# Patient Record
Sex: Female | Born: 1945 | Hispanic: No | Marital: Married | Smoking: Never smoker
Health system: Southern US, Community
[De-identification: ages and names within clinical notes are randomized; demographics above are authoritative.]

## PROBLEM LIST (undated history)

## (undated) DIAGNOSIS — E559 Vitamin D deficiency, unspecified: Secondary | ICD-10-CM

## (undated) DIAGNOSIS — Z8719 Personal history of other diseases of the digestive system: Secondary | ICD-10-CM

## (undated) DIAGNOSIS — N189 Chronic kidney disease, unspecified: Secondary | ICD-10-CM

## (undated) DIAGNOSIS — J309 Allergic rhinitis, unspecified: Secondary | ICD-10-CM

## (undated) DIAGNOSIS — G459 Transient cerebral ischemic attack, unspecified: Secondary | ICD-10-CM

## (undated) DIAGNOSIS — I639 Cerebral infarction, unspecified: Secondary | ICD-10-CM

## (undated) DIAGNOSIS — G1221 Amyotrophic lateral sclerosis: Secondary | ICD-10-CM

## (undated) DIAGNOSIS — K219 Gastro-esophageal reflux disease without esophagitis: Secondary | ICD-10-CM

## (undated) DIAGNOSIS — K635 Polyp of colon: Secondary | ICD-10-CM

## (undated) DIAGNOSIS — E785 Hyperlipidemia, unspecified: Secondary | ICD-10-CM

## (undated) DIAGNOSIS — Z923 Personal history of irradiation: Secondary | ICD-10-CM

## (undated) DIAGNOSIS — M1990 Unspecified osteoarthritis, unspecified site: Secondary | ICD-10-CM

## (undated) DIAGNOSIS — C801 Malignant (primary) neoplasm, unspecified: Secondary | ICD-10-CM

## (undated) DIAGNOSIS — R0600 Dyspnea, unspecified: Secondary | ICD-10-CM

## (undated) HISTORY — DX: Hyperlipidemia, unspecified: E78.5

## (undated) HISTORY — PX: BREAST SURGERY: SHX581

## (undated) HISTORY — DX: Vitamin D deficiency, unspecified: E55.9

## (undated) HISTORY — DX: Chronic kidney disease, unspecified: N18.9

## (undated) HISTORY — DX: Polyp of colon: K63.5

## (undated) HISTORY — PX: COLONOSCOPY: SHX5424

## (undated) HISTORY — DX: Allergic rhinitis, unspecified: J30.9

## (undated) HISTORY — PX: CHOLECYSTECTOMY: SHX55

## (undated) HISTORY — PX: APPENDECTOMY: SHX54

## (undated) HISTORY — DX: Malignant (primary) neoplasm, unspecified: C80.1

## (undated) HISTORY — DX: Transient cerebral ischemic attack, unspecified: G45.9

---

## 2004-12-24 HISTORY — PX: BREAST LUMPECTOMY: SHX2

## 2012-03-17 DIAGNOSIS — Z8669 Personal history of other diseases of the nervous system and sense organs: Secondary | ICD-10-CM | POA: Diagnosis not present

## 2012-03-17 DIAGNOSIS — Z0000 Encounter for general adult medical examination without abnormal findings: Secondary | ICD-10-CM | POA: Diagnosis not present

## 2012-03-17 DIAGNOSIS — E559 Vitamin D deficiency, unspecified: Secondary | ICD-10-CM | POA: Diagnosis not present

## 2012-03-17 DIAGNOSIS — Z136 Encounter for screening for cardiovascular disorders: Secondary | ICD-10-CM | POA: Diagnosis not present

## 2012-03-17 DIAGNOSIS — R209 Unspecified disturbances of skin sensation: Secondary | ICD-10-CM | POA: Diagnosis not present

## 2012-03-17 DIAGNOSIS — Z1211 Encounter for screening for malignant neoplasm of colon: Secondary | ICD-10-CM | POA: Diagnosis not present

## 2012-03-17 DIAGNOSIS — K219 Gastro-esophageal reflux disease without esophagitis: Secondary | ICD-10-CM | POA: Diagnosis not present

## 2012-03-17 DIAGNOSIS — M542 Cervicalgia: Secondary | ICD-10-CM | POA: Diagnosis not present

## 2012-03-17 DIAGNOSIS — J309 Allergic rhinitis, unspecified: Secondary | ICD-10-CM | POA: Diagnosis not present

## 2012-03-17 DIAGNOSIS — G459 Transient cerebral ischemic attack, unspecified: Secondary | ICD-10-CM | POA: Diagnosis not present

## 2012-04-18 DIAGNOSIS — M542 Cervicalgia: Secondary | ICD-10-CM | POA: Diagnosis not present

## 2012-04-18 DIAGNOSIS — M545 Low back pain: Secondary | ICD-10-CM | POA: Diagnosis not present

## 2012-04-25 DIAGNOSIS — M542 Cervicalgia: Secondary | ICD-10-CM | POA: Diagnosis not present

## 2012-04-29 DIAGNOSIS — M545 Low back pain: Secondary | ICD-10-CM | POA: Diagnosis not present

## 2012-05-01 DIAGNOSIS — M545 Low back pain: Secondary | ICD-10-CM | POA: Diagnosis not present

## 2012-05-02 DIAGNOSIS — M545 Low back pain: Secondary | ICD-10-CM | POA: Diagnosis not present

## 2012-05-05 DIAGNOSIS — M545 Low back pain: Secondary | ICD-10-CM | POA: Diagnosis not present

## 2012-05-07 DIAGNOSIS — M545 Low back pain: Secondary | ICD-10-CM | POA: Diagnosis not present

## 2012-05-12 DIAGNOSIS — M545 Low back pain: Secondary | ICD-10-CM | POA: Diagnosis not present

## 2012-05-16 DIAGNOSIS — M545 Low back pain: Secondary | ICD-10-CM | POA: Diagnosis not present

## 2012-05-16 DIAGNOSIS — M542 Cervicalgia: Secondary | ICD-10-CM | POA: Diagnosis not present

## 2012-05-20 DIAGNOSIS — M545 Low back pain: Secondary | ICD-10-CM | POA: Diagnosis not present

## 2012-05-23 DIAGNOSIS — M545 Low back pain: Secondary | ICD-10-CM | POA: Diagnosis not present

## 2012-05-27 DIAGNOSIS — M545 Low back pain: Secondary | ICD-10-CM | POA: Diagnosis not present

## 2012-05-30 DIAGNOSIS — M545 Low back pain: Secondary | ICD-10-CM | POA: Diagnosis not present

## 2012-05-30 DIAGNOSIS — M542 Cervicalgia: Secondary | ICD-10-CM | POA: Diagnosis not present

## 2012-07-29 DIAGNOSIS — M545 Low back pain: Secondary | ICD-10-CM | POA: Diagnosis not present

## 2012-11-06 DIAGNOSIS — Z23 Encounter for immunization: Secondary | ICD-10-CM | POA: Diagnosis not present

## 2012-12-26 DIAGNOSIS — G459 Transient cerebral ischemic attack, unspecified: Secondary | ICD-10-CM | POA: Diagnosis not present

## 2012-12-26 DIAGNOSIS — Z1331 Encounter for screening for depression: Secondary | ICD-10-CM | POA: Diagnosis not present

## 2012-12-26 DIAGNOSIS — R944 Abnormal results of kidney function studies: Secondary | ICD-10-CM | POA: Diagnosis not present

## 2012-12-26 DIAGNOSIS — J309 Allergic rhinitis, unspecified: Secondary | ICD-10-CM | POA: Diagnosis not present

## 2012-12-26 DIAGNOSIS — K219 Gastro-esophageal reflux disease without esophagitis: Secondary | ICD-10-CM | POA: Diagnosis not present

## 2012-12-26 DIAGNOSIS — E559 Vitamin D deficiency, unspecified: Secondary | ICD-10-CM | POA: Diagnosis not present

## 2012-12-26 DIAGNOSIS — M542 Cervicalgia: Secondary | ICD-10-CM | POA: Diagnosis not present

## 2012-12-26 DIAGNOSIS — D126 Benign neoplasm of colon, unspecified: Secondary | ICD-10-CM | POA: Diagnosis not present

## 2013-01-06 DIAGNOSIS — Z86010 Personal history of colonic polyps: Secondary | ICD-10-CM | POA: Diagnosis not present

## 2013-01-06 DIAGNOSIS — K7689 Other specified diseases of liver: Secondary | ICD-10-CM | POA: Diagnosis not present

## 2013-02-12 DIAGNOSIS — D126 Benign neoplasm of colon, unspecified: Secondary | ICD-10-CM | POA: Diagnosis not present

## 2013-02-12 DIAGNOSIS — K5730 Diverticulosis of large intestine without perforation or abscess without bleeding: Secondary | ICD-10-CM | POA: Diagnosis not present

## 2013-02-12 DIAGNOSIS — Z86010 Personal history of colonic polyps: Secondary | ICD-10-CM | POA: Diagnosis not present

## 2013-02-12 DIAGNOSIS — K5520 Angiodysplasia of colon without hemorrhage: Secondary | ICD-10-CM | POA: Diagnosis not present

## 2013-02-12 DIAGNOSIS — Z09 Encounter for follow-up examination after completed treatment for conditions other than malignant neoplasm: Secondary | ICD-10-CM | POA: Diagnosis not present

## 2013-04-01 ENCOUNTER — Other Ambulatory Visit: Payer: Self-pay | Admitting: Gastroenterology

## 2013-04-02 DIAGNOSIS — M722 Plantar fascial fibromatosis: Secondary | ICD-10-CM | POA: Diagnosis not present

## 2013-04-22 ENCOUNTER — Other Ambulatory Visit: Payer: Self-pay | Admitting: Gastroenterology

## 2013-04-22 DIAGNOSIS — L814 Other melanin hyperpigmentation: Secondary | ICD-10-CM

## 2013-04-22 DIAGNOSIS — K7689 Other specified diseases of liver: Secondary | ICD-10-CM

## 2013-04-27 ENCOUNTER — Other Ambulatory Visit: Payer: Self-pay

## 2013-04-30 ENCOUNTER — Ambulatory Visit
Admission: RE | Admit: 2013-04-30 | Discharge: 2013-04-30 | Disposition: A | Discharge status: Home / Self Care | Payer: Medicare Other | Source: Ambulatory Visit | Attending: Gastroenterology | Admitting: Gastroenterology

## 2013-04-30 DIAGNOSIS — K7689 Other specified diseases of liver: Secondary | ICD-10-CM

## 2013-04-30 DIAGNOSIS — L814 Other melanin hyperpigmentation: Secondary | ICD-10-CM

## 2013-04-30 IMAGING — US US ABDOMEN LIMITED
1 series · 14 of 25 positions shown · non-contrast
Comparison: None

CLINICAL DATA: Evaluate liver cysts.  History of cholecystectomy.

GALLBLADDER ULTRASOUND

[Series 1: us abdomen limited · 0.28mm/px · 14 of 49 slices shown]
[im 1/49]
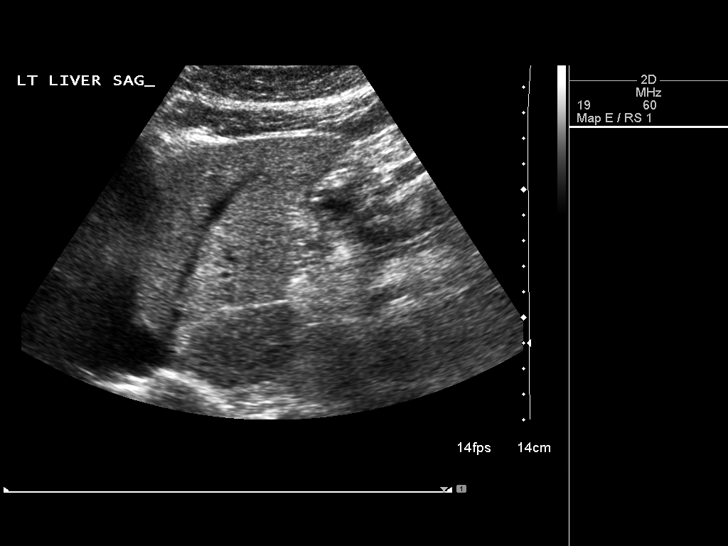
[im 5/49]
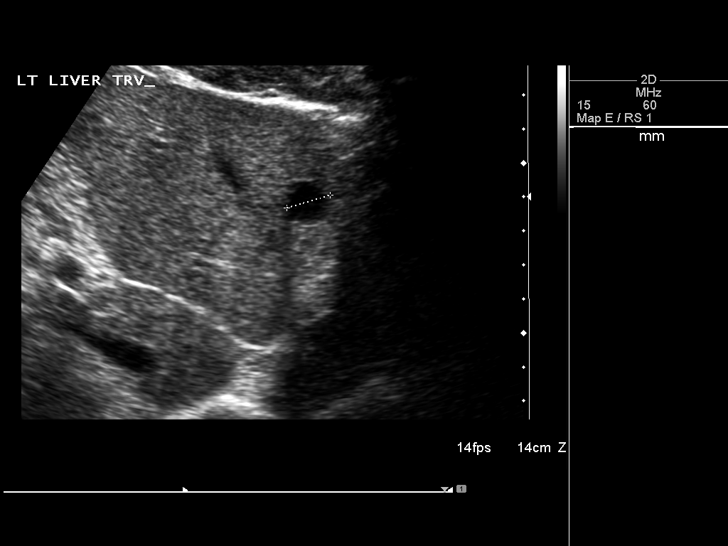
[im 9/49]
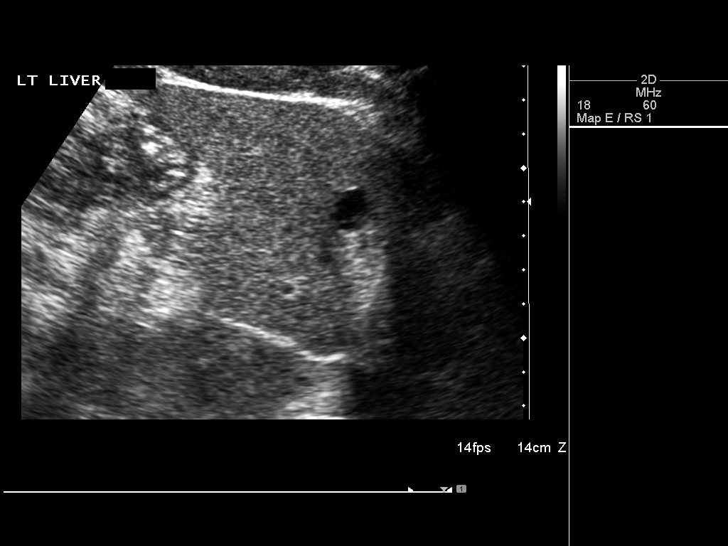
[im 13/49]
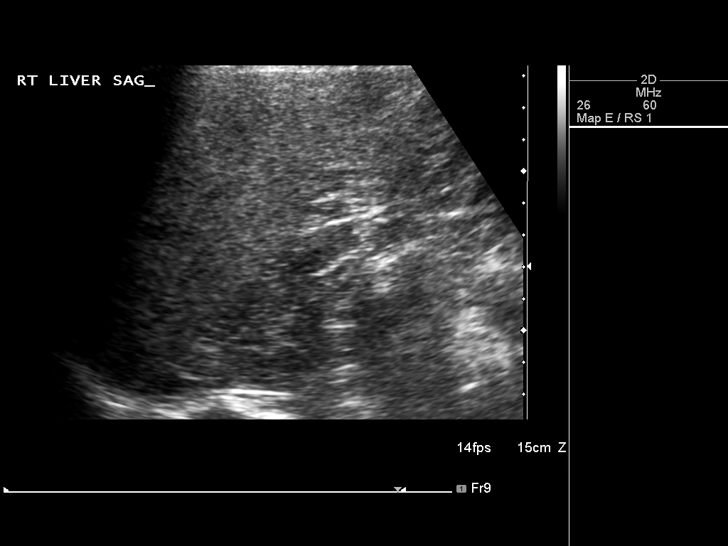
[im 17/49]
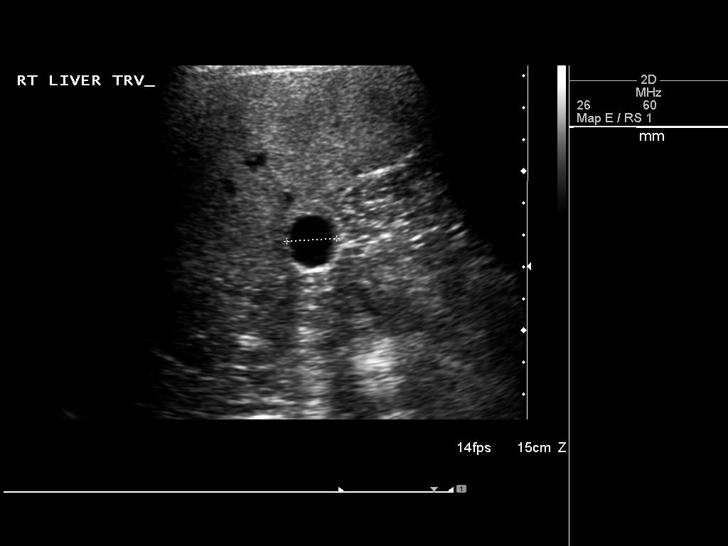
[im 19/49]
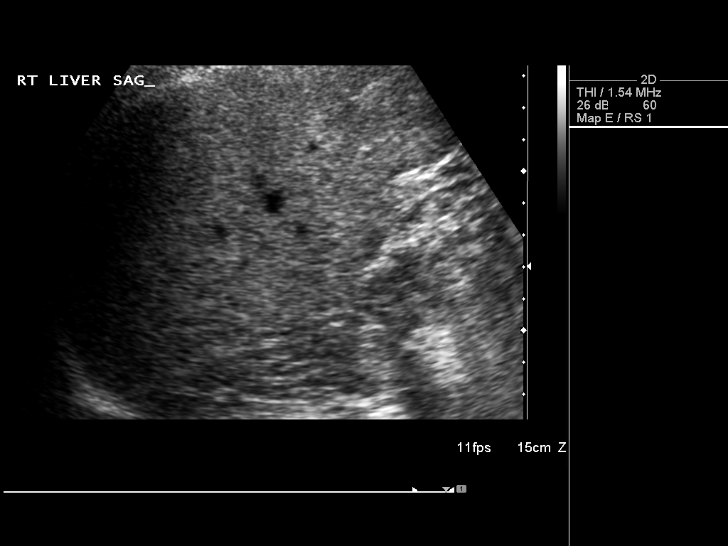
[im 23/49]
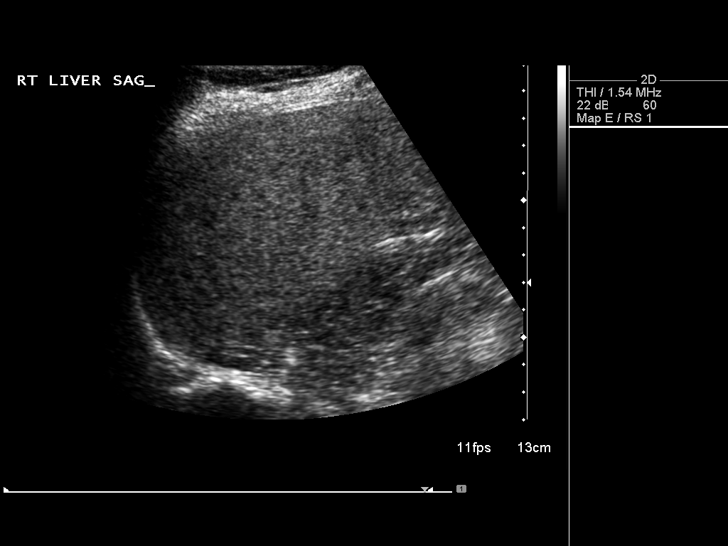
[im 27/49]
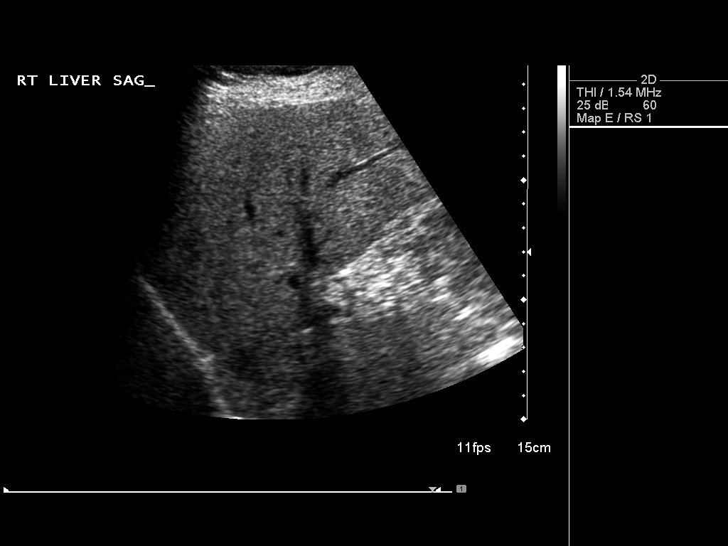
[im 31/49]
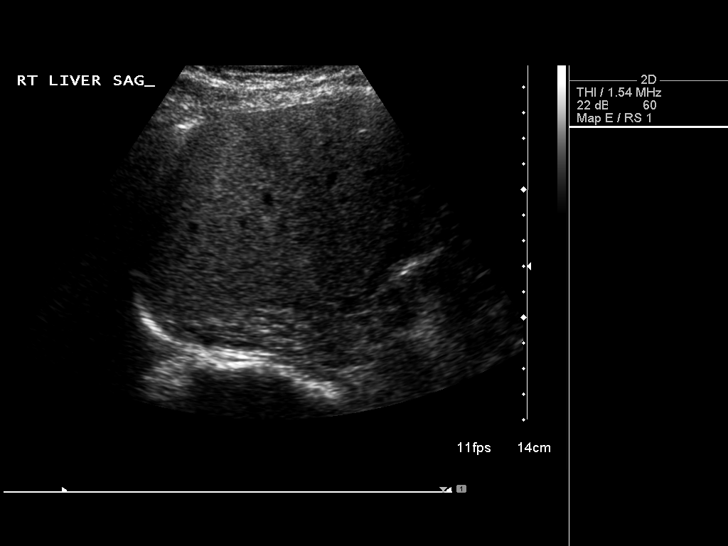
[im 33/49]
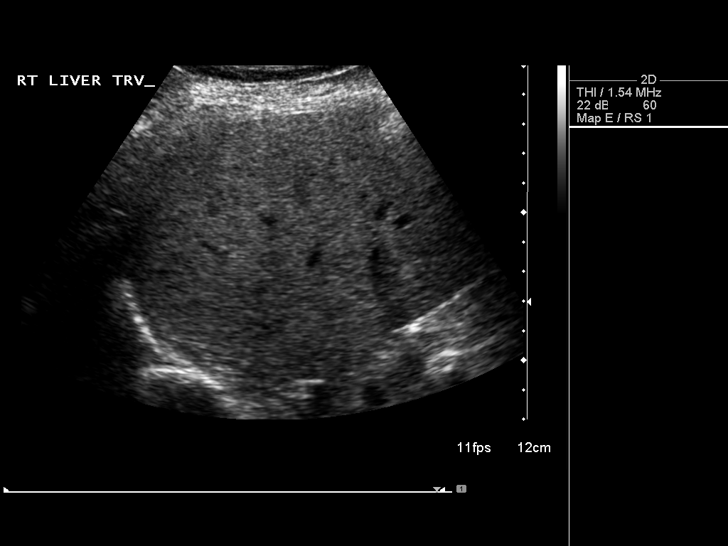
[im 37/49]
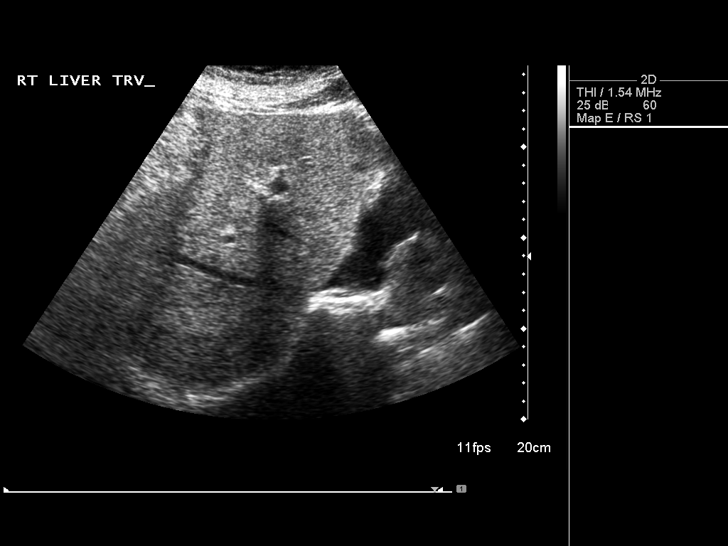
[im 41/49]
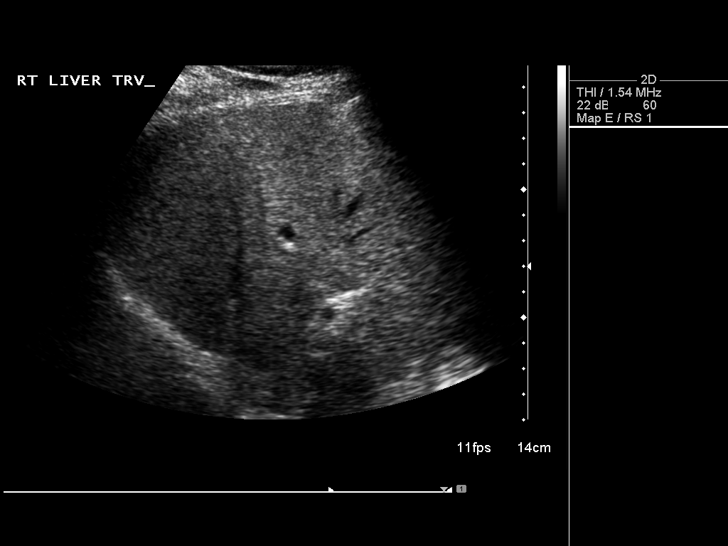
[im 45/49]
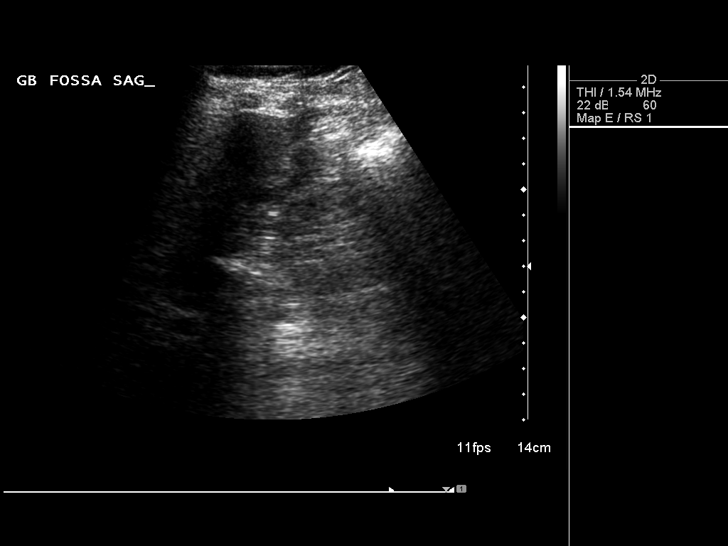
[im 49/49]
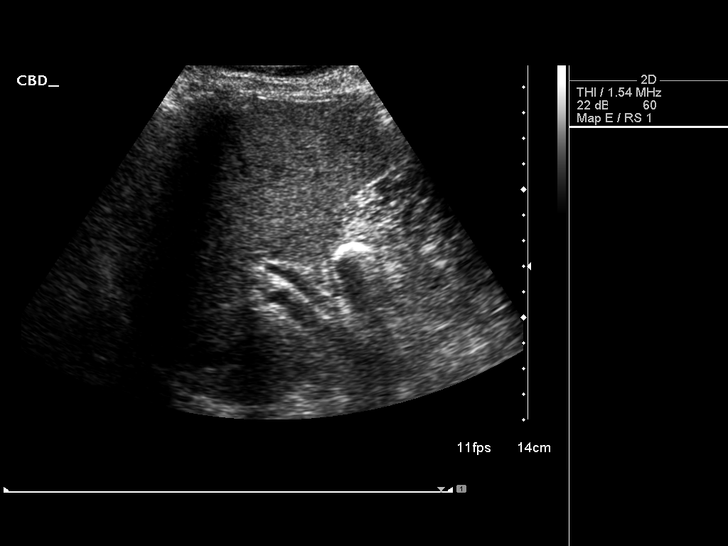

[14 of 25 positions shown; findings below may reference images not displayed]

FINDINGS: Gallbladder:  Prior cholecystectomy.

Common Bile Duct:  Within normal limits measuring 6.8 mm.

Liver:  Left hepatic lobe cyst measures 1.38 x 1.5 x 1.3 cm.
Within the right hepatic lobe there is a cyst measuring 1.8 x 1.9 x
1.6 cm.  There are no complicating features associated with these
cysts. Within normal limits in parenchymal echogenicity.
IMPRESSION: 1.  Liver cyst.
2.  Prior cholecystectomy.

## 2013-05-14 DIAGNOSIS — M722 Plantar fascial fibromatosis: Secondary | ICD-10-CM | POA: Diagnosis not present

## 2013-07-01 DIAGNOSIS — Z13820 Encounter for screening for osteoporosis: Secondary | ICD-10-CM | POA: Diagnosis not present

## 2013-07-01 DIAGNOSIS — Z8673 Personal history of transient ischemic attack (TIA), and cerebral infarction without residual deficits: Secondary | ICD-10-CM | POA: Diagnosis not present

## 2013-07-01 DIAGNOSIS — J309 Allergic rhinitis, unspecified: Secondary | ICD-10-CM | POA: Diagnosis not present

## 2013-07-01 DIAGNOSIS — E785 Hyperlipidemia, unspecified: Secondary | ICD-10-CM | POA: Diagnosis not present

## 2013-07-01 DIAGNOSIS — Z853 Personal history of malignant neoplasm of breast: Secondary | ICD-10-CM | POA: Diagnosis not present

## 2013-07-01 DIAGNOSIS — K219 Gastro-esophageal reflux disease without esophagitis: Secondary | ICD-10-CM | POA: Diagnosis not present

## 2013-07-01 DIAGNOSIS — E559 Vitamin D deficiency, unspecified: Secondary | ICD-10-CM | POA: Diagnosis not present

## 2013-08-04 DIAGNOSIS — M779 Enthesopathy, unspecified: Secondary | ICD-10-CM | POA: Diagnosis not present

## 2013-08-18 ENCOUNTER — Other Ambulatory Visit: Payer: Self-pay | Admitting: Nurse Practitioner

## 2013-08-18 ENCOUNTER — Other Ambulatory Visit (HOSPITAL_COMMUNITY)
Admission: RE | Admit: 2013-08-18 | Discharge: 2013-08-18 | Disposition: A | Discharge status: Home / Self Care | Payer: Medicare Other | Source: Ambulatory Visit | Attending: Obstetrics and Gynecology | Admitting: Obstetrics and Gynecology

## 2013-08-18 ENCOUNTER — Other Ambulatory Visit: Payer: Self-pay | Admitting: Obstetrics and Gynecology

## 2013-08-18 DIAGNOSIS — Z124 Encounter for screening for malignant neoplasm of cervix: Secondary | ICD-10-CM | POA: Diagnosis not present

## 2013-08-18 DIAGNOSIS — Z01419 Encounter for gynecological examination (general) (routine) without abnormal findings: Secondary | ICD-10-CM | POA: Diagnosis not present

## 2013-08-18 DIAGNOSIS — Z853 Personal history of malignant neoplasm of breast: Secondary | ICD-10-CM

## 2013-08-18 DIAGNOSIS — N644 Mastodynia: Secondary | ICD-10-CM

## 2013-08-18 DIAGNOSIS — Z9889 Other specified postprocedural states: Secondary | ICD-10-CM

## 2013-09-09 ENCOUNTER — Ambulatory Visit
Admission: RE | Admit: 2013-09-09 | Discharge: 2013-09-09 | Disposition: A | Discharge status: Home / Self Care | Payer: Medicare Other | Source: Ambulatory Visit | Attending: Obstetrics and Gynecology | Admitting: Obstetrics and Gynecology

## 2013-09-09 DIAGNOSIS — Z9889 Other specified postprocedural states: Secondary | ICD-10-CM

## 2013-09-09 DIAGNOSIS — Z853 Personal history of malignant neoplasm of breast: Secondary | ICD-10-CM | POA: Diagnosis not present

## 2013-09-09 DIAGNOSIS — N644 Mastodynia: Secondary | ICD-10-CM

## 2013-09-09 IMAGING — MG MM DIGITAL DIAGNOSTIC BILAT
5 series · 5 of 5 positions shown · non-contrast
Comparison: No comparison exams available.

***ADDENDUM*** CREATED: [DATE] [DATE]

The patient's prior films from [DATE] and [DATE] have
become available for comparison. There has been no change since the
prior exam. There is no mammographic evidence of malignancy in
either breast.
***END ADDENDUM*** SIGNED BY: NASARIO, M.D.
CLINICAL DATA: 66-year-old female with history of left breast
cancer post lumpectomy and radiation in [FJ].  The patient reports
nonfocal tenderness throughout the right breast.
DIGITAL DIAGNOSTIC BILATERAL MAMMOGRAM

[R CC (1 of 2)]
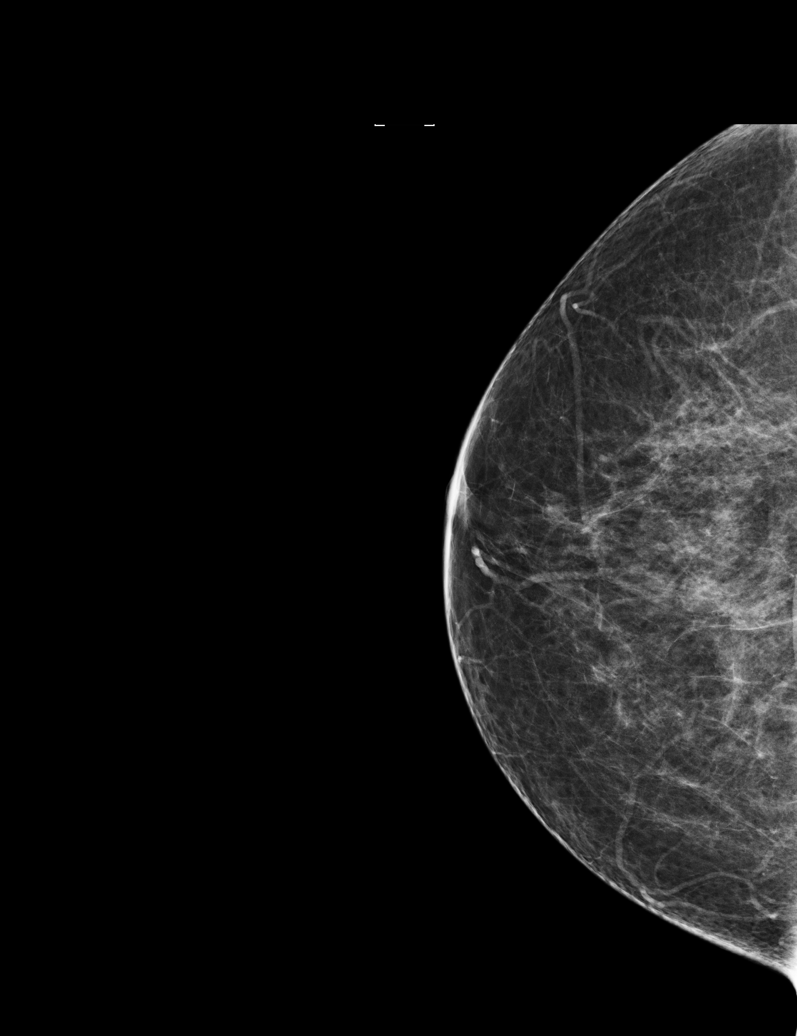

[R CC (2 of 2)]
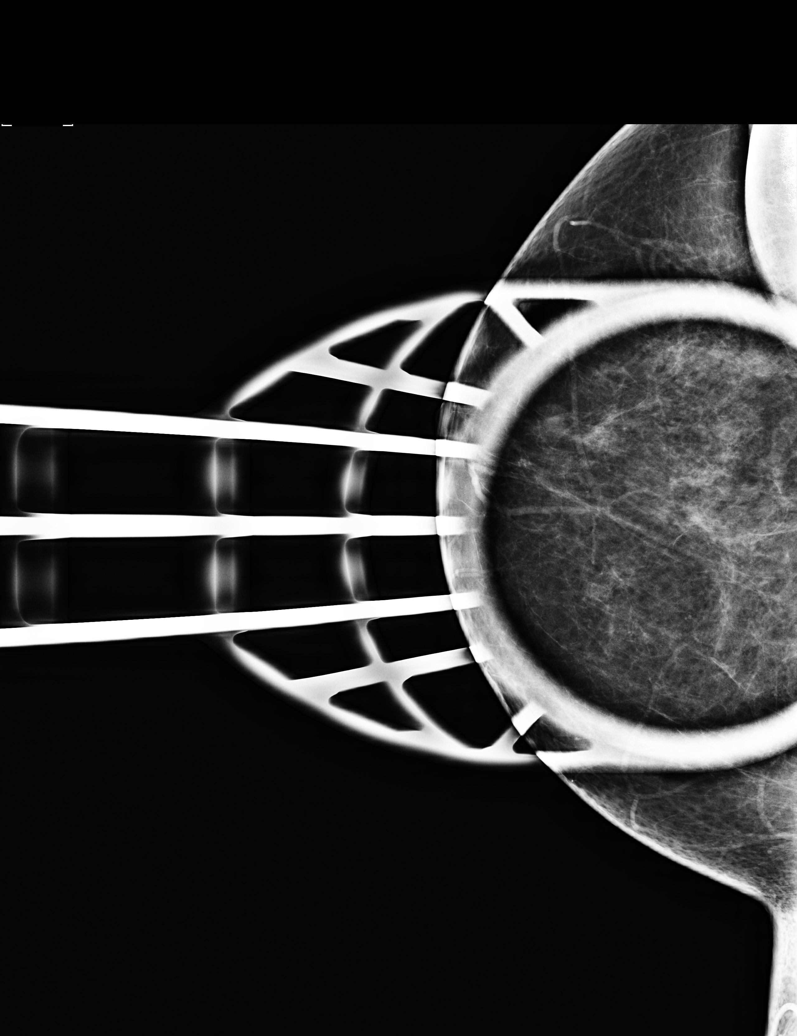

[L MLO]
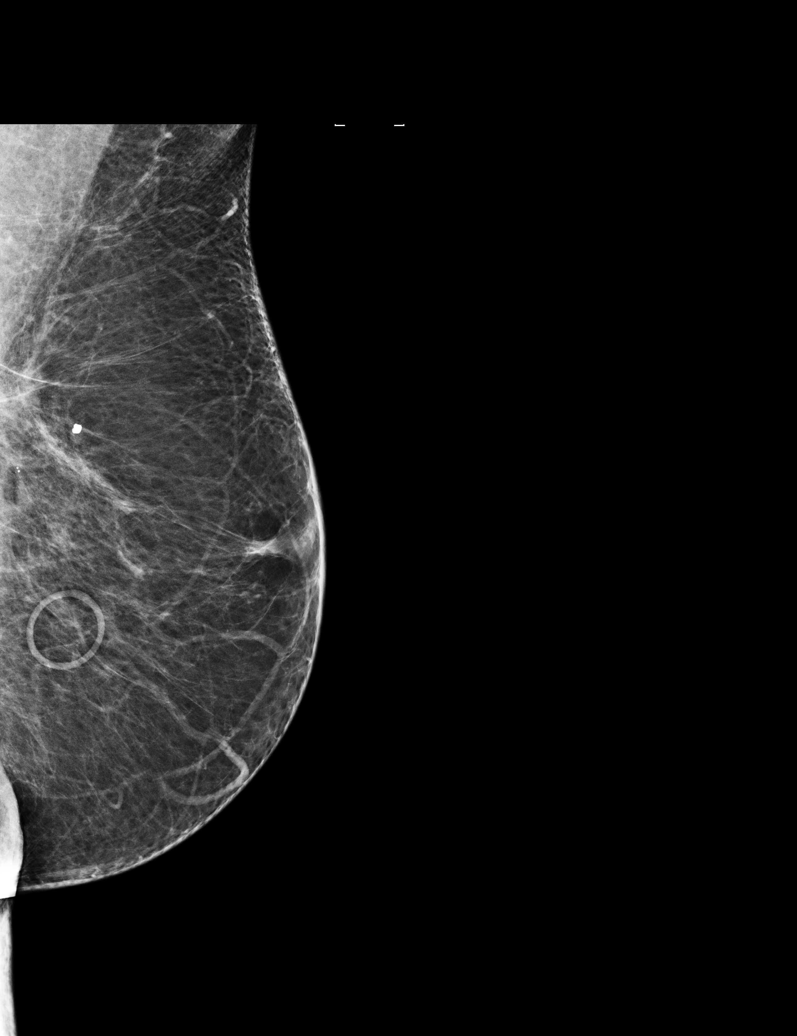

[R MLO]
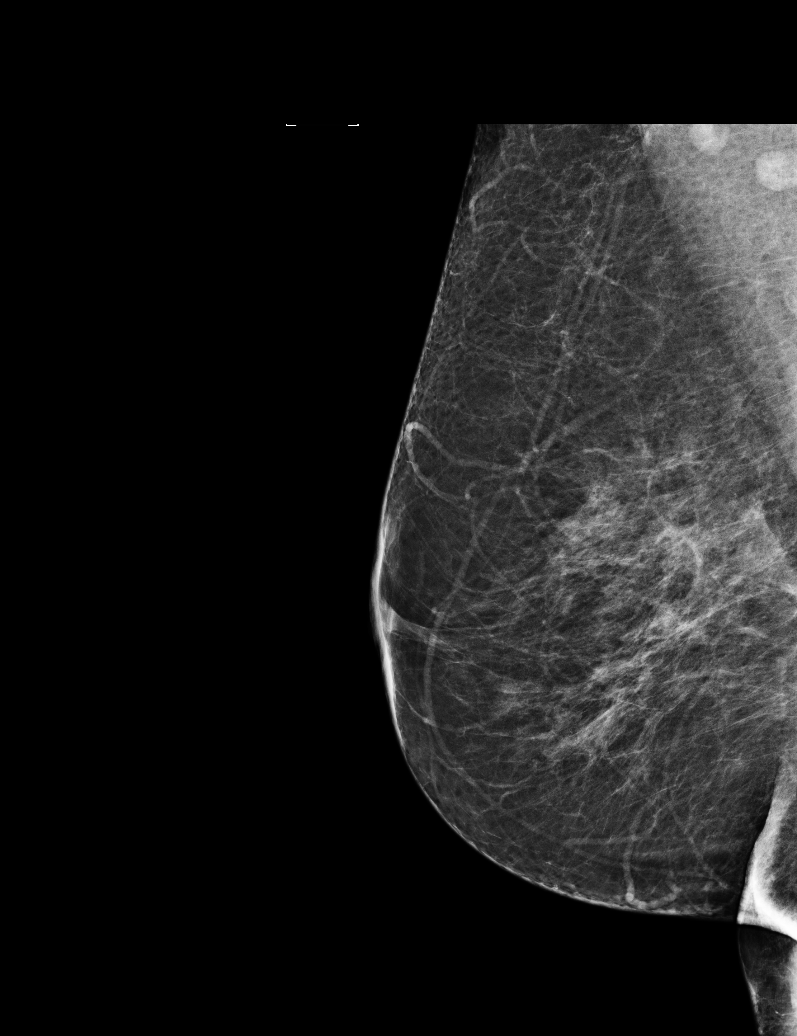

[L CC]
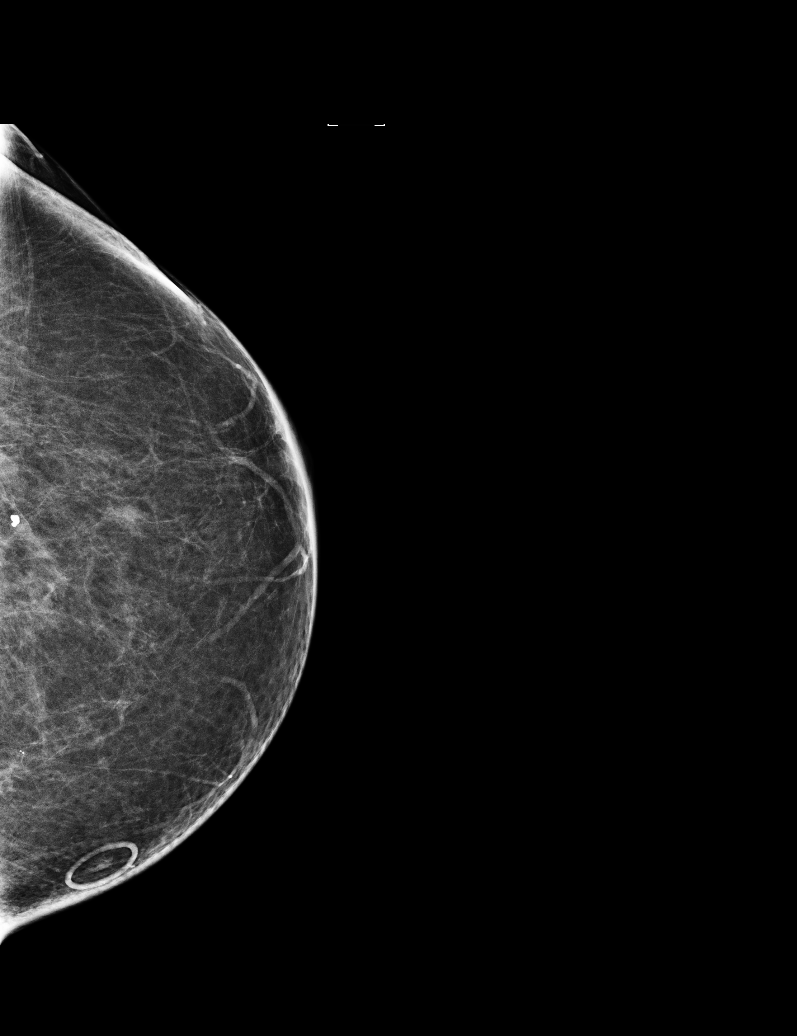

[5 of 5 positions shown; findings below may reference images not displayed]

FINDINGS: ACR Breast Density Category b:  There are scattered areas of
fibroglandular density.

There is density and architectural distortion in the superior left
breast consistent with a post lumpectomy scar.  An initially
questioned asymmetry in the right breast resolves on the additional
imaging compatible with superimposed breast tissue. There are no
suspicious masses or calcifications seen in either breast.

CAD/BLANK
IMPRESSION: Post lumpectomy changes in the left breast.  There is no
mammographic evidence of malignancy in either breast.

RECOMMENDATION:
1. Screening mammogram in one year. (Code:[FJ])
2. The patient's prior films have been requested for comparison and
if they become available an addendum will be made.

I have discussed the findings and recommendations with the patient.
Results were also provided in writing at the conclusion of the
visit.  If applicable, a reminder letter will be sent to the
patient regarding her next appointment.

BI-RADS CATEGORY 2:  Benign finding(s).

## 2013-09-17 DIAGNOSIS — Z780 Asymptomatic menopausal state: Secondary | ICD-10-CM | POA: Diagnosis not present

## 2013-10-09 DIAGNOSIS — Z23 Encounter for immunization: Secondary | ICD-10-CM | POA: Diagnosis not present

## 2013-10-26 DIAGNOSIS — E559 Vitamin D deficiency, unspecified: Secondary | ICD-10-CM | POA: Diagnosis not present

## 2013-10-26 DIAGNOSIS — Z1331 Encounter for screening for depression: Secondary | ICD-10-CM | POA: Diagnosis not present

## 2013-10-26 DIAGNOSIS — Z8673 Personal history of transient ischemic attack (TIA), and cerebral infarction without residual deficits: Secondary | ICD-10-CM | POA: Diagnosis not present

## 2013-10-26 DIAGNOSIS — K219 Gastro-esophageal reflux disease without esophagitis: Secondary | ICD-10-CM | POA: Diagnosis not present

## 2013-10-26 DIAGNOSIS — J309 Allergic rhinitis, unspecified: Secondary | ICD-10-CM | POA: Diagnosis not present

## 2013-10-26 DIAGNOSIS — Z853 Personal history of malignant neoplasm of breast: Secondary | ICD-10-CM | POA: Diagnosis not present

## 2013-10-26 DIAGNOSIS — E785 Hyperlipidemia, unspecified: Secondary | ICD-10-CM | POA: Diagnosis not present

## 2013-10-26 DIAGNOSIS — R0602 Shortness of breath: Secondary | ICD-10-CM | POA: Diagnosis not present

## 2013-11-08 ENCOUNTER — Encounter: Payer: Self-pay | Admitting: *Deleted

## 2013-11-08 ENCOUNTER — Encounter: Payer: Self-pay | Admitting: Interventional Cardiology

## 2013-11-08 DIAGNOSIS — G459 Transient cerebral ischemic attack, unspecified: Secondary | ICD-10-CM | POA: Insufficient documentation

## 2013-11-08 DIAGNOSIS — E785 Hyperlipidemia, unspecified: Secondary | ICD-10-CM | POA: Insufficient documentation

## 2013-11-08 DIAGNOSIS — J309 Allergic rhinitis, unspecified: Secondary | ICD-10-CM | POA: Insufficient documentation

## 2013-11-08 DIAGNOSIS — N189 Chronic kidney disease, unspecified: Secondary | ICD-10-CM | POA: Insufficient documentation

## 2013-11-08 DIAGNOSIS — K635 Polyp of colon: Secondary | ICD-10-CM | POA: Insufficient documentation

## 2013-11-08 DIAGNOSIS — E559 Vitamin D deficiency, unspecified: Secondary | ICD-10-CM | POA: Insufficient documentation

## 2013-11-09 ENCOUNTER — Encounter: Payer: Self-pay | Admitting: Interventional Cardiology

## 2013-11-09 ENCOUNTER — Ambulatory Visit (INDEPENDENT_AMBULATORY_CARE_PROVIDER_SITE_OTHER): Payer: Medicare Other | Admitting: Interventional Cardiology

## 2013-11-09 VITALS — BP 122/72 | HR 90 | Ht 65.0 in | Wt 177.0 lb

## 2013-11-09 DIAGNOSIS — R079 Chest pain, unspecified: Secondary | ICD-10-CM

## 2013-11-09 DIAGNOSIS — I6350 Cerebral infarction due to unspecified occlusion or stenosis of unspecified cerebral artery: Secondary | ICD-10-CM

## 2013-11-09 DIAGNOSIS — I639 Cerebral infarction, unspecified: Secondary | ICD-10-CM

## 2013-11-09 DIAGNOSIS — E785 Hyperlipidemia, unspecified: Secondary | ICD-10-CM

## 2013-11-09 NOTE — Patient Instructions (Signed)
Your physician has requested that you have en exercise stress myoview. For further information please visit [email or web address]. Please follow instruction sheet, as given.   

## 2013-11-09 NOTE — Progress Notes (Signed)
Patient ID: Yvonne Jordan, female   DOB: [birth year], 67 y.o.   MRN: [id number]     Patient ID: Yvonne Jordan MRN: [id number] DOB/AGE: June 21, 1946 67 y.o.   Referring Physician Dr. Azucena Cecil   Reason for Consultation Chest pain  HPI: 67 y/o who has a family h/o CAD.  Her sister just had a fatal MI.  The patient has had neck pain radiating to the right arm, about once a month.  It has lasted hours.She went to sleep with it and it was gone when she woke.  She has DOE with walking up one flight of stairs.  She has trouble carrying groceries into the house, due to [facility]  Not related to any pain in the chest or neck.  She had a stress test a few years ago and it was borderline.  SHOB lasts until she rests.     Current Outpatient Prescriptions  Medication Sig Dispense Refill  . Ascorbic Acid (VITAMIN C) 1000 MG tablet Take 1,000 mg by mouth daily.      . cetirizine (ZYRTEC) 10 MG tablet Take 10 mg by mouth daily.      . Cholecalciferol (VITAMIN D3) 2000 UNITS TABS Take by mouth.      . clopidogrel (PLAVIX) 75 MG tablet Take 75 mg by mouth daily with breakfast.      . Cyanocobalamin (VITAMIN B12 PO) Take 5,000 mcg by mouth.      . Multiple Vitamin (MULTIVITAMIN) capsule Take 1 capsule by mouth daily.      . pantoprazole (PROTONIX) 40 MG tablet Take 40 mg by mouth daily. For 30 days       No current facility-administered medications for this visit.   Past Medical History  Diagnosis Date  . TIA (transient ischemic attack)   . Vitamin D deficiency   . Allergic rhinitis   . CKD (chronic kidney disease)   . Colon polyp   . Hyperlipidemia     Family History  Problem Relation Age of Onset  . Cancer Sister   . Heart attack Sister   . Heart disease Father   . Stroke Father   . Heart disease Maternal Uncle   . Heart disease Maternal Uncle   . Heart disease Maternal Uncle   . Stroke Mother     History   Social History  . Marital Status: Married    Spouse Name: N/A    Number of Children:  N/A  . Years of Education: N/A   Occupational History  . Not on file.   Social History Main Topics  . Smoking status: Never Smoker   . Smokeless tobacco: Not on file  . Alcohol Use: Not on file  . Drug Use: Not on file  . Sexual Activity: Not on file   Other Topics Concern  . Not on file   Social History Narrative  . No narrative on file    Past Surgical History  Procedure Laterality Date  . Breast surgery      2006...treated with Tamoxifen for 5 years  . Cholecystectomy        (Not in a hospital admission)  Review of systems complete and found to be negative unless listed above .  No nausea, vomiting.  No fever chills, No focal weakness,  No palpitations.  Physical Exam: Filed Vitals:   11/09/13 1451  BP: 122/72  Pulse: 90    Weight: 177 lb (80.287 kg)  Physical exam:  [place]/AT EOMI No JVD, No carotid bruit RRR S1S2  No  wheezing Soft. NT, nondistended No edema. No focal motor or sensory deficits Normal affect  Labs:   No results found for this basename: WBC, HGB, HCT, MCV, PLT   No results found for this basename: NA, K, CL, CO2, BUN, CREATININE, CALCIUM, LABALBU, PROT, BILITOT, ALKPHOS, ALT, AST, GLUCOSE,  in the last 168 hours No results found for this basename: CKTOTAL, CKMB, CKMBINDEX, TROPONINI    No results found for this basename: CHOL   No results found for this basename: HDL   No results found for this basename: LDLCALC   No results found for this basename: TRIG   No results found for this basename: CHOLHDL   No results found for this basename: LDLDIRECT       EKG: NSR, NSST changes inferiorly  ASSESSMENT AND PLAN: Chest pain, TIA, high choleterol Chest pain: Several atypical features. She does have risk factors for atherosclerosis. Her ECG would not be amenable to exercise treadmill testing due to baseline changes in the inferior leads with ST depressions downsloping. We'll plan for nuclear stress test.  TIA: She is taking clopidogrel  because of this prior event. Given this, her LDL target should be less than 100.  Hyperlipidemia: LDL 141. I would strongly consider lipid-lowering therapy for this patient. Atorvastatin 10 mg daily may be sufficient to get her LDL below 100.  Signed:   Fredric Mare, MD, [facility] 11/09/2013, 3:24 PM

## 2013-12-10 ENCOUNTER — Encounter: Payer: Self-pay | Admitting: Cardiology

## 2013-12-10 ENCOUNTER — Ambulatory Visit (HOSPITAL_COMMUNITY): Payer: Medicare Other | Attending: Interventional Cardiology | Admitting: Radiology

## 2013-12-10 VITALS — BP 133/69 | HR 80 | Ht 65.0 in | Wt 175.0 lb

## 2013-12-10 DIAGNOSIS — Z8673 Personal history of transient ischemic attack (TIA), and cerebral infarction without residual deficits: Secondary | ICD-10-CM | POA: Diagnosis not present

## 2013-12-10 DIAGNOSIS — I639 Cerebral infarction, unspecified: Secondary | ICD-10-CM

## 2013-12-10 DIAGNOSIS — R079 Chest pain, unspecified: Secondary | ICD-10-CM | POA: Diagnosis not present

## 2013-12-10 DIAGNOSIS — R0602 Shortness of breath: Secondary | ICD-10-CM | POA: Diagnosis not present

## 2013-12-10 MED ORDER — TECHNETIUM TC 99M SESTAMIBI GENERIC - CARDIOLITE
33.0000 | Freq: Once | INTRAVENOUS | Status: AC | PRN
  Administered 2013-12-10: 33 via INTRAVENOUS

## 2013-12-10 MED ORDER — TECHNETIUM TC 99M SESTAMIBI GENERIC - CARDIOLITE
11.0000 | Freq: Once | INTRAVENOUS | Status: AC | PRN
  Administered 2013-12-10: 11 via INTRAVENOUS

## 2013-12-10 NOTE — Progress Notes (Signed)
[facility] SITE 3 NUCLEAR MED [address] [place], [place] [address] [phone]    Cardiology Nuclear Med Study  Yvonne Jordan is a 67 y.o. female     MRN : [id number]     DOB: 1946/09/24  Procedure Date: 12/10/2013  Nuclear Med Background Indication for Stress Test:  Evaluation for Ischemia History:  No known CAD, GXT several ago (normal) Cardiac Risk Factors: Family History - CAD, Lipids and TIA  Symptoms:  DOE   Nuclear Pre-Procedure Caffeine/Decaff Intake:  None NPO After: 7:00pm   Lungs:  clear O2 Sat: 100% on room air. IV 0.9% NS with Angio Cath:  22g  IV Site: R Hand  IV Started by:  Bonnita Levan, RN  Chest Size (in):  34 Cup Size: C  Height: 5\' 5"  (1.651 m)  Weight:  175 lb (79.379 kg)  BMI:  Body mass index is 29.12 kg/(m^2). Tech Comments:  N/A    Nuclear Med Study 1 or 2 day study: 1 day  Stress Test Type:  Stress  Reading MD: Lance Muss, MD  Order Authorizing Provider:  Lance Muss, MD  Resting Radionuclide: Technetium 37m Sestamibi  Resting Radionuclide Dose: 11.0 mCi   Stress Radionuclide:  Technetium 106m Sestamibi  Stress Radionuclide Dose: 33.0 mCi           Stress Protocol Rest HR: 80 Stress HR: 148  Rest BP: 133/69 Stress BP: 183/81  Exercise Time (min): n/a METS: n/a           Dose of Adenosine (mg):  n/a Dose of Lexiscan: n/a mg  Dose of Atropine (mg): n/a Dose of Dobutamine: n/a mcg/kg/min (at max HR)  Stress Test Technologist: Nelson Chimes, BS-ES  Nuclear Technologist:  Domenic Polite, CNMT     Rest Procedure:  Myocardial perfusion imaging was performed at rest 45 minutes following the intravenous administration of Technetium 41m Sestamibi. Rest ECG: NSR - Normal EKG  Stress Procedure:  The patient exercised on the treadmill utilizing the Bruce Protocol for 5:00 minutes. The patient stopped due to SOB and denied any chest pain.  Technetium 53m Sestamibi was injected at peak exercise and myocardial perfusion  imaging was performed after a brief delay.  Held stage I due to patient being very SOB.  Stress ECG: No significant change from baseline ECG  QPS Raw Data Images:  Normal; no motion artifact; normal heart/lung ratio. Stress Images:  Normal homogeneous uptake in all areas of the myocardium. Rest Images:  Normal homogeneous uptake in all areas of the myocardium. Subtraction (SDS):  No evidence of ischemia. Transient Ischemic Dilatation (Normal <1.22):  1.02 Lung/Heart Ratio (Normal <0.45):  0.25  Quantitative Gated Spect Images QGS EDV:  58 ml QGS ESV:  20 ml  Impression Exercise Capacity:  Fair exercise capacity. BP Response:  Hypertensive blood pressure response. Clinical Symptoms:  There is dyspnea. ECG Impression:  No significant ST segment change suggestive of ischemia. Comparison with Prior Nuclear Study: No images to compare  Overall Impression:  Low risk stress nuclear study . No clear evidence of ischemia..  LV Ejection Fraction: 65%.  LV Wall Motion:  NL LV Function; NL Wall Motion  Corky Crafts., MD, [facility]

## 2014-02-23 DIAGNOSIS — Z853 Personal history of malignant neoplasm of breast: Secondary | ICD-10-CM | POA: Diagnosis not present

## 2014-02-23 DIAGNOSIS — Z8673 Personal history of transient ischemic attack (TIA), and cerebral infarction without residual deficits: Secondary | ICD-10-CM | POA: Diagnosis not present

## 2014-02-23 DIAGNOSIS — K219 Gastro-esophageal reflux disease without esophagitis: Secondary | ICD-10-CM | POA: Diagnosis not present

## 2014-02-23 DIAGNOSIS — E559 Vitamin D deficiency, unspecified: Secondary | ICD-10-CM | POA: Diagnosis not present

## 2014-02-23 DIAGNOSIS — N183 Chronic kidney disease, stage 3 unspecified: Secondary | ICD-10-CM | POA: Diagnosis not present

## 2014-02-23 DIAGNOSIS — J309 Allergic rhinitis, unspecified: Secondary | ICD-10-CM | POA: Diagnosis not present

## 2014-02-23 DIAGNOSIS — Z23 Encounter for immunization: Secondary | ICD-10-CM | POA: Diagnosis not present

## 2014-02-23 DIAGNOSIS — E785 Hyperlipidemia, unspecified: Secondary | ICD-10-CM | POA: Diagnosis not present

## 2014-02-23 DIAGNOSIS — Z0000 Encounter for general adult medical examination without abnormal findings: Secondary | ICD-10-CM | POA: Diagnosis not present

## 2014-03-03 ENCOUNTER — Telehealth: Payer: Self-pay | Admitting: Interventional Cardiology

## 2014-03-03 DIAGNOSIS — R609 Edema, unspecified: Secondary | ICD-10-CM

## 2014-03-03 NOTE — Telephone Encounter (Signed)
Went over results with Yvonne Jordan again and faxed results to Dr. [name]. Yvonne Jordan has swelling in her right leg and ankle. Yvonne Jordan cant even wear knee highs on her right leg because it hurts. She has pain in this leg as well. Yvonne Jordan said that Dr. Irish Lack said the circulation in her right leg was not good. Her left leg is fine.

## 2014-03-03 NOTE — Telephone Encounter (Signed)
New message          Pt would like stress test results from December 2014 and also requests that a copy be sent to her primary care office.

## 2014-03-03 NOTE — Telephone Encounter (Signed)
Ok to check LE venous Doppler on swollen leg.,

## 2014-03-04 NOTE — Telephone Encounter (Signed)
Doppler scheduled for 3:30pm and pt notified if swelling gets worse she should go to ED. She states she should be fine because this has been going on for over a month now. FYI to Dr. Irish Lack.

## 2014-03-04 NOTE — Telephone Encounter (Signed)
LE Venous ordered, trying to work pt in today for study.

## 2014-03-04 NOTE — Telephone Encounter (Signed)
Spoke with pt and she is out of town until Saturday. She states swelling is better since elevating leg last night. Pt will come for doppler on Monday.

## 2014-03-08 ENCOUNTER — Encounter (HOSPITAL_COMMUNITY): Payer: Medicare Other

## 2014-03-15 ENCOUNTER — Ambulatory Visit (HOSPITAL_COMMUNITY): Payer: Medicare Other | Attending: Cardiology | Admitting: Cardiology

## 2014-03-15 DIAGNOSIS — R609 Edema, unspecified: Secondary | ICD-10-CM | POA: Diagnosis not present

## 2014-03-15 DIAGNOSIS — M79609 Pain in unspecified limb: Secondary | ICD-10-CM | POA: Diagnosis not present

## 2014-03-15 DIAGNOSIS — R0602 Shortness of breath: Secondary | ICD-10-CM | POA: Insufficient documentation

## 2014-03-15 DIAGNOSIS — M7989 Other specified soft tissue disorders: Secondary | ICD-10-CM

## 2014-03-15 NOTE — Progress Notes (Signed)
Lower venous duplex bilateral complete 

## 2014-06-01 ENCOUNTER — Encounter: Payer: Self-pay | Admitting: Podiatry

## 2014-06-01 ENCOUNTER — Ambulatory Visit (INDEPENDENT_AMBULATORY_CARE_PROVIDER_SITE_OTHER): Payer: Medicare Other | Admitting: Podiatry

## 2014-06-01 VITALS — BP 109/68 | HR 100 | Resp 16

## 2014-06-01 DIAGNOSIS — M2040 Other hammer toe(s) (acquired), unspecified foot: Secondary | ICD-10-CM

## 2014-06-01 DIAGNOSIS — Q828 Other specified congenital malformations of skin: Secondary | ICD-10-CM

## 2014-06-01 DIAGNOSIS — M898X9 Other specified disorders of bone, unspecified site: Secondary | ICD-10-CM

## 2014-06-01 NOTE — Progress Notes (Signed)
She presents today for followup of her fourth toe of her left foot. She states it hurts nothing to ready for surgical fix.  Objective: Pulses are palpable. No change in her previous physical exam. Adductovarus rotated fourth toe resulting in medial reactive hyperkeratosis overlying an exostosis to the medial base of the distal phalanx of this toe.  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  Plan: We discussed the etiology pathology conservative versus surgical therapies at this point she would like to have this surgically corrected. We went over consent form today which discusses thoroughly the procedures performed which is going to be a arthroplasty at the DIPJ fourth digit left foot with an exostectomy medial aspect of that toe. She understands this and is amenable to it. We discussed possible postop complications which may include but are not limited to possible postop pain bleeding swelling infection and need for further surgery she understands that is amenable to it will followup with [place] in the near future for surgery.

## 2014-06-01 NOTE — Patient Instructions (Signed)
Pre-Operative Instructions  Congratulations, you have decided to take an important step to improving your quality of life.  You can be assured that the doctors of [facility] will be with you every step of the way.  1. Plan to be at the surgery center/hospital at least 1 (one) hour prior to your scheduled time unless otherwise directed by the surgical center/hospital staff.  You must have a responsible adult accompany you, remain during the surgery and drive you home.  Make sure you have directions to the surgical center/hospital and know how to get there on time. 2. For hospital based surgery you will need to obtain a history and physical form from your family physician within 1 month prior to the date of surgery- we will give you a form for you primary physician.  3. We make every effort to accommodate the date you request for surgery.  There are however, times where surgery dates or times have to be moved.  We will contact you as soon as possible if a change in schedule is required.   4. No Aspirin/Ibuprofen for one week before surgery.  If you are on aspirin, any non-steroidal anti-inflammatory medications (Mobic, Aleve, Ibuprofen) you should stop taking it 7 days prior to your surgery.  You make take Tylenol  For pain prior to surgery.  5. Medications- If you are taking daily heart and blood pressure medications, seizure, reflux, allergy, asthma, anxiety, pain or diabetes medications, make sure the surgery center/hospital is aware before the day of surgery so they may notify you which medications to take or avoid the day of surgery. 6. No food or drink after midnight the night before surgery unless directed otherwise by surgical center/hospital staff. 7. No alcoholic beverages 24 hours prior to surgery.  No smoking 24 hours prior to or 24 hours after surgery. 8. Wear loose pants or shorts- loose enough to fit over bandages, boots, and casts. 9. No slip on shoes, sneakers are best. 10. Bring  your boot with you to the surgery center/hospital.  Also bring crutches or a walker if your physician has prescribed it for you.  If you do not have this equipment, it will be provided for you after surgery. 11. If you have not been contracted by the surgery center/hospital by the day before your surgery, call to confirm the date and time of your surgery. 12. Leave-time from work may vary depending on the type of surgery you have.  Appropriate arrangements should be made prior to surgery with your employer. 13. Prescriptions will be provided immediately following surgery by your doctor.  Have these filled as soon as possible after surgery and take the medication as directed. 14. Remove nail polish on the operative foot. 15. Wash the night before surgery.  The night before surgery wash the foot and leg well with the antibacterial soap provided and water paying special attention to beneath the toenails and in between the toes.  Rinse thoroughly with water and dry well with a towel.  Perform this wash unless told not to do so by your physician.  Enclosed: 1 Ice pack (please put in freezer the night before surgery)   1 Hibiclens skin cleaner   Pre-op Instructions  If you have any questions regarding the instructions, do not hesitate to call our office.  [place]: [address] [place], [place] [address] [phone]  [place]: [address] [place], [place] [address] [phone]  [place]: [address]  [place],  [address] [phone]  Dr. Richard   Tuchman DPM, Dr. Norman Regal DPM Dr. Richard Sikora DPM, Dr. M. Todd Hyatt DPM, Dr. Kathryn Egerton DPM 

## 2014-06-03 ENCOUNTER — Ambulatory Visit: Payer: Medicare Other | Admitting: Podiatry

## 2014-06-04 ENCOUNTER — Encounter: Payer: Self-pay | Admitting: *Deleted

## 2014-06-22 ENCOUNTER — Telehealth: Payer: Self-pay | Admitting: Interventional Cardiology

## 2014-06-22 NOTE — Telephone Encounter (Signed)
Spoke with pt and she will be having minor surgery on one of her toes. She will put under general anesthesia.

## 2014-06-22 NOTE — Telephone Encounter (Signed)
Follow up:        Pt needs surgical clearce faxed to  Dr [name]  [phone]  procedure date 7/31 please.

## 2014-06-23 NOTE — Telephone Encounter (Signed)
OK to hold Plavix 5 days.

## 2014-06-23 NOTE — Telephone Encounter (Signed)
Pt notified. Faxed to Dr. [name]. She also states that Dr. [name] told her to stop plavix 5 days prior. FYI.

## 2014-06-23 NOTE — Telephone Encounter (Signed)
No further cardiac evaluation needed before surgery.

## 2014-06-23 NOTE — Telephone Encounter (Signed)
Noted. Pt already aware. Faxed to Dr. [name].

## 2014-07-22 ENCOUNTER — Other Ambulatory Visit: Payer: Self-pay | Admitting: Podiatry

## 2014-07-22 ENCOUNTER — Telehealth: Payer: Self-pay | Admitting: *Deleted

## 2014-07-22 MED ORDER — OXYCODONE-ACETAMINOPHEN 10-325 MG PO TABS: 1.0000 | ORAL_TABLET | Freq: Four times a day (QID) | ORAL | Status: DC | PRN

## 2014-07-22 MED ORDER — CEPHALEXIN 500 MG PO CAPS: 500.0000 mg | ORAL_CAPSULE | Freq: Three times a day (TID) | ORAL | Status: DC

## 2014-07-22 MED ORDER — PROMETHAZINE HCL 25 MG PO TABS: 25.0000 mg | ORAL_TABLET | Freq: Three times a day (TID) | ORAL | Status: DC | PRN

## 2014-07-22 NOTE — Telephone Encounter (Signed)
Calling about procedure having tomorrow.  One of my papers said to bring things you gave me plus the thing for the foot.  I don't know what they're talking about.

## 2014-07-22 NOTE — Telephone Encounter (Signed)
I returned her call and asked how I could help her.  She stated her husband had called found out the answer.  They said I would receive the boot at the surgical center.  I told her she will probably be put into a surgical shoe instead of a boot.  She stated that's even better.

## 2014-07-23 DIAGNOSIS — M898X9 Other specified disorders of bone, unspecified site: Secondary | ICD-10-CM | POA: Diagnosis not present

## 2014-07-23 DIAGNOSIS — K219 Gastro-esophageal reflux disease without esophagitis: Secondary | ICD-10-CM | POA: Diagnosis not present

## 2014-07-23 DIAGNOSIS — M2040 Other hammer toe(s) (acquired), unspecified foot: Secondary | ICD-10-CM | POA: Diagnosis not present

## 2014-07-27 ENCOUNTER — Telehealth: Payer: Self-pay

## 2014-07-27 NOTE — Telephone Encounter (Signed)
No answer, left message

## 2014-07-29 ENCOUNTER — Encounter: Payer: Self-pay | Admitting: Podiatry

## 2014-07-29 ENCOUNTER — Ambulatory Visit (INDEPENDENT_AMBULATORY_CARE_PROVIDER_SITE_OTHER): Payer: Medicare Other | Admitting: Podiatry

## 2014-07-29 ENCOUNTER — Ambulatory Visit (INDEPENDENT_AMBULATORY_CARE_PROVIDER_SITE_OTHER): Payer: Medicare Other

## 2014-07-29 VITALS — BP 123/74 | HR 84 | Temp 97.7°F | Resp 16

## 2014-07-29 DIAGNOSIS — M2042 Other hammer toe(s) (acquired), left foot: Secondary | ICD-10-CM

## 2014-07-29 DIAGNOSIS — M2040 Other hammer toe(s) (acquired), unspecified foot: Secondary | ICD-10-CM | POA: Diagnosis not present

## 2014-07-29 DIAGNOSIS — Z9889 Other specified postprocedural states: Secondary | ICD-10-CM

## 2014-07-29 NOTE — Progress Notes (Signed)
She presents today for followup of a hammertoe repair fourth digit left foot x1 week date of surgery was July 31 thousand and 15. She denies fever chills nausea vomiting muscle aches and pains.  Objective: Vital signs are stable she is alert and oriented x3. Pulses are palpable left lower extremity after dry sterile dressing was removed. This demonstrates no erythema edema cellulitis drainage or odor. Radiographic evaluation demonstrates complete exostectomy arthroplasty fourth digit left foot.  Assessment: Well-healing surgical toe fourth left.  Plan: Redressed today dressed a compressive dressing continue the use of the Darco shoe continue to keep it dry. Followup with me in one week for suture removal.

## 2014-07-30 ENCOUNTER — Encounter: Payer: Self-pay | Admitting: Podiatry

## 2014-07-30 NOTE — Progress Notes (Signed)
Dr Milinda Pointer performed a left 4th hammertoe repair and exostectomy on 07/23/14

## 2014-08-05 ENCOUNTER — Encounter: Payer: Self-pay | Admitting: Podiatry

## 2014-08-05 ENCOUNTER — Ambulatory Visit (INDEPENDENT_AMBULATORY_CARE_PROVIDER_SITE_OTHER): Payer: Medicare Other | Admitting: Podiatry

## 2014-08-05 VITALS — BP 129/81 | HR 88 | Resp 15 | Ht 65.0 in | Wt 170.0 lb

## 2014-08-05 DIAGNOSIS — Z9889 Other specified postprocedural states: Secondary | ICD-10-CM

## 2014-08-06 NOTE — Progress Notes (Signed)
She presents today 2 weeks status post hammertoe repair matrixectomy fourth digit of her left foot. She states she's doing quite well with minimal tenderness.  Objective: Vital signs are stable she is alert and oriented x3 dry sterile dressing intact once removed reveals sutures are intact margins are well coapted minimal edema. Sutures were removed today margins remain well coapted.  Assessment: Well-healing surgical toe fourth left.  Plan: Remove sutures today demonstrated exactly hallux like her to dress the toe daily with compression dressing she understands this and is amenable to it. I will her to start ambulating in a regular shoe if comfortable I will followup with her in 2-4 weeks.

## 2014-08-19 ENCOUNTER — Encounter: Payer: Medicare Other | Admitting: Podiatry

## 2014-08-24 ENCOUNTER — Encounter: Payer: Medicare Other | Admitting: Podiatry

## 2014-08-26 ENCOUNTER — Ambulatory Visit (INDEPENDENT_AMBULATORY_CARE_PROVIDER_SITE_OTHER): Payer: Medicare Other | Admitting: Podiatry

## 2014-08-26 ENCOUNTER — Encounter: Payer: Self-pay | Admitting: Podiatry

## 2014-08-26 ENCOUNTER — Ambulatory Visit (INDEPENDENT_AMBULATORY_CARE_PROVIDER_SITE_OTHER): Payer: Medicare Other

## 2014-08-26 VITALS — BP 130/79 | HR 94 | Resp 16

## 2014-08-26 DIAGNOSIS — Z9889 Other specified postprocedural states: Secondary | ICD-10-CM

## 2014-08-26 DIAGNOSIS — M2040 Other hammer toe(s) (acquired), unspecified foot: Secondary | ICD-10-CM

## 2014-08-26 DIAGNOSIS — M2042 Other hammer toe(s) (acquired), left foot: Secondary | ICD-10-CM

## 2014-08-26 NOTE — Progress Notes (Signed)
She presents today approximately 4 weeks past her surgical date consisting of a digital exostectomy and arthroplasty fourth digit left foot. She denies fever chills nausea vomiting muscle aches and pains and states that the toe feels good.  Objective: Vital signs are stable she is alert and oriented x3. Mild edema but no erythema cellulitis drainage or odor to the fourth digit left foot. Radiographic evaluation demonstrates a complete arthroplasty in good incision.  Assessment: Well-healing surgical toe fourth left.  Plan: Continue to dress the toe with [place] for the next month and followup with me as needed.

## 2014-09-04 IMAGING — CR DG CHEST 2V
2 series · 2 of 2 positions shown · non-contrast
Comparison: None.

CLINICAL DATA: Shortness of breath x1 year

EXAM:
CHEST  2 VIEW

[view not recorded (1 of 2)]
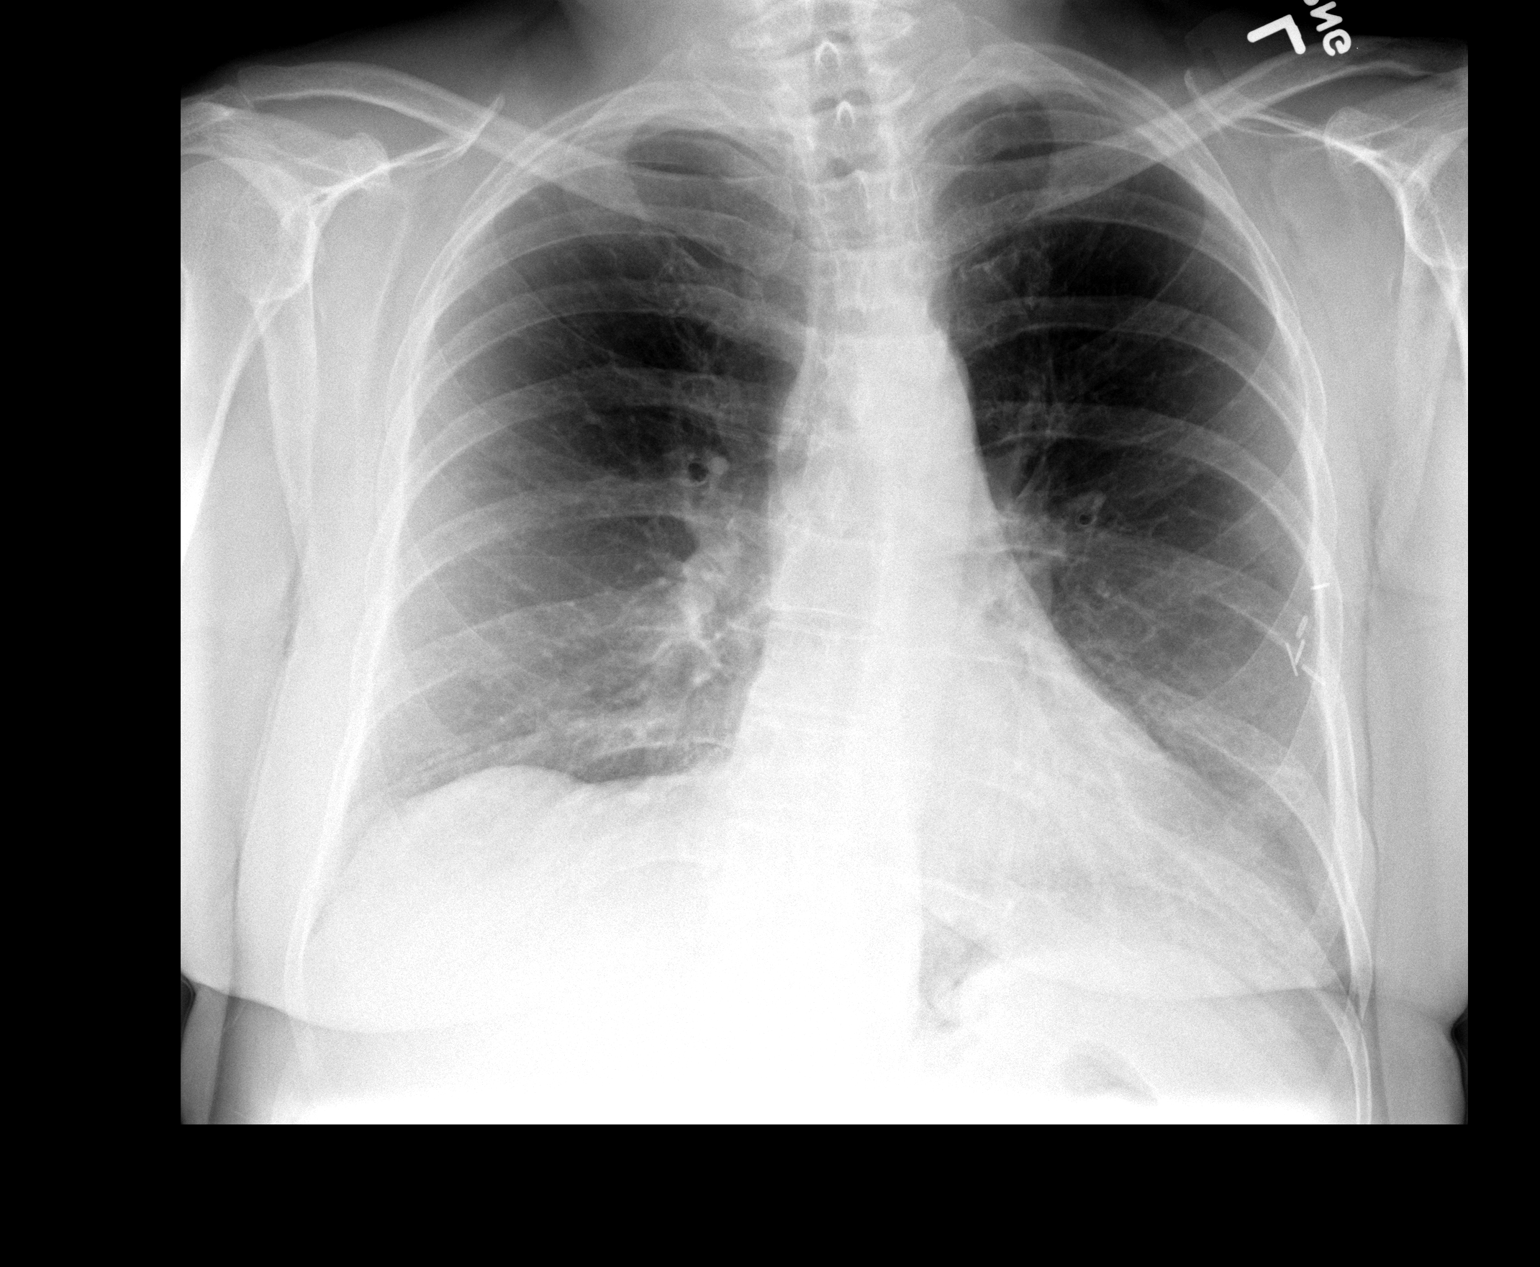

[view not recorded (2 of 2)]
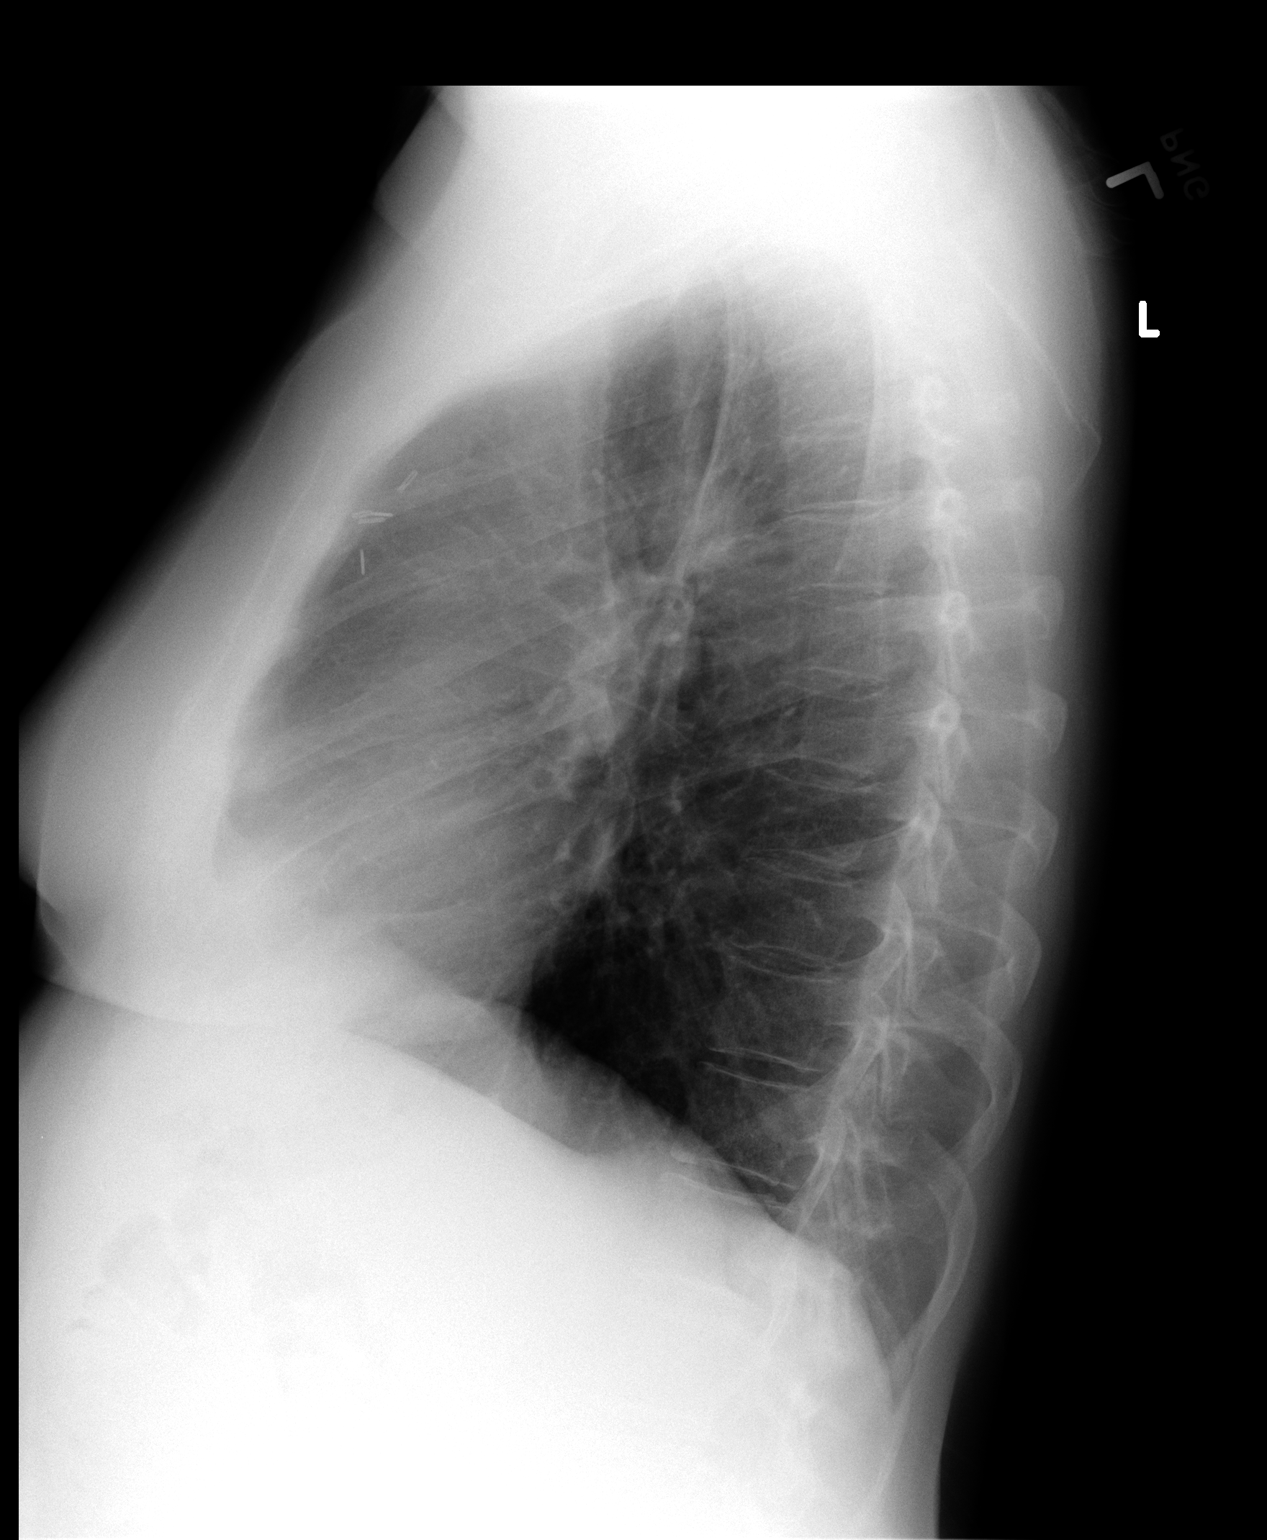

[2 of 2 positions shown; findings below may reference images not displayed]

FINDINGS: Lungs are clear.  No pleural effusion or pneumothorax.

The heart is normal in size.

Visualized osseous structures are within normal limits.

Surgical clips in the left chest wall/breast.
IMPRESSION: No evidence of acute cardiopulmonary disease.

## 2014-10-14 ENCOUNTER — Ambulatory Visit: Payer: Medicare Other | Admitting: Podiatry

## 2014-10-21 DIAGNOSIS — R0600 Dyspnea, unspecified: Secondary | ICD-10-CM | POA: Diagnosis not present

## 2014-10-21 DIAGNOSIS — E785 Hyperlipidemia, unspecified: Secondary | ICD-10-CM | POA: Diagnosis not present

## 2014-10-21 DIAGNOSIS — N183 Chronic kidney disease, stage 3 (moderate): Secondary | ICD-10-CM | POA: Diagnosis not present

## 2014-10-21 DIAGNOSIS — Z23 Encounter for immunization: Secondary | ICD-10-CM | POA: Diagnosis not present

## 2014-10-21 DIAGNOSIS — E559 Vitamin D deficiency, unspecified: Secondary | ICD-10-CM | POA: Diagnosis not present

## 2014-10-21 DIAGNOSIS — K219 Gastro-esophageal reflux disease without esophagitis: Secondary | ICD-10-CM | POA: Diagnosis not present

## 2014-10-21 DIAGNOSIS — Z8673 Personal history of transient ischemic attack (TIA), and cerebral infarction without residual deficits: Secondary | ICD-10-CM | POA: Diagnosis not present

## 2014-10-21 DIAGNOSIS — J309 Allergic rhinitis, unspecified: Secondary | ICD-10-CM | POA: Diagnosis not present

## 2014-11-08 ENCOUNTER — Institutional Professional Consult (permissible substitution): Payer: Medicare Other | Admitting: Internal Medicine

## 2014-11-10 ENCOUNTER — Ambulatory Visit (INDEPENDENT_AMBULATORY_CARE_PROVIDER_SITE_OTHER): Payer: Medicare Other | Admitting: Internal Medicine

## 2014-11-10 ENCOUNTER — Encounter: Payer: Self-pay | Admitting: Internal Medicine

## 2014-11-10 ENCOUNTER — Ambulatory Visit (INDEPENDENT_AMBULATORY_CARE_PROVIDER_SITE_OTHER)
Admission: RE | Admit: 2014-11-10 | Discharge: 2014-11-10 | Disposition: A | Discharge status: Home / Self Care | Payer: Medicare Other | Source: Ambulatory Visit | Attending: Internal Medicine | Admitting: Internal Medicine

## 2014-11-10 ENCOUNTER — Encounter (INDEPENDENT_AMBULATORY_CARE_PROVIDER_SITE_OTHER): Payer: Self-pay

## 2014-11-10 VITALS — BP 126/82 | HR 87 | Ht 65.0 in | Wt 179.0 lb

## 2014-11-10 DIAGNOSIS — K219 Gastro-esophageal reflux disease without esophagitis: Secondary | ICD-10-CM

## 2014-11-10 DIAGNOSIS — R0602 Shortness of breath: Secondary | ICD-10-CM | POA: Diagnosis not present

## 2014-11-10 DIAGNOSIS — R0600 Dyspnea, unspecified: Secondary | ICD-10-CM | POA: Diagnosis not present

## 2014-11-10 MED ORDER — PANTOPRAZOLE SODIUM 40 MG PO TBEC: 40.0000 mg | DELAYED_RELEASE_TABLET | Freq: Two times a day (BID) | ORAL | Status: DC

## 2014-11-10 NOTE — Progress Notes (Signed)
Subjective:    Patient ID: Yvonne Jordan, female    DOB: 03/20/1946   MRN: [id number]  HPI  30 yowm never smoker with some sinus problems starting in her 97's then much worse dripping nose x 32012 then new sob at rest x 2013 referred by Dr [name] to pulmonary clinic 11/10/2014    11/10/2014 1st [facility] office visit/ Arsalan Brisbin   Chief Complaint  Patient presents with  . Pulmonary Consult    Referred by Dr. Antony Contras. Pt c/o SOB for the past year- occurs when eating and talking.   onset was indolent and  Overall worse x 2 y (until started ppi) with increase freq to point where maybe every other day  lasts just a few breaths with sensation of choking never beyond 10 breaths s rx   Much worse before starting pantoprazole, sometimes notes she has to take a second ppi in evening otherwise can get choked at supper   Not reproducible with exertion : Walks at [employer] and mall across parking lot / ok doing housework no yard work s sob   No obvious other patterns in day to day or daytime variabilty or assoc chronic cough or cp or chest tightness, subjective wheeze overt sinus or hb symptoms. No unusual exp hx or h/o childhood pna/ asthma or knowledge of premature birth.  Sleeping ok without nocturnal  or early am exacerbation  of respiratory  c/o's or need for noct saba. Also denies any obvious fluctuation of symptoms with weather or environmental changes or other aggravating or alleviating factors except as outlined above   Current Medications, Allergies, Complete Past Medical History, Past Surgical History, Family History, and Social History were reviewed in [employer] record.              Review of Systems  Constitutional: Negative for fever, chills and unexpected weight change.  HENT: Negative for congestion, dental problem, ear pain, nosebleeds, postnasal drip, rhinorrhea, sinus pressure, sneezing, sore throat, trouble swallowing and voice change.   Eyes:  Negative for visual disturbance.  Respiratory: Positive for shortness of breath. Negative for cough and choking.   Cardiovascular: Negative for chest pain and leg swelling.  Gastrointestinal: Negative for vomiting, abdominal pain and diarrhea.  Genitourinary: Negative for difficulty urinating.       Acid heartburn Indigestion  Musculoskeletal: Negative for arthralgias.  Skin: Negative for rash.  Neurological: Negative for tremors, syncope and headaches.  Hematological: Does not bruise/bleed easily.       Objective:   Physical Exam   amb wf nad   Wt Readings from Last 3 Encounters:  11/10/14 179 lb (81.194 kg)  08/05/14 170 lb (77.111 kg)  12/10/13 175 lb (79.379 kg)    Vital signs reviewed   HEENT: nl dentition, turbinates, and orophanx. Nl external ear canals without cough reflex   NECK :  without JVD/Nodes/TM/ nl carotid upstrokes bilaterally   LUNGS: no acc muscle use, clear to A and P bilaterally without cough on insp or exp maneuvers   CV:  RRR  no s3 or murmur or increase in P2, no edema   ABD:  soft and nontender with nl excursion in the supine position. No bruits or organomegaly, bowel sounds nl  MS:  warm without deformities, calf tenderness, cyanosis or clubbing  SKIN: warm and dry without lesions    NEURO:  alert, approp, no deficits    CXR  11/10/2014 : No evidence of acute cardiopulmonary disease.  Assessment & Plan:

## 2014-11-10 NOTE — Patient Instructions (Signed)
Protonix 40 mg Take 30- 60 min before your first and last meals of the day   GERD (REFLUX)  is an extremely common cause of respiratory symptoms just like yours , many times with no obvious heartburn at all.    It can be treated with medication, but also with lifestyle changes including avoidance of late meals, excessive alcohol, smoking cessation, and avoid fatty foods, chocolate, peppermint, colas, red wine, and acidic juices such as orange juice.  NO MINT OR MENTHOL PRODUCTS SO NO COUGH DROPS  USE SUGARLESS CANDY INSTEAD (Jolley ranchers or Stover's or Life Savers) or even ice chips will also do - the key is to swallow to prevent all throat clearing. NO OIL BASED VITAMINS - use powdered substitutes.    Please see patient coordinator before you leave today  to schedule esophageal evaluation and I will call you with results  Please remember to go to the  x-ray department downstairs for your tests - we will call you with the results when they are available.

## 2014-11-10 NOTE — Assessment & Plan Note (Signed)
Esophogram ordered/ diet reviewed

## 2014-11-10 NOTE — Assessment & Plan Note (Signed)
Symptoms are markedly disproportionate to objective findings and not clear this is a lung problem but pt does appear to have difficult airway management issues. DDX of  difficult airways management all start with A and  include Adherence, Ace Inhibitors, Acid Reflux, Active Sinus Disease, Alpha 1 Antitripsin deficiency, Anxiety masquerading as Airways dz,  ABPA,  allergy(esp in young), Aspiration (esp in elderly), Adverse effects of DPI,  Active smokers, plus two Bs  = Bronchiectasis and Beta blocker use..and one C= CHF   ? Acid (or non-acid) GERD > always difficult to exclude as up to 75% of pts in some series report no assoc GI/ Heartburn symptoms and she clearly got better p starting ppi qam > rec max (24h)  acid suppression and diet restrictions/ reviewed and instructions given in writing and proceed to Esophagram next.  ? Asp > Esophagram   ? Allergy/ asthma > very unlikely as improves within 10 breaths s rx and not reproducible with ex so unlike chf either.   See instructions for specific recommendations which were reviewed directly with the patient who was given a copy with highlighter outlining the key components.

## 2014-11-11 NOTE — Progress Notes (Signed)
Quick Note:  Spoke with pt and notified of results per Dr. Wert. Pt verbalized understanding and denied any questions.  ______ 

## 2014-12-06 ENCOUNTER — Ambulatory Visit (HOSPITAL_COMMUNITY)
Admission: RE | Admit: 2014-12-06 | Discharge: 2014-12-06 | Disposition: A | Discharge status: Home / Self Care | Payer: Medicare Other | Source: Ambulatory Visit | Attending: Internal Medicine | Admitting: Internal Medicine

## 2014-12-06 DIAGNOSIS — R1310 Dysphagia, unspecified: Secondary | ICD-10-CM | POA: Insufficient documentation

## 2014-12-06 DIAGNOSIS — K449 Diaphragmatic hernia without obstruction or gangrene: Secondary | ICD-10-CM | POA: Insufficient documentation

## 2014-12-06 DIAGNOSIS — R0600 Dyspnea, unspecified: Secondary | ICD-10-CM | POA: Insufficient documentation

## 2014-12-06 IMAGING — RF DG ESOPHAGUS
9 of 12 series · 14 of 24 positions shown · non-contrast
Comparison: None.

CLINICAL DATA: Dyspnea.  Dysphagia.

EXAM:
ESOPHOGRAM / BARIUM SWALLOW / BARIUM TABLET STUDY
TECHNIQUE: Combined double contrast and single contrast examination performed
using effervescent crystals, thick barium liquid, and thin barium
liquid. The patient was observed with fluoroscopy swallowing a 13mm
barium sulphate tablet.
FLUOROSCOPY TIME:  1 min 18 seconds

[Series 1: fluoro_barium 2fps_bw · 0.21mm/px · 2 of 6 frames shown (1 of 9)]
[frame 1/6]
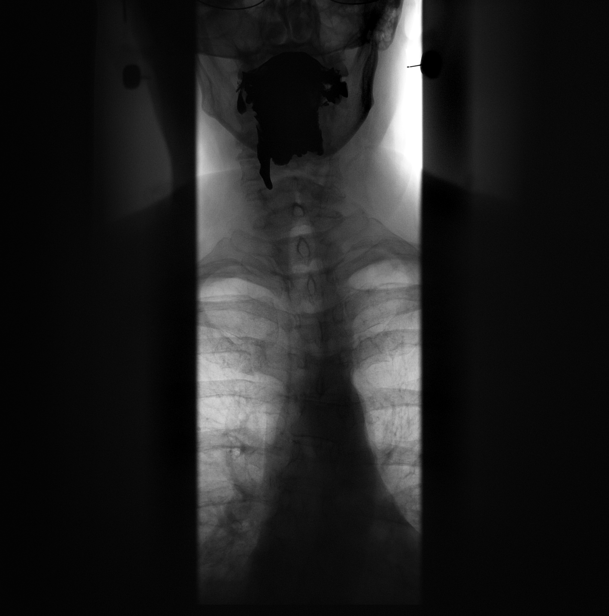
[frame 6/6]
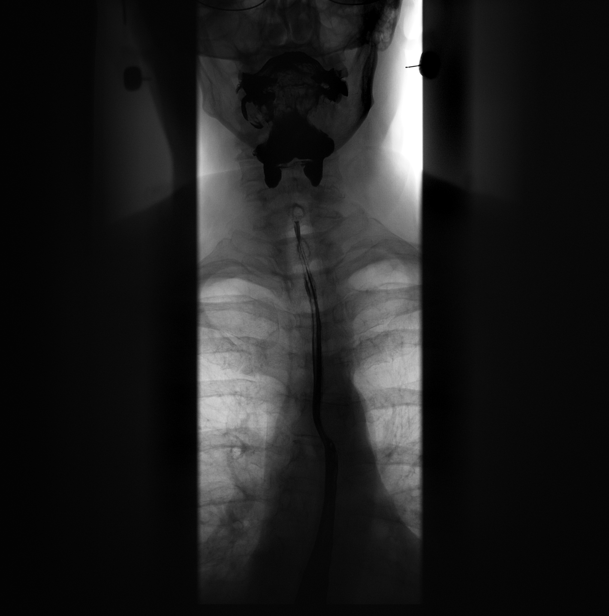

[Series 2: fluoro_barium 2fps_bw · 0.21mm/px · 1 of 2 frames shown (2 of 9)]
[frame 2/2]
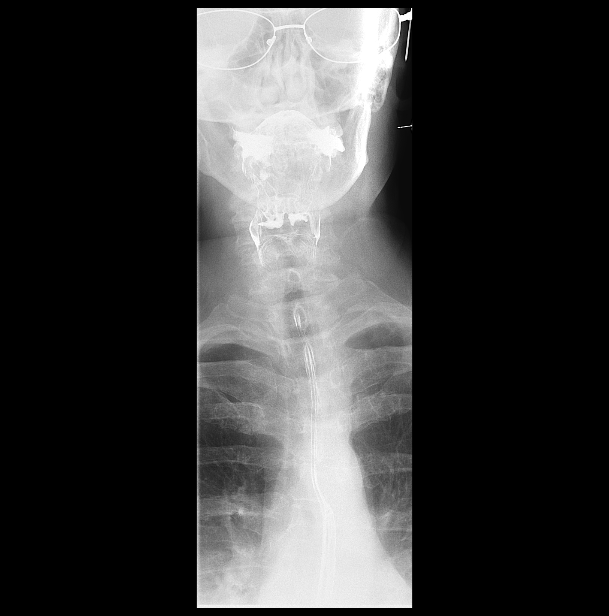

[Series 4: fluoro_barium 2fps_bw · 0.19mm/px · 2 of 3 frames shown (3 of 9)]
[frame 1/3]
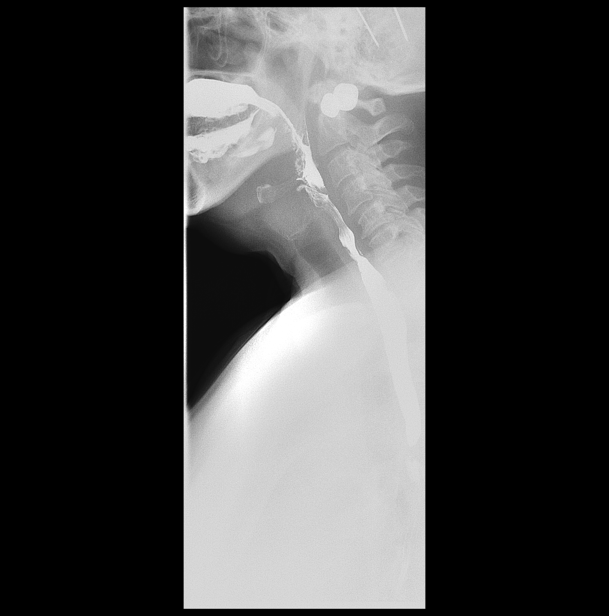
[frame 2/3]
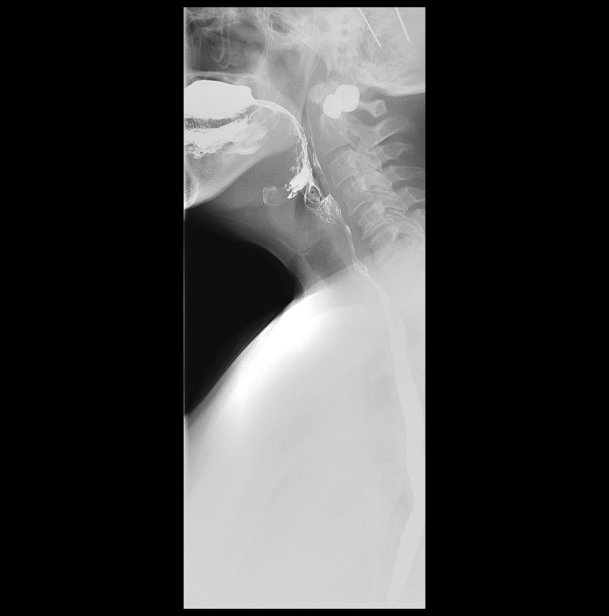

[Series 5: fluoro_barium 2fps_bw · 0.20mm/px · 2 of 4 frames shown (4 of 9)]
[frame 1/4]
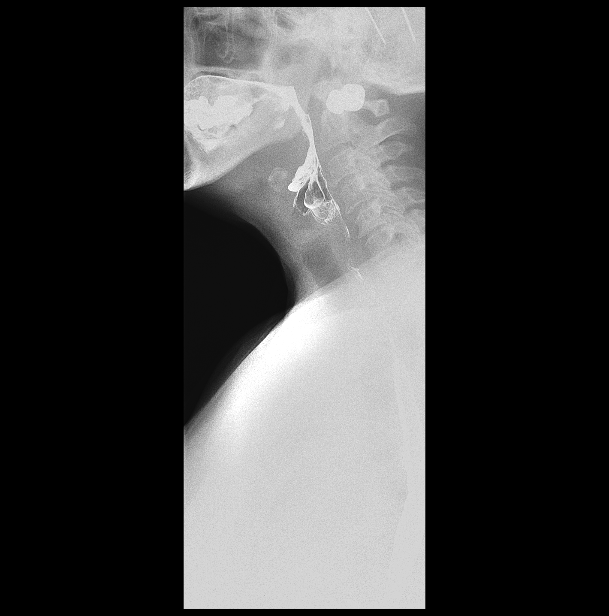
[frame 4/4]
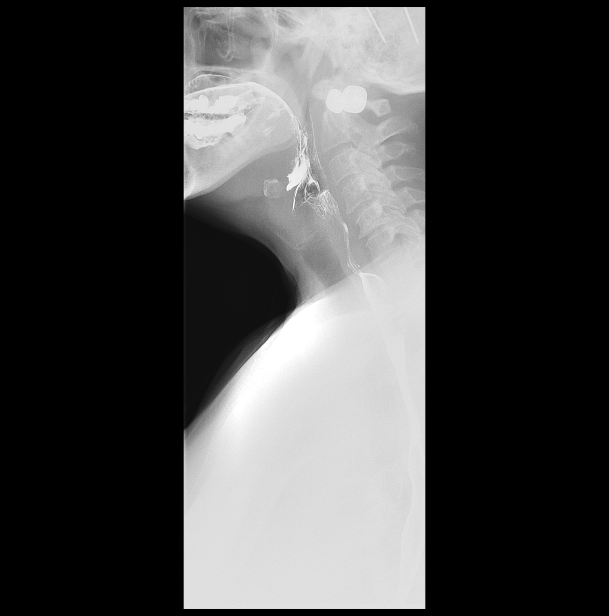

[Series 6: fluoro_barium 2fps_bw · 0.20mm/px · 2 of 7 frames shown (5 of 9)]
[frame 2/7]
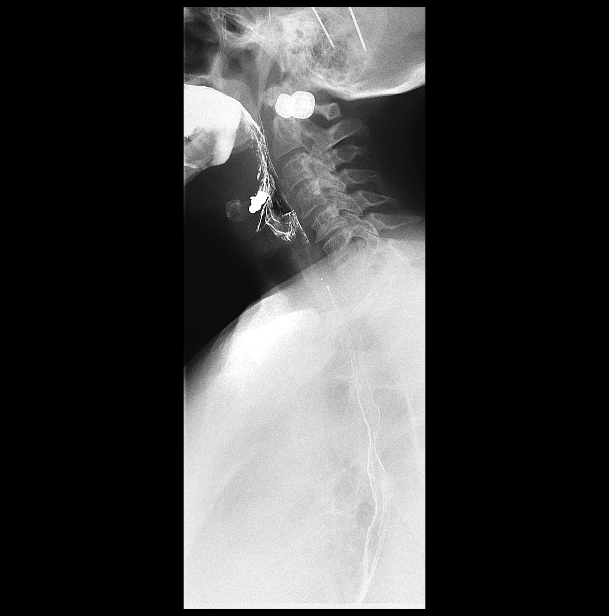
[frame 5/7]
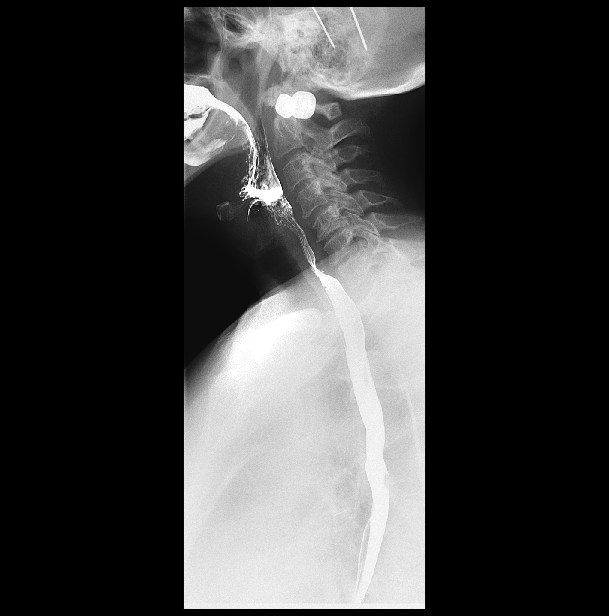

[Series 7: fluoro_barium 2fps_bw · 0.19mm/px · 1 of 2 frames shown (6 of 9)]
[frame 1/2]
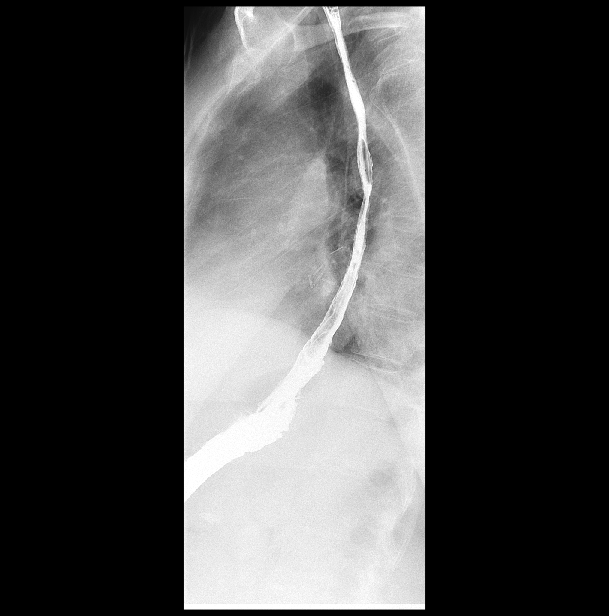

[Series 8: fluoro_barium 2fps_bw · 0.20mm/px · 2 of 21 frames shown (7 of 9)]
[frame 11/21]
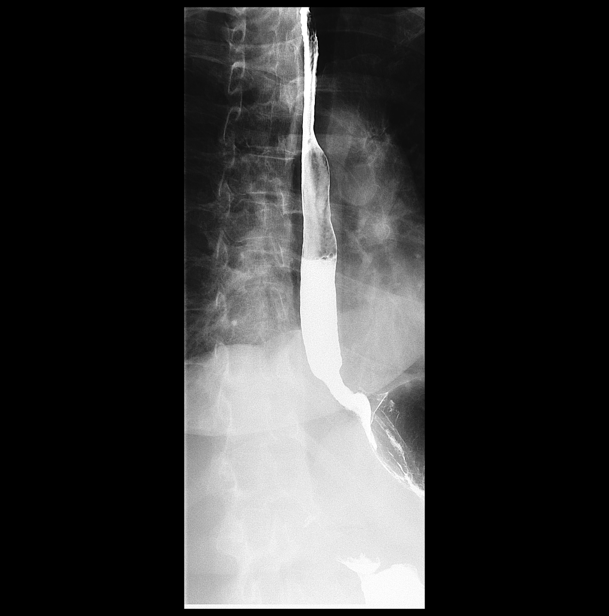
[frame 18/21]
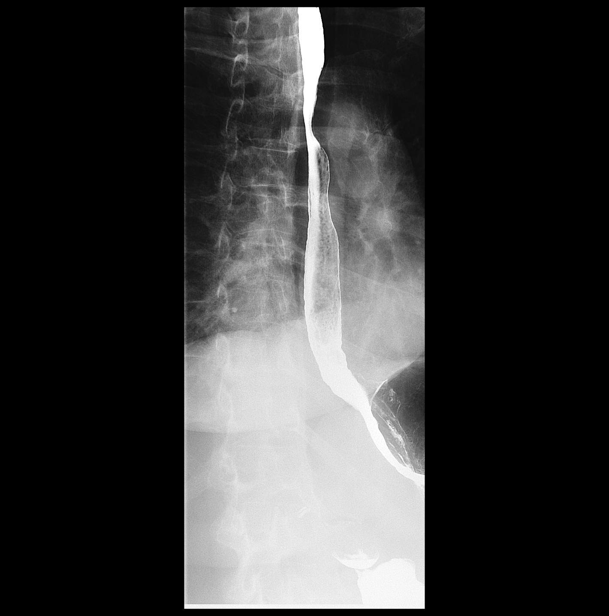

[Series 10: fluoro_barium 2fps_bw · 0.19mm/px · 1 of 1 slices shown (8 of 9)]
[im 1/1]
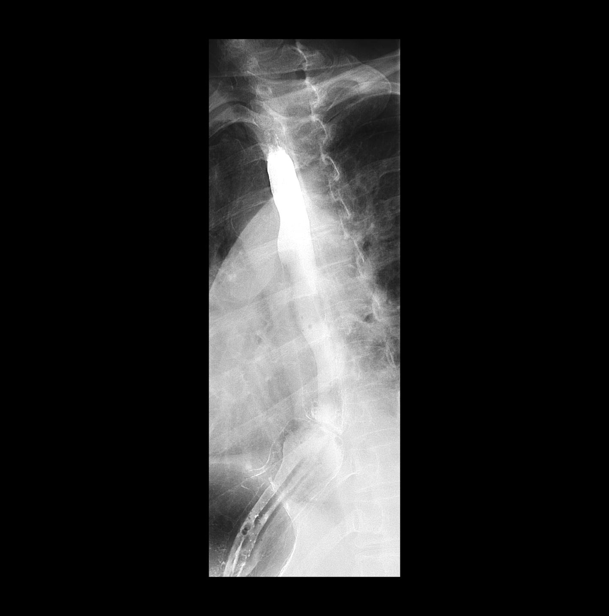

[Series 12: fluoro_barium 2fps_bw · 0.20mm/px · 1 of 1 slices shown (9 of 9)]
[im 1/1]
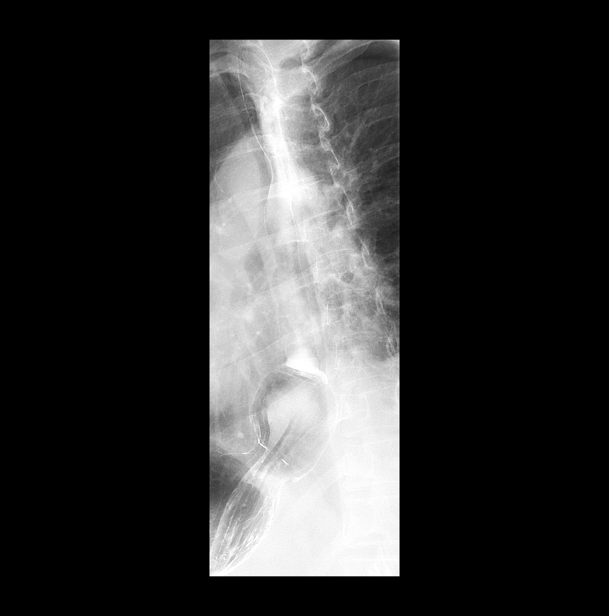

[14 of 24 positions shown; findings below may reference images not displayed]

FINDINGS: The patient had laryngeal penetration with thick barium. There was
some retention valleculae. No aspiration. The patient remained
asymptomatic.

The mucosa and motility of the esophagus are normal. There is a 5 cm
sliding hiatal hernia. No esophagitis or stricture.

A 13 mm barium tablet passed immediately from the mouth to the
stomach with no delay.
IMPRESSION: 1. Asymptomatic laryngeal penetration with thick barium.
2. 5 cm sliding hiatal hernia.

## 2014-12-07 NOTE — Progress Notes (Signed)
Quick Note:  LMTCB ______ 

## 2014-12-10 ENCOUNTER — Telehealth: Payer: Self-pay | Admitting: Internal Medicine

## 2014-12-10 DIAGNOSIS — K219 Gastro-esophageal reflux disease without esophagitis: Secondary | ICD-10-CM

## 2014-12-10 NOTE — Progress Notes (Signed)
Quick Note:  LMTCB on home and cell ______ 

## 2014-12-10 NOTE — Progress Notes (Signed)
Quick Note:  Spoke with pt and notified of results per Dr. Wert. Pt verbalized understanding and denied any questions.  ______ 

## 2014-12-10 NOTE — Telephone Encounter (Signed)
Call patient : Study is c/w large HH so needs to stay on acid suppression and we will need to send her to ST to eval for swallowing as the upper portion of her study was abnormal and will need further eval and they may be able to help with techniques to prevent aspiration in future: ST consult, swallow eval with whatever mode they chose:  Spoke with pt and notified of results per Dr. Melvyn Novas. Pt verbalized understanding and denied any questions. Order sent to [facility] for ST appt

## 2014-12-21 ENCOUNTER — Other Ambulatory Visit: Payer: Self-pay | Admitting: Internal Medicine

## 2014-12-21 ENCOUNTER — Telehealth: Payer: Self-pay | Admitting: Internal Medicine

## 2014-12-21 DIAGNOSIS — K219 Gastro-esophageal reflux disease without esophagitis: Secondary | ICD-10-CM

## 2014-12-21 NOTE — Telephone Encounter (Signed)
Called and spoke with [facility] @ ST in ref to this not being sched since 12/10/14, per Marzetta Board, Speech Therapist recommends a MBS is needed 1st in addition to the esophogram that was done to further evaluate prior to scheduling a speech eval. Spoke with pt to inform she prefers to have done @ [facility] and will schedule in am. .Verdie Mosher

## 2014-12-21 NOTE — Telephone Encounter (Signed)
Called and spoke to pt. Pt stated she has not heard from anyone to make appointment with ST. Referral to speech pathology was placed on 12/10/14.   Alida please advise.

## 2014-12-22 ENCOUNTER — Other Ambulatory Visit (HOSPITAL_COMMUNITY): Payer: Self-pay | Admitting: Internal Medicine

## 2014-12-22 DIAGNOSIS — K2100 Gastro-esophageal reflux disease with esophagitis, without bleeding: Secondary | ICD-10-CM

## 2014-12-28 ENCOUNTER — Other Ambulatory Visit (HOSPITAL_COMMUNITY): Payer: Medicare Other

## 2014-12-28 ENCOUNTER — Ambulatory Visit (HOSPITAL_COMMUNITY): Payer: Medicare Other

## 2014-12-30 ENCOUNTER — Ambulatory Visit (HOSPITAL_COMMUNITY)
Admission: RE | Admit: 2014-12-30 | Discharge: 2014-12-30 | Disposition: A | Discharge status: Home / Self Care | Payer: Medicare Other | Source: Ambulatory Visit | Attending: Internal Medicine | Admitting: Internal Medicine

## 2014-12-30 DIAGNOSIS — N189 Chronic kidney disease, unspecified: Secondary | ICD-10-CM | POA: Insufficient documentation

## 2014-12-30 DIAGNOSIS — Z8673 Personal history of transient ischemic attack (TIA), and cerebral infarction without residual deficits: Secondary | ICD-10-CM | POA: Diagnosis not present

## 2014-12-30 DIAGNOSIS — Z9049 Acquired absence of other specified parts of digestive tract: Secondary | ICD-10-CM | POA: Insufficient documentation

## 2014-12-30 DIAGNOSIS — R1312 Dysphagia, oropharyngeal phase: Secondary | ICD-10-CM | POA: Diagnosis not present

## 2014-12-30 DIAGNOSIS — T17300A Unspecified foreign body in larynx causing asphyxiation, initial encounter: Secondary | ICD-10-CM | POA: Diagnosis not present

## 2014-12-30 DIAGNOSIS — E785 Hyperlipidemia, unspecified: Secondary | ICD-10-CM | POA: Insufficient documentation

## 2014-12-30 DIAGNOSIS — K219 Gastro-esophageal reflux disease without esophagitis: Secondary | ICD-10-CM

## 2014-12-30 DIAGNOSIS — K2100 Gastro-esophageal reflux disease with esophagitis, without bleeding: Secondary | ICD-10-CM

## 2014-12-30 DIAGNOSIS — R1310 Dysphagia, unspecified: Secondary | ICD-10-CM | POA: Diagnosis present

## 2014-12-30 IMAGING — RF DG SWALLOWING FUNCTION
1 series · 17 of 24 positions shown · non-contrast
Comparison: [DATE].

CLINICAL DATA: Aspiration. Solid aspiration. Laryngeal penetration
visible on prior barium swallow.

EXAM:
MODIFIED BARIUM SWALLOW
TECHNIQUE: Different consistencies of barium were administered orally to the
patient by the Speech Pathologist. Imaging of the pharynx was
performed in the lateral projection.
FLUOROSCOPY TIME:  1 min.

[Series 1: run · 17 acquisitions, 17 frames shown]
[im 1/17]
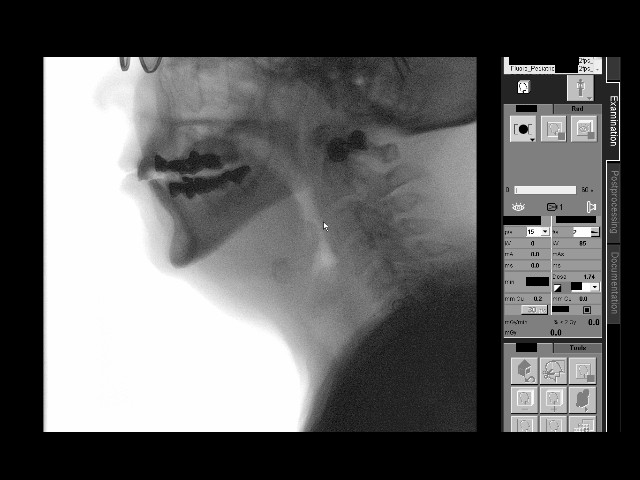
[im 2/17]
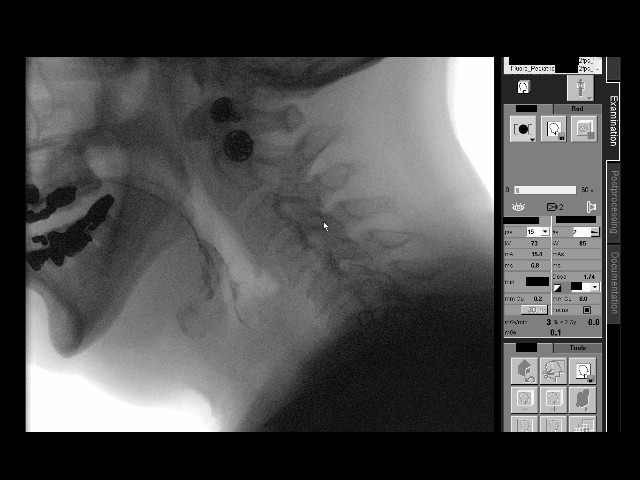
[im 3/17]
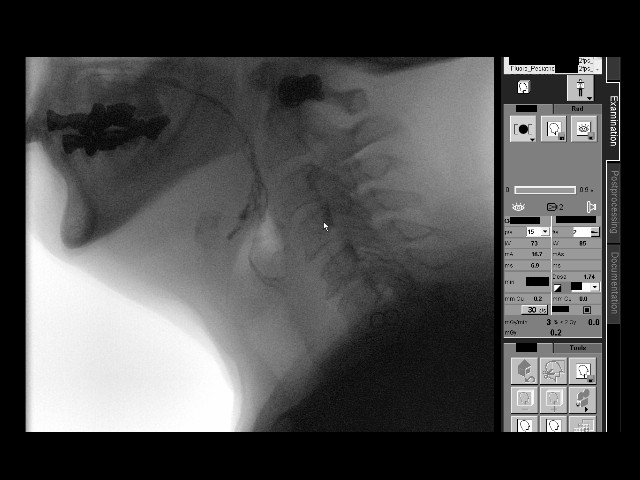
[im 4/17]
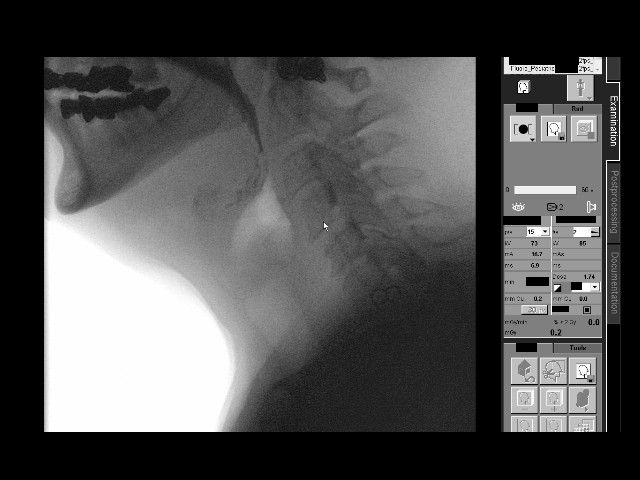
[im 5/17]
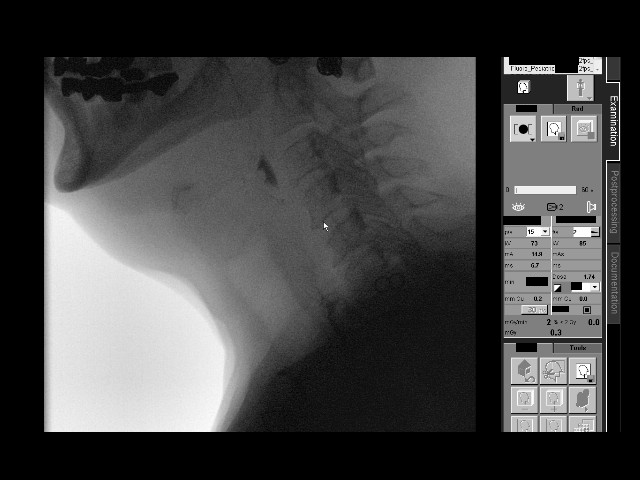
[im 6/17]
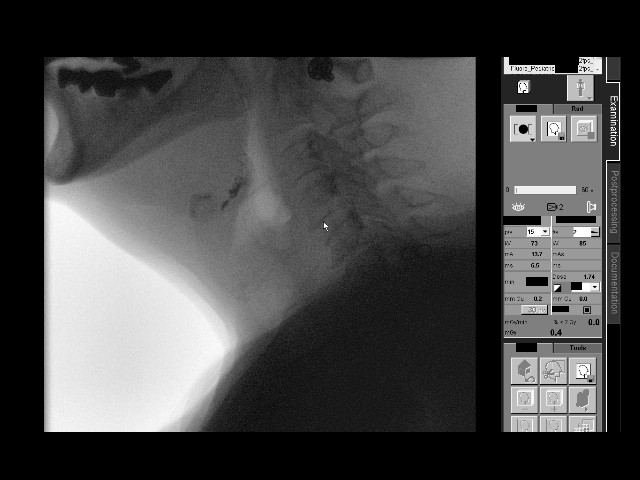
[im 7/17]
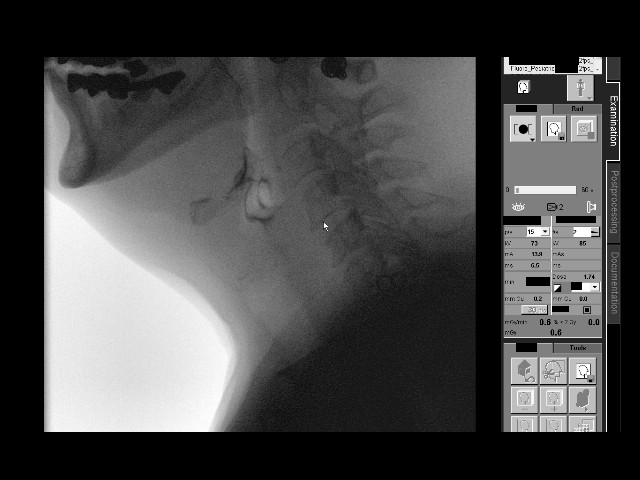
[im 8/17]
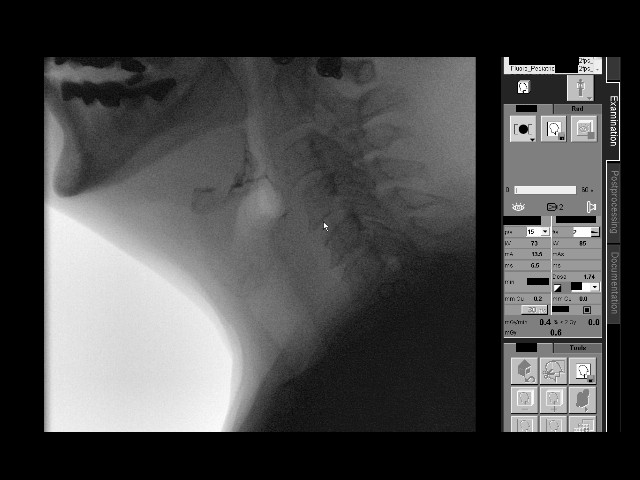
[im 9/17]
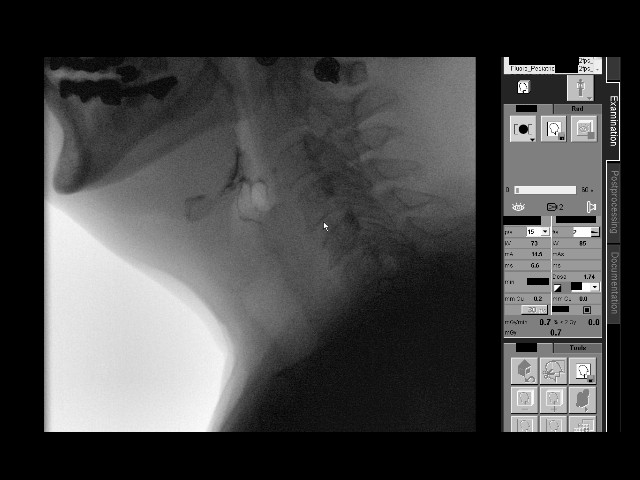
[im 10/17]
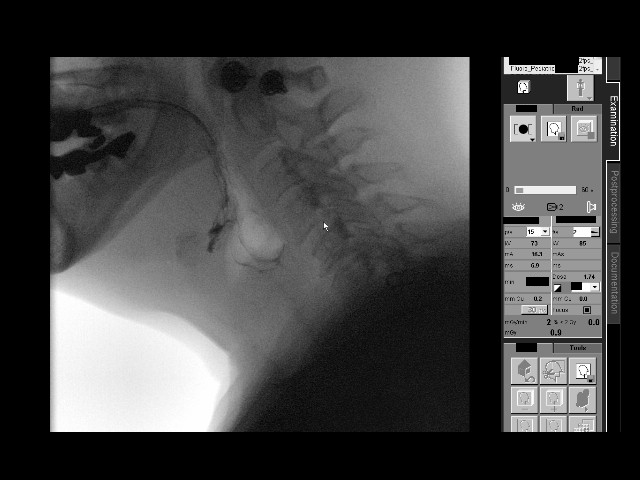
[im 11/17]
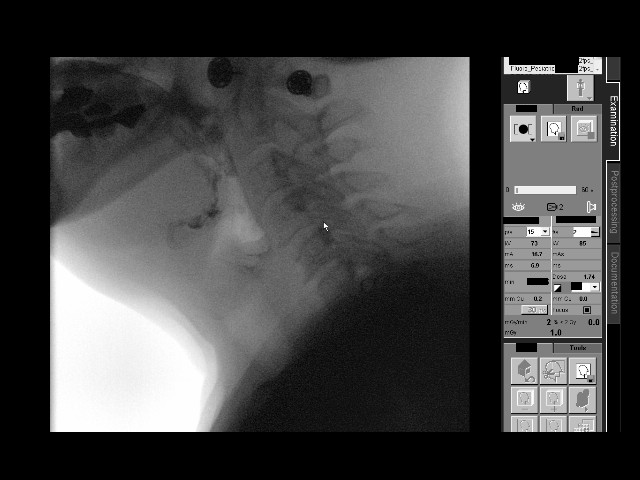
[im 12/17]
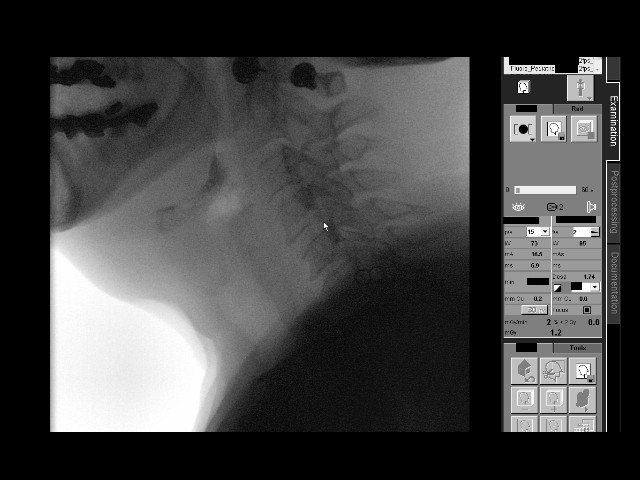
[im 13/17]
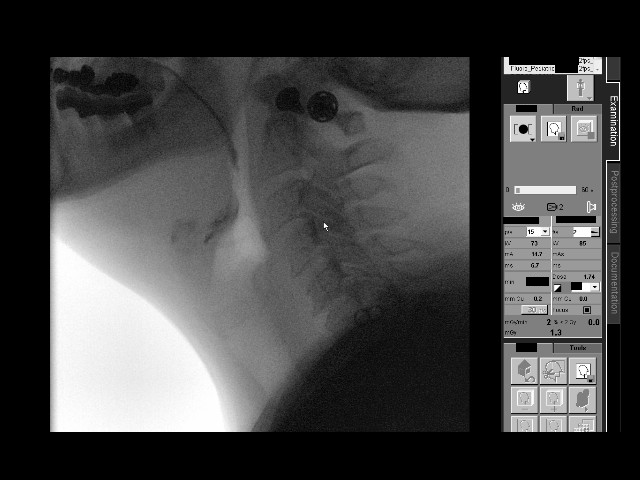
[im 14/17]
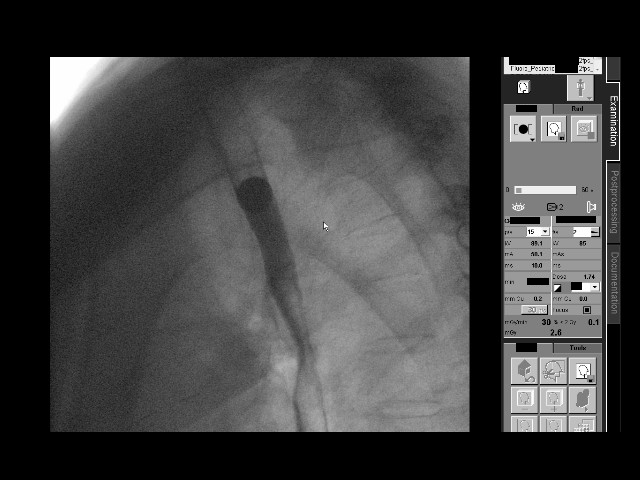
[im 15/17]
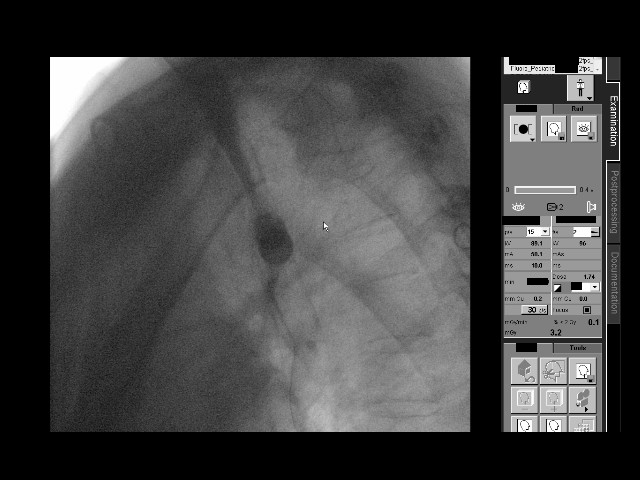
[im 16/17]
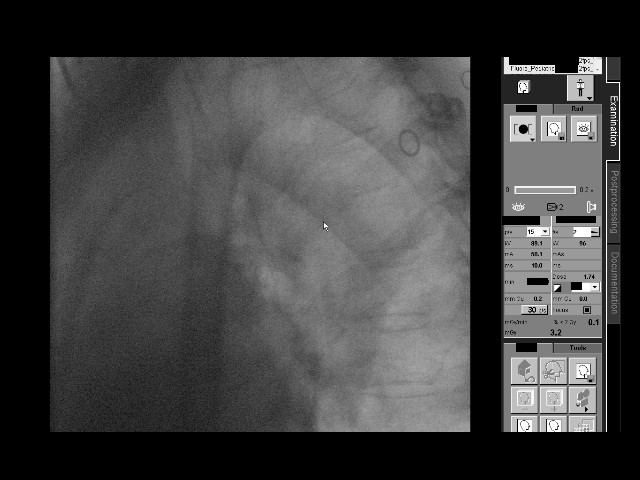
[im 17/17]
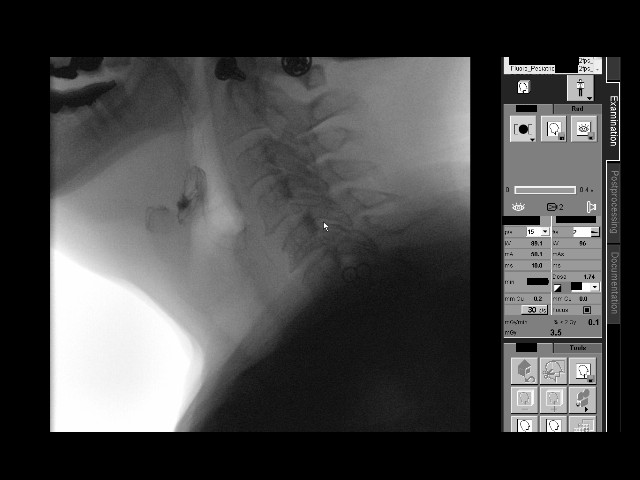

[17 of 24 positions shown; findings below may reference images not displayed]

FINDINGS: Thin liquid- multiple episodes of flash penetration were present. On
the initial swallow, there was aspiration. The patient spontaneously
cough and clear the aspiration.

Nectar thick liquid- within normal limits

Honey- within normal limits

AYMS?AYMS within normal limits

Cracker-within normal limits

AYMS?AYMS with cracker- within normal limits

Barium tablet -  within normal limits
IMPRESSION: Initial swallow with thin barium demonstrated aspiration which
cleared with cough.

Please refer to the Speech Pathologists report for complete details
and recommendations.

## 2014-12-30 NOTE — Procedures (Signed)
Objective Swallowing Evaluation: Modified Barium Swallowing Study  Patient Details  Name: Yvonne Jordan MRN: [id number] Date of Birth: 31-May-1946  Today's Date: 12/30/2014 Time: 1150-1213 SLP Time Calculation (min) (ACUTE ONLY): 23 min  Past Medical History:  Past Medical History  Diagnosis Date  . TIA (transient ischemic attack)   . Vitamin D deficiency   . Allergic rhinitis   . CKD (chronic kidney disease)   . Colon polyp   . Hyperlipidemia    Past Surgical History:  Past Surgical History  Procedure Laterality Date  . Breast surgery      2006...treated with Tamoxifen for 5 years  . Cholecystectomy     HPI:  69 yowm never smoker with some sinus problems starting in her 50's then much worse dripping nose x 32012 then new sob at rest x 2013. Dr Melvyn Novas referred pt for esophagram. Esopahgeal function WNl, but pt observed to penetrate thick barium. Pt endorses vocal changes, vomiting at night, complaint of indigestion.      Assessment / Plan / Recommendation Clinical Impression  Dysphagia Diagnosis: Mild pharyngeal phase dysphagia Clinical impression: Pt demonstrates a mild oropharyngeal dysphagia with sensory deficits leading to a mild delay in swallow. Pt did have one instance of trace sensed aspiration on initial sip. Otherwise only high flash penetration occurred. Given that pt expels aspirate and deficits are minimal would not recommend any modification in behavior or diet. Would question why an otherwise healthy 69 yr old has sensory deficits and delayed swallow. Given vocal changes and reports of reflux would consider mucosal changes due to possible episodes of laryngopharyngeal reflux as a cause. Discussed basic esophageal precautions and suggested further f/u with MD for medical management. No SLP f/u needed at this time.      Treatment Recommendation  No treatment recommended at this time    Diet Recommendation Regular;Thin liquid   Liquid Administration via:  Cup;Straw Medication Administration: Whole meds with liquid Supervision: Patient able to self feed Postural Changes and/or Swallow Maneuvers: Seated upright 90 degrees;Upright 30-60 min after meal    Other  Recommendations Recommended Consults: Consider GI evaluation Oral Care Recommendations: Oral care BID   Follow Up Recommendations       Frequency and Duration        Pertinent Vitals/Pain NA    SLP Swallow Goals     General HPI: 69 yowm never smoker with some sinus problems starting in her 50's then much worse dripping nose x 32012 then new sob at rest x 2013. Dr Melvyn Novas referred pt for esophagram. Esopahgeal function WNl, but pt observed to penetrate thick barium. Pt endorses vocal changes, vomiting at night, complaint of indigestion.  Type of Study: Modified Barium Swallowing Study Reason for Referral: Objectively evaluate swallowing function Previous Swallow Assessment: Esophagram 12/15 Diet Prior to this Study: Regular;Thin liquids Temperature Spikes Noted: N/A Respiratory Status: Room air History of Recent Intubation: No Behavior/Cognition: Alert;Cooperative;Pleasant mood Oral Cavity - Dentition: Adequate natural dentition Oral Motor / Sensory Function: Within functional limits Self-Feeding Abilities: Able to feed self Patient Positioning: Upright in chair Baseline Vocal Quality: Hoarse Volitional Cough: Strong Volitional Swallow: Able to elicit Anatomy: Within functional limits Pharyngeal Secretions: Not observed secondary MBS    Reason for Referral Objectively evaluate swallowing function   Oral Phase Oral Preparation/Oral Phase Oral Phase: WFL   Pharyngeal Phase Pharyngeal Phase Pharyngeal Phase: Impaired Pharyngeal - Thin Pharyngeal - Thin Cup: Delayed swallow initiation;Penetration/Aspiration before swallow;Trace aspiration Penetration/Aspiration details (thin cup): Material enters airway, passes BELOW cords then ejected  out;Material does not enter  airway Pharyngeal - Thin Straw: Delayed swallow initiation;Penetration/Aspiration before swallow Penetration/Aspiration details (thin straw): Material enters airway, remains ABOVE vocal cords then ejected out;Material does not enter airway Pharyngeal - Solids Pharyngeal - Puree: Within functional limits Pharyngeal - Regular: Within functional limits Pharyngeal - Pill: Within functional limits  Cervical Esophageal Phase    GO             Yvonne Baltimore, MA CCC-SLP [phone]  Yvonne Jordan 12/30/2014, 3:26 PM

## 2014-12-30 NOTE — Progress Notes (Signed)
   12/30/14 1400  SLP G-Codes **NOT FOR INPATIENT CLASS**  Functional Assessment Tool Used (clinical judgment)  Functional Limitations Swallowing  Swallow Current Status (P7948) CJ  Swallow Goal Status (A1655) CJ  Swallow Discharge Status (V7482) CJ  SLP Evaluations  $ SLP Speech Visit 1 Procedure  SLP Evaluations  $MBS Swallow 1 Procedure  $Swallowing Treatment 1 Procedure

## 2015-01-03 NOTE — Progress Notes (Signed)
Quick Note:  LMTCB ______ 

## 2015-01-12 ENCOUNTER — Telehealth: Payer: Self-pay | Admitting: Internal Medicine

## 2015-01-12 NOTE — Telephone Encounter (Signed)
lmomtcb x1 

## 2015-01-13 NOTE — Telephone Encounter (Signed)
LMTC x 1  

## 2015-01-19 NOTE — Telephone Encounter (Addendum)
LMTCB x 3 Message to be closed per triage protocol.

## 2015-02-10 ENCOUNTER — Ambulatory Visit (INDEPENDENT_AMBULATORY_CARE_PROVIDER_SITE_OTHER): Payer: Medicare Other | Admitting: Internal Medicine

## 2015-02-10 ENCOUNTER — Encounter: Payer: Self-pay | Admitting: Internal Medicine

## 2015-02-10 ENCOUNTER — Encounter (INDEPENDENT_AMBULATORY_CARE_PROVIDER_SITE_OTHER): Payer: Self-pay

## 2015-02-10 VITALS — BP 112/74 | HR 84 | Ht 65.0 in | Wt 178.8 lb

## 2015-02-10 DIAGNOSIS — K219 Gastro-esophageal reflux disease without esophagitis: Secondary | ICD-10-CM | POA: Diagnosis not present

## 2015-02-10 NOTE — Progress Notes (Signed)
Subjective:    Patient ID: Yvonne Jordan, female    DOB: 12-18-1946   MRN: [id number]    Brief patient profile:  77 yowm never smoker with some sinus problems starting in her 65's then much worse dripping nose x 02/2011 then new sob at rest x 2013 referred by Dr [name] to pulmonary clinic 11/10/2014     History of Present Illness  11/10/2014 1st [facility] office visit/ Yvonne Jordan   Chief Complaint  Patient presents with  . Pulmonary Consult    Referred by Dr. Antony Contras. Pt c/o SOB for the past year- occurs when eating and talking.   onset was indolent and  Overall worse x 2 y (until started ppi) with increase freq to point where maybe every other day  lasts just a few breaths with sensation of choking never beyond 10 breaths s rx  Much worse before starting pantoprazole, sometimes notes she has to take a second ppi in evening otherwise can get choked at supper  Not reproducible with exertion : Walks at [employer] and mall across parking lot / ok doing housework no yard work s sob  rec Protonix 40 mg Take 30- 60 min before your first and last meals of the day  GERD diet   Please see patient coordinator before you leave today  to schedule esophageal evaluation> 01/02/15 Initial swallow with thin barium demonstrated aspiration which cleared with cough.     02/10/2015 f/u ov/Yvonne Jordan re: sob/ cough / better p speech therapy eval Chief Complaint  Patient presents with  . Follow-up    Pt states that her breathing has improved slightly since her last visit. No new co's today.      Not on any pulmonary meds/ 02   No obvious day to day or daytime variabilty or assoc  cp or chest tightness, subjective wheeze overt sinus or hb symptoms. No unusual exp hx or h/o childhood pna/ asthma or knowledge of premature birth.  Sleeping ok without nocturnal  or early am exacerbation  of respiratory  c/o's or need for noct saba. Also denies any obvious fluctuation of symptoms with weather or environmental  changes or other aggravating or alleviating factors except as outlined above   Current Medications, Allergies, Complete Past Medical History, Past Surgical History, Family History, and Social History were reviewed in [employer] record.  ROS  The following are not active complaints unless bolded sore throat, dysphagia, dental problems, itching, sneezing,  nasal congestion or excess/ purulent secretions, ear ache,   fever, chills, sweats, unintended wt loss, pleuritic or exertional cp, hemoptysis,  orthopnea pnd or leg swelling, presyncope, palpitations, heartburn, abdominal pain, anorexia, nausea, vomiting, diarrhea  or change in bowel or urinary habits, change in stools or urine, dysuria,hematuria,  rash, arthralgias, visual complaints, headache, numbness weakness or ataxia or problems with walking or coordination,  change in mood/affect or memory.                                Objective:   Physical Exam   amb wf nad   02/10/2015         179  Wt Readings from Last 3 Encounters:  11/10/14 179 lb (81.194 kg)  08/05/14 170 lb (77.111 kg)  12/10/13 175 lb (79.379 kg)    Vital signs reviewed   HEENT: nl dentition, turbinates, and orophanx. Nl external ear canals without cough reflex   NECK :  without  JVD/Nodes/TM/ nl carotid upstrokes bilaterally   LUNGS: no acc muscle use, clear to A and P bilaterally without cough on insp or exp maneuvers   CV:  RRR  no s3 or murmur or increase in P2, no edema   ABD:  soft and nontender with nl excursion in the supine position. No bruits or organomegaly, bowel sounds nl  MS:  warm without deformities, calf tenderness, cyanosis or clubbing  SKIN: warm and dry without lesions    NEURO:  alert, approp, no deficits    CXR  11/10/2014 : No evidence of acute cardiopulmonary disease.       Assessment & Plan:

## 2015-02-10 NOTE — Patient Instructions (Signed)
Continue the acid suppression and diet as before   If symptoms worsen the next step is a GI evaluation, call for referral     If you are satisfied with your treatment plan,  let your doctor know and he/she can either refill your medications or you can return here when your prescription runs out.     If in any way you are not 100% satisfied,  please tell us.  If 100% better, tell your friends!  Pulmonary follow up is as needed

## 2015-02-13 ENCOUNTER — Encounter: Payer: Self-pay | Admitting: Internal Medicine

## 2015-02-13 NOTE — Assessment & Plan Note (Addendum)
Dg Es 12/06/14 The patient had laryngeal penetration with thick barium. There was some retention valleculae. No aspiration. The patient remained asymptomatic. The mucosa and motility of the esophagus are normal. There is a 5 cm HH - Speech therapy eval 12/30/13 > diet rec:   Regular;Thin liquid  Liquid Administration via: Cup;Straw Medication Administration: Whole meds with liquid Supervision: Patient able to self feed Postural Changes and/or Swallow Maneuvers: Seated upright 90 degrees;Upright 30-60 min after meal   I had an extended summary discussion with the patient and her caretaker  reviewing all relevant studies completed to date and  lasting 15 to 20 minutes of a 25 minute visit on the following ongoing concerns:   Clearly her problem is her swallowing though for now has improved, so rec continue max gerd rx/ diet/ f/u GI prn hopefully can avoid peg for a long time  Pulmonary meds not needed, pulmonary f/u can be prn

## 2015-04-22 DIAGNOSIS — N183 Chronic kidney disease, stage 3 (moderate): Secondary | ICD-10-CM | POA: Diagnosis not present

## 2015-04-22 DIAGNOSIS — J309 Allergic rhinitis, unspecified: Secondary | ICD-10-CM | POA: Diagnosis not present

## 2015-04-22 DIAGNOSIS — K219 Gastro-esophageal reflux disease without esophagitis: Secondary | ICD-10-CM | POA: Diagnosis not present

## 2015-04-22 DIAGNOSIS — R8290 Unspecified abnormal findings in urine: Secondary | ICD-10-CM | POA: Diagnosis not present

## 2015-04-22 DIAGNOSIS — E559 Vitamin D deficiency, unspecified: Secondary | ICD-10-CM | POA: Diagnosis not present

## 2015-04-22 DIAGNOSIS — E785 Hyperlipidemia, unspecified: Secondary | ICD-10-CM | POA: Diagnosis not present

## 2015-04-22 DIAGNOSIS — A084 Viral intestinal infection, unspecified: Secondary | ICD-10-CM | POA: Diagnosis not present

## 2015-04-22 DIAGNOSIS — M545 Low back pain: Secondary | ICD-10-CM | POA: Diagnosis not present

## 2015-04-22 DIAGNOSIS — R0600 Dyspnea, unspecified: Secondary | ICD-10-CM | POA: Diagnosis not present

## 2015-04-22 DIAGNOSIS — Z853 Personal history of malignant neoplasm of breast: Secondary | ICD-10-CM | POA: Diagnosis not present

## 2015-04-22 DIAGNOSIS — Z8673 Personal history of transient ischemic attack (TIA), and cerebral infarction without residual deficits: Secondary | ICD-10-CM | POA: Diagnosis not present

## 2015-05-11 DIAGNOSIS — Z01419 Encounter for gynecological examination (general) (routine) without abnormal findings: Secondary | ICD-10-CM | POA: Diagnosis not present

## 2015-05-11 DIAGNOSIS — Z853 Personal history of malignant neoplasm of breast: Secondary | ICD-10-CM | POA: Diagnosis not present

## 2015-06-01 ENCOUNTER — Other Ambulatory Visit: Payer: Self-pay

## 2015-06-01 DIAGNOSIS — Z853 Personal history of malignant neoplasm of breast: Secondary | ICD-10-CM

## 2015-06-01 DIAGNOSIS — N644 Mastodynia: Secondary | ICD-10-CM

## 2015-06-01 DIAGNOSIS — Z1231 Encounter for screening mammogram for malignant neoplasm of breast: Secondary | ICD-10-CM

## 2015-07-18 DIAGNOSIS — H0289 Other specified disorders of eyelid: Secondary | ICD-10-CM | POA: Diagnosis not present

## 2015-07-18 DIAGNOSIS — H01003 Unspecified blepharitis right eye, unspecified eyelid: Secondary | ICD-10-CM | POA: Diagnosis not present

## 2015-07-18 DIAGNOSIS — H18413 Arcus senilis, bilateral: Secondary | ICD-10-CM | POA: Diagnosis not present

## 2015-07-29 ENCOUNTER — Ambulatory Visit
Admission: RE | Admit: 2015-07-29 | Discharge: 2015-07-29 | Disposition: A | Discharge status: Home / Self Care | Payer: Medicare Other | Source: Ambulatory Visit | Attending: Family Medicine | Admitting: Family Medicine

## 2015-07-29 ENCOUNTER — Other Ambulatory Visit: Payer: Self-pay | Admitting: Family Medicine

## 2015-07-29 DIAGNOSIS — M79642 Pain in left hand: Secondary | ICD-10-CM

## 2015-07-29 IMAGING — CR DG HAND COMPLETE 3+V*L*
3 series · 3 of 3 positions shown · non-contrast
Comparison: None.

CLINICAL DATA: Left hand pain.

EXAM:
LEFT HAND - COMPLETE 3+ VIEW

[view not recorded (1 of 3)]
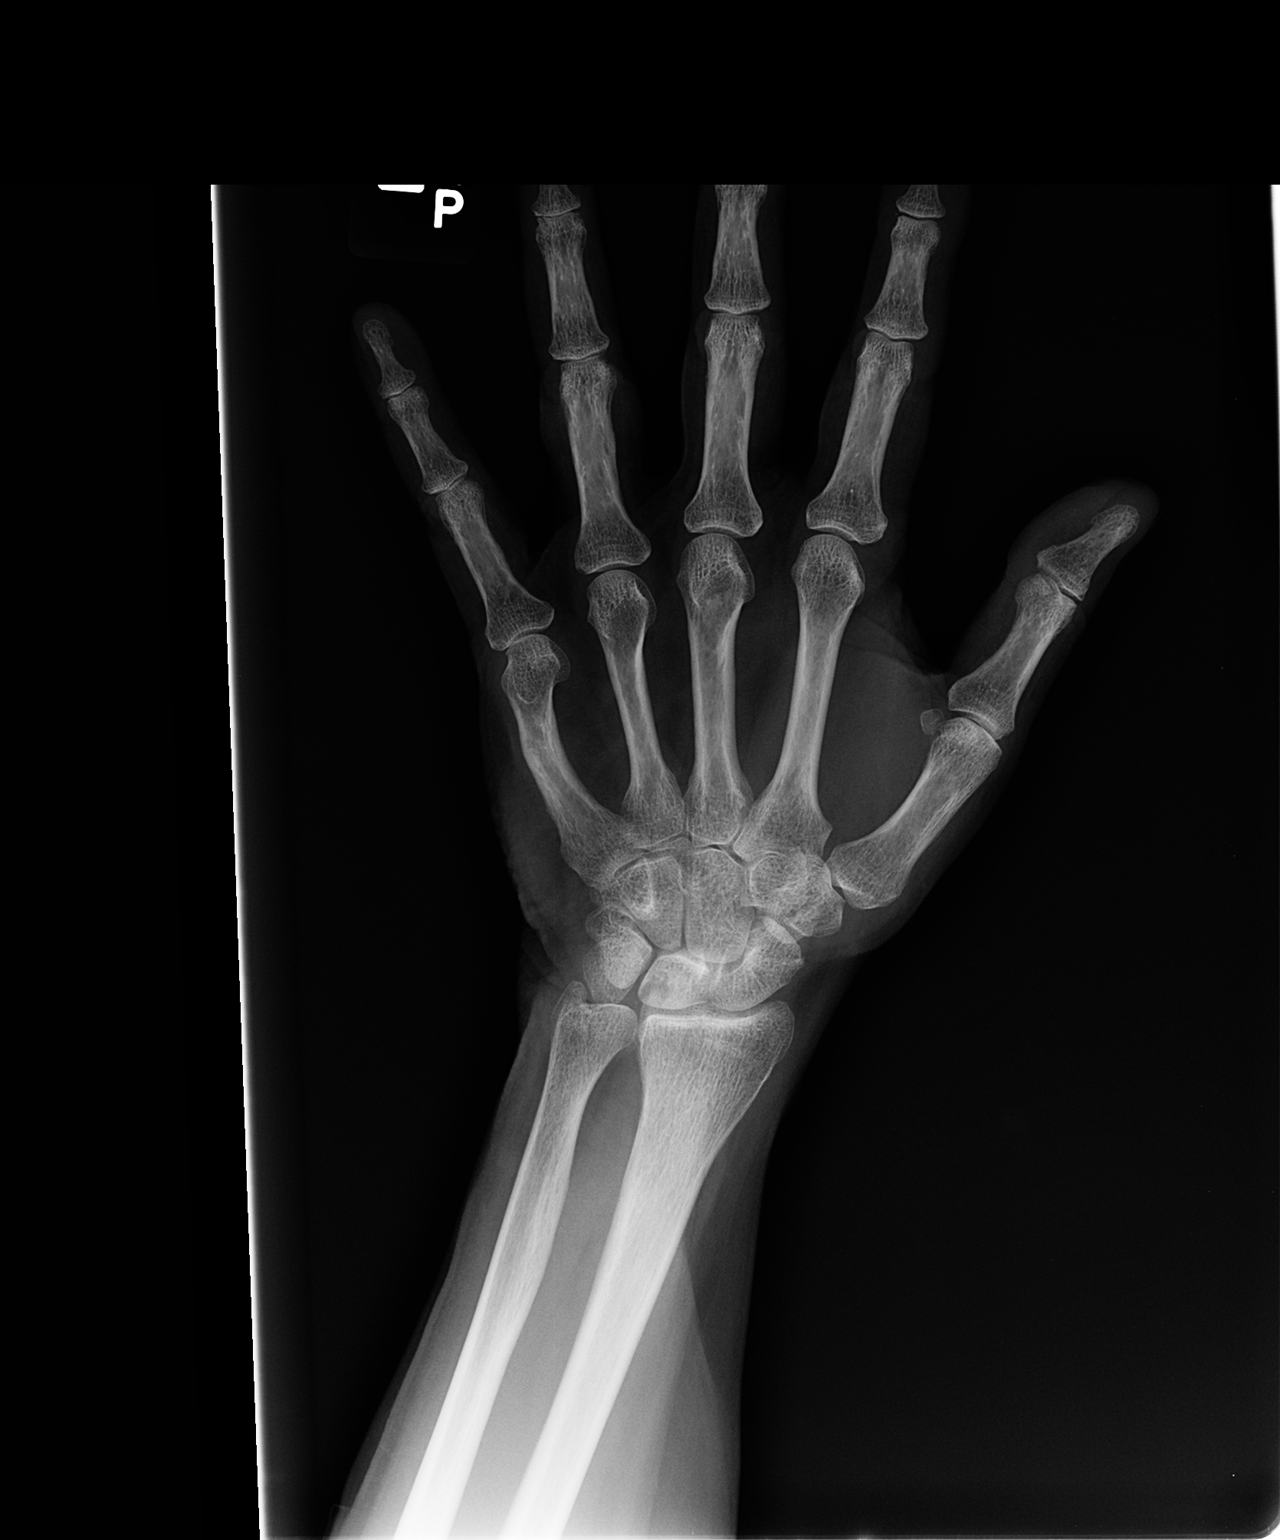

[view not recorded (2 of 3)]
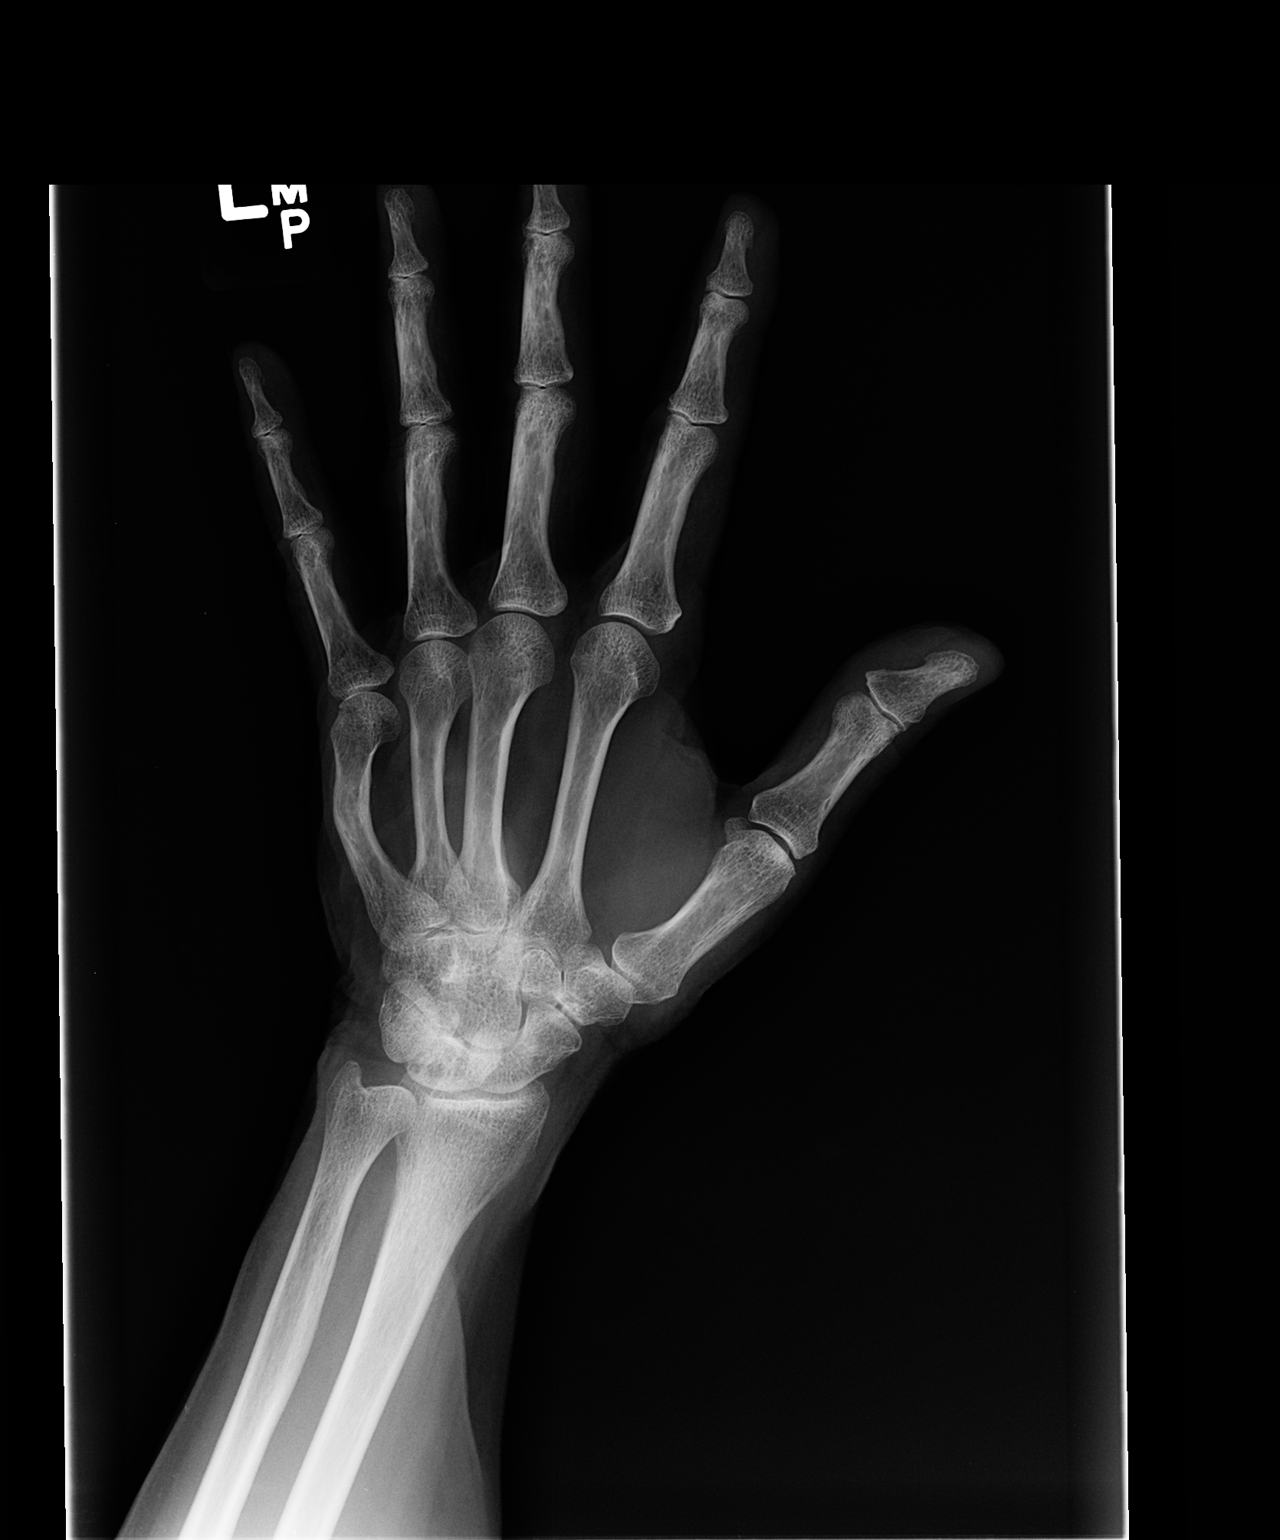

[view not recorded (3 of 3)]
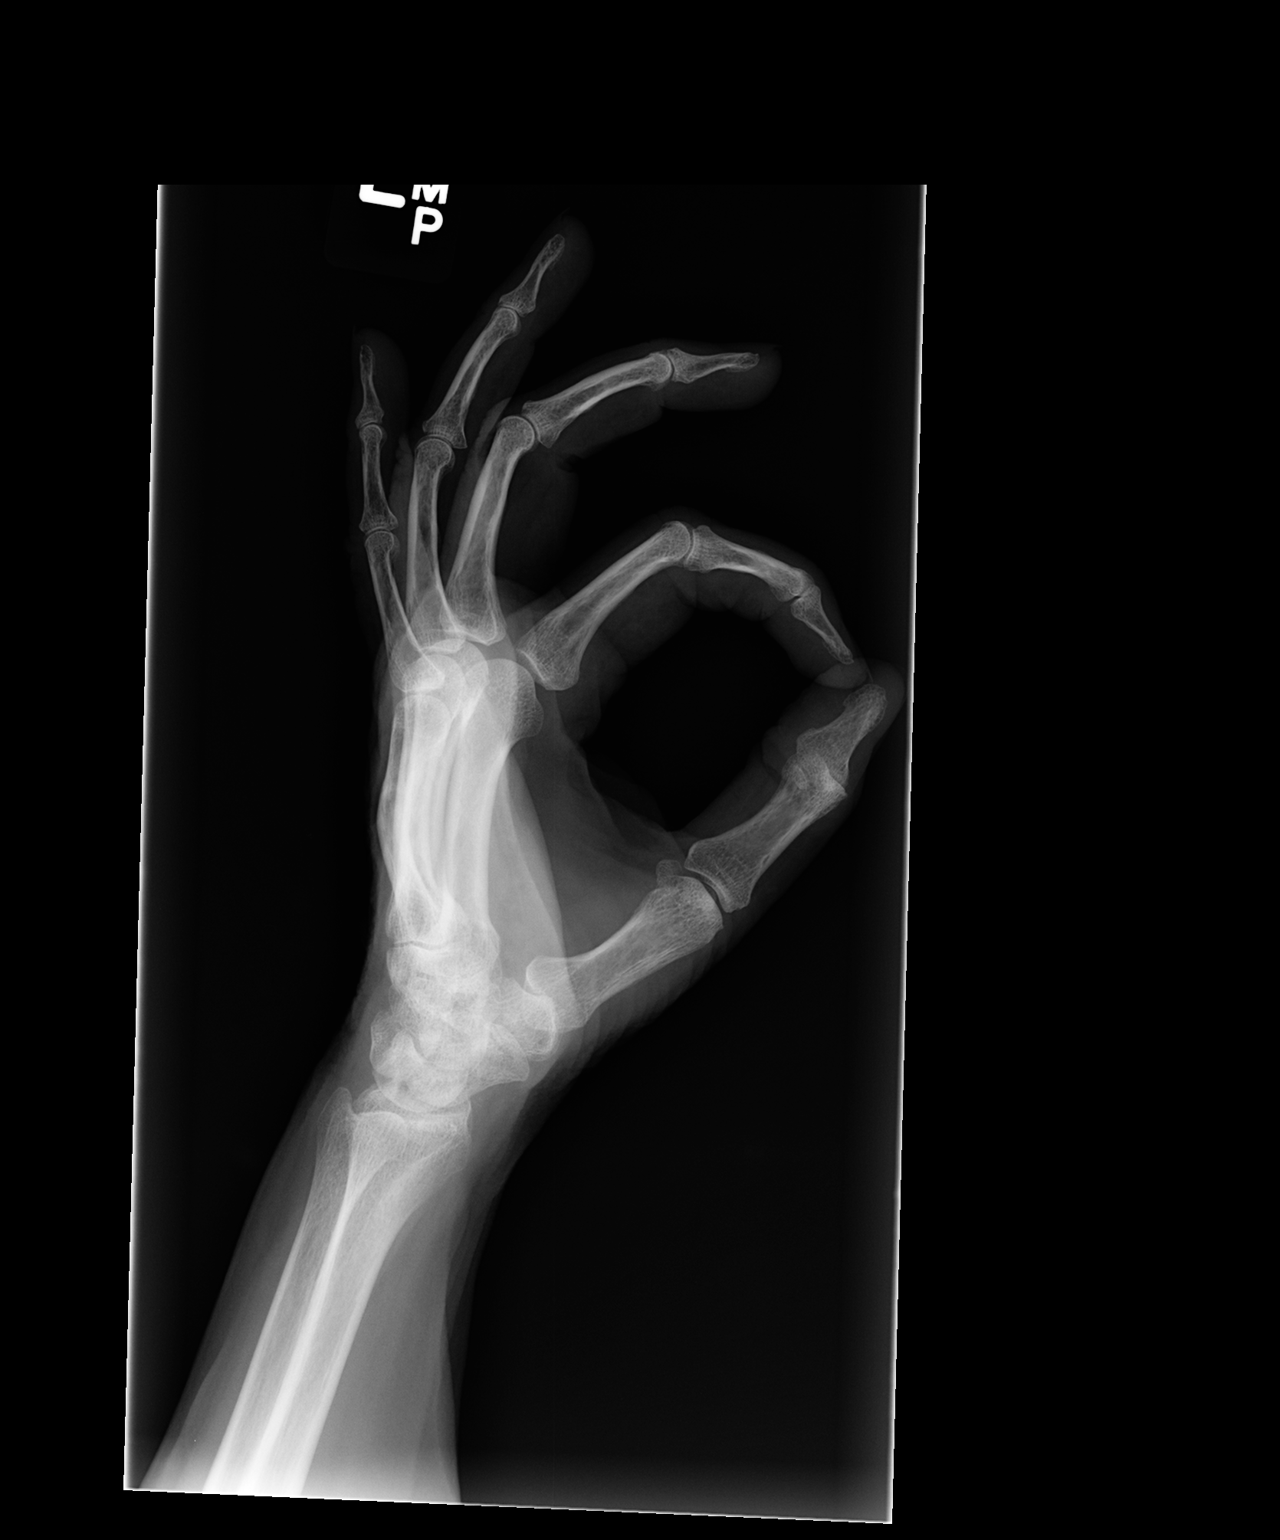

[3 of 3 positions shown; findings below may reference images not displayed]

FINDINGS: A fracture of the left fifth metacarpal is noted. Fracture appears
to be old. No evidence of acute displaced fracture.
IMPRESSION: Old angulated fracture of the left fifth metacarpal.

## 2015-08-04 DIAGNOSIS — M25642 Stiffness of left hand, not elsewhere classified: Secondary | ICD-10-CM | POA: Diagnosis not present

## 2015-08-04 DIAGNOSIS — M79642 Pain in left hand: Secondary | ICD-10-CM | POA: Diagnosis not present

## 2015-08-04 DIAGNOSIS — M25442 Effusion, left hand: Secondary | ICD-10-CM | POA: Diagnosis not present

## 2015-08-04 DIAGNOSIS — M6281 Muscle weakness (generalized): Secondary | ICD-10-CM | POA: Diagnosis not present

## 2015-08-11 DIAGNOSIS — M6281 Muscle weakness (generalized): Secondary | ICD-10-CM | POA: Diagnosis not present

## 2015-08-11 DIAGNOSIS — M25642 Stiffness of left hand, not elsewhere classified: Secondary | ICD-10-CM | POA: Diagnosis not present

## 2015-08-11 DIAGNOSIS — M25442 Effusion, left hand: Secondary | ICD-10-CM | POA: Diagnosis not present

## 2015-08-11 DIAGNOSIS — M79642 Pain in left hand: Secondary | ICD-10-CM | POA: Diagnosis not present

## 2015-08-18 DIAGNOSIS — M79642 Pain in left hand: Secondary | ICD-10-CM | POA: Diagnosis not present

## 2015-08-18 DIAGNOSIS — M25642 Stiffness of left hand, not elsewhere classified: Secondary | ICD-10-CM | POA: Diagnosis not present

## 2015-08-18 DIAGNOSIS — M25442 Effusion, left hand: Secondary | ICD-10-CM | POA: Diagnosis not present

## 2015-08-18 DIAGNOSIS — M6281 Muscle weakness (generalized): Secondary | ICD-10-CM | POA: Diagnosis not present

## 2015-08-22 DIAGNOSIS — H524 Presbyopia: Secondary | ICD-10-CM | POA: Diagnosis not present

## 2015-08-22 DIAGNOSIS — H25013 Cortical age-related cataract, bilateral: Secondary | ICD-10-CM | POA: Diagnosis not present

## 2015-08-22 DIAGNOSIS — H2513 Age-related nuclear cataract, bilateral: Secondary | ICD-10-CM | POA: Diagnosis not present

## 2015-08-22 DIAGNOSIS — H18413 Arcus senilis, bilateral: Secondary | ICD-10-CM | POA: Diagnosis not present

## 2015-09-12 DIAGNOSIS — M6281 Muscle weakness (generalized): Secondary | ICD-10-CM | POA: Diagnosis not present

## 2015-09-12 DIAGNOSIS — M79642 Pain in left hand: Secondary | ICD-10-CM | POA: Diagnosis not present

## 2015-09-12 DIAGNOSIS — M25642 Stiffness of left hand, not elsewhere classified: Secondary | ICD-10-CM | POA: Diagnosis not present

## 2015-09-12 DIAGNOSIS — M25442 Effusion, left hand: Secondary | ICD-10-CM | POA: Diagnosis not present

## 2015-09-16 DIAGNOSIS — M25642 Stiffness of left hand, not elsewhere classified: Secondary | ICD-10-CM | POA: Diagnosis not present

## 2015-09-16 DIAGNOSIS — M6281 Muscle weakness (generalized): Secondary | ICD-10-CM | POA: Diagnosis not present

## 2015-09-16 DIAGNOSIS — M25442 Effusion, left hand: Secondary | ICD-10-CM | POA: Diagnosis not present

## 2015-09-16 DIAGNOSIS — M79642 Pain in left hand: Secondary | ICD-10-CM | POA: Diagnosis not present

## 2015-09-23 DIAGNOSIS — E559 Vitamin D deficiency, unspecified: Secondary | ICD-10-CM | POA: Diagnosis not present

## 2015-09-23 DIAGNOSIS — K219 Gastro-esophageal reflux disease without esophagitis: Secondary | ICD-10-CM | POA: Diagnosis not present

## 2015-09-23 DIAGNOSIS — Z23 Encounter for immunization: Secondary | ICD-10-CM | POA: Diagnosis not present

## 2015-09-23 DIAGNOSIS — J069 Acute upper respiratory infection, unspecified: Secondary | ICD-10-CM | POA: Diagnosis not present

## 2015-09-23 DIAGNOSIS — J309 Allergic rhinitis, unspecified: Secondary | ICD-10-CM | POA: Diagnosis not present

## 2015-09-23 DIAGNOSIS — E785 Hyperlipidemia, unspecified: Secondary | ICD-10-CM | POA: Diagnosis not present

## 2015-09-23 DIAGNOSIS — Z1389 Encounter for screening for other disorder: Secondary | ICD-10-CM | POA: Diagnosis not present

## 2015-09-23 DIAGNOSIS — Z853 Personal history of malignant neoplasm of breast: Secondary | ICD-10-CM | POA: Diagnosis not present

## 2015-09-23 DIAGNOSIS — Z8673 Personal history of transient ischemic attack (TIA), and cerebral infarction without residual deficits: Secondary | ICD-10-CM | POA: Diagnosis not present

## 2015-09-23 DIAGNOSIS — N183 Chronic kidney disease, stage 3 (moderate): Secondary | ICD-10-CM | POA: Diagnosis not present

## 2015-10-10 ENCOUNTER — Other Ambulatory Visit: Payer: Self-pay

## 2015-10-11 ENCOUNTER — Other Ambulatory Visit: Payer: Self-pay | Admitting: Family Medicine

## 2015-10-11 DIAGNOSIS — Z853 Personal history of malignant neoplasm of breast: Secondary | ICD-10-CM

## 2015-10-11 DIAGNOSIS — N644 Mastodynia: Secondary | ICD-10-CM

## 2015-10-13 ENCOUNTER — Other Ambulatory Visit: Payer: Self-pay | Admitting: Family Medicine

## 2015-10-13 ENCOUNTER — Other Ambulatory Visit: Payer: Self-pay

## 2015-10-13 DIAGNOSIS — Z853 Personal history of malignant neoplasm of breast: Secondary | ICD-10-CM

## 2015-10-13 DIAGNOSIS — N644 Mastodynia: Secondary | ICD-10-CM

## 2015-10-17 ENCOUNTER — Ambulatory Visit
Admission: RE | Admit: 2015-10-17 | Discharge: 2015-10-17 | Disposition: A | Discharge status: Home / Self Care | Payer: Medicare Other | Source: Ambulatory Visit | Attending: Family Medicine | Admitting: Family Medicine

## 2015-10-17 DIAGNOSIS — R928 Other abnormal and inconclusive findings on diagnostic imaging of breast: Secondary | ICD-10-CM | POA: Diagnosis not present

## 2015-10-17 DIAGNOSIS — N644 Mastodynia: Secondary | ICD-10-CM

## 2015-10-17 DIAGNOSIS — Z853 Personal history of malignant neoplasm of breast: Secondary | ICD-10-CM

## 2016-02-23 ENCOUNTER — Other Ambulatory Visit: Payer: Self-pay | Admitting: Family Medicine

## 2016-02-23 ENCOUNTER — Ambulatory Visit
Admission: RE | Admit: 2016-02-23 | Discharge: 2016-02-23 | Disposition: A | Discharge status: Home / Self Care | Payer: Medicare Other | Source: Ambulatory Visit | Attending: Family Medicine | Admitting: Family Medicine

## 2016-02-23 DIAGNOSIS — H43392 Other vitreous opacities, left eye: Secondary | ICD-10-CM | POA: Diagnosis not present

## 2016-02-23 DIAGNOSIS — S0181XA Laceration without foreign body of other part of head, initial encounter: Secondary | ICD-10-CM | POA: Diagnosis not present

## 2016-02-23 DIAGNOSIS — S0512XA Contusion of eyeball and orbital tissues, left eye, initial encounter: Secondary | ICD-10-CM

## 2016-02-23 DIAGNOSIS — M3501 Sicca syndrome with keratoconjunctivitis: Secondary | ICD-10-CM | POA: Diagnosis not present

## 2016-02-23 DIAGNOSIS — W19XXXA Unspecified fall, initial encounter: Secondary | ICD-10-CM | POA: Diagnosis not present

## 2016-02-23 DIAGNOSIS — H43812 Vitreous degeneration, left eye: Secondary | ICD-10-CM | POA: Diagnosis not present

## 2016-02-23 DIAGNOSIS — H04123 Dry eye syndrome of bilateral lacrimal glands: Secondary | ICD-10-CM | POA: Diagnosis not present

## 2016-02-23 IMAGING — CT CT ORBITS W/O CM
1 series · 15 of 24 positions shown, 19 images · non-contrast
Comparison: None.

CLINICAL DATA: Fall this a.m., left orbit contusion, possible
fracture, hit left side face

EXAM:
CT ORBITS WITHOUT CONTRAST
TECHNIQUE: Multidetector CT imaging of the orbits was performed following the
standard protocol without intravenous contrast.

[Series 3: orbits-soft · axial · 0.31mm/px · z∈[-125,-62]mm · 15 of 24 slices shown, 19 images]
[im 2/24  brain]
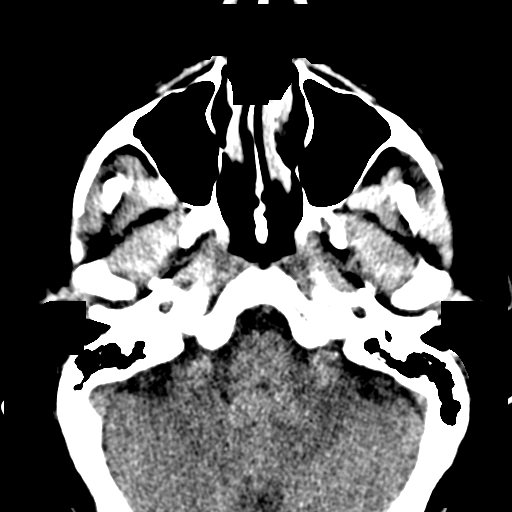
[im 2/24  bone]
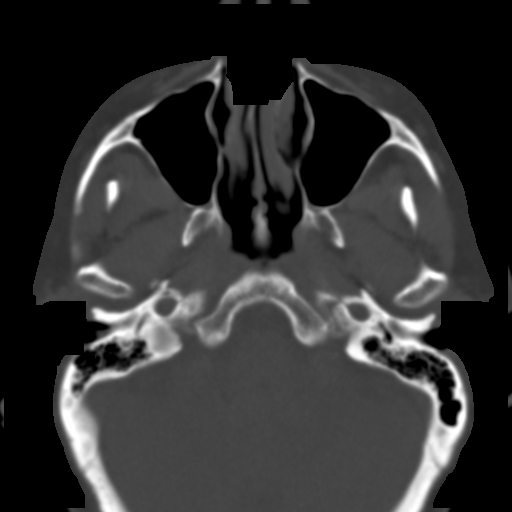
[im 4/24  bone]
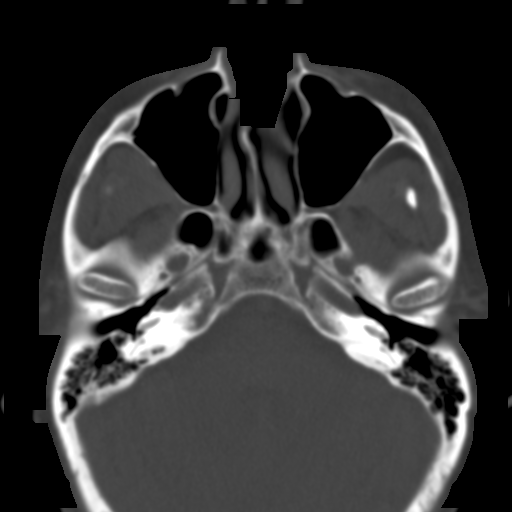
[im 5/24  bone]
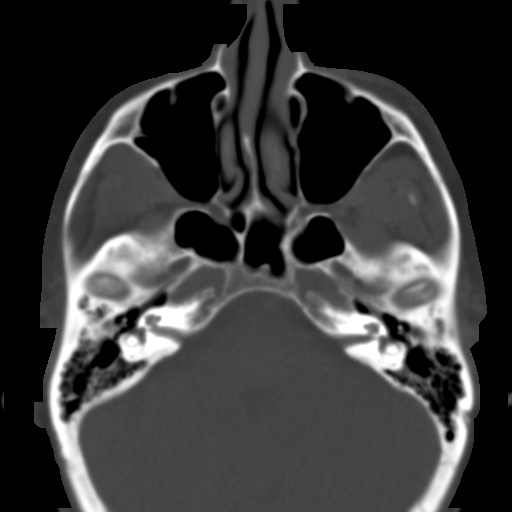
[im 7/24  bone]
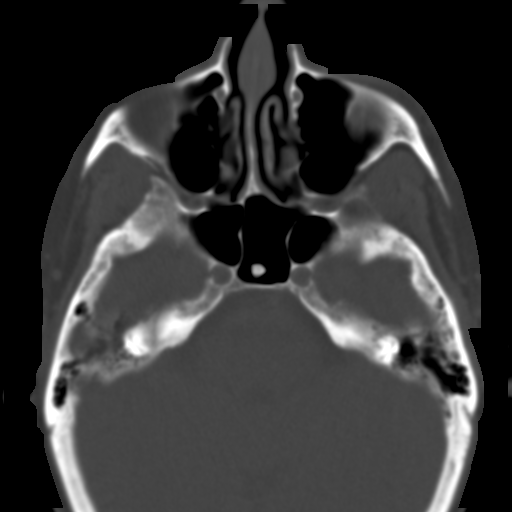
[im 8/24  brain]
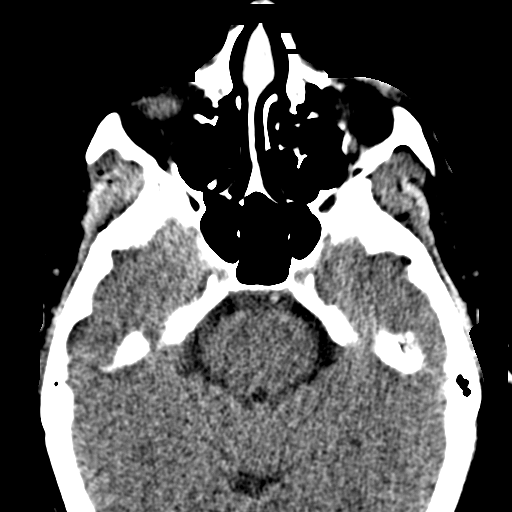
[im 8/24  bone]
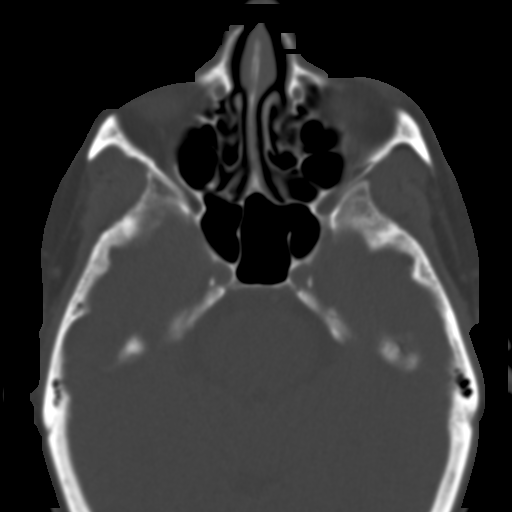
[im 10/24  bone]
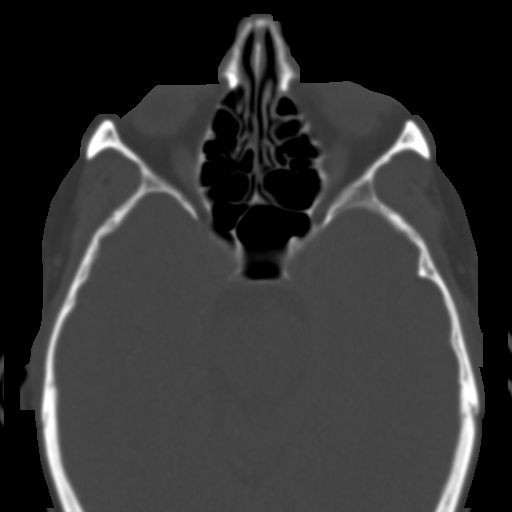
[im 11/24  bone]
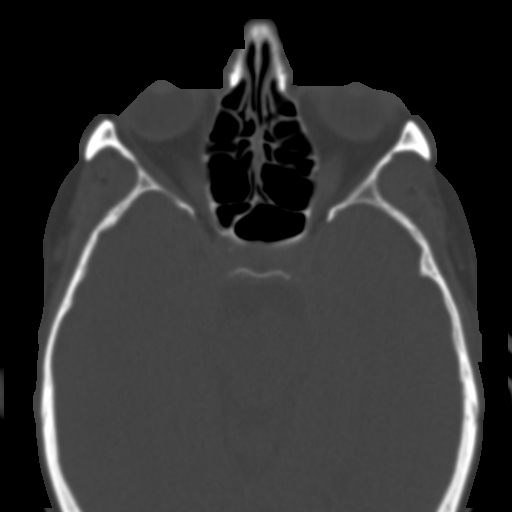
[im 13/24  bone]
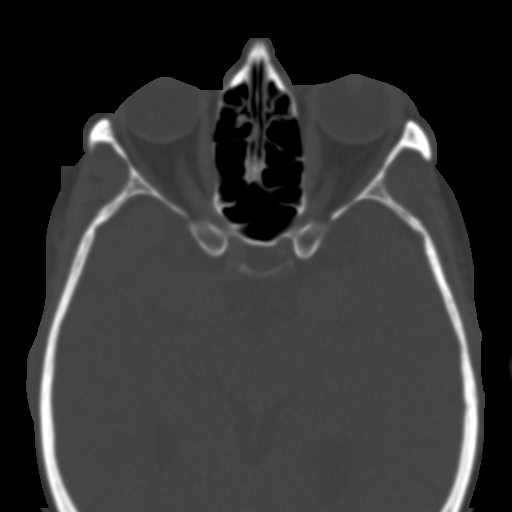
[im 14/24  brain]
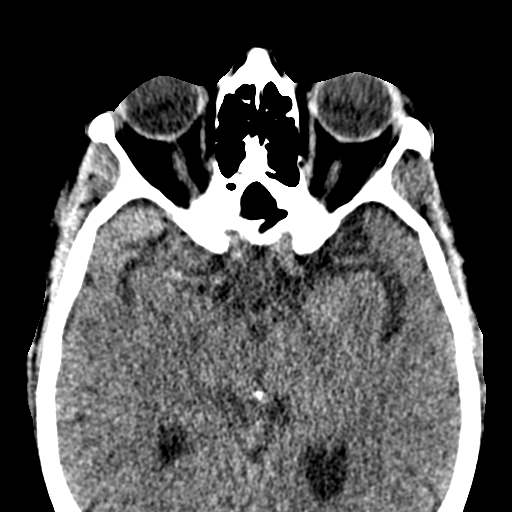
[im 14/24  bone]
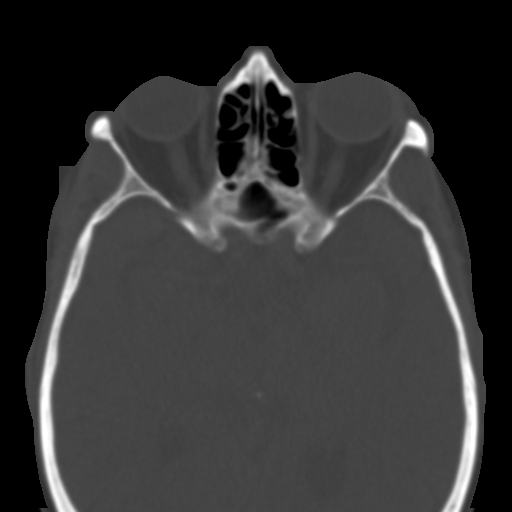
[im 15/24  bone]
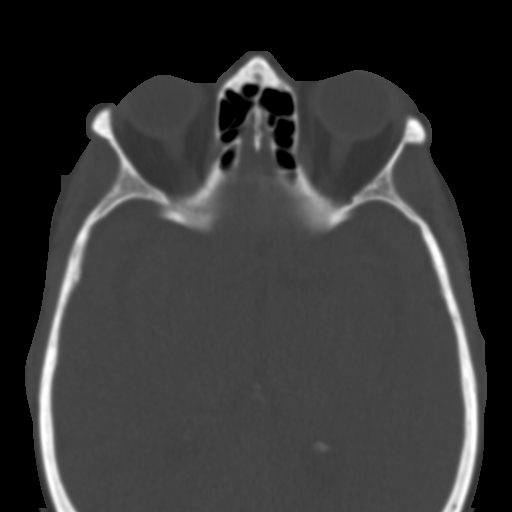
[im 17/24  bone]
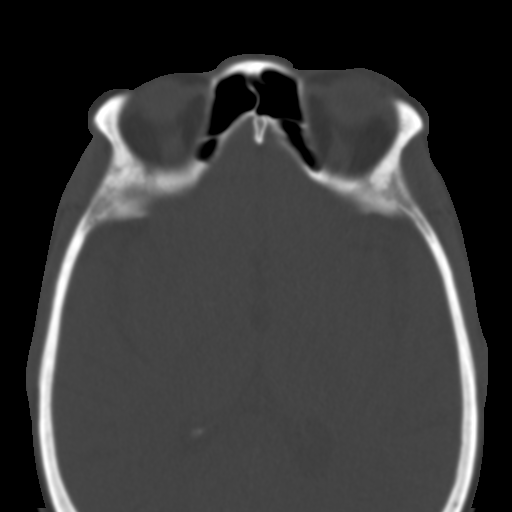
[im 18/24  bone]
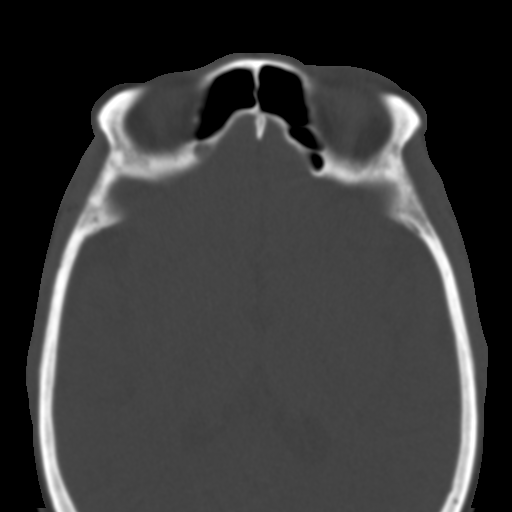
[im 20/24  brain]
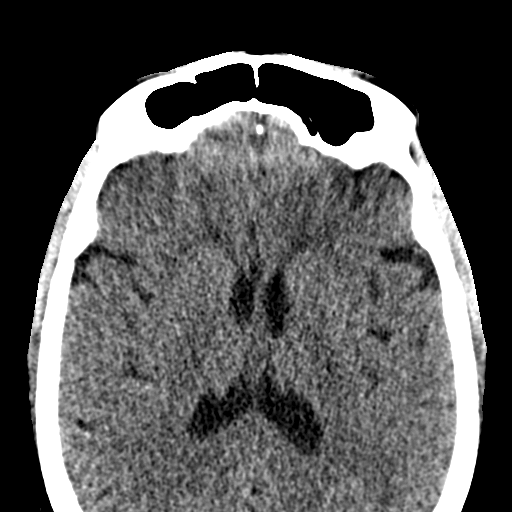
[im 20/24  bone]
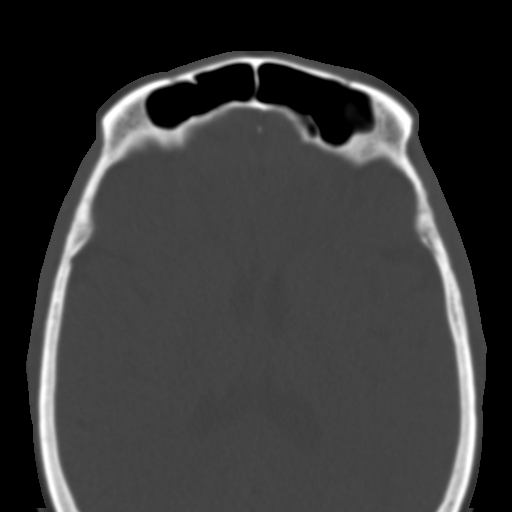
[im 21/24  bone]
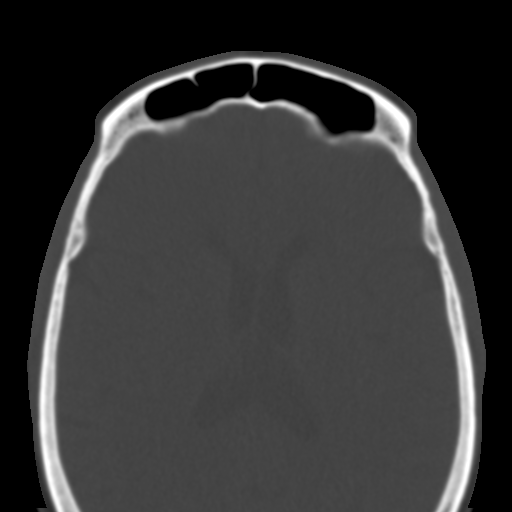
[im 23/24  bone]
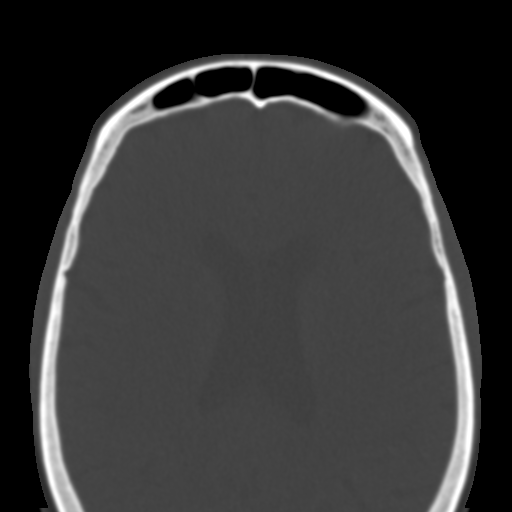

[15 of 24 positions shown; findings below may reference images not displayed]

FINDINGS: Axial images shows no acute fractures. There is no nasal bone
fracture. There is right deviation of nasal bony septum. No
intraorbital hematoma.

No paranasal sinuses air-fluid levels are noted.

Bilateral eye globe is symmetrical in appearance.

Coronal images shows no orbital rim or orbital floor fracture.
Bilateral semilunar canal is patent. Again noted right deviation of
nasal bony septum. The visualized nasal turbinates are unremarkable.

Bilateral lamina papyracea appears intact. No TMJ dislocation in
partially visualized bilateral TMJ. There is mild soft tissue
swelling left periorbital lateral.
IMPRESSION: 1. No acute fractures are noted. No intraorbital hematoma. Mild soft
tissue swelling left periorbital lateral. Please see axial image 12.
2. No orbital rim or orbital floor fracture. There is right
deviation of nasal bony septum.
3. No paranasal sinuses air-fluid levels. Unremarkable nasal
turbinates. Bilateral semilunar canal is patent.

## 2016-03-09 DIAGNOSIS — H43812 Vitreous degeneration, left eye: Secondary | ICD-10-CM | POA: Diagnosis not present

## 2016-03-09 DIAGNOSIS — M3501 Sicca syndrome with keratoconjunctivitis: Secondary | ICD-10-CM | POA: Diagnosis not present

## 2016-03-09 DIAGNOSIS — H04123 Dry eye syndrome of bilateral lacrimal glands: Secondary | ICD-10-CM | POA: Diagnosis not present

## 2016-03-09 DIAGNOSIS — S0512XA Contusion of eyeball and orbital tissues, left eye, initial encounter: Secondary | ICD-10-CM | POA: Diagnosis not present

## 2016-03-23 DIAGNOSIS — Z8673 Personal history of transient ischemic attack (TIA), and cerebral infarction without residual deficits: Secondary | ICD-10-CM | POA: Diagnosis not present

## 2016-03-23 DIAGNOSIS — E785 Hyperlipidemia, unspecified: Secondary | ICD-10-CM | POA: Diagnosis not present

## 2016-03-23 DIAGNOSIS — Z853 Personal history of malignant neoplasm of breast: Secondary | ICD-10-CM | POA: Diagnosis not present

## 2016-03-23 DIAGNOSIS — N183 Chronic kidney disease, stage 3 (moderate): Secondary | ICD-10-CM | POA: Diagnosis not present

## 2016-03-23 DIAGNOSIS — J309 Allergic rhinitis, unspecified: Secondary | ICD-10-CM | POA: Diagnosis not present

## 2016-03-23 DIAGNOSIS — K219 Gastro-esophageal reflux disease without esophagitis: Secondary | ICD-10-CM | POA: Diagnosis not present

## 2016-03-23 DIAGNOSIS — E559 Vitamin D deficiency, unspecified: Secondary | ICD-10-CM | POA: Diagnosis not present

## 2016-03-29 ENCOUNTER — Ambulatory Visit (INDEPENDENT_AMBULATORY_CARE_PROVIDER_SITE_OTHER): Payer: Medicare Other | Admitting: Neurology

## 2016-03-29 ENCOUNTER — Encounter: Payer: Self-pay | Admitting: Neurology

## 2016-03-29 VITALS — BP 128/79 | HR 88 | Resp 16 | Ht 65.0 in | Wt 185.0 lb

## 2016-03-29 DIAGNOSIS — R269 Unspecified abnormalities of gait and mobility: Secondary | ICD-10-CM | POA: Diagnosis not present

## 2016-03-29 DIAGNOSIS — W19XXXD Unspecified fall, subsequent encounter: Secondary | ICD-10-CM

## 2016-03-29 DIAGNOSIS — W19XXXA Unspecified fall, initial encounter: Secondary | ICD-10-CM | POA: Insufficient documentation

## 2016-03-29 NOTE — Progress Notes (Signed)
PATIENT: Yvonne Jordan DOB: Apr 05, 1946  Chief Complaint  Patient presents with  . Fall    Rm 4 New Patient. Patient is unaccompanied today. Patient reports that she has a history of falls. She has had balance issues for 30 years and states that her dad had the same thing.      HISTORICAL  Yvonne Jordan is a 70 years old left-handed female, seen in refer by her primary care physician Dr. Antony Contras for evaluation of frequent falling episode in March 29 2016  She had a history of TIA in 2005, presented with transient left hemi-paresthesias, history of left breast cancer, status post lobectomy followed by radiation therapy  Since 2011, she had intermittent falling episode, gradually getting worse, in 2016, she had 3-4 major falls, to the point of broken glasses and bruise skulls, she also has multiple near falling episode, she has to catch herself, she could not characterize why she had fell, tends to fall to the left side, no warning signs, no loss of consciousness.  She reported that her dad has a tendency to fall around age 31, her father passed away from heart attack at 70,  Patient denied dysarthria, no dysphagia, no visual change, no upper or lower extremity sensory loss or weakness, she has right side neck pain, radiating pain to right shoulder, she denies bowel and bladder incontinence  REVIEW OF SYSTEMS: Full 14 system review of systems performed and notable only for Weight gain, ringing ears, shortness of breath, dizziness  ALLERGIES: No Known Allergies  HOME MEDICATIONS: Current Outpatient Prescriptions  Medication Sig Dispense Refill  . Ascorbic Acid (VITAMIN C) 1000 MG tablet Take 1,000 mg by mouth daily.    . Cholecalciferol (VITAMIN D3) 2000 UNITS TABS Take by mouth.    . clopidogrel (PLAVIX) 75 MG tablet Take 75 mg by mouth daily with breakfast.    . Cyanocobalamin (VITAMIN B12 PO) Take 5,000 mcg by mouth.    . Multiple Vitamin (MULTIVITAMIN) capsule Take 1  capsule by mouth daily.    . pantoprazole (PROTONIX) 40 MG tablet Take 1 tablet (40 mg total) by mouth 2 (two) times daily before a meal. For 30 days 180 tablet 3   No current facility-administered medications for this visit.    PAST MEDICAL HISTORY: Past Medical History  Diagnosis Date  . TIA (transient ischemic attack)   . Vitamin D deficiency   . Allergic rhinitis   . CKD (chronic kidney disease)   . Colon polyp   . Hyperlipidemia   . Cancer [facility])     Breast    PAST SURGICAL HISTORY: Past Surgical History  Procedure Laterality Date  . Breast surgery      2006...treated with Tamoxifen for 5 years  . Cholecystectomy      FAMILY HISTORY: Family History  Problem Relation Age of Onset  . Breast cancer Sister   . Heart attack Sister   . Heart disease Father   . Stroke Father   . Heart disease Maternal Uncle   . Heart disease Maternal Uncle   . Heart disease Maternal Uncle   . Stroke Mother   . Deep vein thrombosis Mother     SOCIAL HISTORY:  Social History   Social History  . Marital Status: Married    Spouse Name: N/A  . Number of Children: 2  . Years of Education: N/A   Occupational History  . Sales    Social History Main Topics  . Smoking status: Never Smoker   .  Smokeless tobacco: Never Used  . Alcohol Use: 0.0 oz/week    0 Standard drinks or equivalent per week     Comment: 1 drink per wk  . Drug Use: No  . Sexual Activity: Not on file   Other Topics Concern  . Not on file   Social History Narrative   Drinks about 2 cups of coffee a day      PHYSICAL EXAM   Filed Vitals:   03/29/16 0733  BP: 128/79  Pulse: 88  Resp: 16  Height: 5\' 5"  (1.651 m)  Weight: 185 lb (83.915 kg)    Not recorded      Body mass index is 30.79 kg/(m^2).  PHYSICAL EXAMNIATION:  Gen: NAD, conversant, well nourised, obese, well groomed                     Cardiovascular: Regular rate rhythm, no peripheral edema, warm, nontender. Eyes: Conjunctivae clear  without exudates or hemorrhage Neck: Supple, no carotid bruise. Pulmonary: Clear to auscultation bilaterally   NEUROLOGICAL EXAM:  MENTAL STATUS: Speech:    Speech is normal; fluent and spontaneous with normal comprehension.  Cognition:     Orientation to time, place and person     Normal recent and remote memory     Normal Attention span and concentration     Normal Language, naming, repeating,spontaneous speech     Fund of knowledge   CRANIAL NERVES: CN II: Visual fields are full to confrontation. Fundoscopic exam is normal with sharp discs and no vascular changes. Pupils are round equal and briskly reactive to light. CN III, IV, VI: extraocular movement are normal. No ptosis. CN V: Facial sensation is intact to pinprick in all 3 divisions bilaterally. Corneal responses are intact.  CN VII: Face is symmetric with normal eye closure and smile. CN VIII: Hearing is normal to rubbing fingers CN IX, X: Palate elevates symmetrically. Phonation is normal. CN XI: Head turning and shoulder shrug are intact CN XII: Tongue is midline with normal movements and no atrophy.  MOTOR: There is no pronator drift of out-stretched arms. Muscle bulk and tone are normal. Muscle strength is normal.  REFLEXES: Reflexes are 3 and symmetric at the biceps, triceps, knees, and ankles. Plantar responses are flexor.  SENSORY: Intact to light touch, pinprick, positional sensation and vibratory sensation are intact in fingers and toes.  COORDINATION: Rapid alternating movements and fine finger movements are intact. There is no dysmetria on finger-to-nose and heel-knee-shin.    GAIT/STANCE: Posture is normal. Gait is steady with mildly stiff, normal arm swing and turning. She could not perform tiptoe, heel, tandem walking, Romberg is absent.   DIAGNOSTIC DATA (LABS, IMAGING, TESTING) - I reviewed patient records, labs, notes, testing and imaging myself where available.   ASSESSMENT AND  PLAN  Yvonne Jordan is a 70 y.o. female   Falling episodes  Complained of neck pain, hyperreflexia on examination, also history of TIA  Localized the problem to central nervous system, proceed with MRI of the brain and cervical spine Cervical dystonia  She has mild left tilt, mild left shoulder elevation    Marcial Pacas, M.D. Ph.D.  [facility] [address] [place], [place] [address] Ph: [phone] Fax: [phone]  CC: Antony Contras, MD

## 2016-04-06 ENCOUNTER — Ambulatory Visit
Admission: RE | Admit: 2016-04-06 | Discharge: 2016-04-06 | Disposition: A | Discharge status: Home / Self Care | Payer: Medicare Other | Source: Ambulatory Visit | Attending: Neurology | Admitting: Neurology

## 2016-04-06 DIAGNOSIS — R269 Unspecified abnormalities of gait and mobility: Secondary | ICD-10-CM

## 2016-04-06 DIAGNOSIS — W19XXXD Unspecified fall, subsequent encounter: Secondary | ICD-10-CM | POA: Diagnosis not present

## 2016-04-06 DIAGNOSIS — S199XXA Unspecified injury of neck, initial encounter: Secondary | ICD-10-CM | POA: Diagnosis not present

## 2016-04-09 ENCOUNTER — Telehealth: Payer: Self-pay | Admitting: Neurology

## 2016-04-09 NOTE — Telephone Encounter (Signed)
Please call patient, MRI of the brain showed mild age-related changes, MRI of the cervical spine showed multilevel degenerative disc disease, there was no evidence of spinal cord compression. I will reveal findings with her in detail at her next follow-up visit

## 2016-04-10 NOTE — Telephone Encounter (Signed)
Left message for a return call

## 2016-04-10 NOTE — Telephone Encounter (Signed)
Spoke to patient - she is aware of results and will keep her follow up appt to further discuss. 

## 2016-04-19 ENCOUNTER — Ambulatory Visit (INDEPENDENT_AMBULATORY_CARE_PROVIDER_SITE_OTHER): Payer: Medicare Other | Admitting: Neurology

## 2016-04-19 ENCOUNTER — Encounter: Payer: Self-pay | Admitting: Neurology

## 2016-04-19 VITALS — BP 128/76 | HR 78 | Ht 65.0 in | Wt 186.2 lb

## 2016-04-19 DIAGNOSIS — M545 Low back pain, unspecified: Secondary | ICD-10-CM | POA: Insufficient documentation

## 2016-04-19 DIAGNOSIS — R269 Unspecified abnormalities of gait and mobility: Secondary | ICD-10-CM

## 2016-04-19 DIAGNOSIS — W19XXXD Unspecified fall, subsequent encounter: Secondary | ICD-10-CM

## 2016-04-19 DIAGNOSIS — M5441 Lumbago with sciatica, right side: Secondary | ICD-10-CM

## 2016-04-19 NOTE — Progress Notes (Signed)
PATIENT: Yvonne Jordan DOB: 08-Nov-1946  Chief Complaint  Patient presents with  . Abnormality of gait    She is here to review her MRI results.     HISTORICAL  Yvonne Jordan is a 70 years old left-handed female, seen in refer by her primary care physician Dr. Antony Contras for evaluation of frequent falling episode in March 29 2016  She had a history of TIA in 2005, presented with transient left hemi-paresthesias, history of left breast cancer, status post lobectomy followed by radiation therapy  Since 2011, she had intermittent falling episode, gradually getting worse, in 2016, she had 3-4 major falls, to the point of broken glasses and bruise skulls, she also has multiple near falling episode, she has to catch herself, she could not characterize why she had fell, tends to fall to the left side, no warning signs, no loss of consciousness.  She reported that her dad has a tendency to fall around age fifties, her father passed away from heart attack at 31,  Patient denied dysarthria, no dysphagia, no visual change, no upper or lower extremity sensory loss or weakness, she has right side neck pain, radiating pain to right shoulder, she denies bowel and bladder incontinence  UPDATE April 27th 2017: She tends to trip with her left foot, has mild low back pain, recently right lateral leg burning deep achy pain, no bowel and bladder incontinence,  We have personally reviewed MRI of the brain without contrast in April 2017, mild atrophy, supratentorium small vessel disease. MRI of the cervical spine, mild degenerative changes, no significant canal or foraminal stenosis.   REVIEW OF SYSTEMS: Full 14 system review of systems performed and notable only for as above  ALLERGIES: No Known Allergies  HOME MEDICATIONS: Current Outpatient Prescriptions  Medication Sig Dispense Refill  . Ascorbic Acid (VITAMIN C) 1000 MG tablet Take 1,000 mg by mouth daily.    . Cholecalciferol (VITAMIN D3) 2000  UNITS TABS Take by mouth.    . clopidogrel (PLAVIX) 75 MG tablet Take 75 mg by mouth daily with breakfast.    . Cyanocobalamin (VITAMIN B12 PO) Take 5,000 mcg by mouth.    . Multiple Vitamin (MULTIVITAMIN) capsule Take 1 capsule by mouth daily.    . pantoprazole (PROTONIX) 40 MG tablet Take 1 tablet (40 mg total) by mouth 2 (two) times daily before a meal. For 30 days 180 tablet 3   No current facility-administered medications for this visit.    PAST MEDICAL HISTORY: Past Medical History  Diagnosis Date  . TIA (transient ischemic attack)   . Vitamin D deficiency   . Allergic rhinitis   . CKD (chronic kidney disease)   . Colon polyp   . Hyperlipidemia   . Cancer [facility])     Breast    PAST SURGICAL HISTORY: Past Surgical History  Procedure Laterality Date  . Breast surgery      2006...treated with Tamoxifen for 5 years  . Cholecystectomy      FAMILY HISTORY: Family History  Problem Relation Age of Onset  . Breast cancer Sister   . Heart attack Sister   . Heart disease Father   . Stroke Father   . Heart disease Maternal Uncle   . Heart disease Maternal Uncle   . Heart disease Maternal Uncle   . Stroke Mother   . Deep vein thrombosis Mother     SOCIAL HISTORY:  Social History   Social History  . Marital Status: Married    Spouse  Name: N/A  . Number of Children: 2  . Years of Education: N/A   Occupational History  . Sales    Social History Main Topics  . Smoking status: Never Smoker   . Smokeless tobacco: Never Used  . Alcohol Use: 0.0 oz/week    0 Standard drinks or equivalent per week     Comment: 1 drink per wk  . Drug Use: No  . Sexual Activity: Not on file   Other Topics Concern  . Not on file   Social History Narrative   Drinks about 2 cups of coffee a day      PHYSICAL EXAM   Filed Vitals:   04/19/16 0910  BP: 128/76  Pulse: 78  Height: 5\' 5"  (1.651 m)  Weight: 186 lb 4 oz (84.482 kg)    Not recorded      Body mass index is 30.99  kg/(m^2).  PHYSICAL EXAMNIATION:  Gen: NAD, conversant, well nourised, obese, well groomed                     Cardiovascular: Regular rate rhythm, no peripheral edema, warm, nontender. Eyes: Conjunctivae clear without exudates or hemorrhage Neck: Supple, no carotid bruise. Pulmonary: Clear to auscultation bilaterally   NEUROLOGICAL EXAM:  MENTAL STATUS: Speech:    Speech is normal; fluent and spontaneous with normal comprehension.  Cognition:     Orientation to time, place and person     Normal recent and remote memory     Normal Attention span and concentration     Normal Language, naming, repeating,spontaneous speech     Fund of knowledge   CRANIAL NERVES: CN II: Visual fields are full to confrontation. Fundoscopic exam is normal with sharp discs and no vascular changes. Pupils are round equal and briskly reactive to light. CN III, IV, VI: extraocular movement are normal. No ptosis. CN V: Facial sensation is intact to pinprick in all 3 divisions bilaterally. Corneal responses are intact.  CN VII: Face is symmetric with normal eye closure and smile. CN VIII: Hearing is normal to rubbing fingers CN IX, X: Palate elevates symmetrically. Phonation is normal. CN XI: Head turning and shoulder shrug are intact CN XII: Tongue is midline with normal movements and no atrophy.  MOTOR: There is no pronator drift of out-stretched arms. Muscle bulk and tone are normal. Muscle strength is normal, with exception of mild toe flexion extension weakness.  REFLEXES: Reflexes are 3 and symmetric at the biceps, triceps, knees, and ankles. Plantar responses are flexor.  SENSORY: Intact to light touch, pinprick, positional sensation and vibratory sensation are intact in fingers and toes.  COORDINATION: Rapid alternating movements and fine finger movements are intact. There is no dysmetria on finger-to-nose and heel-knee-shin.    GAIT/STANCE: Posture is normal. Gait is steady with mildly  stiff, normal arm swing and turning. She could not perform tiptoe, heel, tandem walking, Romberg is absent.   DIAGNOSTIC DATA (LABS, IMAGING, TESTING) - I reviewed patient records, labs, notes, testing and imaging myself where available.   ASSESSMENT AND PLAN  Yvonne Jordan is a 70 y.o. female   Falling episodes  Chronic low back pain, right lateral leg pain,  Differentiation diagnosis including lumbar sacral radiculopathy,  Proceed with MRI of lumbar, EMG nerve conduction study   Marcial Pacas, M.D. Ph.D.  [facility] [address] [place], [place] [address] Ph: [phone] Fax: [phone]  CC: Antony Contras, MD

## 2016-04-27 ENCOUNTER — Ambulatory Visit
Admission: RE | Admit: 2016-04-27 | Discharge: 2016-04-27 | Disposition: A | Discharge status: Home / Self Care | Payer: Medicare Other | Source: Ambulatory Visit | Attending: Neurology | Admitting: Neurology

## 2016-04-27 DIAGNOSIS — M5441 Lumbago with sciatica, right side: Secondary | ICD-10-CM

## 2016-04-27 DIAGNOSIS — W19XXXD Unspecified fall, subsequent encounter: Secondary | ICD-10-CM

## 2016-04-27 DIAGNOSIS — R269 Unspecified abnormalities of gait and mobility: Secondary | ICD-10-CM | POA: Diagnosis not present

## 2016-05-01 ENCOUNTER — Telehealth: Payer: Self-pay | Admitting: *Deleted

## 2016-05-01 NOTE — Telephone Encounter (Signed)
-----   Message from Marcial Pacas, MD sent at 05/01/2016  2:13 PM EDT ----- Please call pt: MRI lumbar showed mild degenerative changes, no significant foraminal canal stenosis.

## 2016-05-01 NOTE — Telephone Encounter (Signed)
Left message for a return call

## 2016-05-02 NOTE — Telephone Encounter (Signed)
Pt returned Michelle;s call.

## 2016-05-02 NOTE — Telephone Encounter (Signed)
Spoke to patient she is aware of results

## 2016-05-14 ENCOUNTER — Ambulatory Visit (INDEPENDENT_AMBULATORY_CARE_PROVIDER_SITE_OTHER): Payer: Medicare Other | Admitting: Neurology

## 2016-05-14 DIAGNOSIS — R269 Unspecified abnormalities of gait and mobility: Secondary | ICD-10-CM

## 2016-05-14 DIAGNOSIS — M5441 Lumbago with sciatica, right side: Secondary | ICD-10-CM

## 2016-05-14 DIAGNOSIS — W19XXXD Unspecified fall, subsequent encounter: Secondary | ICD-10-CM

## 2016-05-14 NOTE — Procedures (Signed)
   NCS (NERVE CONDUCTION STUDY) WITH EMG (ELECTROMYOGRAPHY) REPORT   STUDY DATE:  May 22nd 2017 PATIENT NAME: Yvonne Jordan DOB: 28-Feb-1946 MRN: [id number]   TECHNOLOGIST: Laretta Alstrom ELECTROMYOGRAPHER: Marcial Pacas M.D.  CLINICAL INFORMATION:  70 years old right-handed female presented with mild gait difficulty, chronic low back pain.  FINDINGS: NERVE CONDUCTION STUDY: Bilateral peroneal sensory responses were normal. Bilateral peroneal to EDB and tibial motor responses were normal.  Bilateral H reflexes were normal and symmetric.   NEEDLE ELECTROMYOGRAPHY: Selected needle examinations were performed at bilateral lower extremity muscles and bilateral lumbar sacral paraspinal muscles.   Needle examination of bilateral tibialis anterior, tibialis posterior, medial gastrocnemius, vastus lateralis, biceps femoris long head were normal.  There was no spontaneous activity at bilateral lumbosacral paraspinal muscles, bilateral L4-5 S1.  IMPRESSION:  This is a normal study. There is no electrodiagnostic evidence of lumbosacral radiculopathy or large fiber peripheral neuropathy.    INTERPRETING PHYSICIAN:   Marcial Pacas M.D. Ph.D. [facility] [address] [place], [place] [address] [phone]

## 2016-05-14 NOTE — Progress Notes (Signed)
PATIENT: Yvonne Jordan DOB: 11-23-1946  No chief complaint on file.    HISTORICAL  Yvonne Jordan is a 70 years old left-handed female, seen in refer by her primary care physician Dr. Antony Contras for evaluation of frequent falling episode in March 29 2016  She had a history of TIA in 2005, presented with transient left hemi-paresthesias, history of left breast cancer, status post lobectomy followed by radiation therapy  Since 2011, she had intermittent falling episode, gradually getting worse, in 2016, she had 3-4 major falls, to the point of broken glasses and bruise skulls, she also has multiple near falling episode, she has to catch herself, she could not characterize why she had fell, tends to fall to the left side, no warning signs, no loss of consciousness.  She reported that her dad has a tendency to fall around age fifties, her father passed away from heart attack at 71,  Patient denied dysarthria, no dysphagia, no visual change, no upper or lower extremity sensory loss or weakness, she has right side neck pain, radiating pain to right shoulder, she denies bowel and bladder incontinence  UPDATE April 27th 2017: She tends to trip with her left foot, has mild low back pain, recently right lateral leg burning deep achy pain, no bowel and bladder incontinence,  We have personally reviewed MRI of the brain without contrast in April 2017, mild atrophy, supratentorium small vessel disease. MRI of the cervical spine, mild degenerative changes, no significant canal or foraminal stenosis.  Update May 20 second 2017: Patient returned for electrodiagnostic study today, which showed no evidence of large fiber peripheral neuropathy, no evidence of lumbosacral radiculopathy. We also personally reviewed MRI of the brain, mild supratentorium small vessel disease, MRI of thoracic spine showed no significant abnormalities  REVIEW OF SYSTEMS: Full 14 system review of systems performed and notable  only for as above  ALLERGIES: No Known Allergies  HOME MEDICATIONS: Current Outpatient Prescriptions  Medication Sig Dispense Refill  . Ascorbic Acid (VITAMIN C) 1000 MG tablet Take 1,000 mg by mouth daily.    . Cholecalciferol (VITAMIN D3) 2000 UNITS TABS Take by mouth.    . clopidogrel (PLAVIX) 75 MG tablet Take 75 mg by mouth daily with breakfast.    . Cyanocobalamin (VITAMIN B12 PO) Take 5,000 mcg by mouth.    . Multiple Vitamin (MULTIVITAMIN) capsule Take 1 capsule by mouth daily.    . pantoprazole (PROTONIX) 40 MG tablet Take 1 tablet (40 mg total) by mouth 2 (two) times daily before a meal. For 30 days 180 tablet 3   No current facility-administered medications for this visit.    PAST MEDICAL HISTORY: Past Medical History  Diagnosis Date  . TIA (transient ischemic attack)   . Vitamin D deficiency   . Allergic rhinitis   . CKD (chronic kidney disease)   . Colon polyp   . Hyperlipidemia   . Cancer [facility])     Breast    PAST SURGICAL HISTORY: Past Surgical History  Procedure Laterality Date  . Breast surgery      2006...treated with Tamoxifen for 5 years  . Cholecystectomy      FAMILY HISTORY: Family History  Problem Relation Age of Onset  . Breast cancer Sister   . Heart attack Sister   . Heart disease Father   . Stroke Father   . Heart disease Maternal Uncle   . Heart disease Maternal Uncle   . Heart disease Maternal Uncle   . Stroke Mother   .  Deep vein thrombosis Mother     SOCIAL HISTORY:  Social History   Social History  . Marital Status: Married    Spouse Name: N/A  . Number of Children: 2  . Years of Education: N/A   Occupational History  . Sales    Social History Main Topics  . Smoking status: Never Smoker   . Smokeless tobacco: Never Used  . Alcohol Use: 0.0 oz/week    0 Standard drinks or equivalent per week     Comment: 1 drink per wk  . Drug Use: No  . Sexual Activity: Not on file   Other Topics Concern  . Not on file    Social History Narrative   Drinks about 2 cups of coffee a day      PHYSICAL EXAM   There were no vitals filed for this visit.  Not recorded      There is no weight on file to calculate BMI.  PHYSICAL EXAMNIATION:  Gen: NAD, conversant, well nourised, obese, well groomed                     Cardiovascular: Regular rate rhythm, no peripheral edema, warm, nontender. Eyes: Conjunctivae clear without exudates or hemorrhage Neck: Supple, no carotid bruise. Pulmonary: Clear to auscultation bilaterally   NEUROLOGICAL EXAM:  MENTAL STATUS: Speech:    Speech is normal; fluent and spontaneous with normal comprehension.  Cognition:     Orientation to time, place and person     Normal recent and remote memory     Normal Attention span and concentration     Normal Language, naming, repeating,spontaneous speech     Fund of knowledge   CRANIAL NERVES: CN II: Visual fields are full to confrontation. Fundoscopic exam is normal with sharp discs and no vascular changes. Pupils are round equal and briskly reactive to light. CN III, IV, VI: extraocular movement are normal. No ptosis. CN V: Facial sensation is intact to pinprick in all 3 divisions bilaterally. Corneal responses are intact.  CN VII: Face is symmetric with normal eye closure and smile. CN VIII: Hearing is normal to rubbing fingers CN IX, X: Palate elevates symmetrically. Phonation is normal. CN XI: Head turning and shoulder shrug are intact CN XII: Tongue is midline with normal movements and no atrophy.  MOTOR: There is no pronator drift of out-stretched arms. Muscle bulk and tone are normal. Muscle strength is normal, with exception of mild toe flexion extension weakness.  REFLEXES: Reflexes are 3 and symmetric at the biceps, triceps, knees, and ankles. Plantar responses are flexor.  SENSORY: Intact to light touch, pinprick, positional sensation and vibratory sensation are intact in fingers and  toes.  COORDINATION: Rapid alternating movements and fine finger movements are intact. There is no dysmetria on finger-to-nose and heel-knee-shin.    GAIT/STANCE: Posture is normal. Gait is steady with mildly stiff, normal arm swing and turning. She could not perform tiptoe, heel, tandem walking, Romberg is absent.   DIAGNOSTIC DATA (LABS, IMAGING, TESTING) - I reviewed patient records, labs, notes, testing and imaging myself where available.   ASSESSMENT AND PLAN  Yvonne Jordan is a 71 y.o. female   Falling episodes  Likely related to deconditioning chronic low back pain, right lateral leg pain,  No evidence of lumbosacral radiculopathy, large fiber neuropathy,  I have suggested her continue moderate exercise   Low Back pain:  Prn NSAIDs, Tylenol  Marcial Pacas, M.D. Ph.D.  [facility] [address], [place],  [place] [address] Ph: [phone] Fax: [phone]  CC: Antony Contras, MD

## 2016-05-14 NOTE — Patient Instructions (Signed)
[employer] 4[employer]3 9 [employer] reviews [facility] in [place], [place]  1.3 mi Address: [address], [place], [place] [address] Hours: Open today  8AM-8PM Phone: [phone] Suggest an edit  Own Yvonne Jordan

## 2016-09-13 DIAGNOSIS — Z853 Personal history of malignant neoplasm of breast: Secondary | ICD-10-CM | POA: Diagnosis not present

## 2016-09-13 DIAGNOSIS — Z8673 Personal history of transient ischemic attack (TIA), and cerebral infarction without residual deficits: Secondary | ICD-10-CM | POA: Diagnosis not present

## 2016-09-13 DIAGNOSIS — Z1389 Encounter for screening for other disorder: Secondary | ICD-10-CM | POA: Diagnosis not present

## 2016-09-13 DIAGNOSIS — Z6831 Body mass index (BMI) 31.0-31.9, adult: Secondary | ICD-10-CM | POA: Diagnosis not present

## 2016-09-13 DIAGNOSIS — Z0000 Encounter for general adult medical examination without abnormal findings: Secondary | ICD-10-CM | POA: Diagnosis not present

## 2016-09-13 DIAGNOSIS — K219 Gastro-esophageal reflux disease without esophagitis: Secondary | ICD-10-CM | POA: Diagnosis not present

## 2016-09-13 DIAGNOSIS — E559 Vitamin D deficiency, unspecified: Secondary | ICD-10-CM | POA: Diagnosis not present

## 2016-09-13 DIAGNOSIS — E785 Hyperlipidemia, unspecified: Secondary | ICD-10-CM | POA: Diagnosis not present

## 2016-09-13 DIAGNOSIS — R42 Dizziness and giddiness: Secondary | ICD-10-CM | POA: Diagnosis not present

## 2016-09-13 DIAGNOSIS — Z23 Encounter for immunization: Secondary | ICD-10-CM | POA: Diagnosis not present

## 2016-09-13 DIAGNOSIS — J309 Allergic rhinitis, unspecified: Secondary | ICD-10-CM | POA: Diagnosis not present

## 2016-09-13 DIAGNOSIS — N183 Chronic kidney disease, stage 3 (moderate): Secondary | ICD-10-CM | POA: Diagnosis not present

## 2017-01-01 ENCOUNTER — Ambulatory Visit (INDEPENDENT_AMBULATORY_CARE_PROVIDER_SITE_OTHER): Payer: Medicare Other

## 2017-01-01 ENCOUNTER — Encounter: Payer: Self-pay | Admitting: Podiatry

## 2017-01-01 ENCOUNTER — Ambulatory Visit (INDEPENDENT_AMBULATORY_CARE_PROVIDER_SITE_OTHER): Payer: Medicare Other | Admitting: Podiatry

## 2017-01-01 DIAGNOSIS — M2012 Hallux valgus (acquired), left foot: Secondary | ICD-10-CM

## 2017-01-01 DIAGNOSIS — M79672 Pain in left foot: Secondary | ICD-10-CM

## 2017-01-01 DIAGNOSIS — Q828 Other specified congenital malformations of skin: Secondary | ICD-10-CM

## 2017-01-01 DIAGNOSIS — M779 Enthesopathy, unspecified: Secondary | ICD-10-CM

## 2017-01-01 DIAGNOSIS — M792 Neuralgia and neuritis, unspecified: Secondary | ICD-10-CM

## 2017-01-01 NOTE — Patient Instructions (Signed)

## 2017-01-01 NOTE — Progress Notes (Signed)
She presents today with chief complaint of pain to the dorsal aspect of the left foot where arthritis is prevalent. She is also complaining of pain to the plantar aspect of the third metatarsophalangeal joint with reactive hyper keratoma in the distal aspect of the third toe left foot.  Objective: Vital signs are stable she is alert and oriented 3. Pulses are palpable. Neurologic system is intact and reflexes are intact. Malleted deformity third digit left foot resulting and distal clavus and reactive hyper keratoma sub-third met left she does have a very large dorsal exostosis left foot.  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  Plan: I injected the dorsal aspect of the foot today with Kenalog and local anesthetic debrided all reactive hyperkeratoses. We also consented her for a dorsal tarsal exostectomy DIPJ arthroplasty third digit left. I debrided all reactive hyperkeratoses for her today. We discussed the possible postoperative complications which may include but are not limited to postop pain bleeding swelling fashion recurrence need for further surgery overcorrection or under correction loss of digit loss of limb loss of life. She understands this is amenable to it signed all 3 patient the consent form. She will stop her Plavix one week in advance of the surgery and I will follow-up with her at the time of surgery. We did discuss anesthesia and his drains were especially surgical center she understands that she has been there before.

## 2017-01-03 ENCOUNTER — Telehealth: Payer: Self-pay | Admitting: *Deleted

## 2017-01-03 NOTE — Telephone Encounter (Signed)
"  My wife was there 2 days ago to see Dr. Milinda Pointer.  She scheduled surgery for February 2.  We need to change it to February 16 if it's available."

## 2017-01-14 ENCOUNTER — Other Ambulatory Visit: Payer: Self-pay | Admitting: Family Medicine

## 2017-01-14 DIAGNOSIS — Z1231 Encounter for screening mammogram for malignant neoplasm of breast: Secondary | ICD-10-CM

## 2017-01-18 ENCOUNTER — Ambulatory Visit
Admission: RE | Admit: 2017-01-18 | Discharge: 2017-01-18 | Disposition: A | Discharge status: Home / Self Care | Payer: Medicare Other | Source: Ambulatory Visit | Attending: Family Medicine | Admitting: Family Medicine

## 2017-01-18 DIAGNOSIS — Z1231 Encounter for screening mammogram for malignant neoplasm of breast: Secondary | ICD-10-CM | POA: Diagnosis not present

## 2017-01-18 IMAGING — MG 2D DIGITAL SCREENING BILATERAL MAMMOGRAM WITH CAD AND ADJUNCT TO
8 of 13 series · 8 of 29 positions shown · non-contrast
Comparison: Previous exam(s).

CLINICAL DATA: Screening. History of treated left breast cancer,
status post lumpectomy in [AP].

EXAM:
2D DIGITAL SCREENING BILATERAL MAMMOGRAM WITH CAD AND ADJUNCT TOMO

[R MLO (1 of 2)]
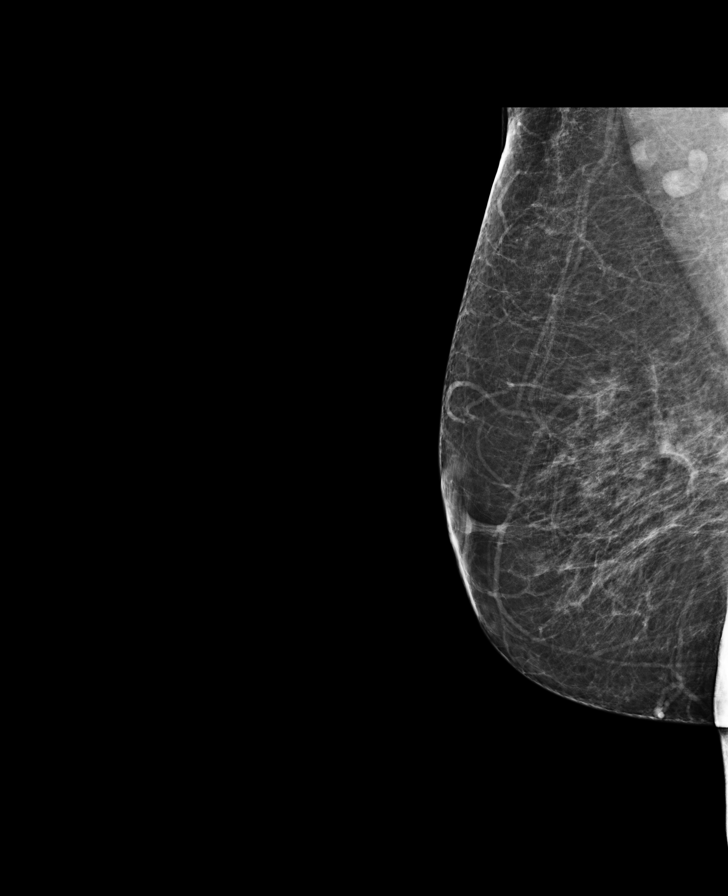

[L MLO synth-2D]
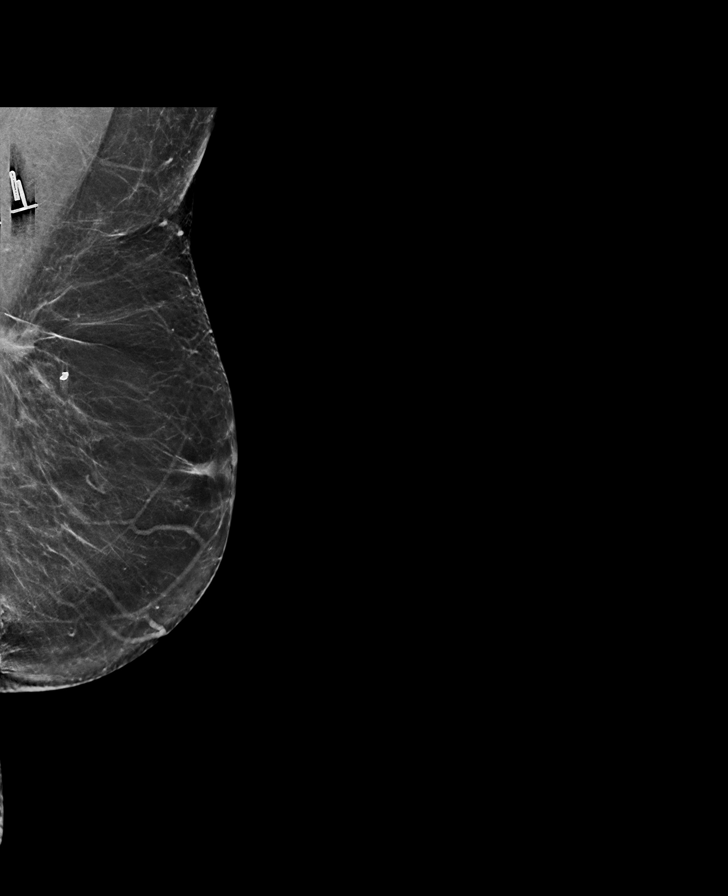

[L CC]
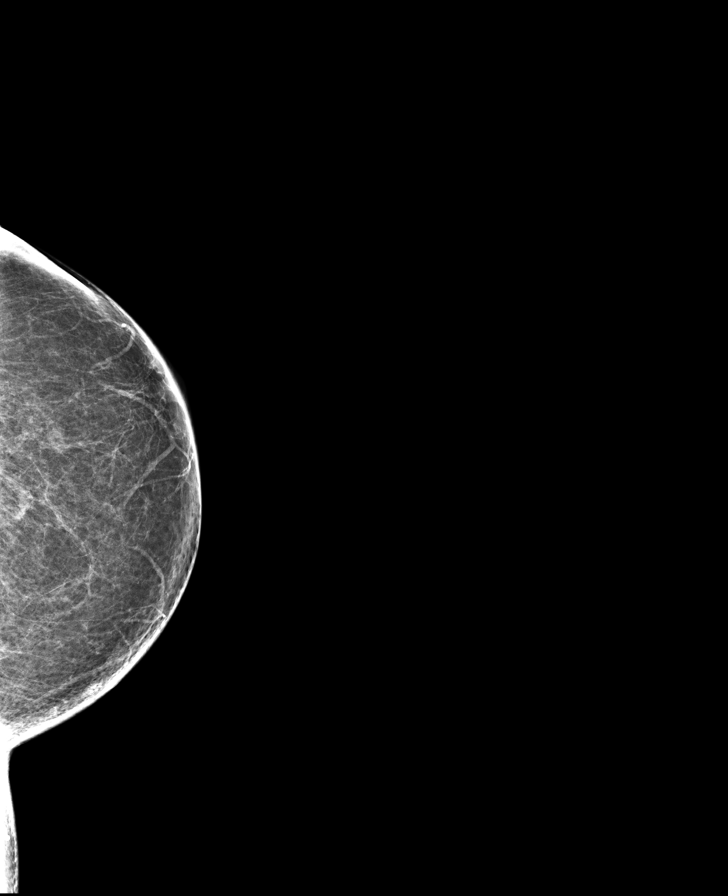

[R MLO (2 of 2)]
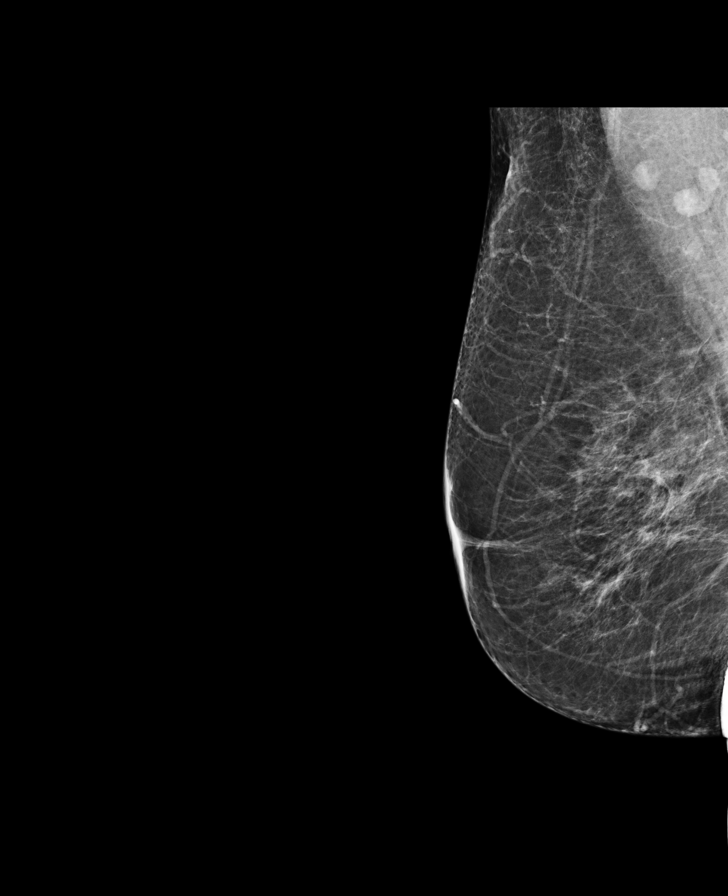

[R CC synth-2D]
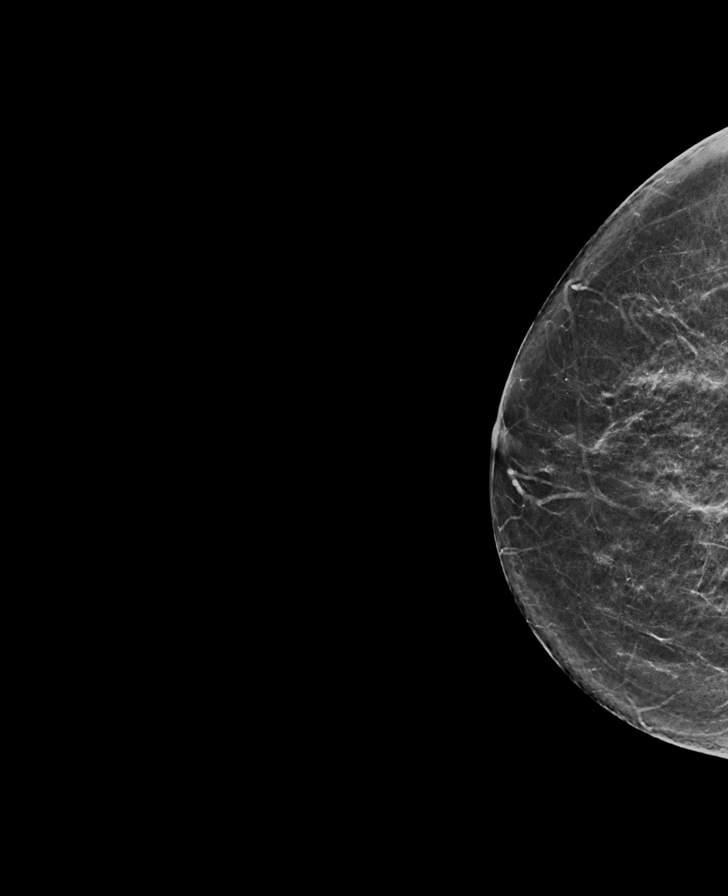

[R MLO synth-2D]
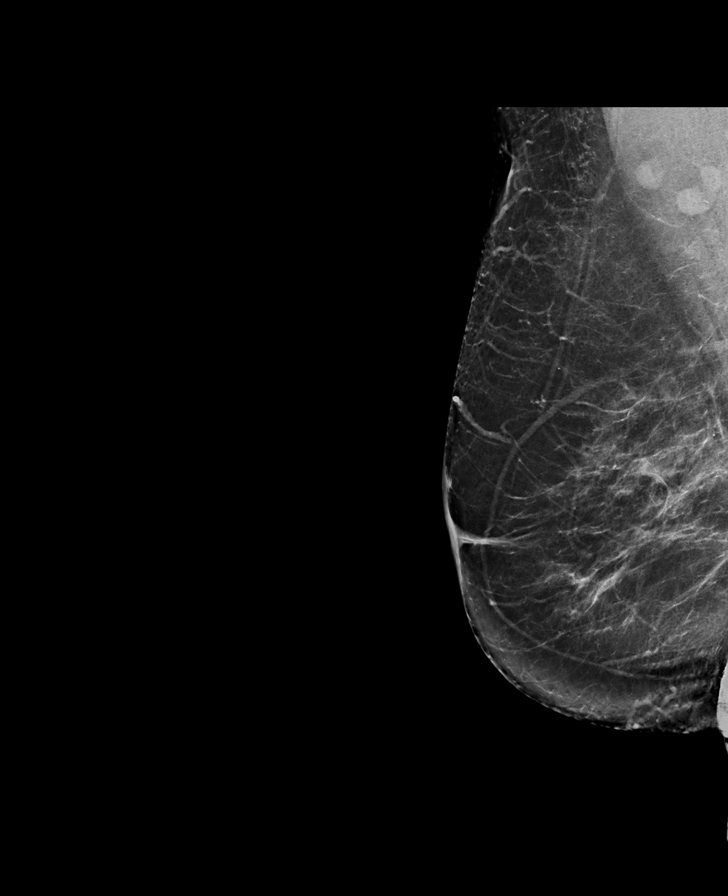

[R CC]
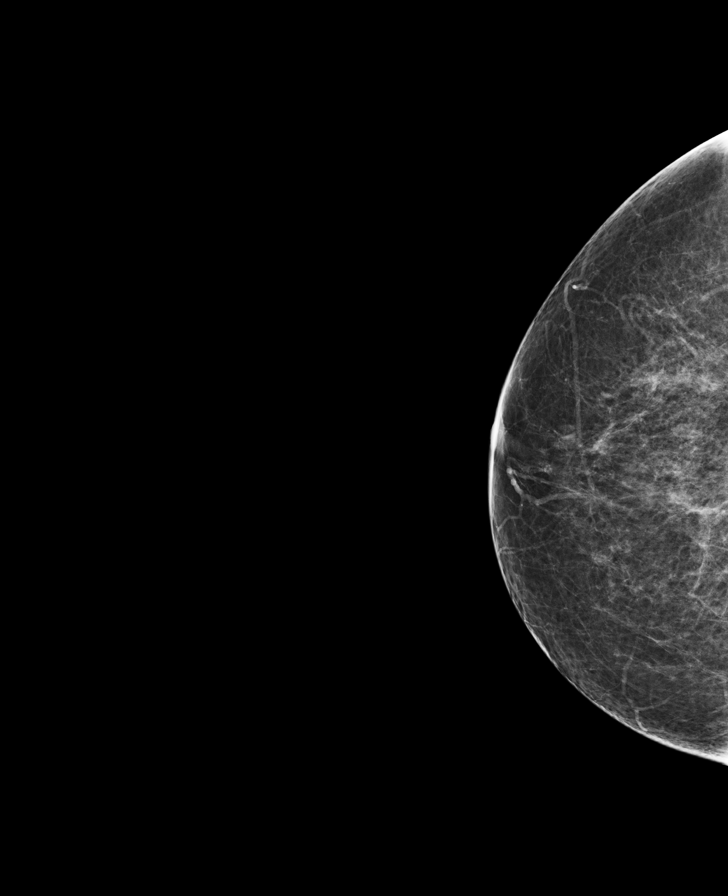

[L CC synth-2D]
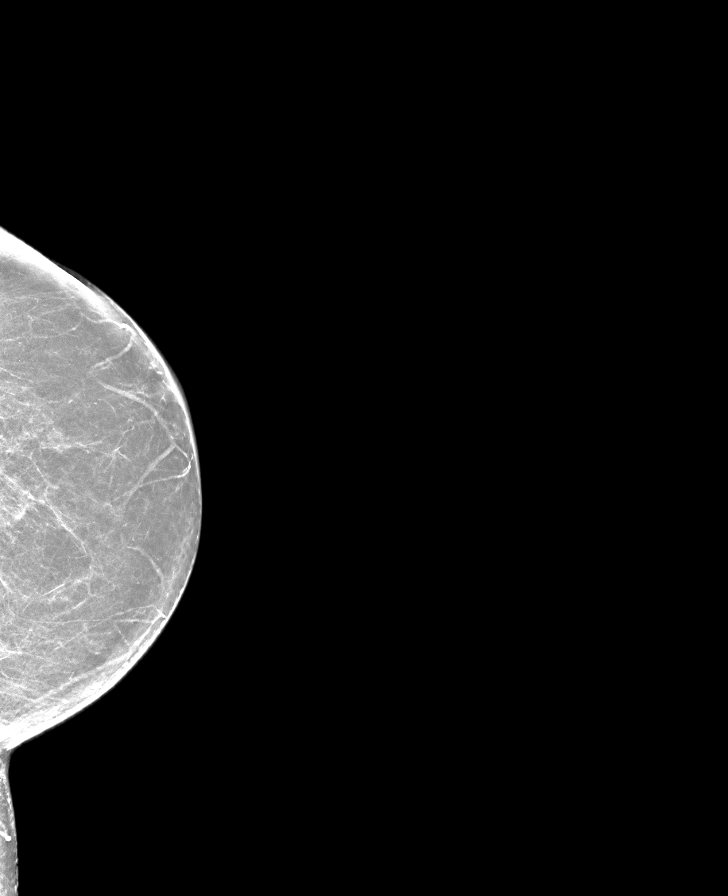

[8 of 29 positions shown; findings below may reference images not displayed]

ACR Breast Density Category b: There are scattered areas of
fibroglandular density.
FINDINGS: In the left breast, a possible asymmetry with associated
calcifications within the lumpectomy site warrants further
evaluation. In the right breast, no findings suspicious for
malignancy. Images were processed with CAD.
IMPRESSION: Further evaluation is suggested for possible
asymmetry/calcifications in the left breast.

RECOMMENDATION:
Diagnostic mammogram, to include exaggerated craniocaudal view and
possibly ultrasound of the left breast. (Code:[AP])

The patient will be contacted regarding the findings, and additional
imaging will be scheduled.

BI-RADS CATEGORY  0: Incomplete. Need additional imaging evaluation
and/or prior mammograms for comparison.

## 2017-01-21 NOTE — Telephone Encounter (Signed)
Patient's surgery was rescheduled to February 16.  I left her a message stating that it was rescheduled.  I asked that she call if she has any questions or concerns.

## 2017-01-22 ENCOUNTER — Other Ambulatory Visit: Payer: Self-pay | Admitting: Family Medicine

## 2017-01-22 DIAGNOSIS — R928 Other abnormal and inconclusive findings on diagnostic imaging of breast: Secondary | ICD-10-CM

## 2017-01-25 ENCOUNTER — Ambulatory Visit
Admission: RE | Admit: 2017-01-25 | Discharge: 2017-01-25 | Disposition: A | Discharge status: Home / Self Care | Payer: Medicare Other | Source: Ambulatory Visit | Attending: Family Medicine | Admitting: Family Medicine

## 2017-01-25 DIAGNOSIS — R928 Other abnormal and inconclusive findings on diagnostic imaging of breast: Secondary | ICD-10-CM

## 2017-01-25 DIAGNOSIS — R921 Mammographic calcification found on diagnostic imaging of breast: Secondary | ICD-10-CM | POA: Diagnosis not present

## 2017-01-30 ENCOUNTER — Telehealth: Payer: Self-pay | Admitting: *Deleted

## 2017-01-30 NOTE — Telephone Encounter (Signed)
"  My wife is scheduled to have surgery on February 16.  I'd like to know where will it be located and things like that."  I left patient a message that the brochure in her blue bag has the name of the facility and the address.  I also informed her that someone from the surgical center will call with the arrival time a day or two prior to surgery date.

## 2017-02-06 ENCOUNTER — Telehealth: Payer: Self-pay | Admitting: Podiatry

## 2017-02-06 ENCOUNTER — Telehealth: Payer: Self-pay | Admitting: *Deleted

## 2017-02-06 NOTE — Telephone Encounter (Signed)
I would suggest rescheduling until better.

## 2017-02-06 NOTE — Telephone Encounter (Signed)
"  I calling on behalf of my wife, Yvonne Jordan.  She's scheduled for surgery on Friday morning.  She needs to talk to someone about an issue she had."

## 2017-02-06 NOTE — Telephone Encounter (Signed)
Patients husband called and pt is scheduled for surgery Friday and is getting over a cold. Should she keep her surgery scheduled for this week or should she reschedule surgery when she is completely over the cold. Patients husband would like a call back on his cell please

## 2017-02-07 ENCOUNTER — Other Ambulatory Visit: Payer: Self-pay | Admitting: Podiatry

## 2017-02-07 MED ORDER — CEPHALEXIN 500 MG PO CAPS: 500.0000 mg | ORAL_CAPSULE | Freq: Three times a day (TID) | ORAL | 0 refills | Status: DC

## 2017-02-07 MED ORDER — ONDANSETRON HCL 4 MG PO TABS: 4.0000 mg | ORAL_TABLET | Freq: Three times a day (TID) | ORAL | 0 refills | Status: DC | PRN

## 2017-02-07 MED ORDER — HYDROMORPHONE HCL 4 MG PO TABS: 4.0000 mg | ORAL_TABLET | ORAL | 0 refills | Status: DC | PRN

## 2017-02-07 NOTE — Telephone Encounter (Signed)
"  I'm supposed to have surgery tomorrow morning.  I'm supposed to be there at 7 am.  I want to let you know I'm doing well today and I was doing well yesterday evening.  I think I am fine for surgery tomorrow.  I'll be at work.  I'll try to keep my phone on.  Thank you very much."

## 2017-02-07 NOTE — Telephone Encounter (Addendum)
I am calling to see if you have a cough per Dr. Milinda Pointer.  He said if you have a cough, it could cause problems with anesthesia  "No, no cough, I feel 100% better.  I am over it."  I will let him know.

## 2017-02-07 NOTE — Telephone Encounter (Signed)
See her tomorrow 

## 2017-02-07 NOTE — Telephone Encounter (Signed)
I'm returning your call.  How can I help you.  "I had been sick, dealing with a call.  I feel much better now.  I was a little dizzy yesterday but I realized I hadn't put my eye drops in."  Did you stop taking your Plavix?  "Yes, I stopped taking it on Sunday.  I assume I still get there at 7 am tomorrow?"  You are to go at whatever time the surgical center asked you to be there.  They make that decision.

## 2017-02-07 NOTE — Telephone Encounter (Signed)
If she has a cough then must reschedule because of the possibility of a reactive air way.. Otherwise I will see her tomorrow.  Thanks.

## 2017-02-08 ENCOUNTER — Encounter: Payer: Self-pay | Admitting: Podiatry

## 2017-02-08 DIAGNOSIS — Y939 Activity, unspecified: Secondary | ICD-10-CM | POA: Diagnosis not present

## 2017-02-08 DIAGNOSIS — M25775 Osteophyte, left foot: Secondary | ICD-10-CM | POA: Diagnosis not present

## 2017-02-08 DIAGNOSIS — Y999 Unspecified external cause status: Secondary | ICD-10-CM | POA: Diagnosis not present

## 2017-02-08 DIAGNOSIS — M2042 Other hammer toe(s) (acquired), left foot: Secondary | ICD-10-CM | POA: Diagnosis not present

## 2017-02-08 DIAGNOSIS — Y929 Unspecified place or not applicable: Secondary | ICD-10-CM | POA: Diagnosis not present

## 2017-02-08 DIAGNOSIS — K219 Gastro-esophageal reflux disease without esophagitis: Secondary | ICD-10-CM | POA: Diagnosis not present

## 2017-02-08 DIAGNOSIS — S92412A Displaced fracture of proximal phalanx of left great toe, initial encounter for closed fracture: Secondary | ICD-10-CM | POA: Diagnosis not present

## 2017-02-08 DIAGNOSIS — X58XXXA Exposure to other specified factors, initial encounter: Secondary | ICD-10-CM | POA: Diagnosis not present

## 2017-02-08 DIAGNOSIS — M2012 Hallux valgus (acquired), left foot: Secondary | ICD-10-CM | POA: Diagnosis not present

## 2017-02-08 DIAGNOSIS — M24575 Contracture, left foot: Secondary | ICD-10-CM | POA: Diagnosis not present

## 2017-02-08 DIAGNOSIS — M25572 Pain in left ankle and joints of left foot: Secondary | ICD-10-CM | POA: Diagnosis not present

## 2017-02-13 ENCOUNTER — Telehealth: Payer: Self-pay

## 2017-02-13 NOTE — Telephone Encounter (Signed)
Spoke with pt regarding post op status. She states that she is doing well, only taking tylenol as needed for pain. Denies s/s of infection. She is to call with any questions or concerns

## 2017-02-14 ENCOUNTER — Other Ambulatory Visit: Payer: Medicare Other

## 2017-02-15 NOTE — Progress Notes (Signed)
DOS 02.16.2018 Dorsal tarsal exostectomy left. Release 3rd MTPj left. Mallet toe repair 3rd dipj left.

## 2017-02-18 ENCOUNTER — Telehealth: Payer: Self-pay | Admitting: *Deleted

## 2017-02-18 NOTE — Telephone Encounter (Signed)
Pt states had surgery 02/08/2017 and only had 2 days of pain, but now has pretty bad pain that has been going on for 3 days. I spoke with pt and she states she drove to [place] to get her taxes and went to church, may have been up on the foot more that 15-30 minutes per hour, pain is sharp and shooting not related to activity per pt. I told pt I saw that she had an appt tomorrow with Dr. Milinda Pointer, to rest, ice and elevate and stay in the boot. Pt states understanding, and has used ex-strength tylenol for the pain.

## 2017-02-19 ENCOUNTER — Ambulatory Visit (INDEPENDENT_AMBULATORY_CARE_PROVIDER_SITE_OTHER): Payer: Self-pay | Admitting: Sports Medicine

## 2017-02-19 ENCOUNTER — Ambulatory Visit (INDEPENDENT_AMBULATORY_CARE_PROVIDER_SITE_OTHER): Payer: Medicare Other

## 2017-02-19 VITALS — BP 113/66 | HR 102 | Resp 16

## 2017-02-19 DIAGNOSIS — M2012 Hallux valgus (acquired), left foot: Secondary | ICD-10-CM | POA: Diagnosis not present

## 2017-02-19 DIAGNOSIS — M779 Enthesopathy, unspecified: Secondary | ICD-10-CM | POA: Diagnosis not present

## 2017-02-19 DIAGNOSIS — M898X9 Other specified disorders of bone, unspecified site: Secondary | ICD-10-CM

## 2017-02-19 DIAGNOSIS — M205X2 Other deformities of toe(s) (acquired), left foot: Secondary | ICD-10-CM

## 2017-02-19 DIAGNOSIS — Z9889 Other specified postprocedural states: Secondary | ICD-10-CM

## 2017-02-19 NOTE — Progress Notes (Signed)
Subjective: Yvonne Jordan is a 71 y.o. female patient seen today in office for POV #1 (DOS 02-08-17), S/P Left Dorsal tarsal exostectomy left. Release 3rd MTPj left. Mallet toe repair 3rd dipj left with Dr. Milinda Pointer. Patient denies pain at surgical site, denies calf pain, denies headache, chest pain, shortness of breath, nausea, vomiting, fever, or chills. Patient states that she is doing well and is only taking Tylenol if needed. Reports that she has finished all of her antibiotics. No other issues noted.   Patient Active Problem List   Diagnosis Date Noted  . Low back pain 04/19/2016  . Abnormality of gait 03/29/2016  . Fall 03/29/2016  . Dyspnea 11/10/2014  . GERD (gastroesophageal reflux disease) 11/10/2014  . TIA (transient ischemic attack)   . Vitamin D deficiency   . Allergic rhinitis   . CKD (chronic kidney disease)   . Colon polyp   . Hyperlipidemia     Current Outpatient Prescriptions on File Prior to Visit  Medication Sig Dispense Refill  . Ascorbic Acid (VITAMIN C) 1000 MG tablet Take 1,000 mg by mouth daily.    . cephALEXin (KEFLEX) 500 MG capsule Take 1 capsule (500 mg total) by mouth 3 (three) times daily. 30 capsule 0  . Cholecalciferol (VITAMIN D3) 2000 UNITS TABS Take by mouth.    . clopidogrel (PLAVIX) 75 MG tablet Take 75 mg by mouth daily with breakfast.    . Cyanocobalamin (VITAMIN B12 PO) Take 5,000 mcg by mouth.    Marland Kitchen HYDROmorphone (DILAUDID) 4 MG tablet Take 1 tablet (4 mg total) by mouth every 4 (four) hours as needed for severe pain. 30 tablet 0  . Multiple Vitamin (MULTIVITAMIN) capsule Take 1 capsule by mouth daily.    . ondansetron (ZOFRAN) 4 MG tablet Take 1 tablet (4 mg total) by mouth every 8 (eight) hours as needed. 20 tablet 0  . pantoprazole (PROTONIX) 40 MG tablet Take 1 tablet (40 mg total) by mouth 2 (two) times daily before a meal. For 30 days 180 tablet 3   No current facility-administered medications on file prior to visit.     No Known  Allergies  Objective: There were no vitals filed for this visit.  General: No acute distress, AAOx3  Left foot: Sutures intact with no gapping or dehiscence at surgical sites, mild swelling to left foot, no erythema, no warmth, no drainage, no acute signs of infection noted, Capillary fill time <3 seconds in all digits, gross sensation present via light touch to left foot. No pain or crepitation with range of motion left foot.  No pain with calf compression.   Post Op Xray,Left foot: No acute findings. Evidence of surgical repair. Soft tissue swelling within normal limits for post op status.   Assessment and Plan:  Problem List Items Addressed This Visit    None    Visit Diagnoses    Hav (hallux abducto valgus), left    -  Primary   Relevant Orders   DG Foot Complete Left (Completed)   Mallet toe of left foot       Relevant Orders   DG Foot Complete Left (Completed)   Exostosis, dorsal, left       POV   S/P foot surgery, left           -Patient seen and evaluated -Xrays reviewed -Applied dry sterile dressing to surgical site left foot secured with ACE wrap and stockinet  -Advised patient to make sure to keep dressings clean, dry, and intact to  left surgical site, removing the ACE as needed  -Advised patient to continue with CAM boot on left  -Advised patient to limit activity to necessity. Advised patient that if she returns to work before next office visit to take break each hour to rest and to elevated to prevent excessive swelling or wound healing issues  -Advised patient to ice necessary  -Continue with PRN meds -Will plan for suture removal at next office visit with Dr. Milinda Pointer. In the meantime, patient to call office if any issues or problems arise.   Landis Martins, DPM

## 2017-02-25 ENCOUNTER — Ambulatory Visit: Payer: Medicare Other

## 2017-02-25 ENCOUNTER — Ambulatory Visit (INDEPENDENT_AMBULATORY_CARE_PROVIDER_SITE_OTHER): Payer: Self-pay | Admitting: Podiatry

## 2017-02-25 DIAGNOSIS — M2012 Hallux valgus (acquired), left foot: Secondary | ICD-10-CM

## 2017-02-25 DIAGNOSIS — M205X2 Other deformities of toe(s) (acquired), left foot: Secondary | ICD-10-CM

## 2017-02-25 DIAGNOSIS — M898X9 Other specified disorders of bone, unspecified site: Secondary | ICD-10-CM

## 2017-02-25 NOTE — Progress Notes (Signed)
She presents today 3 weeks status post capsulotomy and tenotomy third digits of the left foot. Tarsal exostectomy to dorsal aspect of the left foot as well as a mallet toe repair third digit left foot. She states that she went back to work last week in the boot and caused it to her so now she is in her Darco shoe and she states it feels much better.  Objective: Dry sterile dressing intact was removed demonstrates no erythema edema cellulitis drainage or odor sutures are intact margins. Be well coapted once this sutures were removed there is some mild dehiscence but not bad. I see no bleeding no signs of infection.  Assessment: Well-healing surgical foot left.  Plan: Removed all of her sutures today for a compression dressing demonstrated for her how to dress the toes. We'll follow up with her in 2 weeks. She will continue use of the Darco shoe at that time.

## 2017-03-01 DIAGNOSIS — H25013 Cortical age-related cataract, bilateral: Secondary | ICD-10-CM | POA: Diagnosis not present

## 2017-03-01 DIAGNOSIS — H43812 Vitreous degeneration, left eye: Secondary | ICD-10-CM | POA: Diagnosis not present

## 2017-03-01 DIAGNOSIS — H2513 Age-related nuclear cataract, bilateral: Secondary | ICD-10-CM | POA: Diagnosis not present

## 2017-03-01 DIAGNOSIS — H04123 Dry eye syndrome of bilateral lacrimal glands: Secondary | ICD-10-CM | POA: Diagnosis not present

## 2017-03-11 ENCOUNTER — Encounter: Payer: Self-pay | Admitting: Podiatry

## 2017-03-11 ENCOUNTER — Ambulatory Visit (INDEPENDENT_AMBULATORY_CARE_PROVIDER_SITE_OTHER): Payer: Self-pay | Admitting: Podiatry

## 2017-03-11 ENCOUNTER — Ambulatory Visit (INDEPENDENT_AMBULATORY_CARE_PROVIDER_SITE_OTHER): Payer: Medicare Other

## 2017-03-11 DIAGNOSIS — M205X2 Other deformities of toe(s) (acquired), left foot: Secondary | ICD-10-CM

## 2017-03-11 DIAGNOSIS — M898X9 Other specified disorders of bone, unspecified site: Secondary | ICD-10-CM

## 2017-03-11 NOTE — Progress Notes (Signed)
She presents today one month status post dorsal tarsal exostectomy mallet toe repair third digit left foot. She also had a release metatarsophalangeal joint. She states he was doing very good entrapped patella tracked [facility] boot on and then the top of the foot started hurting.  Objective: Vital signs are stable she is alert and oriented 3. Pulses are palpable. She is myotendinous on palpation of the dorsal scar overlying the dorsal tarsal exostectomy but appears to be healing uneventfully. No signs of infection to some mild erythema.  Assessment: Well-healing surgical foot one month status post surgery.  Plan: Encouraged her to wear regular shoe gear and will follow up with me in 1 month

## 2017-03-14 ENCOUNTER — Ambulatory Visit
Admission: RE | Admit: 2017-03-14 | Discharge: 2017-03-14 | Disposition: A | Discharge status: Home / Self Care | Payer: Medicare Other | Source: Ambulatory Visit | Attending: Family Medicine | Admitting: Family Medicine

## 2017-03-14 ENCOUNTER — Other Ambulatory Visit: Payer: Self-pay | Admitting: Family Medicine

## 2017-03-14 DIAGNOSIS — Z6831 Body mass index (BMI) 31.0-31.9, adult: Secondary | ICD-10-CM | POA: Diagnosis not present

## 2017-03-14 DIAGNOSIS — M25551 Pain in right hip: Secondary | ICD-10-CM | POA: Diagnosis not present

## 2017-03-14 DIAGNOSIS — J309 Allergic rhinitis, unspecified: Secondary | ICD-10-CM | POA: Diagnosis not present

## 2017-03-14 DIAGNOSIS — M1611 Unilateral primary osteoarthritis, right hip: Secondary | ICD-10-CM | POA: Diagnosis not present

## 2017-03-14 DIAGNOSIS — Z853 Personal history of malignant neoplasm of breast: Secondary | ICD-10-CM | POA: Diagnosis not present

## 2017-03-14 DIAGNOSIS — N183 Chronic kidney disease, stage 3 (moderate): Secondary | ICD-10-CM | POA: Diagnosis not present

## 2017-03-14 DIAGNOSIS — E7800 Pure hypercholesterolemia, unspecified: Secondary | ICD-10-CM | POA: Diagnosis not present

## 2017-03-14 DIAGNOSIS — E559 Vitamin D deficiency, unspecified: Secondary | ICD-10-CM | POA: Diagnosis not present

## 2017-03-14 DIAGNOSIS — K219 Gastro-esophageal reflux disease without esophagitis: Secondary | ICD-10-CM | POA: Diagnosis not present

## 2017-03-14 DIAGNOSIS — Z8673 Personal history of transient ischemic attack (TIA), and cerebral infarction without residual deficits: Secondary | ICD-10-CM | POA: Diagnosis not present

## 2017-03-14 IMAGING — CR DG HIP (WITH OR WITHOUT PELVIS) 2-3V*R*
2 series · 2 of 2 positions shown · non-contrast
Comparison: None.

CLINICAL DATA: Chronic right hip pain for 1 year

EXAM:
DG HIP (WITH OR WITHOUT PELVIS) 2-3V RIGHT

[w pelvis]
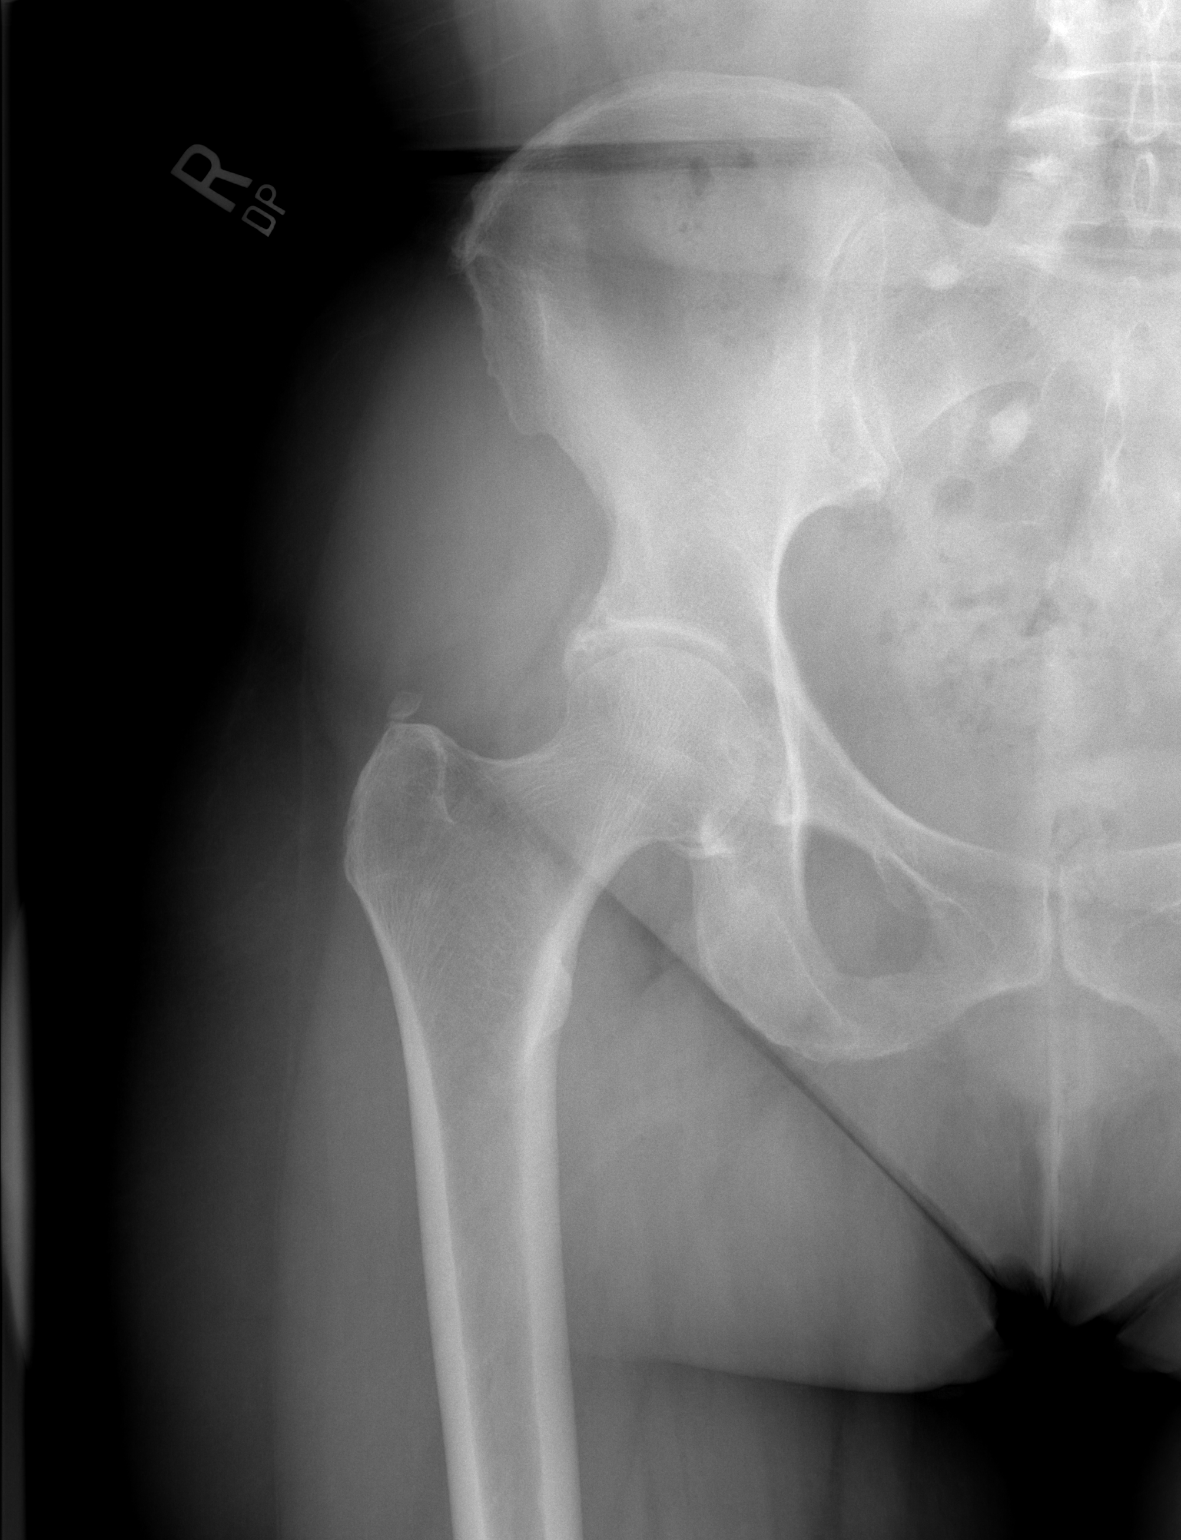

[t hip frog leg right]
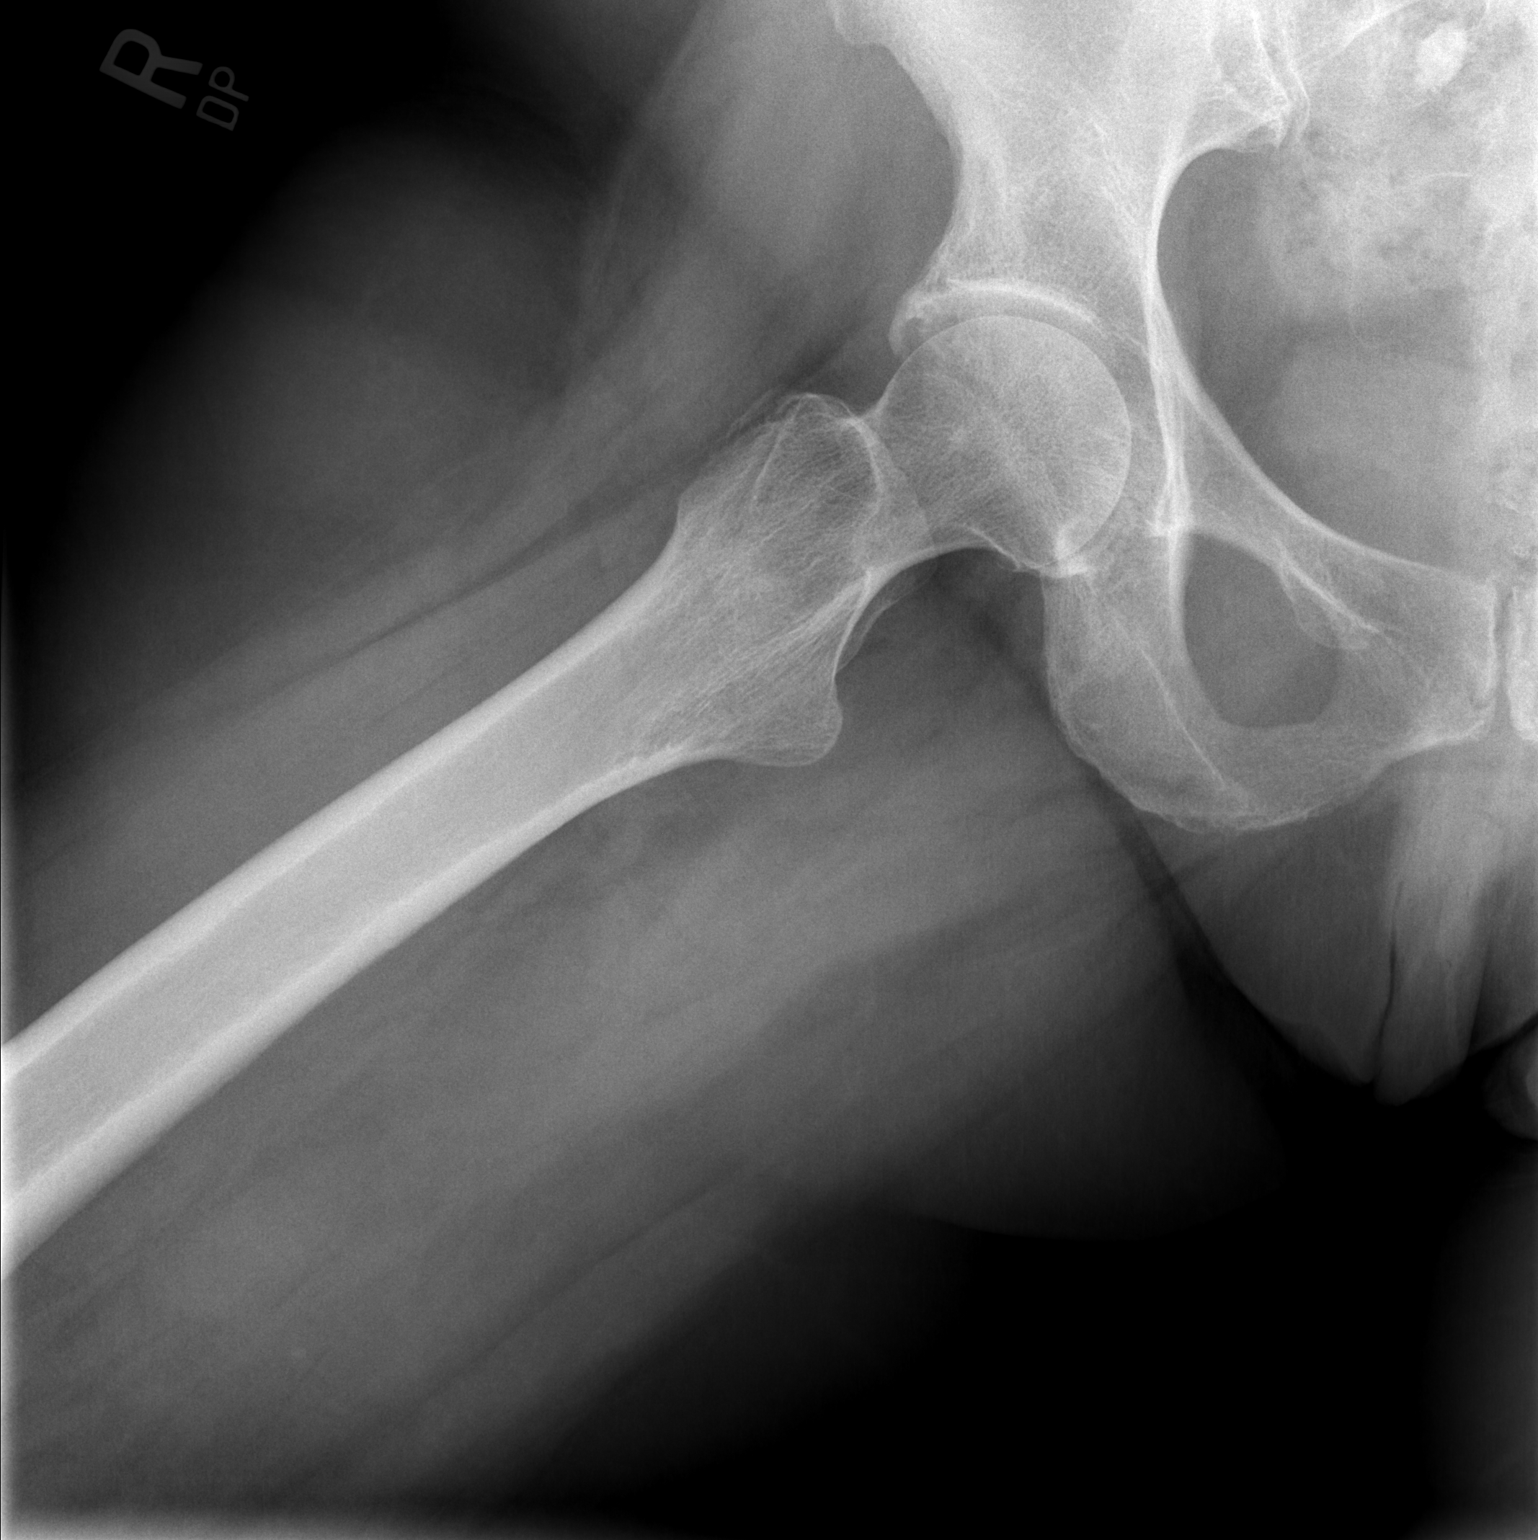

[2 of 2 positions shown; findings below may reference images not displayed]

FINDINGS: Mild degenerative spurring in the right hip joint. Right AC joint is
unremarkable. No acute bony abnormality. Specifically, no fracture,
subluxation, or dislocation. Sclerotic areas project over the right
sacrum. These may reflect bone islands.
IMPRESSION: Mild degenerative changes in the right hip.

Sclerotic areas over the right sacrum, possibly bone islands.
Recommend clinical correlation for any cancer history. If there is
clinical concern these could represent sclerotic metastases, bone
scan may be helpful.

## 2017-04-03 DIAGNOSIS — M7061 Trochanteric bursitis, right hip: Secondary | ICD-10-CM | POA: Diagnosis not present

## 2017-04-03 DIAGNOSIS — M25551 Pain in right hip: Secondary | ICD-10-CM | POA: Diagnosis not present

## 2017-04-10 ENCOUNTER — Encounter: Payer: Medicare Other | Admitting: Podiatry

## 2017-06-17 DIAGNOSIS — S9001XA Contusion of right ankle, initial encounter: Secondary | ICD-10-CM | POA: Diagnosis not present

## 2017-06-17 DIAGNOSIS — S8001XA Contusion of right knee, initial encounter: Secondary | ICD-10-CM | POA: Diagnosis not present

## 2017-06-17 DIAGNOSIS — Y92513 Shop (commercial) as the place of occurrence of the external cause: Secondary | ICD-10-CM | POA: Diagnosis not present

## 2017-06-17 DIAGNOSIS — W19XXXA Unspecified fall, initial encounter: Secondary | ICD-10-CM | POA: Diagnosis not present

## 2017-08-08 DIAGNOSIS — N644 Mastodynia: Secondary | ICD-10-CM | POA: Diagnosis not present

## 2017-08-20 ENCOUNTER — Other Ambulatory Visit: Payer: Self-pay | Admitting: Physician Assistant

## 2017-08-20 DIAGNOSIS — N644 Mastodynia: Secondary | ICD-10-CM

## 2017-08-23 ENCOUNTER — Other Ambulatory Visit: Payer: Self-pay | Admitting: Physician Assistant

## 2017-08-23 DIAGNOSIS — N644 Mastodynia: Secondary | ICD-10-CM

## 2017-09-09 ENCOUNTER — Ambulatory Visit
Admission: RE | Admit: 2017-09-09 | Discharge: 2017-09-09 | Disposition: A | Discharge status: Home / Self Care | Payer: Medicare Other | Source: Ambulatory Visit | Attending: Physician Assistant | Admitting: Physician Assistant

## 2017-09-09 ENCOUNTER — Ambulatory Visit: Payer: Medicare Other

## 2017-09-09 DIAGNOSIS — N644 Mastodynia: Secondary | ICD-10-CM

## 2017-09-09 DIAGNOSIS — R928 Other abnormal and inconclusive findings on diagnostic imaging of breast: Secondary | ICD-10-CM | POA: Diagnosis not present

## 2017-09-09 HISTORY — DX: Personal history of irradiation: Z92.3

## 2017-10-04 DIAGNOSIS — H04123 Dry eye syndrome of bilateral lacrimal glands: Secondary | ICD-10-CM | POA: Diagnosis not present

## 2017-10-04 DIAGNOSIS — H01009 Unspecified blepharitis unspecified eye, unspecified eyelid: Secondary | ICD-10-CM | POA: Diagnosis not present

## 2017-10-04 DIAGNOSIS — H16141 Punctate keratitis, right eye: Secondary | ICD-10-CM | POA: Diagnosis not present

## 2017-10-04 DIAGNOSIS — H01003 Unspecified blepharitis right eye, unspecified eyelid: Secondary | ICD-10-CM | POA: Diagnosis not present

## 2017-10-15 DIAGNOSIS — N183 Chronic kidney disease, stage 3 (moderate): Secondary | ICD-10-CM | POA: Diagnosis not present

## 2017-10-15 DIAGNOSIS — Z23 Encounter for immunization: Secondary | ICD-10-CM | POA: Diagnosis not present

## 2017-10-15 DIAGNOSIS — M85851 Other specified disorders of bone density and structure, right thigh: Secondary | ICD-10-CM | POA: Diagnosis not present

## 2017-10-15 DIAGNOSIS — Z853 Personal history of malignant neoplasm of breast: Secondary | ICD-10-CM | POA: Diagnosis not present

## 2017-10-15 DIAGNOSIS — J309 Allergic rhinitis, unspecified: Secondary | ICD-10-CM | POA: Diagnosis not present

## 2017-10-15 DIAGNOSIS — Z8673 Personal history of transient ischemic attack (TIA), and cerebral infarction without residual deficits: Secondary | ICD-10-CM | POA: Diagnosis not present

## 2017-10-15 DIAGNOSIS — K219 Gastro-esophageal reflux disease without esophagitis: Secondary | ICD-10-CM | POA: Diagnosis not present

## 2017-10-15 DIAGNOSIS — M25551 Pain in right hip: Secondary | ICD-10-CM | POA: Diagnosis not present

## 2017-10-15 DIAGNOSIS — E559 Vitamin D deficiency, unspecified: Secondary | ICD-10-CM | POA: Diagnosis not present

## 2017-10-15 DIAGNOSIS — Z1389 Encounter for screening for other disorder: Secondary | ICD-10-CM | POA: Diagnosis not present

## 2017-10-15 DIAGNOSIS — E7800 Pure hypercholesterolemia, unspecified: Secondary | ICD-10-CM | POA: Diagnosis not present

## 2017-10-15 DIAGNOSIS — Z0000 Encounter for general adult medical examination without abnormal findings: Secondary | ICD-10-CM | POA: Diagnosis not present

## 2017-10-15 DIAGNOSIS — Z1159 Encounter for screening for other viral diseases: Secondary | ICD-10-CM | POA: Diagnosis not present

## 2017-12-30 DIAGNOSIS — M8588 Other specified disorders of bone density and structure, other site: Secondary | ICD-10-CM | POA: Diagnosis not present

## 2018-03-19 DIAGNOSIS — H25013 Cortical age-related cataract, bilateral: Secondary | ICD-10-CM | POA: Diagnosis not present

## 2018-03-19 DIAGNOSIS — H04123 Dry eye syndrome of bilateral lacrimal glands: Secondary | ICD-10-CM | POA: Diagnosis not present

## 2018-03-19 DIAGNOSIS — H43812 Vitreous degeneration, left eye: Secondary | ICD-10-CM | POA: Diagnosis not present

## 2018-03-19 DIAGNOSIS — H2513 Age-related nuclear cataract, bilateral: Secondary | ICD-10-CM | POA: Diagnosis not present

## 2018-04-29 DIAGNOSIS — E559 Vitamin D deficiency, unspecified: Secondary | ICD-10-CM | POA: Diagnosis not present

## 2018-04-29 DIAGNOSIS — N183 Chronic kidney disease, stage 3 (moderate): Secondary | ICD-10-CM | POA: Diagnosis not present

## 2018-04-29 DIAGNOSIS — J309 Allergic rhinitis, unspecified: Secondary | ICD-10-CM | POA: Diagnosis not present

## 2018-04-29 DIAGNOSIS — Z8673 Personal history of transient ischemic attack (TIA), and cerebral infarction without residual deficits: Secondary | ICD-10-CM | POA: Diagnosis not present

## 2018-04-29 DIAGNOSIS — M85851 Other specified disorders of bone density and structure, right thigh: Secondary | ICD-10-CM | POA: Diagnosis not present

## 2018-04-29 DIAGNOSIS — E7800 Pure hypercholesterolemia, unspecified: Secondary | ICD-10-CM | POA: Diagnosis not present

## 2018-04-29 DIAGNOSIS — K219 Gastro-esophageal reflux disease without esophagitis: Secondary | ICD-10-CM | POA: Diagnosis not present

## 2018-05-28 DIAGNOSIS — H10811 Pingueculitis, right eye: Secondary | ICD-10-CM | POA: Diagnosis not present

## 2018-07-15 ENCOUNTER — Ambulatory Visit (INDEPENDENT_AMBULATORY_CARE_PROVIDER_SITE_OTHER): Payer: Medicare Other | Admitting: Podiatry

## 2018-07-15 ENCOUNTER — Ambulatory Visit (INDEPENDENT_AMBULATORY_CARE_PROVIDER_SITE_OTHER): Payer: Medicare Other

## 2018-07-15 ENCOUNTER — Encounter: Payer: Self-pay | Admitting: Podiatry

## 2018-07-15 DIAGNOSIS — M779 Enthesopathy, unspecified: Secondary | ICD-10-CM | POA: Diagnosis not present

## 2018-07-15 DIAGNOSIS — M778 Other enthesopathies, not elsewhere classified: Secondary | ICD-10-CM

## 2018-07-15 DIAGNOSIS — D689 Coagulation defect, unspecified: Secondary | ICD-10-CM | POA: Diagnosis not present

## 2018-07-15 DIAGNOSIS — Q828 Other specified congenital malformations of skin: Secondary | ICD-10-CM | POA: Diagnosis not present

## 2018-07-15 NOTE — Progress Notes (Signed)
She presents today chief complaint of painful calluses overlying the fifth toes bilaterally as well as a painful lesion sub-fourth metatarsal head of the left foot.  Objective: Vital signs are stable alert and oriented x3 pulses remain palpable.  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  She also has diffuse tylomas and corns over the fifth digital PIPJ's bilateral.  Assessment: Pain in limb secondary to poor keratoma and corns.  Plan: Debridement of reactive hyperkeratosis fifth digits bilateral sharp debridement of the lesion and then placed salicylic acid under occlusion to be left on for 3 days without getting wet.  She was then remove this washed thoroughly and follow-up with me in 6 weeks

## 2018-08-28 ENCOUNTER — Ambulatory Visit (INDEPENDENT_AMBULATORY_CARE_PROVIDER_SITE_OTHER): Payer: Medicare Other | Admitting: Podiatry

## 2018-08-28 ENCOUNTER — Encounter: Payer: Self-pay | Admitting: Podiatry

## 2018-08-28 DIAGNOSIS — D689 Coagulation defect, unspecified: Secondary | ICD-10-CM | POA: Diagnosis not present

## 2018-08-28 DIAGNOSIS — Q828 Other specified congenital malformations of skin: Secondary | ICD-10-CM | POA: Diagnosis not present

## 2018-08-30 NOTE — Progress Notes (Signed)
She presents today chief complaint of painful calluses plantar aspect of the foot.  Objective: Vital signs are stable alert and oriented x3.  There is no erythema edema cellulitis drainage or odor.  Reactive hyper keratomas plantar aspect of the bilateral foot no open lesions or wounds at this point.  Assessment: Well-healing reactive hyper keratomas.  Plan: Follow-up with me in 1 month

## 2018-09-01 DIAGNOSIS — Z23 Encounter for immunization: Secondary | ICD-10-CM | POA: Diagnosis not present

## 2018-10-21 ENCOUNTER — Ambulatory Visit: Payer: Medicare Other | Admitting: Podiatry

## 2018-12-04 DIAGNOSIS — Z1211 Encounter for screening for malignant neoplasm of colon: Secondary | ICD-10-CM | POA: Diagnosis not present

## 2018-12-04 DIAGNOSIS — G459 Transient cerebral ischemic attack, unspecified: Secondary | ICD-10-CM | POA: Diagnosis not present

## 2018-12-04 DIAGNOSIS — G8929 Other chronic pain: Secondary | ICD-10-CM | POA: Diagnosis not present

## 2018-12-04 DIAGNOSIS — M25561 Pain in right knee: Secondary | ICD-10-CM | POA: Diagnosis not present

## 2018-12-04 DIAGNOSIS — L989 Disorder of the skin and subcutaneous tissue, unspecified: Secondary | ICD-10-CM | POA: Diagnosis not present

## 2018-12-04 DIAGNOSIS — Z1239 Encounter for other screening for malignant neoplasm of breast: Secondary | ICD-10-CM | POA: Diagnosis not present

## 2018-12-04 DIAGNOSIS — R0602 Shortness of breath: Secondary | ICD-10-CM | POA: Diagnosis not present

## 2018-12-04 DIAGNOSIS — K219 Gastro-esophageal reflux disease without esophagitis: Secondary | ICD-10-CM | POA: Diagnosis not present

## 2018-12-04 DIAGNOSIS — Z0000 Encounter for general adult medical examination without abnormal findings: Secondary | ICD-10-CM | POA: Diagnosis not present

## 2018-12-04 DIAGNOSIS — Z853 Personal history of malignant neoplasm of breast: Secondary | ICD-10-CM | POA: Diagnosis not present

## 2018-12-04 DIAGNOSIS — Z136 Encounter for screening for cardiovascular disorders: Secondary | ICD-10-CM | POA: Diagnosis not present

## 2018-12-09 ENCOUNTER — Other Ambulatory Visit: Payer: Self-pay | Admitting: Family Medicine

## 2018-12-09 DIAGNOSIS — Z1231 Encounter for screening mammogram for malignant neoplasm of breast: Secondary | ICD-10-CM

## 2018-12-30 DIAGNOSIS — M25561 Pain in right knee: Secondary | ICD-10-CM | POA: Insufficient documentation

## 2018-12-30 DIAGNOSIS — M7061 Trochanteric bursitis, right hip: Secondary | ICD-10-CM | POA: Insufficient documentation

## 2019-01-02 DIAGNOSIS — M7061 Trochanteric bursitis, right hip: Secondary | ICD-10-CM | POA: Diagnosis not present

## 2019-01-02 DIAGNOSIS — M25551 Pain in right hip: Secondary | ICD-10-CM | POA: Diagnosis not present

## 2019-01-02 DIAGNOSIS — M25561 Pain in right knee: Secondary | ICD-10-CM | POA: Diagnosis not present

## 2019-01-06 ENCOUNTER — Ambulatory Visit
Admission: RE | Admit: 2019-01-06 | Discharge: 2019-01-06 | Disposition: A | Discharge status: Home / Self Care | Payer: Medicare Other | Source: Ambulatory Visit | Attending: Family Medicine | Admitting: Family Medicine

## 2019-01-06 DIAGNOSIS — Z1231 Encounter for screening mammogram for malignant neoplasm of breast: Secondary | ICD-10-CM | POA: Diagnosis not present

## 2019-01-06 IMAGING — MG DIGITAL SCREENING BILATERAL MAMMOGRAM WITH TOMO AND CAD
4 series · 4 of 8 positions shown · non-contrast
Comparison: Previous exam(s).

CLINICAL DATA: Screening.

EXAM:
DIGITAL SCREENING BILATERAL MAMMOGRAM WITH TOMO AND CAD

[R MLO synth-2D]
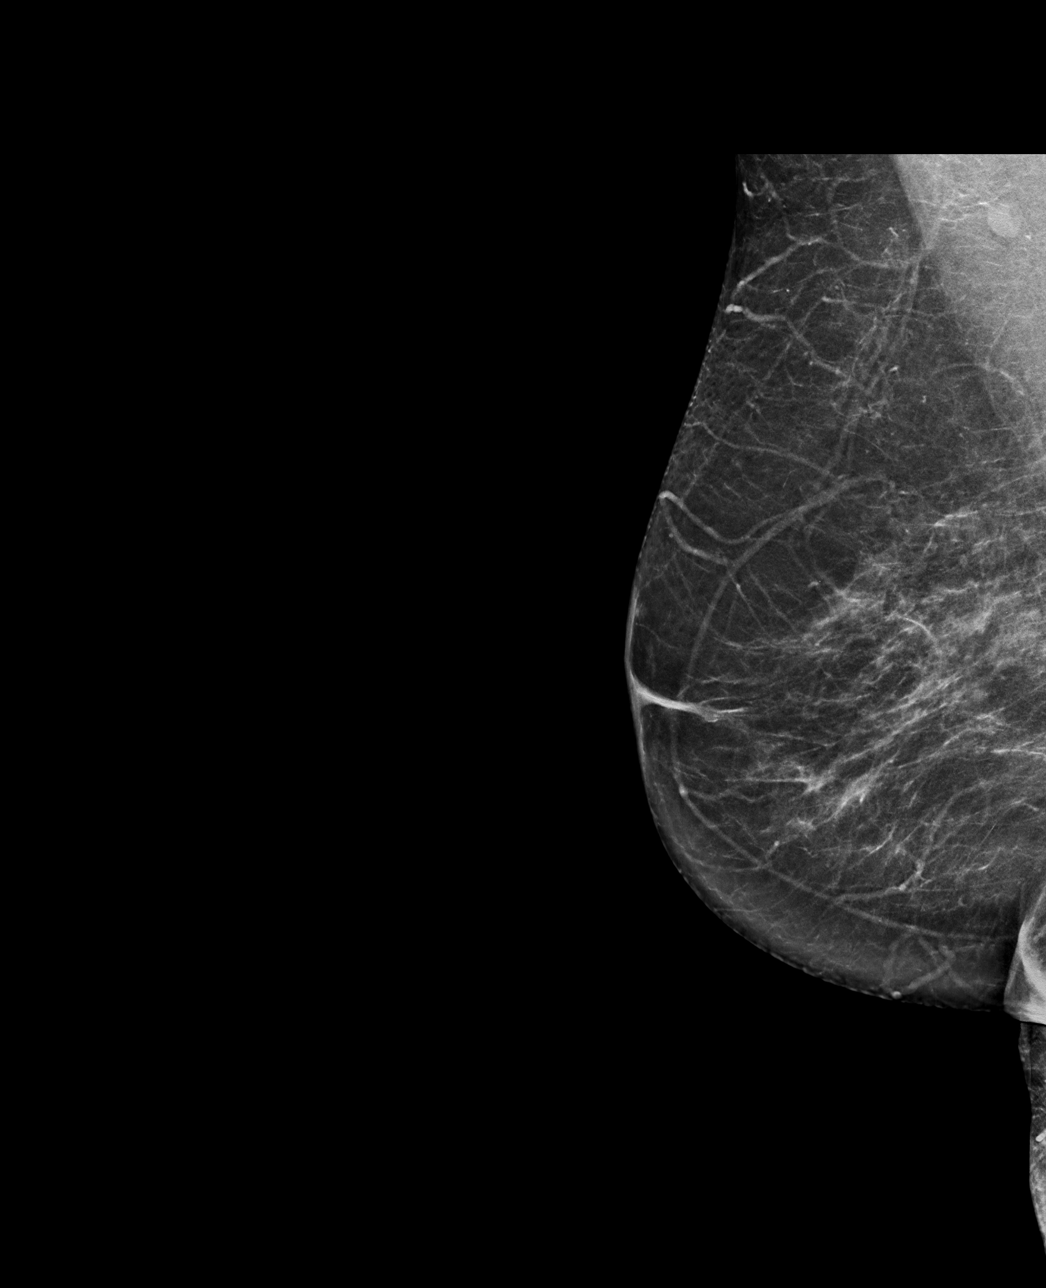

[L MLO synth-2D]
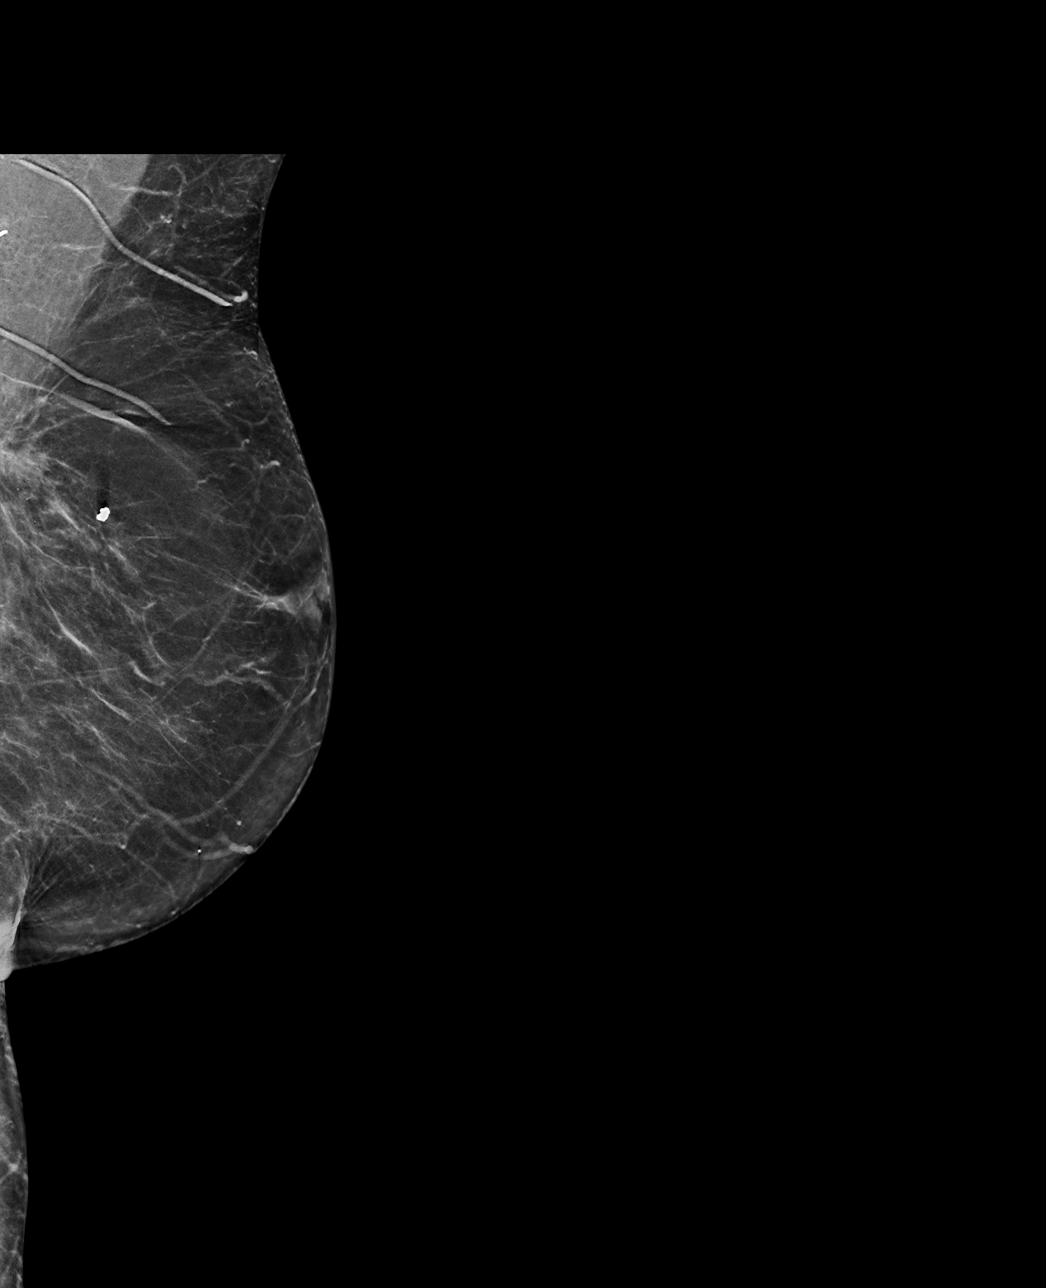

[L CC synth-2D]
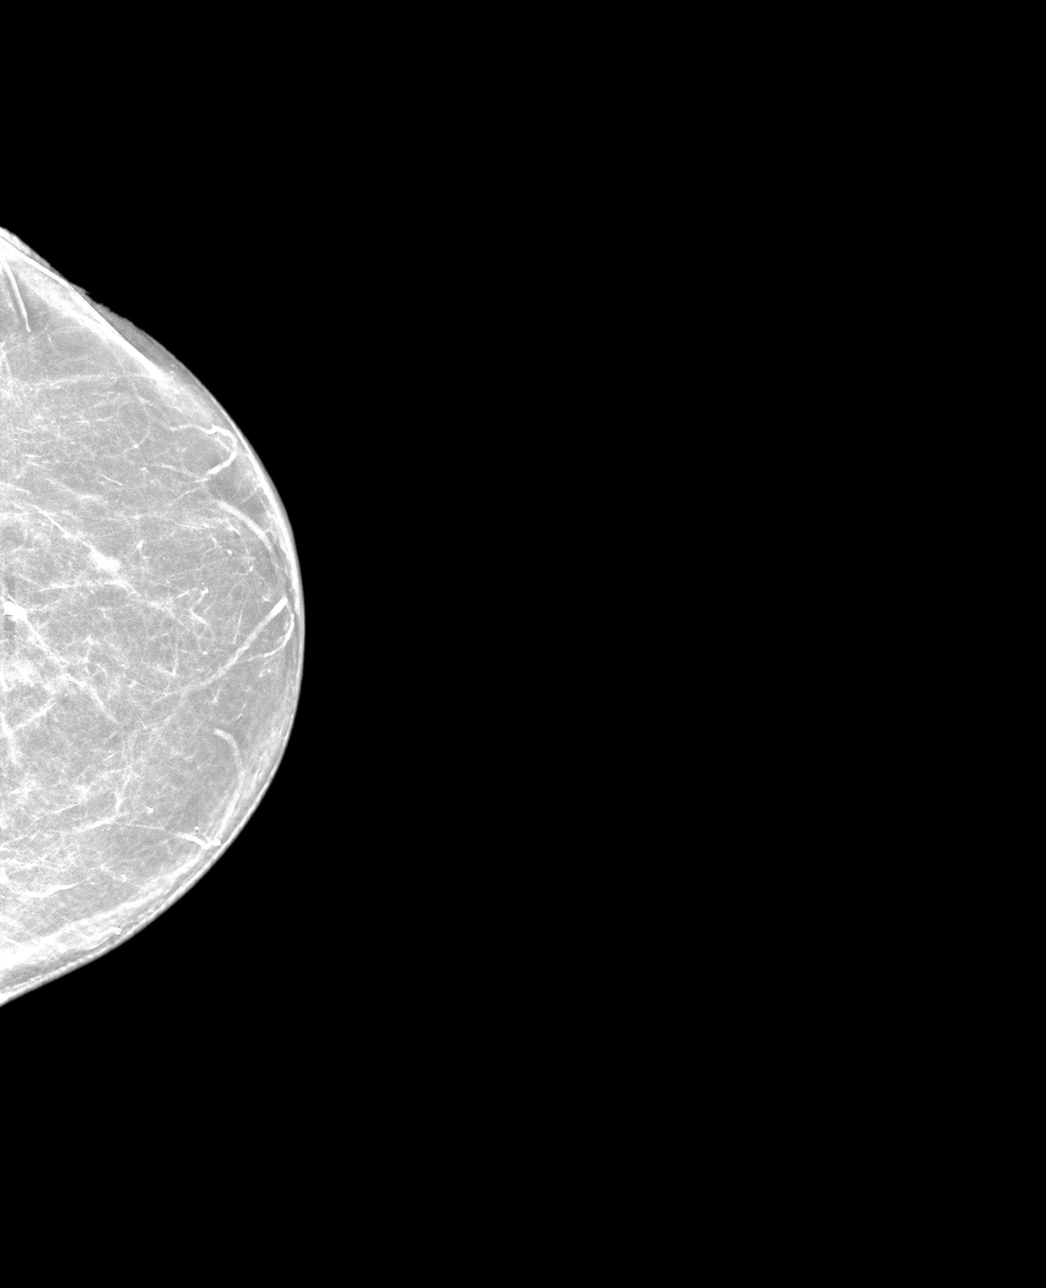

[R MLO tomo · tomo slice 37/72.0]
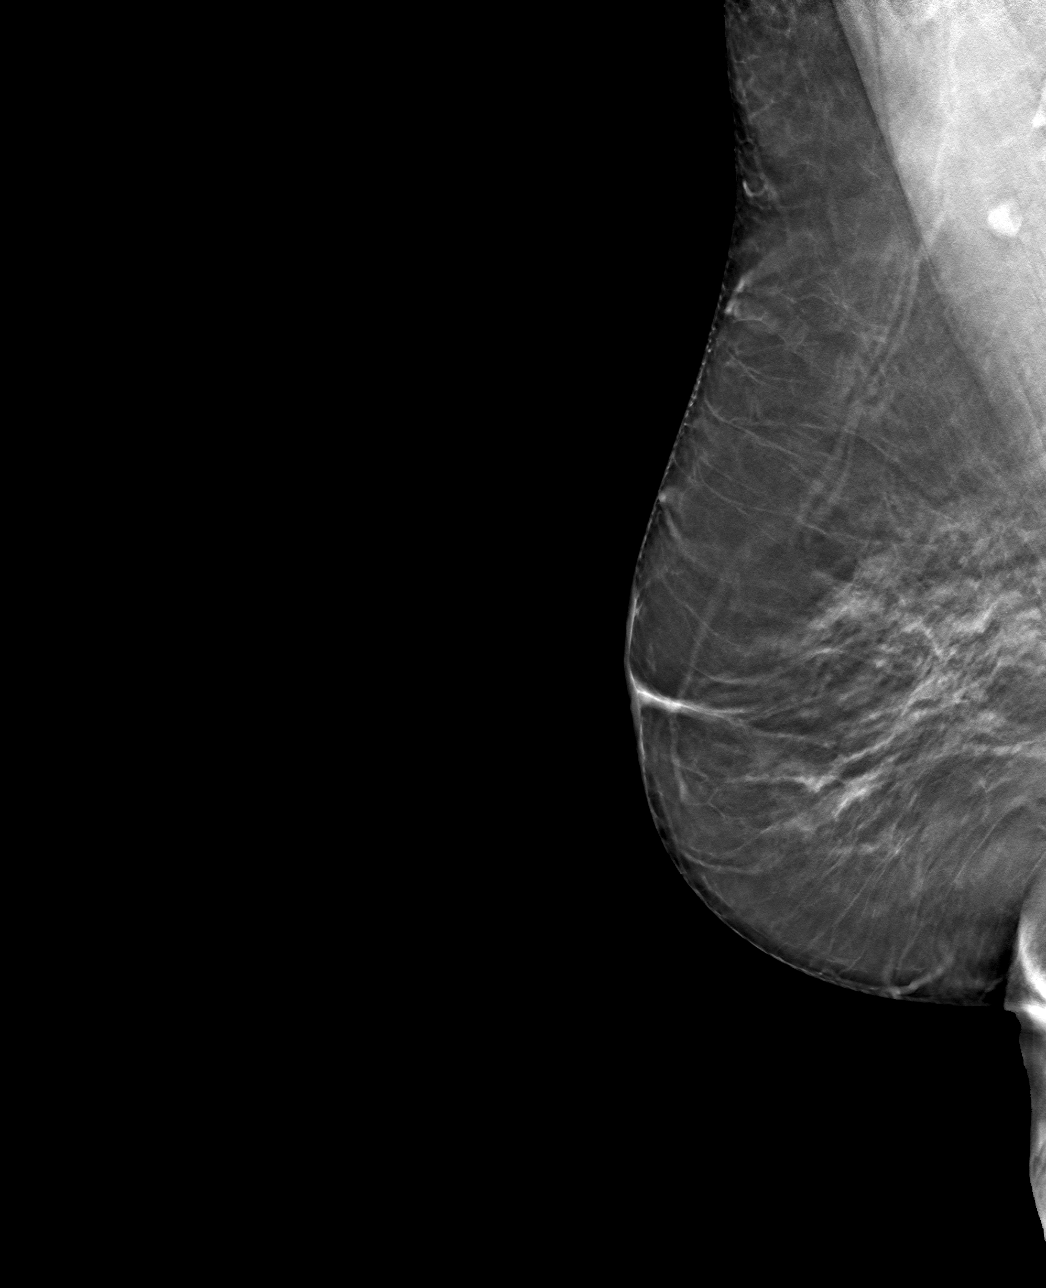

[4 of 8 positions shown; findings below may reference images not displayed]

ACR Breast Density Category b: There are scattered areas of
fibroglandular density.
FINDINGS: There are no findings suspicious for malignancy. Images were
processed with CAD.
IMPRESSION: No mammographic evidence of malignancy. A result letter of this
screening mammogram will be mailed directly to the patient.

RECOMMENDATION:
Screening mammogram in one year. (Code:[TQ])

BI-RADS CATEGORY  1: Negative.

## 2019-02-10 ENCOUNTER — Ambulatory Visit (INDEPENDENT_AMBULATORY_CARE_PROVIDER_SITE_OTHER): Payer: Medicare Other

## 2019-02-10 ENCOUNTER — Ambulatory Visit (INDEPENDENT_AMBULATORY_CARE_PROVIDER_SITE_OTHER): Payer: Medicare Other | Admitting: Podiatry

## 2019-02-10 ENCOUNTER — Other Ambulatory Visit: Payer: Self-pay | Admitting: Podiatry

## 2019-02-10 DIAGNOSIS — Q828 Other specified congenital malformations of skin: Secondary | ICD-10-CM

## 2019-02-10 DIAGNOSIS — G5761 Lesion of plantar nerve, right lower limb: Secondary | ICD-10-CM

## 2019-02-10 DIAGNOSIS — M79671 Pain in right foot: Secondary | ICD-10-CM

## 2019-02-10 DIAGNOSIS — D2370 Other benign neoplasm of skin of unspecified lower limb, including hip: Secondary | ICD-10-CM | POA: Diagnosis not present

## 2019-02-11 ENCOUNTER — Ambulatory Visit: Payer: Medicare Other | Admitting: Podiatry

## 2019-02-11 NOTE — Progress Notes (Signed)
She presents today chief complaint of painful calluses bilaterally.  She is also complaining of an area to the third interdigital space of the right foot.  Objective: Vital signs are stable she is alert and oriented x3 palpable Mulder's click third interspace of the right foot.  Reactive hyperkeratosis plantar aspect of the bilateral foot left greater than that of the right.  Pulses are palpable no open lesions or wounds.  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  Neuroma third interdigital space right foot.  Assessment: Pain in limb secondary to porokeratosis.  Pain in limb secondary to neuroma.  Plan: After sterile Betadine skin prep I injected 10 mg Kenalog 5 mg Marcaine point maximal tenderness of the right third interdigital space.  Tolerated procedure well without complications.  Also debrided all reactive hyperkeratotic tissue and placed salicylic acid under occlusion to be left on for 3 days and then washed off thoroughly she understands this is amenable to it we will follow-up with me as needed.

## 2019-02-25 DIAGNOSIS — Z1211 Encounter for screening for malignant neoplasm of colon: Secondary | ICD-10-CM | POA: Diagnosis not present

## 2019-02-25 DIAGNOSIS — I83811 Varicose veins of right lower extremities with pain: Secondary | ICD-10-CM | POA: Diagnosis not present

## 2019-02-25 DIAGNOSIS — E785 Hyperlipidemia, unspecified: Secondary | ICD-10-CM | POA: Diagnosis not present

## 2019-02-25 DIAGNOSIS — R079 Chest pain, unspecified: Secondary | ICD-10-CM | POA: Diagnosis not present

## 2019-03-03 ENCOUNTER — Telehealth: Payer: Self-pay | Admitting: Cardiology

## 2019-03-03 NOTE — Telephone Encounter (Signed)
Received a call from patient she stated she has chest soreness.Stated she was reaching high on a shelf yesterday and one of her pocketbooks hit her in chest.Stated she sneezed and has chest soreness.Stated she has appointment to see Dr.Christopher tomorrow for sob.Advised to keep appointment as planned.

## 2019-03-04 ENCOUNTER — Encounter: Payer: Self-pay | Admitting: Cardiology

## 2019-03-04 ENCOUNTER — Ambulatory Visit (INDEPENDENT_AMBULATORY_CARE_PROVIDER_SITE_OTHER): Payer: Medicare Other | Admitting: Cardiology

## 2019-03-04 ENCOUNTER — Other Ambulatory Visit: Payer: Self-pay

## 2019-03-04 VITALS — BP 120/82 | HR 77 | Ht 65.5 in | Wt 184.4 lb

## 2019-03-04 DIAGNOSIS — R072 Precordial pain: Secondary | ICD-10-CM

## 2019-03-04 DIAGNOSIS — R0602 Shortness of breath: Secondary | ICD-10-CM | POA: Diagnosis not present

## 2019-03-04 DIAGNOSIS — Z7189 Other specified counseling: Secondary | ICD-10-CM

## 2019-03-04 MED ORDER — METOPROLOL TARTRATE 25 MG PO TABS: 25.0000 mg | ORAL_TABLET | Freq: Once | ORAL | 0 refills | Status: DC

## 2019-03-04 NOTE — Progress Notes (Signed)
Cardiology Office Note:    Date:  03/04/2019   ID:  Daleisa Halperin, DOB 08/17/1946, MRN [id number]  PCP:  Antony Contras, MD  Cardiologist:  Buford Dresser, MD PhD (not seen since 2015, [place] [facility])  Referring MD: Antony Contras, MD   CC: re-establish care, chest pain and dyspnea on exertion  History of Present Illness:    Yvonne Jordan is a 73 y.o. 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  Chest pain and shortness of breath: -Initial onset: several years, not getting more frequent. (notes from 2014 mention it) -Quality: Pain 1: from right neck down right arm, not exertional, not positional. Stays all day and hurts. Better with a hot shower or taking tylenol. Nothing clearly makes it worse. Not associated with shortness of breath at the same time. Pain 2: tightness across chest, occurred twice, most recently several weeks ago. Recently had a handbag fall on her chest, was very painful. Pain had been slowly getting better. Worse with sneezing, pressing on it. Better when she sleeps or just gives it time. -Frequency: SOB: once a month, at rest (notices it as she goes to sit down, feels like she can't breath). -Duration: SOB lasts one minute, pain can last all day -Associated symptoms: no nausea, vomiting, diaphoresis -Prior cardiac history: normal sinus rhythm -Prior ECG: normal sinus rhythm -Prior workup: stress test 2014 was normal (treadmill nuc), reports distant treadmill test. 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 -Cardiac ROS: no shortness of breath, no PND, no orthopnea, no LE edema, no syncope -Family history: sister died of MI in her  83s, brother had MI, mother had MI, many members with strokes (mom, dad both).   Past Medical History:  Diagnosis Date  . Allergic rhinitis   . Cancer (HCC)    Breast  . CKD (chronic kidney disease)   . Colon polyp   . Hyperlipidemia   . Personal history of radiation therapy   . TIA (transient ischemic attack)   . Vitamin D deficiency     Past Surgical History:  Procedure Laterality Date  . BREAST LUMPECTOMY Left 2006  . BREAST SURGERY     2006...treated with Tamoxifen for 5 years  . CHOLECYSTECTOMY      Current Medications: Current Outpatient Medications on File Prior to Visit  Medication Sig  . Ascorbic Acid (VITAMIN C) 1000 MG tablet Take 1,000 mg by mouth daily.  Marland Kitchen atorvastatin (LIPITOR) 20 MG tablet   . Cholecalciferol (VITAMIN D3) 2000 UNITS TABS Take by mouth.  . clopidogrel (PLAVIX) 75 MG tablet Take 75 mg by mouth daily with breakfast.  . Cyanocobalamin (VITAMIN B12 PO) Take 5,000 mcg by mouth.  . diclofenac sodium (VOLTAREN) 1 % GEL   . Multiple Vitamin (MULTIVITAMIN) capsule Take 1 capsule by mouth daily.  . pantoprazole (PROTONIX) 40 MG tablet Take 1 tablet (40 mg total) by mouth 2 (two) times daily before a meal. For 30 days   No current facility-administered medications on file prior to visit.      Allergies:   Other   Social History   Socioeconomic History  . Marital status: Married    Spouse name: Not on file  . Number  of children: Not on file  . Years of education: Not on file  . Highest education level: Not on file  Occupational History  . Occupation: Press photographer  Social Needs  . Financial resource strain: Not on file  . Food insecurity:    Worry: Not on file    Inability: Not on file  . Transportation needs:    Medical: Not on file    Non-medical: Not on file  Tobacco Use  . Smoking status: Never Smoker  . Smokeless tobacco: Never Used  Substance and Sexual Activity  . Alcohol use: Yes    Alcohol/week: 0.0 standard drinks    Comment: 1  drink per wk  . Drug use: No  . Sexual activity: Not on file  Lifestyle  . Physical activity:    Days per week: Not on file    Minutes per session: Not on file  . Stress: Not on file  Relationships  . Social connections:    Talks on phone: Not on file    Gets together: Not on file    Attends religious service: Not on file    Active member of club or organization: Not on file    Attends meetings of clubs or organizations: Not on file    Relationship status: Not on file  Other Topics Concern  . Not on file  Social History Narrative   Drinks about 2 cups of coffee a day      Family History: The patient's family history includes Breast cancer in her sister; Deep vein thrombosis in her mother; Heart attack in her sister; Heart disease in her father, maternal uncle, maternal uncle, and maternal uncle; Stroke in her father and mother.  ROS:   Please see the history of present illness.  Additional pertinent ROS:  Constitutional: Negative for chills, fever, night sweats, unintentional weight loss  HENT: Negative for ear pain and hearing loss.   Eyes: Negative for loss of vision and eye pain.  Respiratory: Negative for cough, sputum, shortness of breath, wheezing.   Cardiovascular: See HPI. Gastrointestinal: Negative for abdominal pain, melena, and hematochezia.  Genitourinary: Negative for dysuria and hematuria.  Musculoskeletal: Negative for falls and myalgias.  Skin: Negative for itching and rash.  Neurological: Negative for focal weakness, focal sensory changes and loss of consciousness.  Endo/Heme/Allergies: Does not bruise/bleed easily.    EKGs/Labs/Other Studies Reviewed:    The following studies were reviewed today: Prior notes and stress test results  EKG:  EKG is personally reviewed.  The ekg ordered today demonstrates normal sinus rhythm  Recent Labs: No results found for requested labs within last 8760 hours.  Recent Lipid Panel No results found for: CHOL, TRIG,  HDL, CHOLHDL, VLDL, LDLCALC, LDLDIRECT  Physical Exam:    VS:  BP 120/82   Pulse 77   Ht 5' 5.5" (1.664 m)   Wt 184 lb 6.4 oz (83.6 kg)   BMI 30.22 kg/m     Wt Readings from Last 3 Encounters:  03/04/19 184 lb 6.4 oz (83.6 kg)  04/19/16 186 lb 4 oz (84.5 kg)  03/29/16 185 lb (83.9 kg)     GEN: Well nourished, well developed in no acute distress HEENT: Normal NECK: No JVD; No carotid bruits LYMPHATICS: No lymphadenopathy CARDIAC: regular rhythm, normal S1 and S2, no murmurs, rubs, gallops. Radial and DP pulses 2+ bilaterally. RESPIRATORY:  Clear to auscultation without rales, wheezing or rhonchi  ABDOMEN: Soft, non-tender, non-distended MUSCULOSKELETAL:  No edema; No deformity  SKIN: Warm and dry  NEUROLOGIC:  Alert and oriented x 3 PSYCHIATRIC:  Normal affect   ASSESSMENT:    1. Precordial pain   2. Dyspnea on exertion   3. Cardiac risk counseling   4. Counseling on health promotion and disease prevention    PLAN:    Chest pain, Dyspnea on exertion: she does have risk factors, including history of TIA. However, many of her symptoms are atypical. She has had a stress test ~6 years ago which was normal, but she has continued to have symptoms. We discussed options for further workup, and after discussing all options she wishes to proceed with coronary CTA to define anatomy -coronary CTA ordered -BMET prior -ordered one time dose of metoprolol  Cardiac risk counseling and prevention recommendations: -recommend heart healthy/Mediterranean diet, with whole grains, fruits, vegetable, fish, lean meats, nuts, and olive oil. Limit salt. -recommend moderate walking, 3-5 times/week for 30-50 minutes each session. Aim for at least 150 minutes.week. Goal should be pace of 3 miles/hours, or walking 1.5 miles in 30 minutes -recommend avoidance of tobacco products. Avoid excess alcohol. -Additional risk factor control:  -Diabetes: A1c is not available, denies history  -Lipids: Tchol  166, HDL 84, LDL 58, TG 120 02/25/19  -Blood pressure control: systolic at goal, diastolic within range  -Weight: BMI 30, counseled on lifestyle for weight management -ASCVD risk score: cannot calculate with low LDL   Plan for follow up: 1 year if CTA unremarkable, sooner if needed based on results of testing.  Medication Adjustments/Labs and Tests Ordered: Current medicines are reviewed at length with the patient today.  Concerns regarding medicines are outlined above.  Orders Placed This Encounter  Procedures  . CT CORONARY MORPH W/CTA COR W/SCORE W/CA W/CM &/OR WO/CM  . CT CORONARY FRACTIONAL FLOW RESERVE DATA PREP  . CT CORONARY FRACTIONAL FLOW RESERVE FLUID ANALYSIS  . Basic metabolic panel  . EKG 12-Lead   Meds ordered this encounter  Medications  . metoprolol tartrate (LOPRESSOR) 25 MG tablet    Sig: Take 1 tablet (25 mg total) by mouth once for 1 dose. Two hours prior to your CTA.    Dispense:  1 tablet    Refill:  0    Patient Instructions  Lab work: Your physician recommends that you return for lab work 1 week prior to CTA.  If you have labs (blood work) drawn today and your tests are completely normal, you will receive your results only by: Marland Kitchen MyChart Message (if you have MyChart) OR . A paper copy in the mail If you have any lab test that is abnormal or we need to change your treatment, we will call you to review the results.  Testing/Procedures: Cardiac CT Angiography (CTA), is a special type of CT scan that uses a computer to produce multi-dimensional views of major blood vessels throughout the body. In CT angiography, a contrast material is injected through an IV to help visualize the blood vessels  Please arrive at the [facility] main entrance of [facility] at xx:xx AM (30-45 minutes prior to test start time)  [facility] [place], [place] [address] [phone]  Proceed to the [facility] Radiology Department (First  Floor).  Please follow these instructions carefully (unless otherwise directed):   On the Night Before the Test: . Be sure to Drink plenty of water. . Do not consume any caffeinated/decaffeinated beverages or chocolate 12 hours prior to your test. . Do not take any antihistamines 12 hours prior to  your test.  On the Day of the Test: . Drink plenty of water. Do not drink any water within one hour of the test. . Do not eat any food 4 hours prior to the test. . You may take your regular medications prior to the test.  . Take metoprolol (Lopressor) two hours prior to test.       After the Test: . Drink plenty of water. . After receiving IV contrast, you may experience a mild flushed feeling. This is normal. . On occasion, you may experience a mild rash up to 24 hours after the test. This is not dangerous. If this occurs, you can take Benadryl 25 mg and increase your fluid intake. . If you experience trouble breathing, this can be serious. If it is severe call 911 IMMEDIATELY. If it is mild, please call our office. . If you take any of these medications: Glipizide/Metformin, Avandament, Glucavance, please do not take 48 hours after completing test.  Follow-Up: At [facility], you and your health needs are our priority.  As part of our continuing mission to provide you with exceptional heart care, we have created designated Provider Care Teams.  These Care Teams include your primary Cardiologist (physician) and Advanced Practice Providers (APPs -  Physician Assistants and Nurse Practitioners) who all work together to provide you with the care you need, when you need it. You will need a follow up appointment in 1 year.  Please call our office 2 months in advance to schedule this appointment.  You may see Dr. Harrell Gave or one of the following Advanced Practice Providers on your designated Care Team:   Nariyah Osias, PA-C . Jory Sims, DNP, ANP       Signed, Buford Dresser, MD PhD 03/04/2019 8:10 AM    [place]

## 2019-03-04 NOTE — Patient Instructions (Addendum)
Lab work: Your physician recommends that you return for lab work 1 week prior to CTA.  If you have labs (blood work) drawn today and your tests are completely normal, you will receive your results only by: Marland Kitchen MyChart Message (if you have MyChart) OR . A paper copy in the mail If you have any lab test that is abnormal or we need to change your treatment, we will call you to review the results.  Testing/Procedures: Cardiac CT Angiography (CTA), is a special type of CT scan that uses a computer to produce multi-dimensional views of major blood vessels throughout the body. In CT angiography, a contrast material is injected through an IV to help visualize the blood vessels  Please arrive at the [facility] main entrance of [facility] at xx:xx AM (30-45 minutes prior to test start time)  [facility] [place], [place] [address] [phone]  Proceed to the [facility] Radiology Department (First Floor).  Please follow these instructions carefully (unless otherwise directed):   On the Night Before the Test: . Be sure to Drink plenty of water. . Do not consume any caffeinated/decaffeinated beverages or chocolate 12 hours prior to your test. . Do not take any antihistamines 12 hours prior to your test.  On the Day of the Test: . Drink plenty of water. Do not drink any water within one hour of the test. . Do not eat any food 4 hours prior to the test. . You may take your regular medications prior to the test.  . Take metoprolol (Lopressor) two hours prior to test.       After the Test: . Drink plenty of water. . After receiving IV contrast, you may experience a mild flushed feeling. This is normal. . On occasion, you may experience a mild rash up to 24 hours after the test. This is not dangerous. If this occurs, you can take Benadryl 25 mg and increase your fluid intake. . If you experience trouble breathing, this can be serious. If it is severe call 911  IMMEDIATELY. If it is mild, please call our office. . If you take any of these medications: Glipizide/Metformin, Avandament, Glucavance, please do not take 48 hours after completing test.  Follow-Up: At [facility], you and your health needs are our priority.  As part of our continuing mission to provide you with exceptional heart care, we have created designated Provider Care Teams.  These Care Teams include your primary Cardiologist (physician) and Advanced Practice Providers (APPs -  Physician Assistants and Nurse Practitioners) who all work together to provide you with the care you need, when you need it. You will need a follow up appointment in 1 year.  Please call our office 2 months in advance to schedule this appointment.  You may see Dr. Harrell Gave or one of the following Advanced Practice Providers on your designated Care Team:   Brieonna Crutcher, PA-C . Jory Sims, DNP, ANP

## 2019-04-14 ENCOUNTER — Ambulatory Visit: Payer: Medicare Other | Admitting: Podiatry

## 2019-04-22 ENCOUNTER — Ambulatory Visit: Payer: Medicare Other | Admitting: Interventional Cardiology

## 2019-04-30 ENCOUNTER — Telehealth: Payer: Self-pay | Admitting: Vascular Surgery

## 2019-04-30 NOTE — Telephone Encounter (Signed)
Virtual Visit Pre-Appointment Phone Call  Today, I spoke with Yvonne Jordan and performed the following actions:  I explained that we are currently trying to limit exposure to the COVID-19 virus by seeing patients at home rather than in the office.  I explained that the visits are best done by video, but can be done by telephone.  I asked the patient if a virtual visit that the patient would like to try instead of coming into the office. Yvonne Jordan agreed to proceed with the virtual visit scheduled with Dr. Donzetta Matters on May 01, 2019.    1. I confirmed the BEST phone number to call the day of the visit and- I included this in appointment notes.  2. I asked if the patient had access to (through a family member/friend) a smartphone with video capability to be used for hervisit?"  The patient said YES. I documented "VIDEO" in the appointment notes.   3. I confirmed consent verbally as listed below (a-f). a. This visit is being performed in the setting of COVID-19 with the patients provider on May 8 at 11:15 am. b. All virtual visits are billed to your insurance company just like a normal visit would be.   c. We'd like you to understand that the technology does not allow for your provider to perform an examination, and thus may limit your provider's ability to fully assess your condition.  d. If your provider identifies any concerns that need to be evaluated in person, we will make arrangements to do so.   e. Finally, though the technology is pretty good, we cannot assure that it will always work on either your or our end, and in the setting of a video visit, we may have to convert it to a phone-only visit.  In either situation, we cannot ensure that we have a secure connection.   Yvonne Jordan you willing to proceed?" STAFF: Did the patient verbally acknowledge consent to telehealth visit? Document YES/NO here: YES  2. I advised the patient to be prepared - I asked that the patient, on the day  of her visit, record any information possible with the equipment at her home, such as blood pressure, pulse, oxygen saturation, and your weight and write them all down. I asked the patient to have a pen and paper handy nearby the day of the visit as well.  3. I informed the patient that the visit with the doctor would start with a text to the smartphone number given to [place] by the patient.   4. I Informed patient they will receive a phone call 15 minutes prior to their appointment time from a [place] or nurse to review medications, allergies, etc. to prepare for the visit.    TELEPHONE CALL NOTE  Yvonne Jordan has been deemed a candidate for a follow-up tele-health visit to limit community exposure during the Covid-19 pandemic. I spoke with the patient via phone to ensure availability of phone/video source, confirm preferred email & phone number, and discuss instructions and expectations.  I reminded Yvonne Jordan to be prepared with any vital sign and/or heart rhythm information that could potentially be obtained via home monitoring, at the time of her visit. I reminded Yvonne Jordan to expect a phone call prior to her visit.  Ovidio Hanger 04/30/2019 4:13 PM     FULL LENGTH CONSENT FOR TELE-HEALTH VISIT   I hereby voluntarily request, consent and authorize [facility] and its employed or  contracted physicians, physician assistants, nurse practitioners or other licensed health care professionals (the Practitioner), to provide me with telemedicine health care services (the Services") as deemed necessary by the treating Practitioner. I acknowledge and consent to receive the Services by the Practitioner via telemedicine. I understand that the telemedicine visit will involve communicating with the Practitioner through live audiovisual communication technology and the disclosure of certain medical information by electronic transmission. I acknowledge that I have been given the  opportunity to request an in-person assessment or other available alternative prior to the telemedicine visit and am voluntarily participating in the telemedicine visit.  I understand that I have the right to withhold or withdraw my consent to the use of telemedicine in the course of my care at any time, without affecting my right to future care or treatment, and that the Practitioner or I may terminate the telemedicine visit at any time. I understand that I have the right to inspect all information obtained and/or recorded in the course of the telemedicine visit and may receive copies of available information for a reasonable fee.  I understand that some of the potential risks of receiving the Services via telemedicine include:   Delay or interruption in medical evaluation due to technological equipment failure or disruption;  Information transmitted may not be sufficient (e.g. poor resolution of images) to allow for appropriate medical decision making by the Practitioner; and/or   In rare instances, security protocols could fail, causing a breach of personal health information.  Furthermore, I acknowledge that it is my responsibility to provide information about my medical history, conditions and care that is complete and accurate to the best of my ability. I acknowledge that Practitioner's advice, recommendations, and/or decision may be based on factors not within their control, such as incomplete or inaccurate data provided by me or distortions of diagnostic images or specimens that may result from electronic transmissions. I understand that the practice of medicine is not an exact science and that Practitioner makes no warranties or guarantees regarding treatment outcomes. I acknowledge that I will receive a copy of this consent concurrently upon execution via email to the email address I last provided but may also request a printed copy by calling the office of [facility].    I understand that  my insurance will be billed for this visit.   I have read or had this consent read to me.  I understand the contents of this consent, which adequately explains the benefits and risks of the Services being provided via telemedicine.   I have been provided ample opportunity to ask questions regarding this consent and the Services and have had my questions answered to my satisfaction.  I give my informed consent for the services to be provided through the use of telemedicine in my medical care  By participating in this telemedicine visit I agree to the above.

## 2019-05-01 ENCOUNTER — Ambulatory Visit (INDEPENDENT_AMBULATORY_CARE_PROVIDER_SITE_OTHER): Payer: Medicare Other | Admitting: Vascular Surgery

## 2019-05-01 ENCOUNTER — Other Ambulatory Visit: Payer: Self-pay

## 2019-05-01 ENCOUNTER — Encounter: Payer: Self-pay | Admitting: Vascular Surgery

## 2019-05-01 VITALS — Ht 65.0 in | Wt 184.0 lb

## 2019-05-01 DIAGNOSIS — I83891 Varicose veins of right lower extremities with other complications: Secondary | ICD-10-CM

## 2019-05-01 NOTE — Progress Notes (Signed)
Virtual Visit via Video Note   I connected with Yvonne Jordan on 05/01/2019 using the [employer] virtual platform and verified that I was speaking with the correct person using two identifiers. Patient was located at home in her son room and was alone. I am located in my office of [facility].   The limitations of evaluation and management by telemedicine and the availability of in person appointments have been previously discussed with the patient and are documented in the patients chart. The patient expressed understanding and consented to proceed.   PCP: Antony Contras, MD  Chief Complaint: Right lower extremity varicose veins  History of Present Illness: Yvonne Jordan is a 73 y.o. female with history of right lower extremity varicose veins.  Has never had bleeding from these.  Does not have history of deep venous thrombosis.  No history of injury.  Does have some associated swelling.  Mother had varicose veins she does not know if they were treated or not.  She had 2 pregnancies did not make the varicose veins worse.  States they do cause pain and discomfort.  She has worn nonmedical grade knee-high compression stockings without much resolution.  Past Medical History:  Diagnosis Date  . Allergic rhinitis   . Cancer (HCC)    Breast  . CKD (chronic kidney disease)   . Colon polyp   . Hyperlipidemia   . Personal history of radiation therapy   . TIA (transient ischemic attack)   . Vitamin D deficiency     Past Surgical History:  Procedure Laterality Date  . BREAST LUMPECTOMY Left 2006  . BREAST SURGERY     2006...treated with Tamoxifen for 5 years  . CHOLECYSTECTOMY      Current Meds  Medication Sig  . Ascorbic Acid (VITAMIN C) 1000 MG tablet Take 1,000 mg by mouth daily.  . Cholecalciferol (VITAMIN D3) 2000 UNITS TABS Take by mouth.  . clopidogrel (PLAVIX) 75 MG tablet Take 75 mg by mouth daily with breakfast.  . Multiple Vitamin (MULTIVITAMIN) capsule Take 1 capsule by  mouth daily.  . pantoprazole (PROTONIX) 40 MG tablet Take 1 tablet (40 mg total) by mouth 2 (two) times daily before a meal. For 30 days    12 system ROS was negative unless otherwise noted in HPI  Observations/Objective: There were no vitals filed for this visit.  Patient did not have a way to check her blood pressure She is awake alert and oriented She is speaking fluently without labored respirations Right lower extremity has apparent varicosities on the medial leg.  She does appear to have some swelling.  Assessment and Plan: 73 year old female with right lower extremity varicosities and swelling.  She has C3 venous disease having not had previous skin changes or ulceration.  She has been a knee-high compression stockings.  We will get her measured for thigh-high compression stockings and have given her the number to elastic therapy.  We will also send her follow-up for 3 months with right lower extremity reflux study.  She demonstrates good understanding.  Follow Up Instructions:  Follow up 3 months  I discussed the assessment and treatment plan with the patient. The patient was provided an opportunity to ask questions and all were answered. The patient agreed with the plan and demonstrated an understanding of the instructions.   The patient was advised to call back or seek an in-person evaluation if the symptoms worsen or if the condition fails to improve as anticipated.  I spent 10 minutes  with the patient via video encounter.   Signed, Servando Snare [facility] Office: [phone]  05/01/2019, 11:16 AM

## 2019-05-06 ENCOUNTER — Telehealth (HOSPITAL_COMMUNITY): Payer: Self-pay | Admitting: Emergency Medicine

## 2019-05-06 NOTE — Telephone Encounter (Signed)
Reaching out to patient to offer assistance regarding upcoming cardiac imaging study; pt verbalizes understanding of appt date/time, parking situation and where to check in, pre-test NPO status and medications ordered, and verified current allergies; name and call back number provided for further questions should they arise Marchia Bond RN [facility] [phone] office [phone] cell  Pt denies COVID-19 symptoms, verbalized understanding of visitor policy. Did not have labs drawn within the last week, will need an istat creat lab drawn for cardiac CT.

## 2019-05-07 ENCOUNTER — Ambulatory Visit (HOSPITAL_COMMUNITY): Admission: RE | Admit: 2019-05-07 | Payer: Medicare Other | Source: Ambulatory Visit

## 2019-05-07 ENCOUNTER — Other Ambulatory Visit: Payer: Self-pay

## 2019-05-07 ENCOUNTER — Encounter: Payer: Medicare Other | Admitting: *Deleted

## 2019-05-07 ENCOUNTER — Ambulatory Visit (HOSPITAL_COMMUNITY)
Admission: RE | Admit: 2019-05-07 | Discharge: 2019-05-07 | Disposition: A | Discharge status: Home / Self Care | Payer: Medicare Other | Source: Ambulatory Visit | Attending: Cardiology | Admitting: Cardiology

## 2019-05-07 DIAGNOSIS — Z006 Encounter for examination for normal comparison and control in clinical research program: Secondary | ICD-10-CM

## 2019-05-07 DIAGNOSIS — R0602 Shortness of breath: Secondary | ICD-10-CM | POA: Diagnosis not present

## 2019-05-07 DIAGNOSIS — R072 Precordial pain: Secondary | ICD-10-CM | POA: Diagnosis not present

## 2019-05-07 DIAGNOSIS — Z7189 Other specified counseling: Secondary | ICD-10-CM | POA: Diagnosis not present

## 2019-05-07 LAB — POCT I-STAT CREATININE: Creatinine, Ser: 1.1 mg/dL — ABNORMAL HIGH (ref 0.44–1.00)

## 2019-05-07 IMAGING — CT CT HEAR MORPH WITH CTA COR WITH SCORE WITH CA WITH CONTRAST AND
4 of 7 series · 8 of 20 positions shown, 9 images · non-contrast
Comparison: Chest radiograph [DATE]
COMPARISON: Chest radiograph [DATE]

Addendum:
EXAM:
OVER-READ INTERPRETATION  CT CHEST

The following report is an over-read performed by radiologist Dr.
LEM [REDACTED] on [DATE]. This over-read
does not include interpretation of cardiac or coronary anatomy or
pathology. The coronary CTA interpretation by the cardiologist is
attached.
CLINICAL DATA: 72F with hyperlipidemia, TIA, chest pain and
shortness of breath.
Cardiac/Coronary  CT
TECHNIQUE: The patient was scanned on a Phillips Force scanner.

[Series 6: best diast 76 % · axial · 0.33mm/px · z∈[+1156,+1193]mm · 2 of 278 slices shown, 3 images]
[im 93/278  vessel]
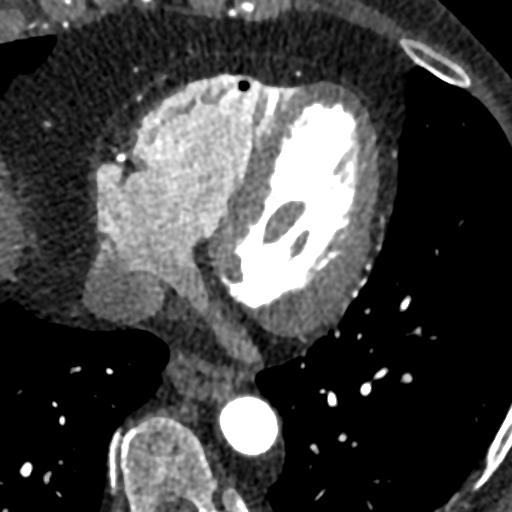
[im 93/278  lung]
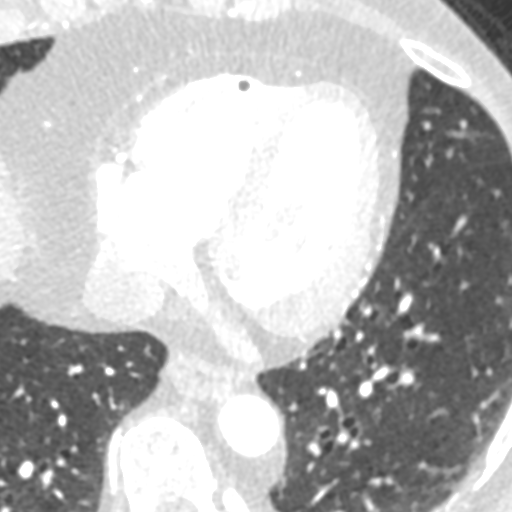
[im 185/278  vessel]
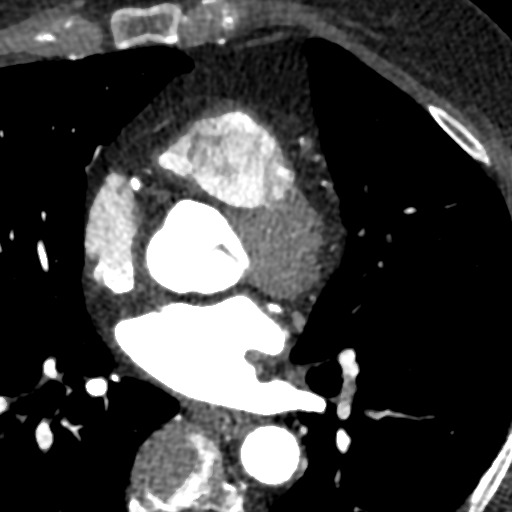

[Series 7: best syst 43 % · axial · 0.33mm/px · z∈[+1156,+1193]mm · 2 of 278 slices shown]
[im 93/278  vessel]
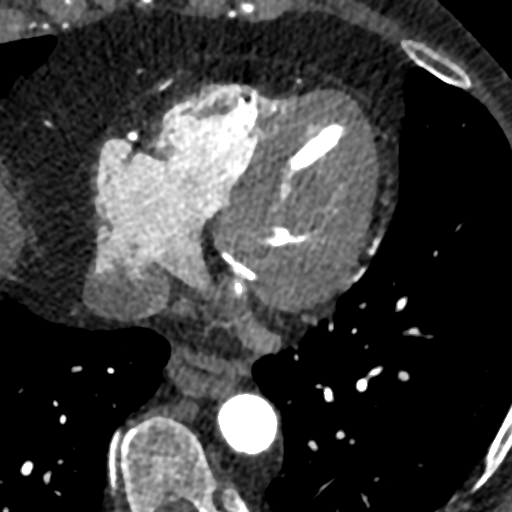
[im 185/278  vessel]
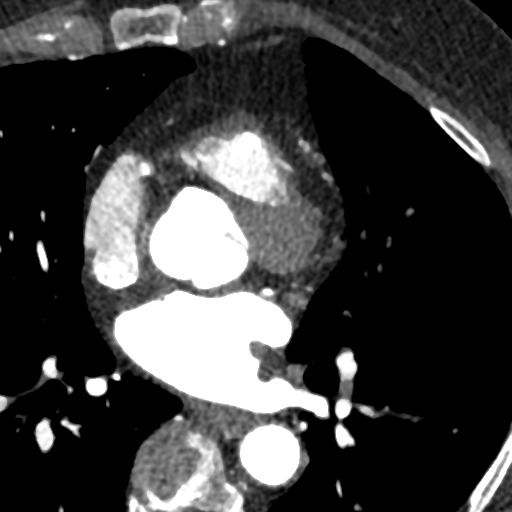

[Series 8: ts diast sharp 76 % · axial · 0.33mm/px · z∈[+1156,+1193]mm · 2 of 278 slices shown]
[im 93/278  lung]
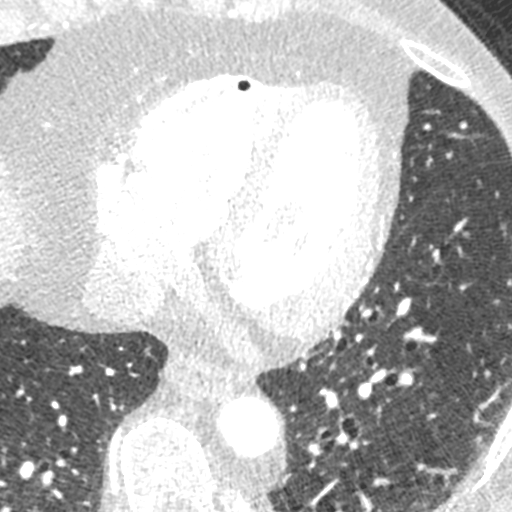
[im 185/278  lung]
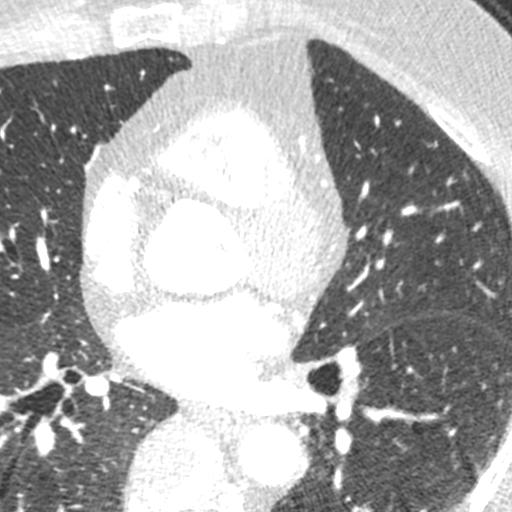

[Series 9: ts syst sharp 43 % · axial · 0.33mm/px · z∈[+1156,+1193]mm · 2 of 278 slices shown]
[im 93/278  lung]
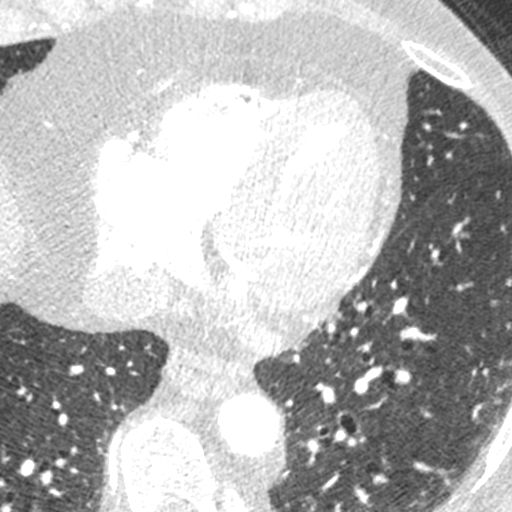
[im 185/278  lung]
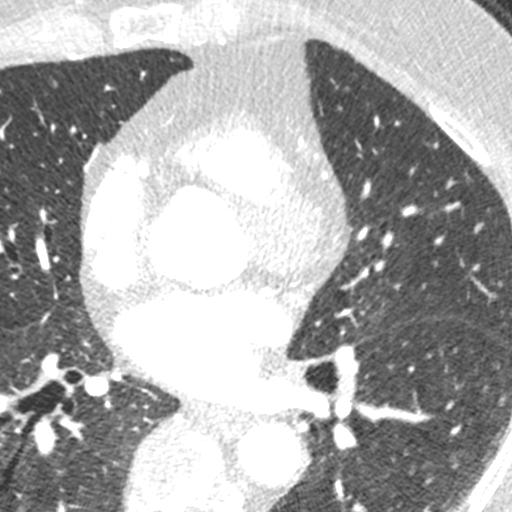

[8 of 20 positions shown; findings below may reference images not displayed]

FINDINGS: Vascular: Aortic atherosclerosis. No central pulmonary embolism, on
this non-dedicated study.

Mediastinum/Nodes: No mediastinal adenopathy. Upper normal bilateral
infrahilar nodes, including at 7 mm on the left. Favored to be
reactive. Small hiatal hernia.

Lungs/Pleura: No pleural fluid.  Clear imaged lungs.

Upper Abdomen: Left hepatic lobe 1.4 cm cyst. Normal imaged portions
of the spleen.

Musculoskeletal: No acute osseous abnormality.
IMPRESSION: No acute findings in the imaged extracardiac chest.

Small hiatal hernia.
FINDINGS: A 120 kV prospective scan was triggered in the descending thoracic
aorta at 111 HU's. Axial non-contrast 3 mm slices were carried out
through the heart. The data set was analyzed on a dedicated work
station and scored using the Agatson method. Gantry rotation speed
was 250 msecs and collimation was .6 mm. No beta blockade and 0.8 mg
of sl NTG was given. The 3D data set was reconstructed in 5%
intervals of the 67-82 % of the R-R cycle. Diastolic phases were
analyzed on a dedicated work station using MPR, MIP and VRT modes.
The patient received 80 cc of contrast.

Aorta: Normal size. Ascending aorta 2.5 cm. Mild calcification of
the descending aorta. No dissection.

Aortic Valve:  Trileaflet.  No calcifications.

Coronary Arteries:  Normal coronary origin.  Right dominance.

RCA is a large dominant artery that gives rise to PDA and PLVB.
There is no plaque.

Left main is a large artery that gives rise to LAD, LCX, and RI
arteries.

LAD is a large vessel that has no plaque. There are two large
diagonal branches without plaque.

LCX is a non-dominant artery that gives rise to a small OM1 and
large OM2 branch. There is no plaque.

RI is a small branch without any significant plaque.

Other findings:

Normal pulmonary vein drainage into the left atrium.

Normal let atrial appendage without a thrombus.

Normal size of the pulmonary artery.
IMPRESSION: 1. Coronary calcium score of 0. This was 0 percentile for age and
sex matched control.

2. Normal coronary origin with right dominance.

3. No evidence of CAD.

*** End of Addendum ***
EXAM:
OVER-READ INTERPRETATION  CT CHEST

The following report is an over-read performed by radiologist Dr.
LEM [REDACTED] on [DATE]. This over-read
does not include interpretation of cardiac or coronary anatomy or
pathology. The coronary CTA interpretation by the cardiologist is
attached.
FINDINGS: Vascular: Aortic atherosclerosis. No central pulmonary embolism, on
this non-dedicated study.

Mediastinum/Nodes: No mediastinal adenopathy. Upper normal bilateral
infrahilar nodes, including at 7 mm on the left. Favored to be
reactive. Small hiatal hernia.

Lungs/Pleura: No pleural fluid.  Clear imaged lungs.

Upper Abdomen: Left hepatic lobe 1.4 cm cyst. Normal imaged portions
of the spleen.

Musculoskeletal: No acute osseous abnormality.
IMPRESSION: No acute findings in the imaged extracardiac chest.

Small hiatal hernia.

## 2019-05-07 MED ORDER — METOPROLOL TARTRATE 5 MG/5ML IV SOLN
5.0000 mg | INTRAVENOUS | Status: DC | PRN
  Administered 2019-05-07 (×2): 5 mg via INTRAVENOUS
  Filled 2019-05-07 (×3): qty 5

## 2019-05-07 MED ORDER — IOHEXOL 350 MG/ML SOLN
100.0000 mL | Freq: Once | INTRAVENOUS | Status: AC | PRN
  Administered 2019-05-07: 100 mL via INTRAVENOUS

## 2019-05-07 MED ORDER — NITROGLYCERIN 0.4 MG SL SUBL
0.8000 mg | SUBLINGUAL_TABLET | SUBLINGUAL | Status: DC | PRN
  Administered 2019-05-07: 0.8 mg via SUBLINGUAL
  Filled 2019-05-07 (×2): qty 25

## 2019-05-07 MED ORDER — NITROGLYCERIN 0.4 MG SL SUBL
SUBLINGUAL_TABLET | SUBLINGUAL | Status: AC
  Filled 2019-05-07: qty 2

## 2019-05-07 MED ORDER — METOPROLOL TARTRATE 5 MG/5ML IV SOLN
INTRAVENOUS | Status: AC
  Filled 2019-05-07: qty 20

## 2019-05-07 NOTE — Research (Signed)
CADFEM Informed Consent                  Subject Name:   Yvonne Jordan   Subject met inclusion and exclusion criteria.  The informed consent form, study requirements and expectations were reviewed with the subject and questions and concerns were addressed prior to the signing of the consent form.  The subject verbalized understanding of the trial requirements.  The subject agreed to participate in the CADFEM trial and signed the informed consent.  The informed consent was obtained prior to performance of any protocol-specific procedures for the subject.  A copy of the signed informed consent was given to the subject and a copy was placed in the subject's medical record.   [place] Amala Petion, Research Assistant 05/07/2019  07:30 a.m.

## 2019-05-26 DIAGNOSIS — Z20828 Contact with and (suspected) exposure to other viral communicable diseases: Secondary | ICD-10-CM | POA: Diagnosis not present

## 2019-06-16 DIAGNOSIS — H04123 Dry eye syndrome of bilateral lacrimal glands: Secondary | ICD-10-CM | POA: Diagnosis not present

## 2019-06-16 DIAGNOSIS — H0102A Squamous blepharitis right eye, upper and lower eyelids: Secondary | ICD-10-CM | POA: Diagnosis not present

## 2019-06-16 DIAGNOSIS — H25013 Cortical age-related cataract, bilateral: Secondary | ICD-10-CM | POA: Diagnosis not present

## 2019-06-16 DIAGNOSIS — H524 Presbyopia: Secondary | ICD-10-CM | POA: Diagnosis not present

## 2019-06-16 DIAGNOSIS — H2513 Age-related nuclear cataract, bilateral: Secondary | ICD-10-CM | POA: Diagnosis not present

## 2019-07-21 ENCOUNTER — Other Ambulatory Visit: Payer: Self-pay

## 2019-07-21 ENCOUNTER — Encounter: Payer: Self-pay | Admitting: Podiatry

## 2019-07-21 ENCOUNTER — Ambulatory Visit (INDEPENDENT_AMBULATORY_CARE_PROVIDER_SITE_OTHER): Payer: Medicare Other | Admitting: Podiatry

## 2019-07-21 VITALS — Temp 97.0°F

## 2019-07-21 DIAGNOSIS — M778 Other enthesopathies, not elsewhere classified: Secondary | ICD-10-CM

## 2019-07-21 DIAGNOSIS — D689 Coagulation defect, unspecified: Secondary | ICD-10-CM

## 2019-07-21 DIAGNOSIS — Q828 Other specified congenital malformations of skin: Secondary | ICD-10-CM

## 2019-07-21 DIAGNOSIS — M779 Enthesopathy, unspecified: Secondary | ICD-10-CM

## 2019-07-21 NOTE — Progress Notes (Signed)
This patient presents the office with chief complaint of a painful callus on her left forefoot.  She says this callus is painful now that she has returned to work and is standing and walking at work.  She has been unable to self treat.  She presents the office today for an evaluation and treatment of her painful callus left forefoot  Vascular  Dorsalis pedis and posterior tibial pulses are palpable  B/L.  Capillary return  WNL.  Temperature gradient is  WNL.  Skin turgor  WNL  Sensorium  Senn Weinstein monofilament wire  WNL. Normal tactile sensation.  Nail Exam  Patient has normal nails with no evidence of bacterial or fungal infection.  Orthopedic  Exam  Muscle tone and muscle strength  WNL.  No limitations of motion feet  B/L.  No crepitus or joint effusion noted.  Foot type is unremarkable and digits show no abnormalities.  Bony prominences are unremarkable. Hammer toes 2,3 left foot.  Skin  No open lesions.  Normal skin texture and turgor.  Porokeratosis sub 2,3 left forefoot.    Pain due to porokeratosis left forefoot.  ROV  Debride porokeratosis left foot.  Padding was dispensed.  Patient to consider being evaluated by the pedorthist.  RTC prn.   Gardiner Barefoot DPM

## 2019-07-27 ENCOUNTER — Ambulatory Visit (INDEPENDENT_AMBULATORY_CARE_PROVIDER_SITE_OTHER): Payer: Medicare Other | Admitting: Orthotics

## 2019-07-27 ENCOUNTER — Other Ambulatory Visit: Payer: Self-pay

## 2019-07-27 DIAGNOSIS — Q828 Other specified congenital malformations of skin: Secondary | ICD-10-CM

## 2019-07-27 DIAGNOSIS — M778 Other enthesopathies, not elsewhere classified: Secondary | ICD-10-CM

## 2019-07-27 DIAGNOSIS — M779 Enthesopathy, unspecified: Secondary | ICD-10-CM | POA: Diagnosis not present

## 2019-07-27 NOTE — Progress Notes (Signed)
Patient was seen today for offloading painful plantar fibromas/keratomas.  Area of concerned was marked and patient was scanned/cast to offload the keratoma/fibroma.  A LW accomodative device will be fabricated for the patient with appropriate offloads.  

## 2019-08-17 ENCOUNTER — Other Ambulatory Visit: Payer: Self-pay

## 2019-08-17 ENCOUNTER — Ambulatory Visit: Payer: Medicare Other | Admitting: Orthotics

## 2019-08-17 DIAGNOSIS — M778 Other enthesopathies, not elsewhere classified: Secondary | ICD-10-CM

## 2019-08-17 DIAGNOSIS — G5761 Lesion of plantar nerve, right lower limb: Secondary | ICD-10-CM

## 2019-08-17 DIAGNOSIS — Q828 Other specified congenital malformations of skin: Secondary | ICD-10-CM

## 2019-08-17 NOTE — Progress Notes (Signed)
Patient came in today to pick up custom made foot orthotics.  The goals were accomplished and the patient reported no dissatisfaction with said orthotics.  Patient was advised of breakin period and how to report any issues. 

## 2019-09-23 ENCOUNTER — Ambulatory Visit: Payer: Medicare Other | Admitting: Podiatry

## 2019-09-25 DIAGNOSIS — H0102A Squamous blepharitis right eye, upper and lower eyelids: Secondary | ICD-10-CM | POA: Diagnosis not present

## 2019-09-25 DIAGNOSIS — H04123 Dry eye syndrome of bilateral lacrimal glands: Secondary | ICD-10-CM | POA: Diagnosis not present

## 2019-09-25 DIAGNOSIS — H0102B Squamous blepharitis left eye, upper and lower eyelids: Secondary | ICD-10-CM | POA: Diagnosis not present

## 2019-09-25 DIAGNOSIS — S0501XA Injury of conjunctiva and corneal abrasion without foreign body, right eye, initial encounter: Secondary | ICD-10-CM | POA: Diagnosis not present

## 2019-09-29 ENCOUNTER — Ambulatory Visit: Payer: Medicare Other | Admitting: Podiatry

## 2019-09-29 DIAGNOSIS — H0102A Squamous blepharitis right eye, upper and lower eyelids: Secondary | ICD-10-CM | POA: Diagnosis not present

## 2019-09-29 DIAGNOSIS — H0102B Squamous blepharitis left eye, upper and lower eyelids: Secondary | ICD-10-CM | POA: Diagnosis not present

## 2019-09-29 DIAGNOSIS — H04123 Dry eye syndrome of bilateral lacrimal glands: Secondary | ICD-10-CM | POA: Diagnosis not present

## 2019-10-01 ENCOUNTER — Ambulatory Visit: Payer: Medicare Other | Admitting: Podiatry

## 2019-10-05 ENCOUNTER — Ambulatory Visit (INDEPENDENT_AMBULATORY_CARE_PROVIDER_SITE_OTHER): Payer: Medicare Other | Admitting: Podiatry

## 2019-10-05 ENCOUNTER — Other Ambulatory Visit: Payer: Self-pay

## 2019-10-05 ENCOUNTER — Encounter: Payer: Self-pay | Admitting: Podiatry

## 2019-10-05 DIAGNOSIS — Q828 Other specified congenital malformations of skin: Secondary | ICD-10-CM

## 2019-10-05 DIAGNOSIS — M778 Other enthesopathies, not elsewhere classified: Secondary | ICD-10-CM | POA: Diagnosis not present

## 2019-10-05 NOTE — Progress Notes (Signed)
This patient presents the office with chief complaint of a painful callus on her left forefoot.  She says this callus is painful now that she has returned to work and is standing and walking at work.    She says she has had no success with orthoses from  [place].  She says the pad that I have dispensed for her left forefoot pain has been very helpful.  She says her left forefoot has again become painful.  She presents to the office for her left forefoot callus and left forefoot pain.  Vascular  Dorsalis pedis and posterior tibial pulses are palpable  B/L.  Capillary return  WNL.  Temperature gradient is  WNL.  Skin turgor  WNL  Sensorium  Senn Weinstein monofilament wire  WNL. Normal tactile sensation.  Nail Exam  Patient has normal nails with no evidence of bacterial or fungal infection.  Orthopedic  Exam  Muscle tone and muscle strength  WNL.  No limitations of motion feet  B/L.  No crepitus or joint effusion noted.  Foot type is unremarkable and digits show no abnormalities.  Bony prominences are unremarkable. Hammer toes 2,3 left foot. Palpable pain sub 3rd MPJ left foot.  Skin  No open lesions.  Normal skin texture and turgor.  Porokeratosis sub 2,3 left forefoot.    Pain due to porokeratosis left forefoot. Capsulitis 3rd MPJ left.    ROV  Debride porokeratosis left foot.  Padding was dispensed per patient request.  Injection therapy left foot.  Injection therapy using 1.0 cc. Of 2% xylocaine( 20 mg.) plus 1 cc. of kenalog-la ( 10 mg) plus 1/2 cc. of dexamethazone phosphate ( 2 mg) RTC  9  weeks.   Gardiner Barefoot DPM

## 2019-10-13 DIAGNOSIS — K219 Gastro-esophageal reflux disease without esophagitis: Secondary | ICD-10-CM | POA: Diagnosis not present

## 2019-10-13 DIAGNOSIS — J309 Allergic rhinitis, unspecified: Secondary | ICD-10-CM | POA: Diagnosis not present

## 2019-10-13 DIAGNOSIS — Z8673 Personal history of transient ischemic attack (TIA), and cerebral infarction without residual deficits: Secondary | ICD-10-CM | POA: Diagnosis not present

## 2019-10-13 DIAGNOSIS — E559 Vitamin D deficiency, unspecified: Secondary | ICD-10-CM | POA: Diagnosis not present

## 2019-10-13 DIAGNOSIS — E7800 Pure hypercholesterolemia, unspecified: Secondary | ICD-10-CM | POA: Diagnosis not present

## 2019-10-13 DIAGNOSIS — Z853 Personal history of malignant neoplasm of breast: Secondary | ICD-10-CM | POA: Diagnosis not present

## 2019-10-13 DIAGNOSIS — N1831 Chronic kidney disease, stage 3a: Secondary | ICD-10-CM | POA: Diagnosis not present

## 2019-11-05 DIAGNOSIS — Z1159 Encounter for screening for other viral diseases: Secondary | ICD-10-CM | POA: Diagnosis not present

## 2019-11-10 DIAGNOSIS — K648 Other hemorrhoids: Secondary | ICD-10-CM | POA: Diagnosis not present

## 2019-11-10 DIAGNOSIS — K5730 Diverticulosis of large intestine without perforation or abscess without bleeding: Secondary | ICD-10-CM | POA: Diagnosis not present

## 2019-11-10 DIAGNOSIS — D123 Benign neoplasm of transverse colon: Secondary | ICD-10-CM | POA: Diagnosis not present

## 2019-11-10 DIAGNOSIS — Z86010 Personal history of colonic polyps: Secondary | ICD-10-CM | POA: Diagnosis not present

## 2019-11-10 DIAGNOSIS — D122 Benign neoplasm of ascending colon: Secondary | ICD-10-CM | POA: Diagnosis not present

## 2019-11-13 DIAGNOSIS — D123 Benign neoplasm of transverse colon: Secondary | ICD-10-CM | POA: Diagnosis not present

## 2019-11-13 DIAGNOSIS — D122 Benign neoplasm of ascending colon: Secondary | ICD-10-CM | POA: Diagnosis not present

## 2020-01-20 ENCOUNTER — Telehealth: Payer: Self-pay | Admitting: Podiatry

## 2020-01-20 NOTE — Telephone Encounter (Signed)
Left voicemail letting Mr. Kottler know his wife will need to schedule an appointment to see Dr. Milinda Pointer for her to be evaluated to see if she would need surgery. Asked for either him or his wife to call and schedule an appointment for her as she has not been seen in our office since 10/05/19.

## 2020-01-20 NOTE — Telephone Encounter (Signed)
Left voicemail letting pt know she would need to call and schedule an appointment with Dr. Milinda Pointer to be seen as she has not been seen in our office since 10/05/19 with Dr. Prudence Davidson. Told her if Dr. Milinda Pointer felt at the appointment she would require surgery they would start the process then. Also told pt I would call and tell her husband the same thing.

## 2020-01-20 NOTE — Telephone Encounter (Signed)
I'm calling on behalf of my wife Yvonne Jordan. She is wanting to know if she needs surgery on her foot again with Dr. Milinda Pointer. Please call me back or you can contact her. Thank you very much.

## 2020-01-26 ENCOUNTER — Encounter: Payer: Self-pay | Admitting: Podiatry

## 2020-01-26 ENCOUNTER — Other Ambulatory Visit: Payer: Self-pay

## 2020-01-26 ENCOUNTER — Ambulatory Visit (INDEPENDENT_AMBULATORY_CARE_PROVIDER_SITE_OTHER): Payer: Medicare Other | Admitting: Podiatry

## 2020-01-26 DIAGNOSIS — M778 Other enthesopathies, not elsewhere classified: Secondary | ICD-10-CM | POA: Diagnosis not present

## 2020-01-26 DIAGNOSIS — Q828 Other specified congenital malformations of skin: Secondary | ICD-10-CM

## 2020-01-26 DIAGNOSIS — D689 Coagulation defect, unspecified: Secondary | ICD-10-CM | POA: Diagnosis not present

## 2020-01-26 NOTE — Progress Notes (Signed)
She presents today chief complaint of painful calluses plantar aspect of the forefoot bilaterally she is also complaining of painful bunion deformity with tenderness along the medial aspect as she points to the medial aspect of the first metatarsophalangeal joint left foot.  Objective: Vital signs are stable she is alert and oriented x3 there is no erythema edema cellulitis drainage or odor tenderness on palpation the medial aspect of the first MTPJ with some boggy fluctuance beneath the skin.  Porokeratotic lesions subsecond metatarsophalangeal joint right subthird left.  No open lesions or wounds.  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  Plan: Discussed etiology pathology conservative surgical therapies at this point went ahead and performed a chemical destruction of lesion after mechanical of the nucleation.  I also went ahead and injected the bursa today with 2 mg of dexamethasone and local anesthetic to the medial aspect of the first metatarsophalangeal joint.  This alleviated her symptoms immediately.  Follow-up with her in the near future for reevaluation.

## 2020-02-04 ENCOUNTER — Telehealth: Payer: Self-pay | Admitting: *Deleted

## 2020-02-04 NOTE — Telephone Encounter (Signed)
02/04/20 LMOM RE: FOLLOW UP Yvonne Jordan

## 2020-02-06 ENCOUNTER — Ambulatory Visit: Payer: Medicare Other | Attending: Internal Medicine

## 2020-02-06 DIAGNOSIS — Z23 Encounter for immunization: Secondary | ICD-10-CM | POA: Insufficient documentation

## 2020-02-06 NOTE — Progress Notes (Signed)
   Covid-19 Vaccination Clinic  Name:  Yvonne Jordan    MRN: [id number] DOB: 1946-10-29  02/06/2020  Yvonne Jordan was observed post Covid-19 immunization for 15 minutes without incidence. She was provided with Vaccine Information Sheet and instruction to access the V-Safe system.   Yvonne Jordan was instructed to call 911 with any severe reactions post vaccine: Marland Kitchen Difficulty breathing  . Swelling of your face and throat  . A fast heartbeat  . A bad rash all over your body  . Dizziness and weakness    Immunizations Administered    Name Date Dose VIS Date Route   Pfizer COVID-19 Vaccine 02/06/2020  8:20 AM 0.3 mL 12/04/2019 Intramuscular   Manufacturer: [place]   Lot: [id number]   [place]: [id number]

## 2020-02-18 ENCOUNTER — Other Ambulatory Visit: Payer: Self-pay | Admitting: Family Medicine

## 2020-02-18 DIAGNOSIS — Z1231 Encounter for screening mammogram for malignant neoplasm of breast: Secondary | ICD-10-CM

## 2020-02-25 ENCOUNTER — Ambulatory Visit (INDEPENDENT_AMBULATORY_CARE_PROVIDER_SITE_OTHER): Payer: Medicare Other | Admitting: Cardiology

## 2020-02-25 ENCOUNTER — Encounter: Payer: Self-pay | Admitting: Cardiology

## 2020-02-25 ENCOUNTER — Other Ambulatory Visit: Payer: Self-pay

## 2020-02-25 VITALS — BP 140/78 | HR 75 | Temp 97.2°F | Ht 65.0 in | Wt 181.4 lb

## 2020-02-25 DIAGNOSIS — R0602 Shortness of breath: Secondary | ICD-10-CM | POA: Diagnosis not present

## 2020-02-25 DIAGNOSIS — Z7189 Other specified counseling: Secondary | ICD-10-CM | POA: Diagnosis not present

## 2020-02-25 DIAGNOSIS — R002 Palpitations: Secondary | ICD-10-CM

## 2020-02-25 DIAGNOSIS — R030 Elevated blood-pressure reading, without diagnosis of hypertension: Secondary | ICD-10-CM

## 2020-02-25 NOTE — Patient Instructions (Signed)

## 2020-02-25 NOTE — Progress Notes (Signed)
Cardiology Office Note:    Date:  02/25/2020   ID:  Yvonne Jordan, DOB 1946/12/19, MRN [id number]  PCP:  Antony Contras, MD  Cardiologist:  Buford Dresser, MD PhD  Referring MD: Antony Contras, MD   CC: annual follow up  History of Present Illness:    Yvonne Jordan is a 74 y.o. female with a hx of TIA on statin and clopidogrel, hyperlipidemia who is for follow up. I initially saw her 02/2019 as a new consult at the request of Antony Contras, MD for the evaluation and management of chest pain and dyspnea.  Family history: sister died of MI in her 17s, brother had MI, mother had MI, many members with strokes (mom, dad both).   Today: Has done well this last year. No more chest pain but breathing remains troublesome with stooping/standing. This has been going on for a long time and has not changed. Only very rarely at rest, usually with bending or significant exertion.  No additional neuro issues since we last met. Tolerating plavix and statin.  Got first Covid vaccine, due for second dose next week.   On no blood pressure medications, BP borderline today. Recommend she follow up with Dr. [name] at her appointment next month.  Denies chest pain. No PND, orthopnea, LE edema or unexpected weight gain. No syncope in several years. Rare palpitations.  Past Medical History:  Diagnosis Date  . Allergic rhinitis   . Cancer (HCC)    Breast  . CKD (chronic kidney disease)   . Colon polyp   . Hyperlipidemia   . Personal history of radiation therapy   . TIA (transient ischemic attack)   . Vitamin D deficiency     Past Surgical History:  Procedure Laterality Date  . BREAST LUMPECTOMY Left 2006  . BREAST SURGERY     2006...treated with Tamoxifen for 5 years  . CHOLECYSTECTOMY      Current Medications: Current Outpatient Medications on File Prior to Visit  Medication Sig  . Ascorbic Acid (VITAMIN C) 1000 MG tablet Take 1,000 mg by mouth daily.  Marland Kitchen atorvastatin  (LIPITOR) 20 MG tablet   . Cholecalciferol (VITAMIN D3) 2000 UNITS TABS Take by mouth.  . clopidogrel (PLAVIX) 75 MG tablet Take 75 mg by mouth daily with breakfast.  . Multiple Vitamin (MULTIVITAMIN) capsule Take 1 capsule by mouth daily.  . pantoprazole (PROTONIX) 40 MG tablet Take 1 tablet (40 mg total) by mouth 2 (two) times daily before a meal. For 30 days   No current facility-administered medications on file prior to visit.     Allergies:   Other   Social History   Tobacco Use  . Smoking status: Never Smoker  . Smokeless tobacco: Never Used  Substance Use Topics  . Alcohol use: Yes    Alcohol/week: 0.0 standard drinks    Comment: 1 drink per wk  . Drug use: No    Family History: The patient's family history includes Breast cancer in her sister; Deep vein thrombosis in her mother; Heart attack in her sister; Heart disease in her father, maternal uncle, maternal uncle, and maternal uncle; Stroke in her father and mother.  ROS:   Please see the history of present illness.  Additional pertinent ROS otherwise unremarkable.  EKGs/Labs/Other Studies Reviewed:    The following studies were reviewed today: Prior notes and stress test results  EKG:  EKG is personally reviewed.  The ekg ordered today demonstrates normal sinus rhythm  Recent Labs: 05/07/2019: Creatinine, Ser 1.10  Recent Lipid Panel No results found for: CHOL, TRIG, HDL, CHOLHDL, VLDL, LDLCALC, LDLDIRECT  Physical Exam:    VS:  BP 140/78   Pulse 75   Temp (!) 97.2 F (36.2 C)   Ht 5' 5"  (1.651 m)   Wt 181 lb 6.4 oz (82.3 kg)   SpO2 97%   BMI 30.19 kg/m     Wt Readings from Last 3 Encounters:  02/25/20 181 lb 6.4 oz (82.3 kg)  05/01/19 184 lb (83.5 kg)  03/04/19 184 lb 6.4 oz (83.6 kg)    GEN: Well nourished, well developed in no acute distress HEENT: Normal, moist mucous membranes NECK: No JVD CARDIAC: regular rhythm, normal S1 and S2, no rubs or gallops. No murmur. VASCULAR: Radial and DP  pulses 2+ bilaterally. No carotid bruits RESPIRATORY:  Clear to auscultation without rales, wheezing or rhonchi  ABDOMEN: Soft, non-tender, non-distended MUSCULOSKELETAL:  Ambulates independently SKIN: Warm and dry, no edema NEUROLOGIC:  Alert and oriented x 3. No focal neuro deficits noted. PSYCHIATRIC:  Normal affect   ASSESSMENT:    1. Shortness of breath   2. Palpitation   3. Elevated blood pressure reading   4. Cardiac risk counseling   5. Counseling on health promotion and disease prevention    PLAN:    Chest pain: resolved -coronary CTA without CAD, calcium score 0  Shortness of breath: chronic, stable. Worse with bending over. Rare with significant exertion -discussed options. She feels it is related to her breathing. She will call if this worsens, and then we will get echo. Normal exam today.  Elevated blood pressure reading: slightly elevated today. With TIA history, goal <130/80 -on no meds -she has an appt next month with Dr. [name]. If >140/90 at that time, would start treatment  Palpitations: chronic, rare, not bothersome. Monitor.  History of TIA: on clopidogrel and statin. Continue  Cardiac risk counseling and prevention recommendations: -recommend heart healthy/Mediterranean diet, with whole grains, fruits, vegetable, fish, lean meats, nuts, and olive oil. Limit salt. We spoke in detail about the mediterranean diet today. -recommend moderate walking, 3-5 times/week for 30-50 minutes each session. Aim for at least 150 minutes.week. Goal should be pace of 3 miles/hours, or walking 1.5 miles in 30 minutes -recommend avoidance of tobacco products. Avoid excess alcohol.  Plan for follow up: 1 year or sooner as needed  Medication Adjustments/Labs and Tests Ordered: Current medicines are reviewed at length with the patient today.  Concerns regarding medicines are outlined above.  Orders Placed This Encounter  Procedures  . EKG 12-Lead   No orders of the defined  types were placed in this encounter.   Patient Instructions  Medication Instructions:  Your Physician recommend you continue on your current medication as directed.    *If you need a refill on your cardiac medications before your next appointment, please call your pharmacy*   Lab Work: None   Testing/Procedures: None   Follow-Up: At [facility], you and your health needs are our priority.  As part of our continuing mission to provide you with exceptional heart care, we have created designated Provider Care Teams.  These Care Teams include your primary Cardiologist (physician) and Advanced Practice Providers (APPs -  Physician Assistants and Nurse Practitioners) who all work together to provide you with the care you need, when you need it.  We recommend signing up for the patient portal called "MyChart".  Sign up information is provided on this After Visit Summary.  MyChart is used to connect with  patients for Virtual Visits (Telemedicine).  Patients are able to view lab/test results, encounter notes, upcoming appointments, etc.  Non-urgent messages can be sent to your provider as well.   To learn more about what you can do with MyChart, go to [email or web address].    Your next appointment:   1 year(s)  The format for your next appointment:   In Person  Provider:   Buford Dresser, MD        Signed, Buford Dresser, MD PhD 02/25/2020 2:19 PM    [place]

## 2020-02-29 ENCOUNTER — Ambulatory Visit: Payer: Medicare Other | Attending: Internal Medicine

## 2020-02-29 DIAGNOSIS — Z23 Encounter for immunization: Secondary | ICD-10-CM

## 2020-02-29 NOTE — Progress Notes (Signed)
   Covid-19 Vaccination Clinic  Name:  Yvonne Jordan    MRN: [id number] DOB: 01-06-1946  02/29/2020  [name] was observed post Covid-19 immunization for 15 minutes without incident. She was provided with Vaccine Information Sheet and instruction to access the V-Safe system.   [name] was instructed to call 911 with any severe reactions post vaccine: Marland Kitchen Difficulty breathing  . Swelling of face and throat  . A fast heartbeat  . A bad rash all over body  . Dizziness and weakness   Immunizations Administered    Name Date Dose VIS Date Route   Pfizer COVID-19 Vaccine 02/29/2020  9:25 AM 0.3 mL 12/04/2019 Intramuscular   Manufacturer: [place]   Lot: [id number]   [place]: [id number]

## 2020-04-07 ENCOUNTER — Other Ambulatory Visit: Payer: Self-pay

## 2020-04-07 ENCOUNTER — Ambulatory Visit (INDEPENDENT_AMBULATORY_CARE_PROVIDER_SITE_OTHER): Payer: Medicare Other | Admitting: Podiatry

## 2020-04-07 DIAGNOSIS — Q828 Other specified congenital malformations of skin: Secondary | ICD-10-CM

## 2020-04-07 DIAGNOSIS — D689 Coagulation defect, unspecified: Secondary | ICD-10-CM

## 2020-04-07 NOTE — Progress Notes (Signed)
Presents today for follow-up of poor keratomas bilaterally.  Objective: No open lesions or wounds are noted.  No porokeratotic lesions are noted.  Pulses are palpable.  No erythema cellulitis drainage or odor.  Assessment: No porokeratosis.  Plan: Follow-up with me in 6 months

## 2020-04-11 DIAGNOSIS — Z1211 Encounter for screening for malignant neoplasm of colon: Secondary | ICD-10-CM | POA: Diagnosis not present

## 2020-04-11 DIAGNOSIS — N1831 Chronic kidney disease, stage 3a: Secondary | ICD-10-CM | POA: Diagnosis not present

## 2020-04-11 DIAGNOSIS — Z1389 Encounter for screening for other disorder: Secondary | ICD-10-CM | POA: Diagnosis not present

## 2020-04-11 DIAGNOSIS — Z8673 Personal history of transient ischemic attack (TIA), and cerebral infarction without residual deficits: Secondary | ICD-10-CM | POA: Diagnosis not present

## 2020-04-11 DIAGNOSIS — E7800 Pure hypercholesterolemia, unspecified: Secondary | ICD-10-CM | POA: Diagnosis not present

## 2020-04-11 DIAGNOSIS — R0600 Dyspnea, unspecified: Secondary | ICD-10-CM | POA: Diagnosis not present

## 2020-04-11 DIAGNOSIS — K219 Gastro-esophageal reflux disease without esophagitis: Secondary | ICD-10-CM | POA: Diagnosis not present

## 2020-04-11 DIAGNOSIS — Z0000 Encounter for general adult medical examination without abnormal findings: Secondary | ICD-10-CM | POA: Diagnosis not present

## 2020-04-11 DIAGNOSIS — M85851 Other specified disorders of bone density and structure, right thigh: Secondary | ICD-10-CM | POA: Diagnosis not present

## 2020-04-11 DIAGNOSIS — Z853 Personal history of malignant neoplasm of breast: Secondary | ICD-10-CM | POA: Diagnosis not present

## 2020-04-11 DIAGNOSIS — J309 Allergic rhinitis, unspecified: Secondary | ICD-10-CM | POA: Diagnosis not present

## 2020-04-11 DIAGNOSIS — E559 Vitamin D deficiency, unspecified: Secondary | ICD-10-CM | POA: Diagnosis not present

## 2020-04-12 ENCOUNTER — Ambulatory Visit
Admission: RE | Admit: 2020-04-12 | Discharge: 2020-04-12 | Disposition: A | Discharge status: Home / Self Care | Payer: Medicare Other | Source: Ambulatory Visit | Attending: Family Medicine | Admitting: Family Medicine

## 2020-04-12 ENCOUNTER — Other Ambulatory Visit: Payer: Self-pay

## 2020-04-12 DIAGNOSIS — Z1231 Encounter for screening mammogram for malignant neoplasm of breast: Secondary | ICD-10-CM

## 2020-04-12 IMAGING — MG DIGITAL SCREENING BILAT W/ TOMO W/ CAD
8 series · 9 of 24 positions shown · non-contrast
Comparison: Previous exams.

CLINICAL DATA: Screening. History of left lumpectomy [4T].

EXAM:
DIGITAL SCREENING BILATERAL MAMMOGRAM WITH TOMO AND CAD

[R MLO synth-2D]
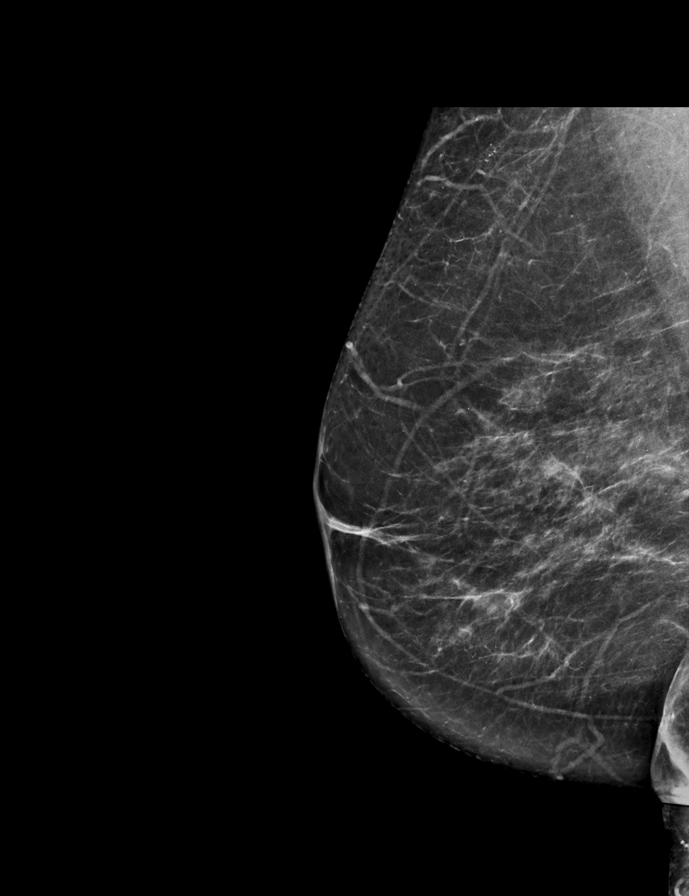

[L CC synth-2D]
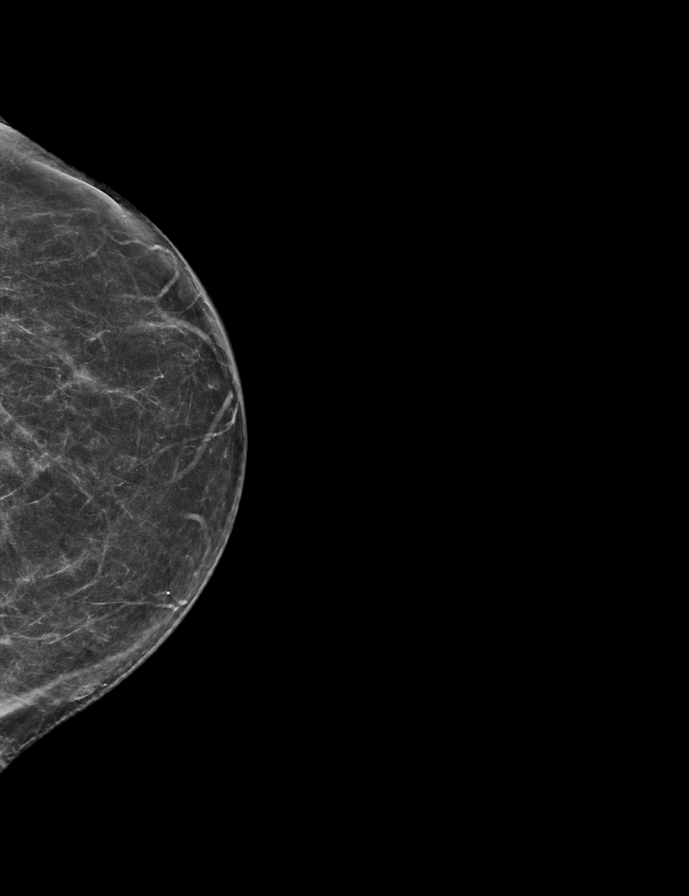

[R CC synth-2D]
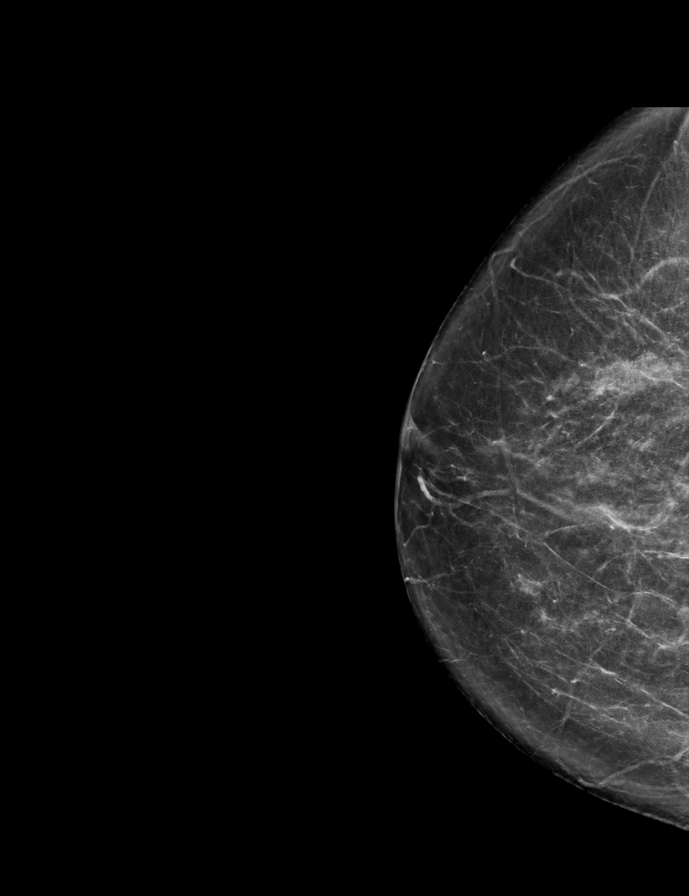

[L MLO synth-2D]
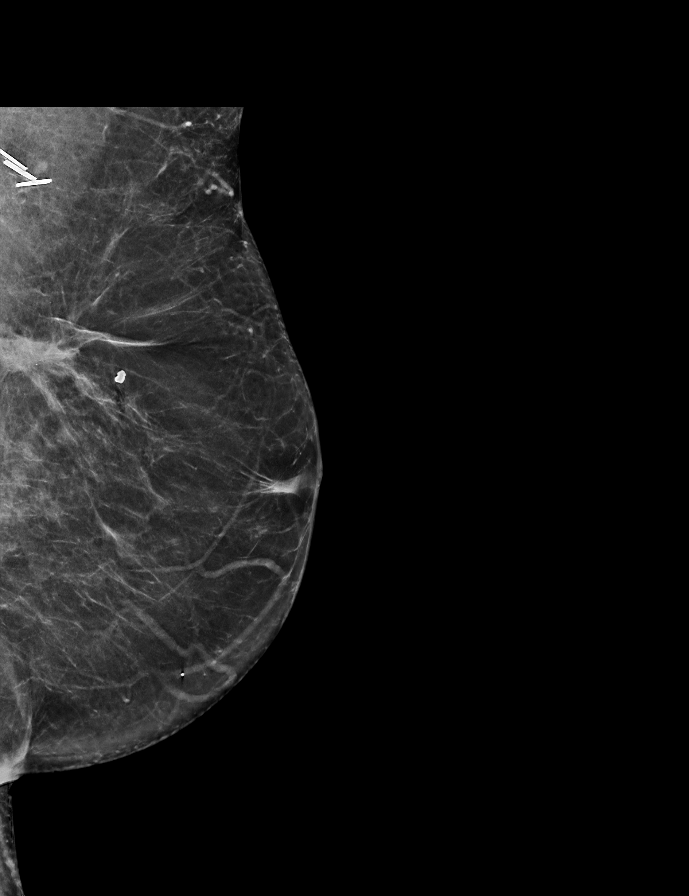

[L MLO tomo · 2 of 64 frames shown]
[frame 21/64]
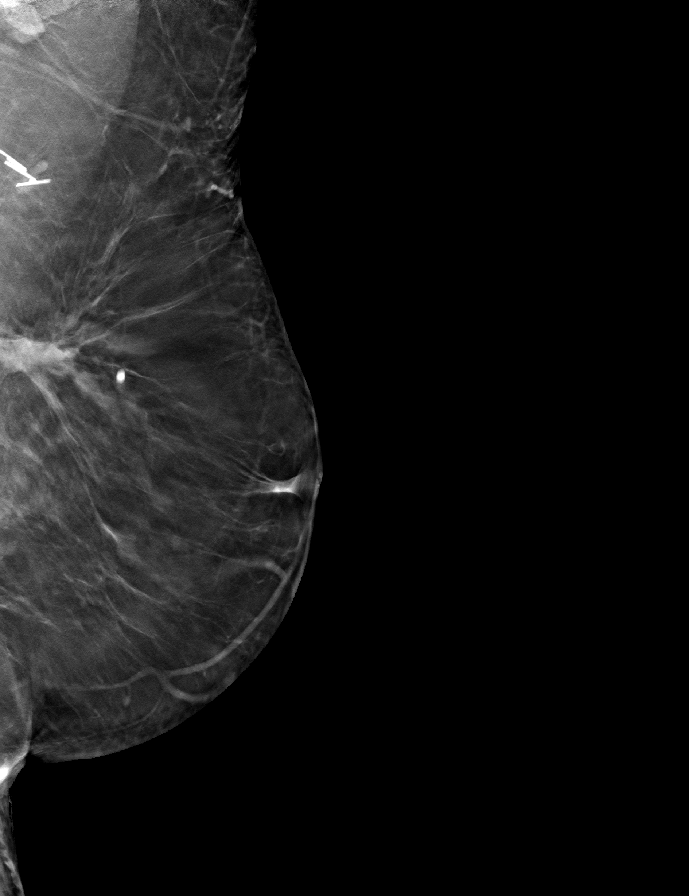
[frame 33/64]
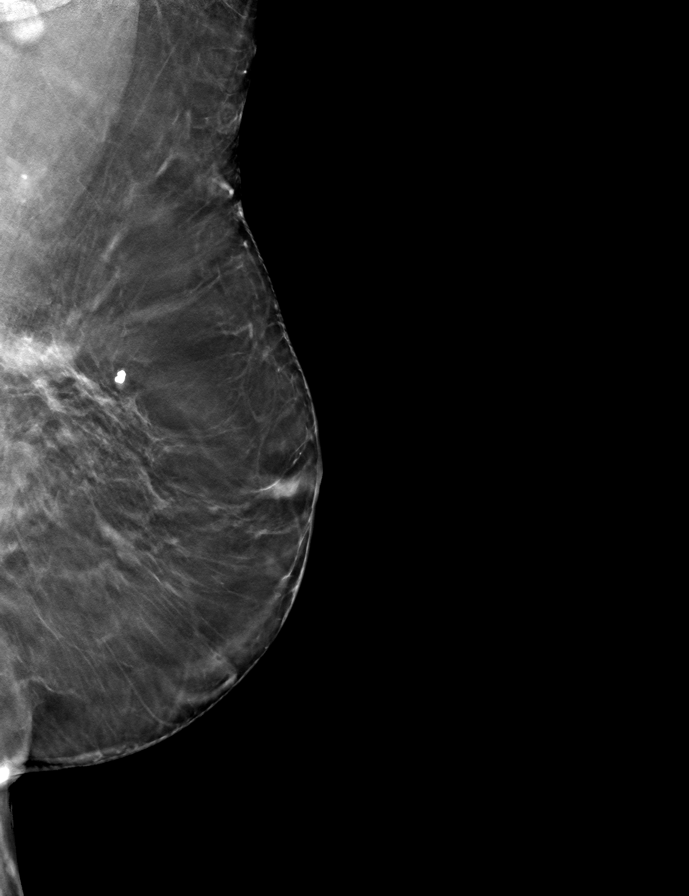

[R CC tomo · tomo slice 33/64.0]
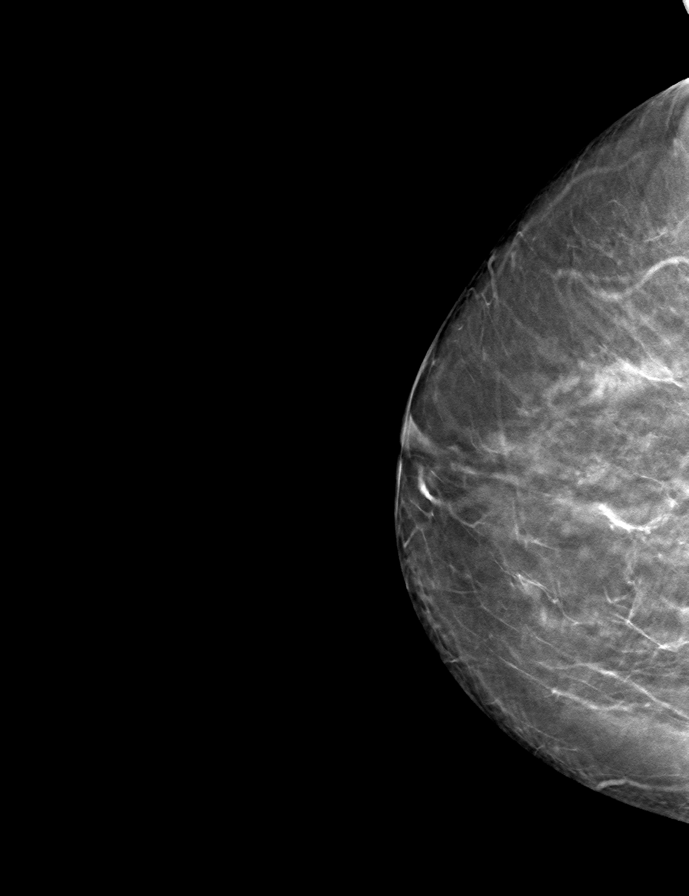

[R MLO tomo · tomo slice 35/70.0]
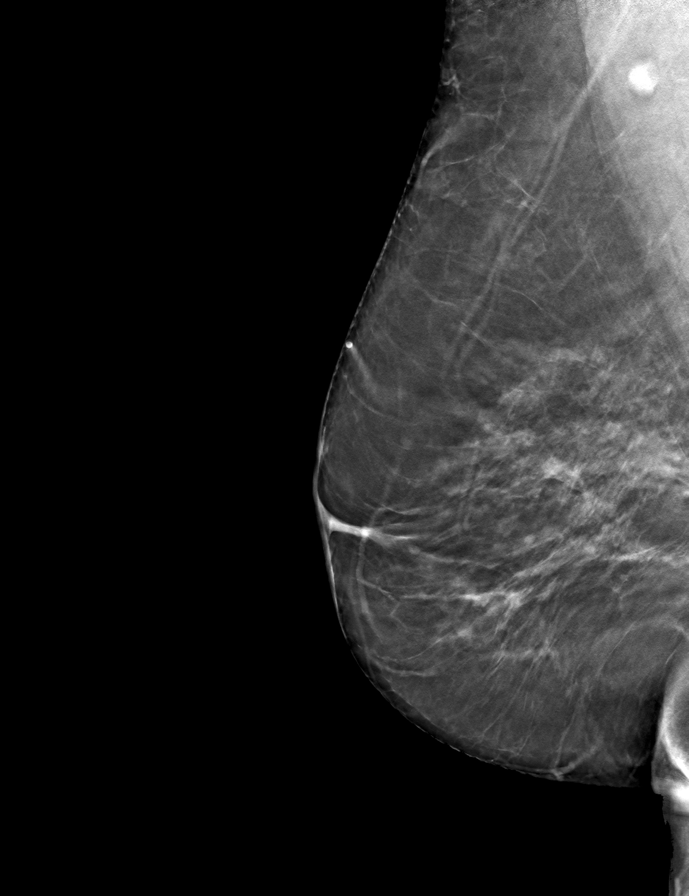

[L CC tomo · tomo slice 31/61.0]
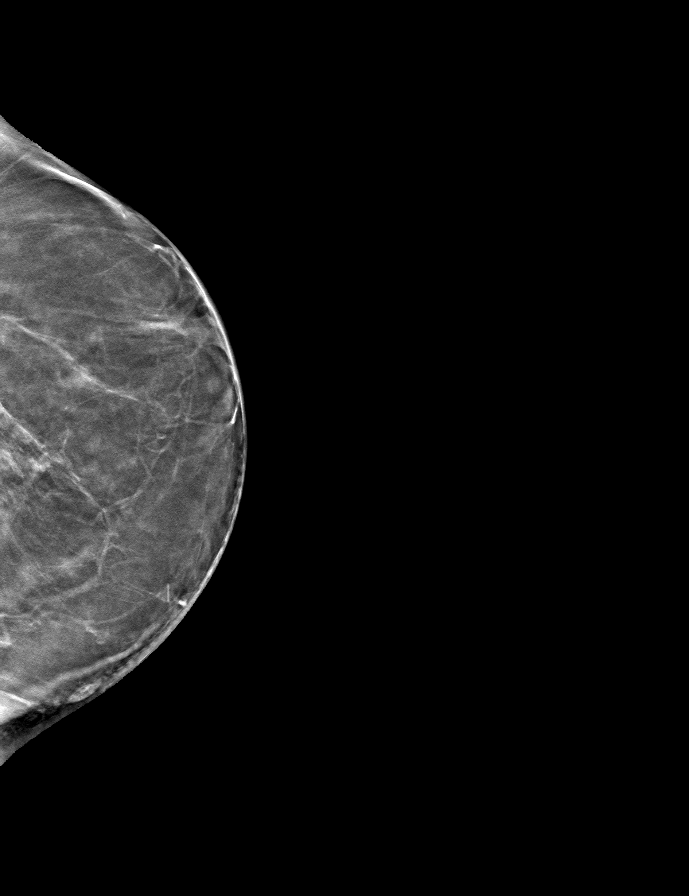

[9 of 24 positions shown; findings below may reference images not displayed]

ACR Breast Density Category b: There are scattered areas of
fibroglandular density.
FINDINGS: In the right breast an asymmetry  requires further evaluation.

In the left breast an asymmetry requires further evaluation.

Images were processed with CAD.
IMPRESSION: Further evaluation is suggested for possible asymmetry in the right
breast.

Further evaluation is suggested for possible asymmetry in the left
breast.

RECOMMENDATION:
Diagnostic mammogram and possibly ultrasound of both breasts.
(Code:[4T])

The patient will be contacted regarding the findings, and additional
imaging will be scheduled.

BI-RADS CATEGORY  0: Incomplete. Need additional imaging evaluation
and/or prior mammograms for comparison.

## 2020-04-13 ENCOUNTER — Other Ambulatory Visit: Payer: Self-pay | Admitting: Family Medicine

## 2020-04-13 DIAGNOSIS — R928 Other abnormal and inconclusive findings on diagnostic imaging of breast: Secondary | ICD-10-CM

## 2020-04-15 ENCOUNTER — Other Ambulatory Visit: Payer: Self-pay | Admitting: Family Medicine

## 2020-04-15 DIAGNOSIS — M8580 Other specified disorders of bone density and structure, unspecified site: Secondary | ICD-10-CM

## 2020-04-25 ENCOUNTER — Ambulatory Visit: Payer: Medicare Other

## 2020-04-25 ENCOUNTER — Ambulatory Visit
Admission: RE | Admit: 2020-04-25 | Discharge: 2020-04-25 | Disposition: A | Discharge status: Home / Self Care | Payer: Medicare Other | Source: Ambulatory Visit | Attending: Family Medicine | Admitting: Family Medicine

## 2020-04-25 ENCOUNTER — Other Ambulatory Visit: Payer: Self-pay

## 2020-04-25 DIAGNOSIS — R928 Other abnormal and inconclusive findings on diagnostic imaging of breast: Secondary | ICD-10-CM | POA: Diagnosis not present

## 2020-04-25 DIAGNOSIS — Z853 Personal history of malignant neoplasm of breast: Secondary | ICD-10-CM | POA: Diagnosis not present

## 2020-04-25 IMAGING — MG DIGITAL DIAGNOSTIC BILAT W/ TOMO W/ CAD
6 of 10 series · 6 of 30 positions shown · non-contrast
Comparison: Previous exam(s).

CLINICAL DATA: Possible asymmetry in the outer right breast in the
craniocaudal projection of a recent screening mammogram and possible
asymmetry in the posterior upper left breast in the oblique
projection of the recent screening mammogram. Status post left
lumpectomy and radiation therapy for breast cancer in [EC].

EXAM:
DIGITAL DIAGNOSTIC BILATERAL MAMMOGRAM WITH CAD AND TOMO

[R CC synth-2D]
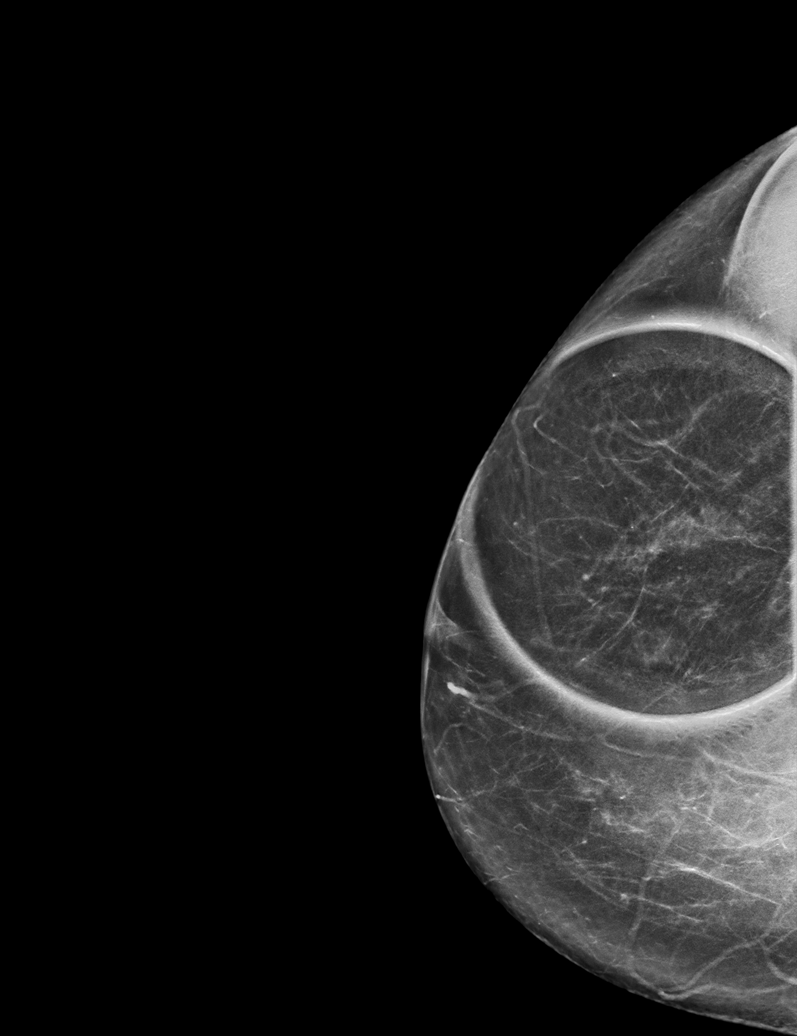

[L MLO synth-2D (1 of 2)]
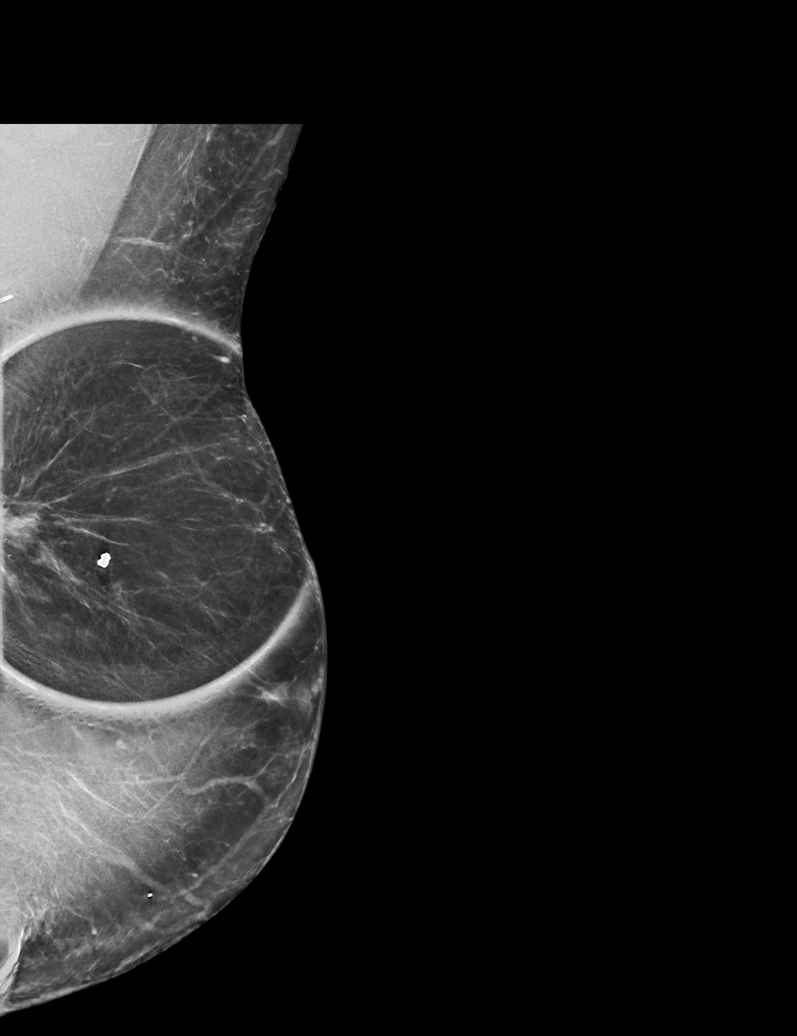

[L ML synth-2D]
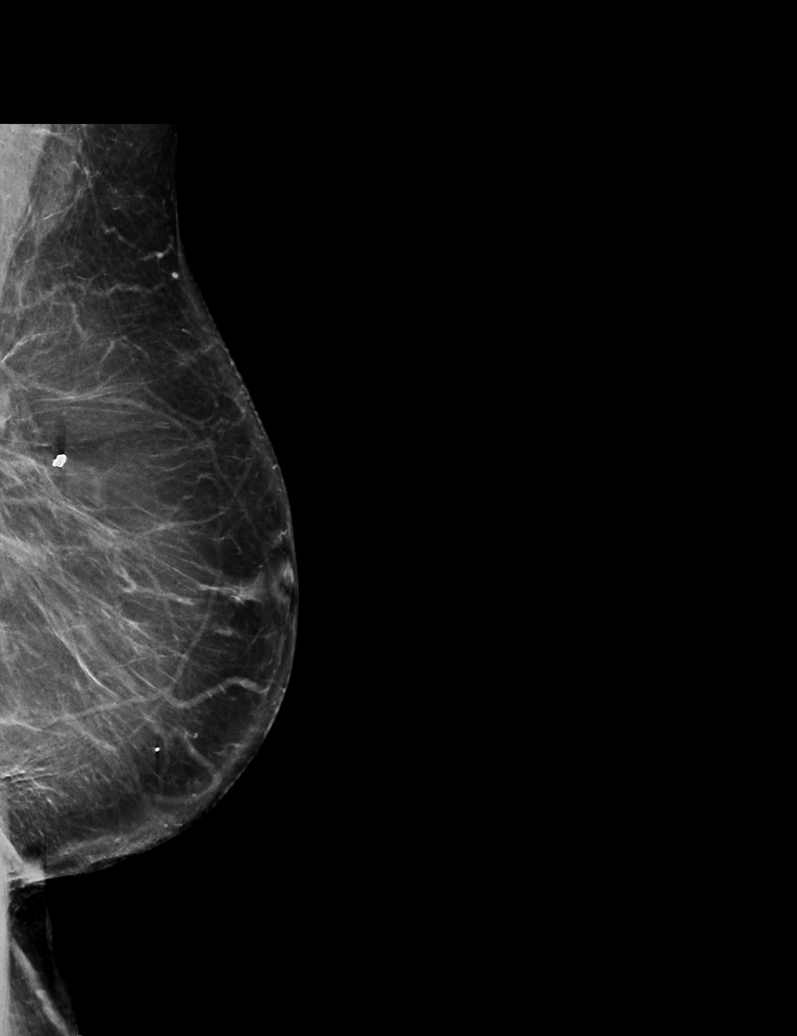

[L MLO synth-2D (2 of 2)]
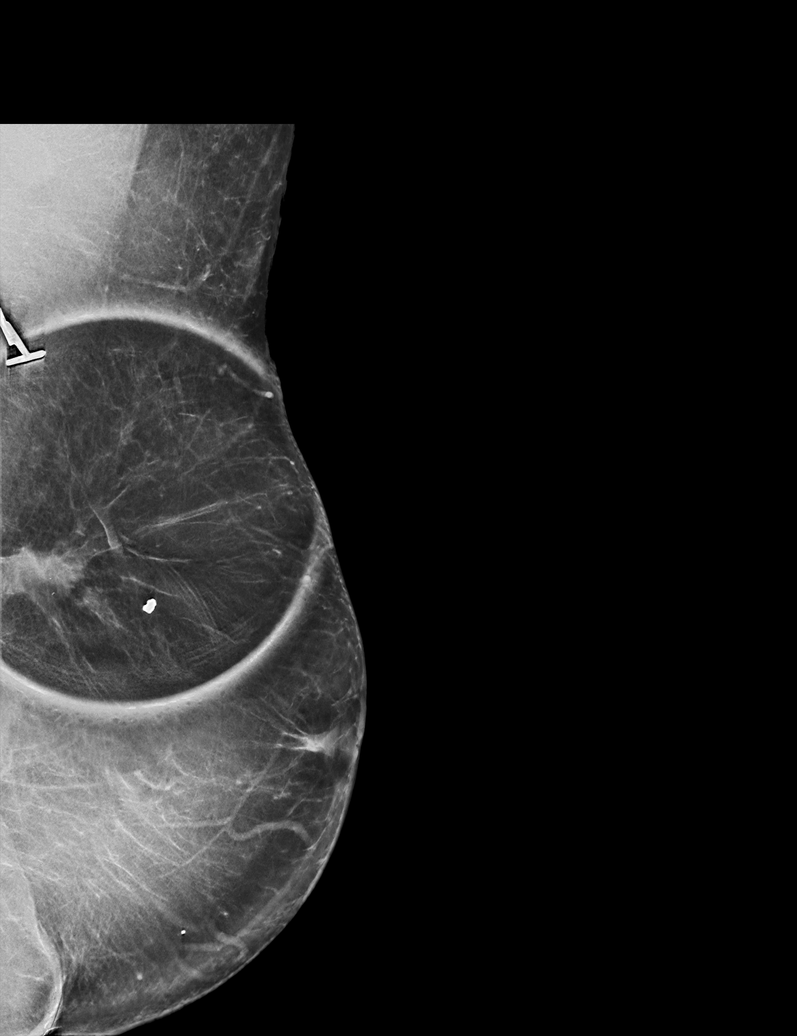

[R ML synth-2D]
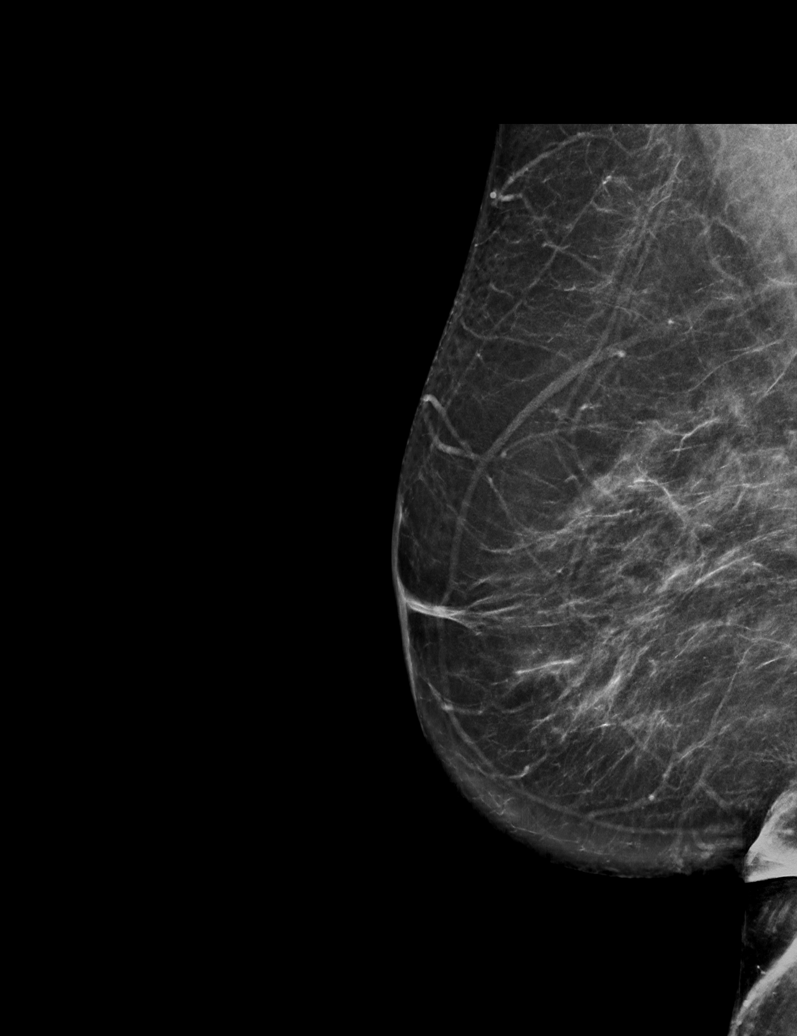

[L MLO tomo · tomo slice 31/60.0]
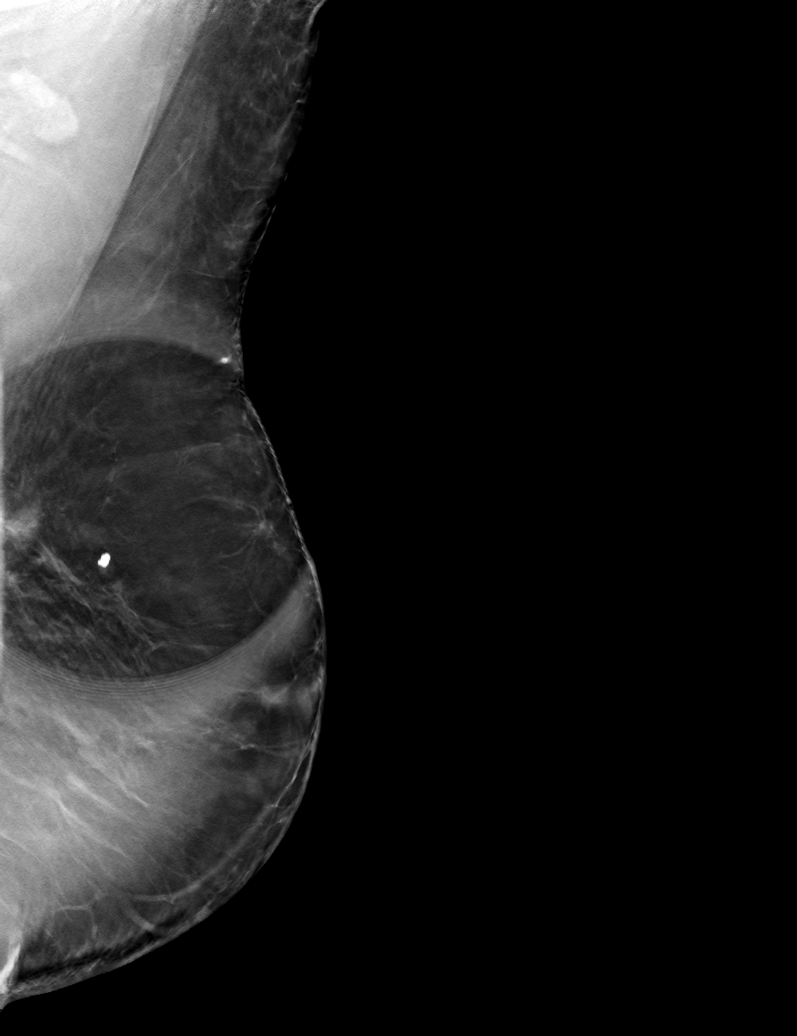

[6 of 30 positions shown; findings below may reference images not displayed]

ACR Breast Density Category b: There are scattered areas of
fibroglandular density.
FINDINGS: 3D tomographic and 2D generated true lateral and spot compression
mammographic views of both breasts were obtained. Normal appearing
fibroglandular tissue is demonstrated in the outer right breast at
the location of previously suspected asymmetry, unchanged compared
previous examinations.

Normal postsurgical scarring is demonstrated in the lumpectomy bed
in the posterior aspect of the upper left breast, unchanged compared
to previous examinations dating back to [DATE]. No interval
asymmetry at that location.

Mammographic images were processed with CAD.
IMPRESSION: No evidence of malignancy. The recently suspected right breast
asymmetry was close apposition of normal fibroglandular tissue and
the recently suspected left breast asymmetry is mammographically
stable normal postsurgical scarring in the patient's lumpectomy bed.

RECOMMENDATION:
Bilateral screening mammogram in 1 year.

I have discussed the findings and recommendations with the patient.
If applicable, a reminder letter will be sent to the patient
regarding the next appointment.

BI-RADS CATEGORY  2: Benign.

## 2020-05-19 ENCOUNTER — Ambulatory Visit (INDEPENDENT_AMBULATORY_CARE_PROVIDER_SITE_OTHER): Payer: Medicare Other | Admitting: Pulmonary Disease

## 2020-05-19 ENCOUNTER — Other Ambulatory Visit: Payer: Self-pay

## 2020-05-19 ENCOUNTER — Encounter: Payer: Self-pay | Admitting: Pulmonary Disease

## 2020-05-19 DIAGNOSIS — R0602 Shortness of breath: Secondary | ICD-10-CM

## 2020-05-19 NOTE — Patient Instructions (Signed)
Please get your lungs evaluated with a high-resolution CT and pulmonary function tests Based on these results we may do further testing or refer you back to GI for evaluation  Follow-up in 1 to 2 months.

## 2020-05-19 NOTE — Progress Notes (Signed)
Yvonne Jordan    [id number]    Aug 11, 1946  Primary Care Physician:Swayne, Shanon Brow, MD  Referring Physician: Antony Contras, MD [address] [place],  [place] [address]  Chief complaint: Consult for dyspnea  HPI: 74 year old with history of CKD, GERD Complains of shortness of breath on exertion, worsened by bending over, chewing or eating.  Denies any cough, sputum production, wheezing  Evaluated by Dr. Melvyn Novas in 2016.  Esophagram in 2015 shows laryngeal penetration with thick barium, no aspiration.  Evaluated by speech therapy in 2015.  She follows with Dr. Paulita Fujita at [facility].  Cardiology evaluation recently with no significant abnormality. She has significant GERD symptoms and is on PPI 1-2 times a day.   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  No mold, hot tub, Jacuzzi.  No down pillows or comforters Smoking history: Never smoker Travel history: Originally from [place].  Previously lived in [place].  No significant recent travel Relevant family history: No significant family history of lung disease  Outpatient Encounter Medications as of 05/19/2020  Medication Sig  . acetaminophen (TYLENOL) 325 MG tablet Take 650 mg by mouth every 6 (six) hours as needed. At night if needed  . atorvastatin (LIPITOR) 20 MG tablet   . Cholecalciferol (VITAMIN D3) 2000 UNITS TABS Take by mouth.  . clopidogrel (PLAVIX) 75 MG tablet Take 75 mg by mouth daily with breakfast.  . Multiple Vitamin (MULTIVITAMIN) capsule Take 1 capsule by mouth daily.  . pantoprazole (PROTONIX) 40 MG tablet Take 1 tablet (40 mg total) by mouth 2 (two) times daily before a meal. For 30 days  . [DISCONTINUED] Ascorbic Acid (VITAMIN C) 1000 MG tablet Take 1,000 mg by mouth daily.   No facility-administered encounter medications on file as of 05/19/2020.    Allergies as of 05/19/2020 - Review Complete 05/19/2020  Allergen Reaction Noted    . Other  02/10/2019    Past Medical History:  Diagnosis Date  . Allergic rhinitis   . Cancer (HCC)    Breast  . CKD (chronic kidney disease)   . Colon polyp   . Hyperlipidemia   . Personal history of radiation therapy   . TIA (transient ischemic attack)   . Vitamin D deficiency     Past Surgical History:  Procedure Laterality Date  . BREAST LUMPECTOMY Left 2006  . BREAST SURGERY     2006...treated with Tamoxifen for 5 years  . CHOLECYSTECTOMY      Family History  Problem Relation Age of Onset  . Breast cancer Sister   . Heart attack Sister   . Heart disease Father   . Stroke Father   . Heart disease Maternal Uncle   . Heart disease Maternal Uncle   . Heart disease Maternal Uncle   . Stroke Mother   . Deep vein thrombosis Mother     Social History   Socioeconomic History  . Marital status: Married    Spouse name: Not on file  . Number of children: Not on file  . Years of education: Not on file  . Highest education level: Not on file  Occupational History  . Occupation: Sales  Tobacco Use  . Smoking status: Never Smoker  . Smokeless tobacco: Never Used  Substance and Sexual Activity  . Alcohol use: Yes    Alcohol/week: 0.0 standard drinks    Comment: 1  drink per wk  . Drug use: No  . Sexual activity: Not on file  Other Topics Concern  . Not on file  Social History Narrative   Drinks about 2 cups of coffee a day    Social Determinants of Health   Financial Resource Strain:   . Difficulty of Paying Living Expenses:   Food Insecurity:   . Worried About Charity fundraiser in the Last Year:   . Arboriculturist in the Last Year:   Transportation Needs:   . Film/video editor (Medical):   Marland Kitchen Lack of Transportation (Non-Medical):   Physical Activity:   . Days of Exercise per Week:   . Minutes of Exercise per Session:   Stress:   . Feeling of Stress :   Social Connections:   . Frequency of Communication with Friends and Family:   . Frequency of  Social Gatherings with Friends and Family:   . Attends Religious Services:   . Active Member of Clubs or Organizations:   . Attends Archivist Meetings:   Marland Kitchen Marital Status:   Intimate Partner Violence:   . Fear of Current or Ex-Partner:   . Emotionally Abused:   Marland Kitchen Physically Abused:   . Sexually Abused:     Review of systems: Review of Systems  Constitutional: Negative for fever and chills.  HENT: Negative.   Eyes: Negative for blurred vision.  Respiratory: as per HPI  Cardiovascular: Negative for chest pain and palpitations.  Gastrointestinal: Negative for vomiting, diarrhea, blood per rectum. Genitourinary: Negative for dysuria, urgency, frequency and hematuria.  Musculoskeletal: Negative for myalgias, back pain and joint pain.  Skin: Negative for itching and rash.  Neurological: Negative for dizziness, tremors, focal weakness, seizures and loss of consciousness.  Endo/Heme/Allergies: Negative for environmental allergies.  Psychiatric/Behavioral: Negative for depression, suicidal ideas and hallucinations.  All other systems reviewed and are negative.  Physical Exam: Blood pressure 118/66, pulse 95, temperature 98.5 F (36.9 C), temperature source Oral, height 5\' 5"  (1.651 m), weight 187 lb 12.8 oz (85.2 kg), SpO2 99 %. Gen:      No acute distress HEENT:  EOMI, sclera anicteric Neck:     No masses; no thyromegaly Lungs:    Clear to auscultation bilaterally; normal respiratory effort CV:         Regular rate and rhythm; no murmurs Abd:      + bowel sounds; soft, non-tender; no palpable masses, no distension Ext:    No edema; adequate peripheral perfusion Skin:      Warm and dry; no rash Neuro: alert and oriented x 3 Psych: normal mood and affect  Data Reviewed: Imaging: CT coronaries 05/07/2019-visualized lungs show mild atelectasis/scarring at the base I have reviewed the images personally.  Labs: Labs from primary care 04/11/2020 CBC- WBC 7.6, hemoglobin 13,  platelets 297, eos 1.2%, absolute eosinophil count TSH [id number] Metabolic panel significant for creatinine 1.13.  Normal liver tests.  Assessment:  Consult for dyspnea May be related to ongoing symptoms of GERD, acid reflux with aspiration as she has significant GI symptoms I have reviewed her CT coronaries with mild changes at the bases which could be related to above. In addition she notes recent weight gain which may be contributing to her presentation  We will get dedicated high-resolution CT for better evaluation of lungs and PFTs.   She may need reevaluation by Dr. Paulita Fujita, GI Encouraged her to get back on an exercise program with diet and exercise.  Plan/Recommendations:  High-res CT, PFTs Weight loss with diet and exercise.  Marshell Garfinkel MD [facility] 05/19/2020, 10:49 AM  CC: Antony Contras, MD

## 2020-06-17 ENCOUNTER — Ambulatory Visit
Admission: RE | Admit: 2020-06-17 | Discharge: 2020-06-17 | Disposition: A | Discharge status: Home / Self Care | Payer: Medicare Other | Source: Ambulatory Visit | Attending: Pulmonary Disease | Admitting: Pulmonary Disease

## 2020-06-17 ENCOUNTER — Other Ambulatory Visit: Payer: Self-pay

## 2020-06-17 DIAGNOSIS — R0602 Shortness of breath: Secondary | ICD-10-CM

## 2020-06-17 IMAGING — CT CT CHEST HIGH RESOLUTION W/O CM
1 of 3 series · 14 of 29 positions shown, 18 images · non-contrast
Comparison: [DATE] coronary CT.

CLINICAL DATA: Chronic dyspnea for over 2 years. History of left
breast lumpectomy.

EXAM:
CT CHEST WITHOUT CONTRAST
TECHNIQUE: Multidetector CT imaging of the chest was performed following the
standard protocol without intravenous contrast. High resolution
imaging of the lungs, as well as inspiratory and expiratory imaging,
was performed.

[Series 2: chest · axial · 0.66mm/px · z∈[-256,-0]mm · 14 of 142 slices shown, 18 images]
[im 7/142  mediastinal]
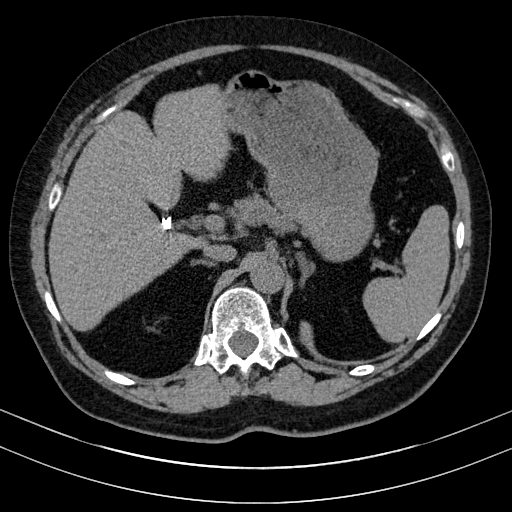
[im 7/142  lung]
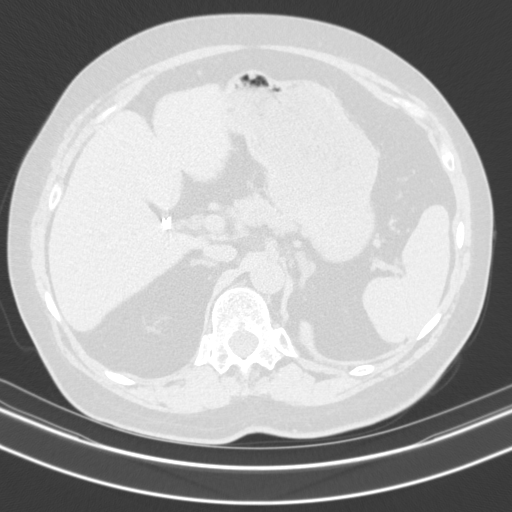
[im 21/142  lung]
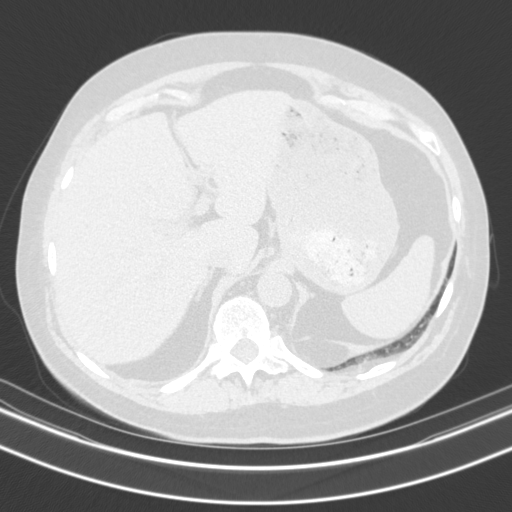
[im 27/142  lung]
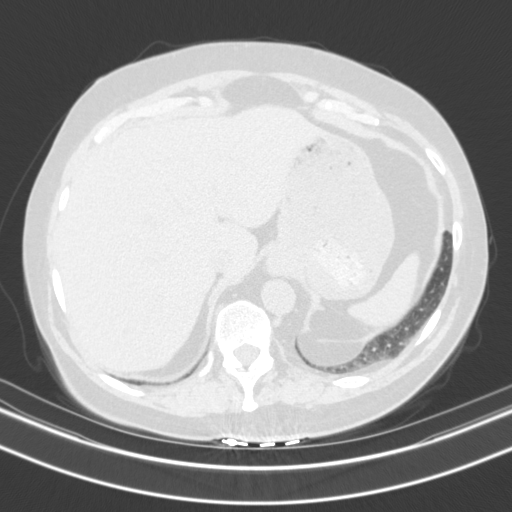
[im 41/142  lung]
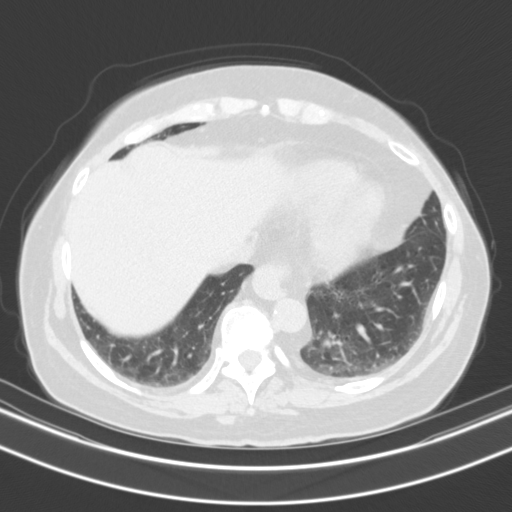
[im 48/142  mediastinal]
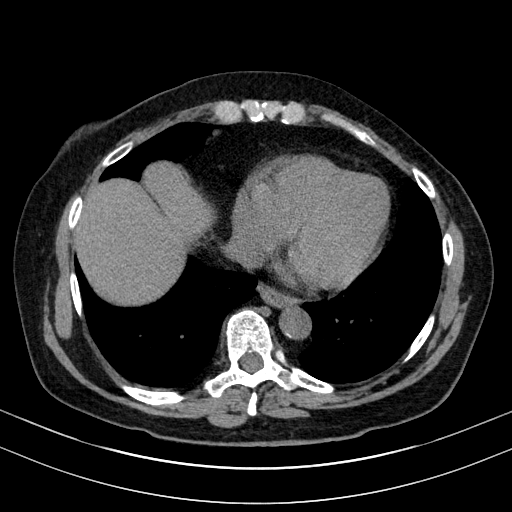
[im 48/142  lung]
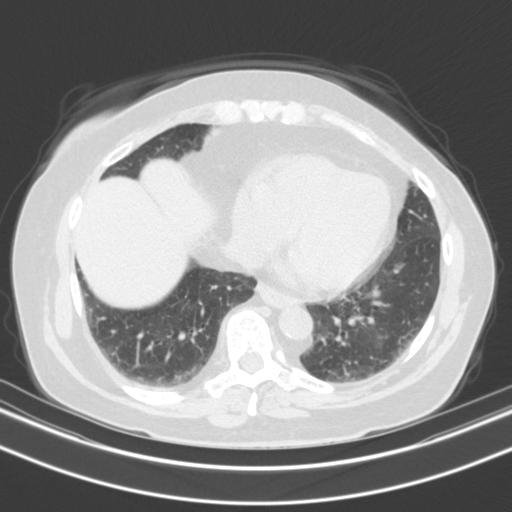
[im 61/142  lung]
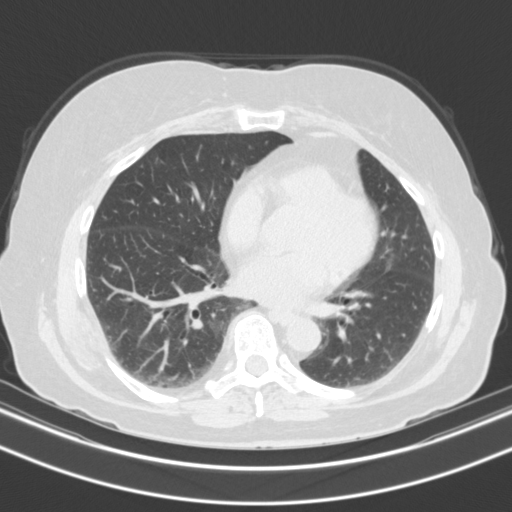
[im 67/142  lung]
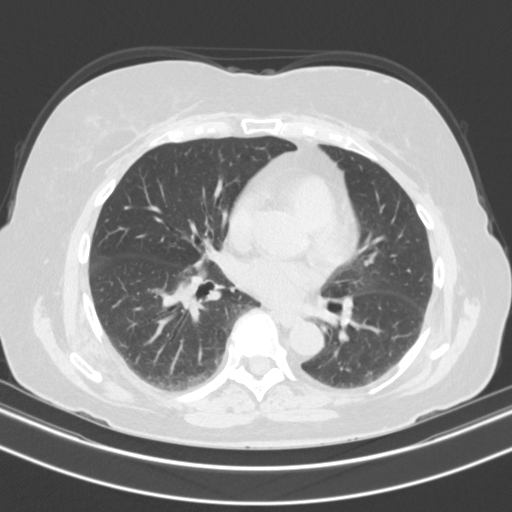
[im 74/142  lung]
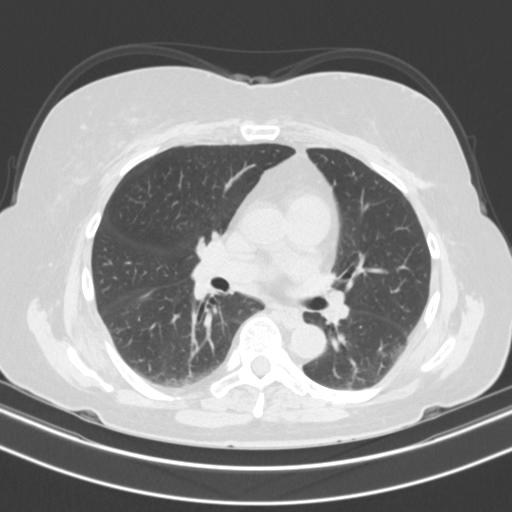
[im 81/142  mediastinal]
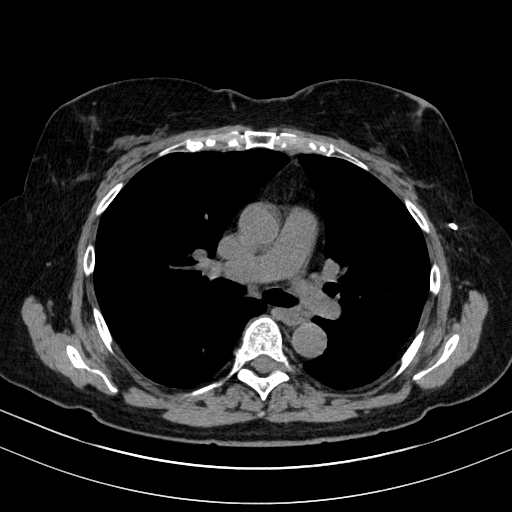
[im 81/142  lung]
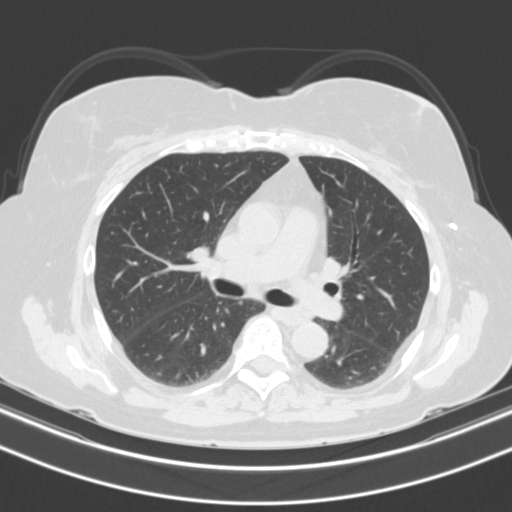
[im 95/142  lung]
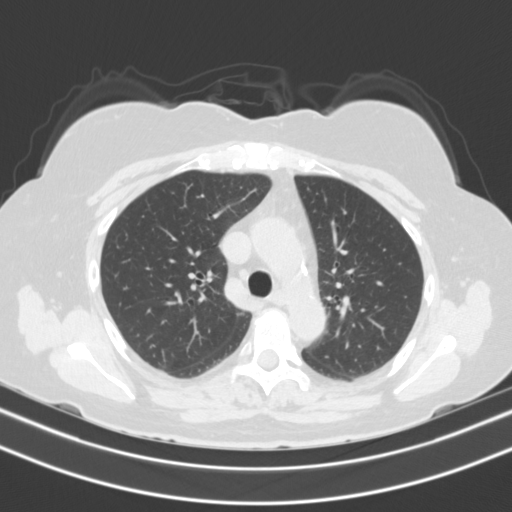
[im 101/142  lung]
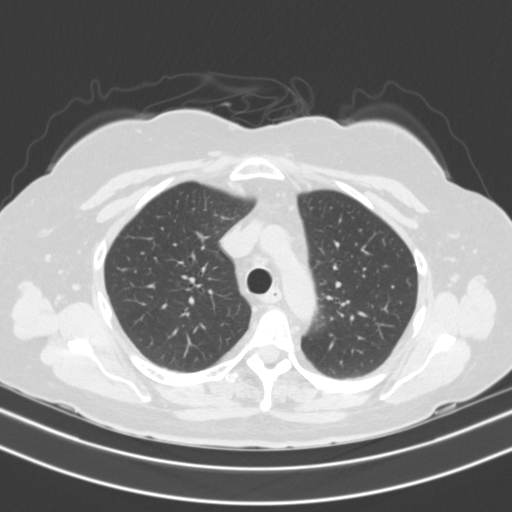
[im 115/142  lung]
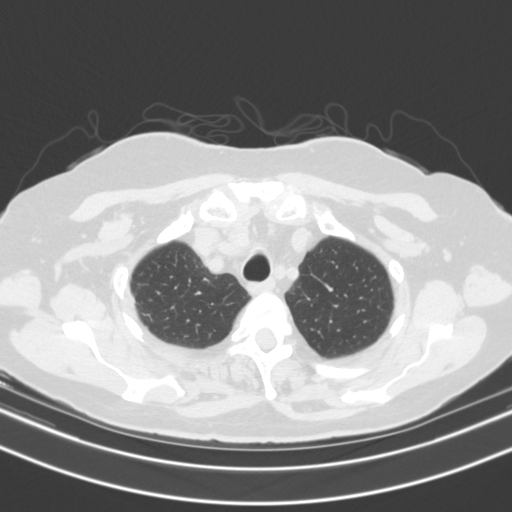
[im 121/142  mediastinal]
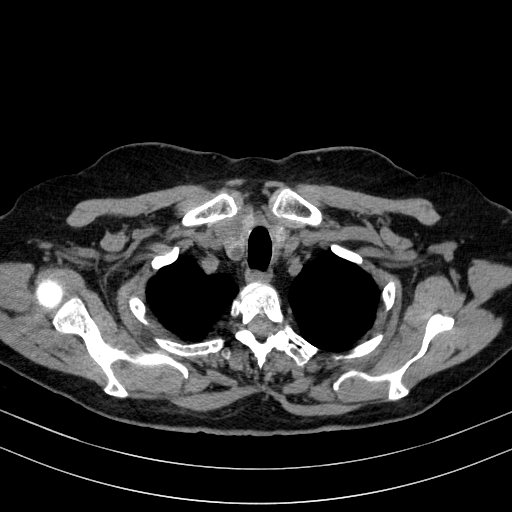
[im 121/142  lung]
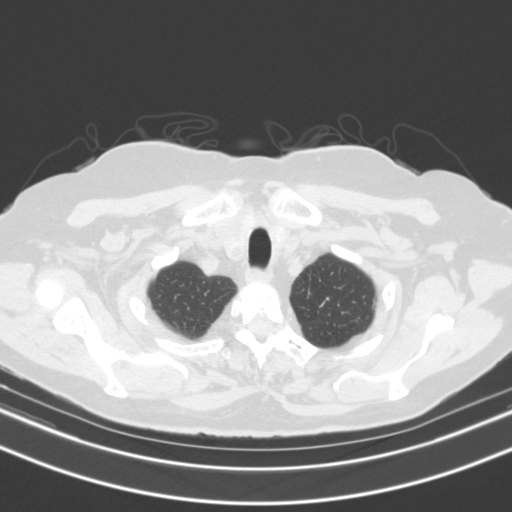
[im 135/142  lung]
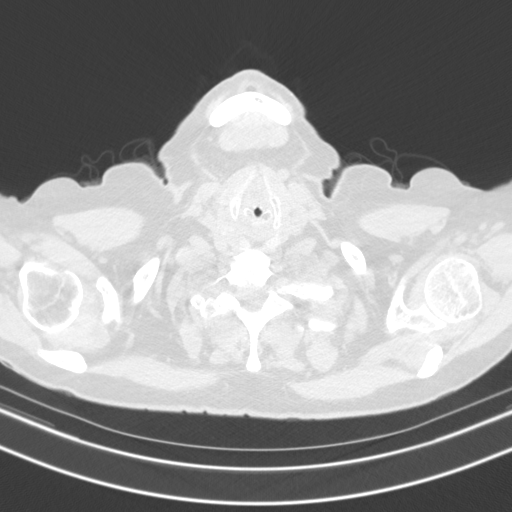

[14 of 29 positions shown; findings below may reference images not displayed]

FINDINGS: Cardiovascular: Normal heart size. No significant pericardial
effusion/thickening. Atherosclerotic nonaneurysmal thoracic aorta.
Normal caliber pulmonary arteries.

Mediastinum/Nodes: Multiple bilateral hypodense thyroid nodules,
largest 2.5 cm on the right. Unremarkable esophagus. No
pathologically enlarged axillary, mediastinal or hilar lymph nodes,
noting limited sensitivity for the detection of hilar adenopathy on
this noncontrast study. Surgical clips in the left axilla.

Lungs/Pleura: No pneumothorax. No pleural effusion. Solid 3 mm right
middle lobe pulmonary nodule (series 5/image 81), stable, considered
benign. No acute consolidative airspace disease, lung masses or new
significant pulmonary nodules. Moderate patchy air trapping in both
lungs on the expiration sequence, without evidence of
tracheobronchomalacia. Mild patchy ground-glass opacity with a
dependent lower lobe predominance. Minimal traction bronchiolectasis
in basilar lower lobes. No significant regions of architectural
distortion or frank honeycombing.

Upper abdomen: Small hiatal hernia. Scattered simple small liver
cysts, largest 1.9 cm in the left liver lobe. Cholecystectomy.

Musculoskeletal: No aggressive appearing focal osseous lesions. Mild
thoracic spondylosis.
IMPRESSION: 1. Moderate patchy air trapping in both lungs, indicative of small
airways disease. Mild patchy ground-glass opacity and minimal
traction bronchiolectasis with a dependent lower lobe predominance.
No honeycombing. Findings are suggestive of a mild interstitial lung
disease such as nonspecific interstitial pneumonia (NSIP) or chronic
hypersensitivity pneumonitis. Findings are suggestive of an
alternative diagnosis (not UIP) per consensus guidelines: Diagnosis
of Idiopathic Pulmonary Fibrosis: An Official Clinical Practice
Guideline. Am J Care Med Vol 198, 5, -, 19 [SR].
2. Small hiatal hernia.
3. Multiple bilateral hypodense thyroid nodules, largest 2.5 cm on
the right. Recommend thyroid US (ref: [HOSPITAL]. [SR]
4. Aortic Atherosclerosis ([SR]-[SR]).

## 2020-06-29 ENCOUNTER — Other Ambulatory Visit: Payer: Self-pay | Admitting: Pulmonary Disease

## 2020-06-29 ENCOUNTER — Other Ambulatory Visit: Payer: Medicare Other

## 2020-06-29 ENCOUNTER — Other Ambulatory Visit: Payer: Self-pay | Admitting: Family Medicine

## 2020-06-29 ENCOUNTER — Ambulatory Visit
Admission: RE | Admit: 2020-06-29 | Discharge: 2020-06-29 | Disposition: A | Discharge status: Home / Self Care | Payer: Medicare Other | Source: Ambulatory Visit | Attending: Family Medicine | Admitting: Family Medicine

## 2020-06-29 DIAGNOSIS — S8001XA Contusion of right knee, initial encounter: Secondary | ICD-10-CM

## 2020-06-29 DIAGNOSIS — J849 Interstitial pulmonary disease, unspecified: Secondary | ICD-10-CM

## 2020-06-29 IMAGING — DX DG KNEE COMPLETE 4+V*R*
4 series · 4 of 4 positions shown · non-contrast
Comparison: None.

CLINICAL DATA: Anterior right knee pain after fall 3 days ago

EXAM:
RIGHT KNEE - COMPLETE 4+ VIEW

[dg knee complete 4 views right (1 of 4)]
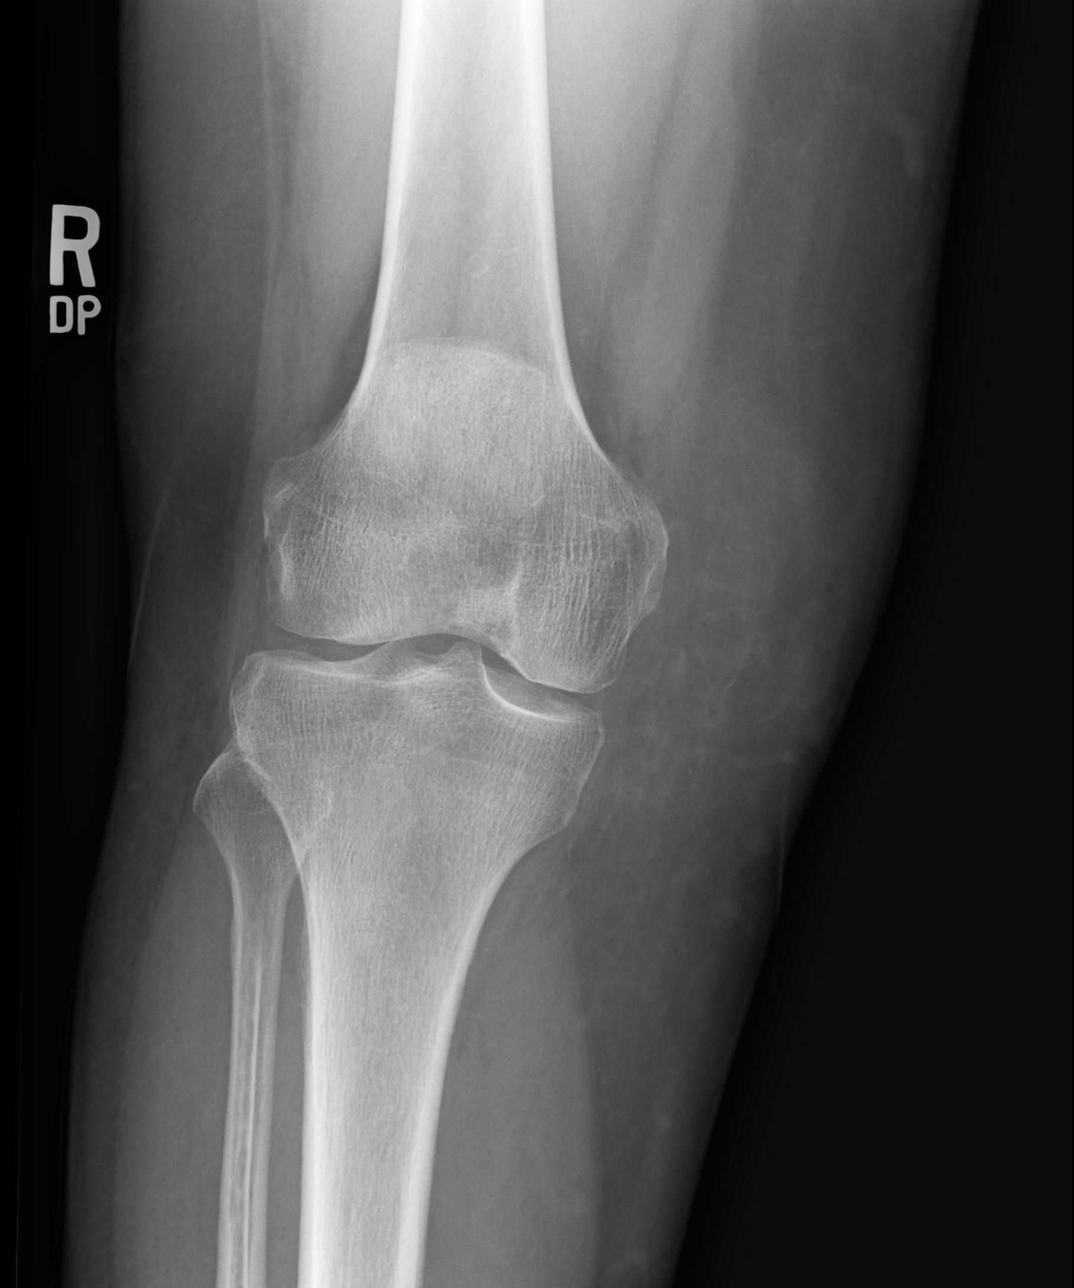

[dg knee complete 4 views right (2 of 4)]
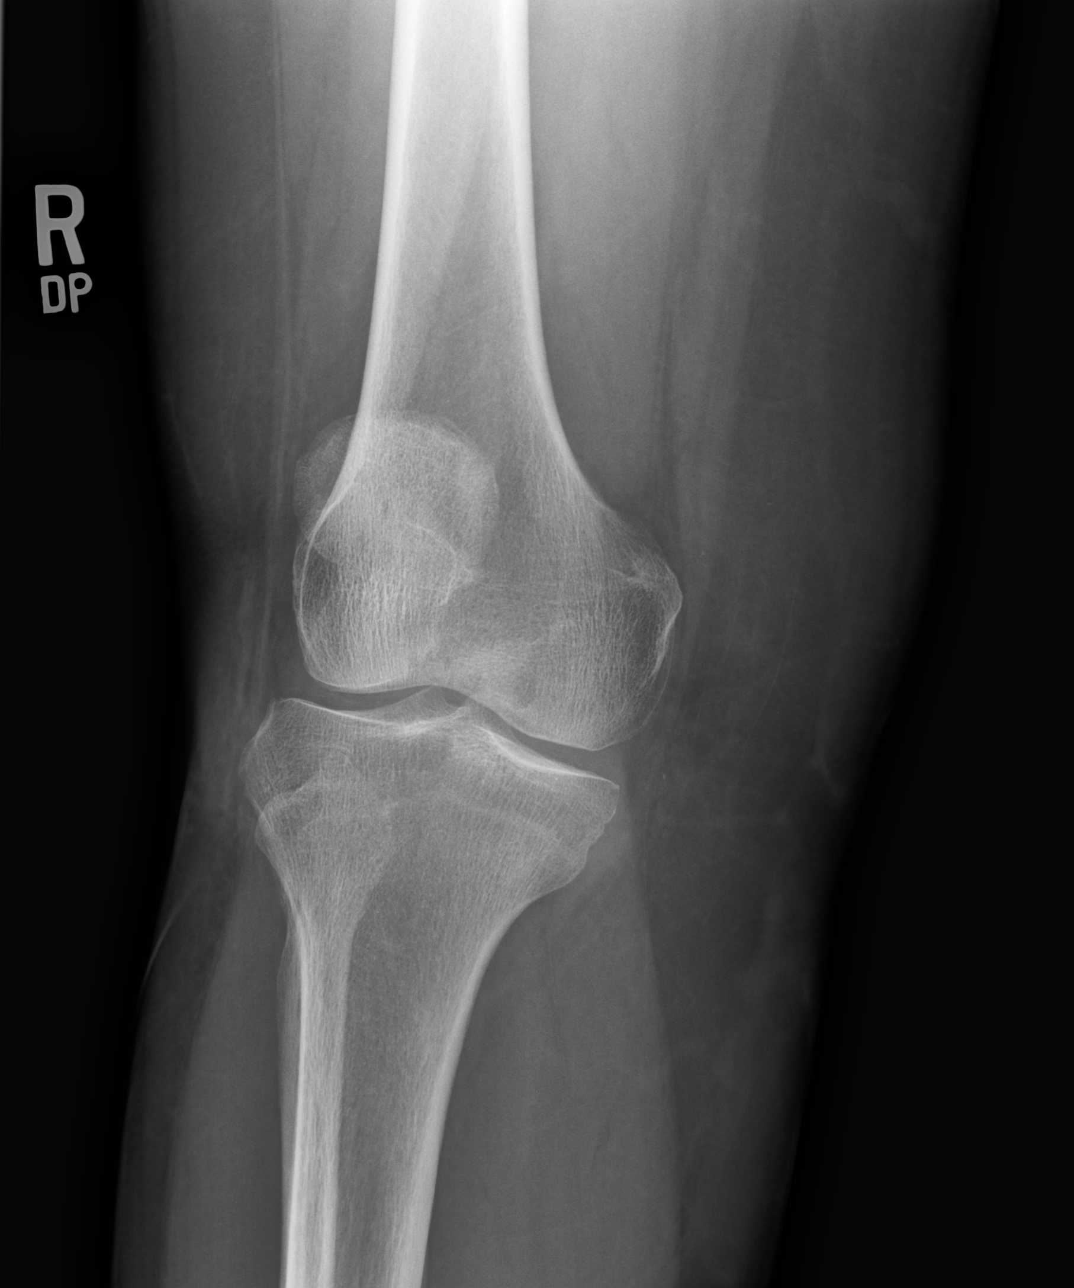

[dg knee complete 4 views right (3 of 4)]
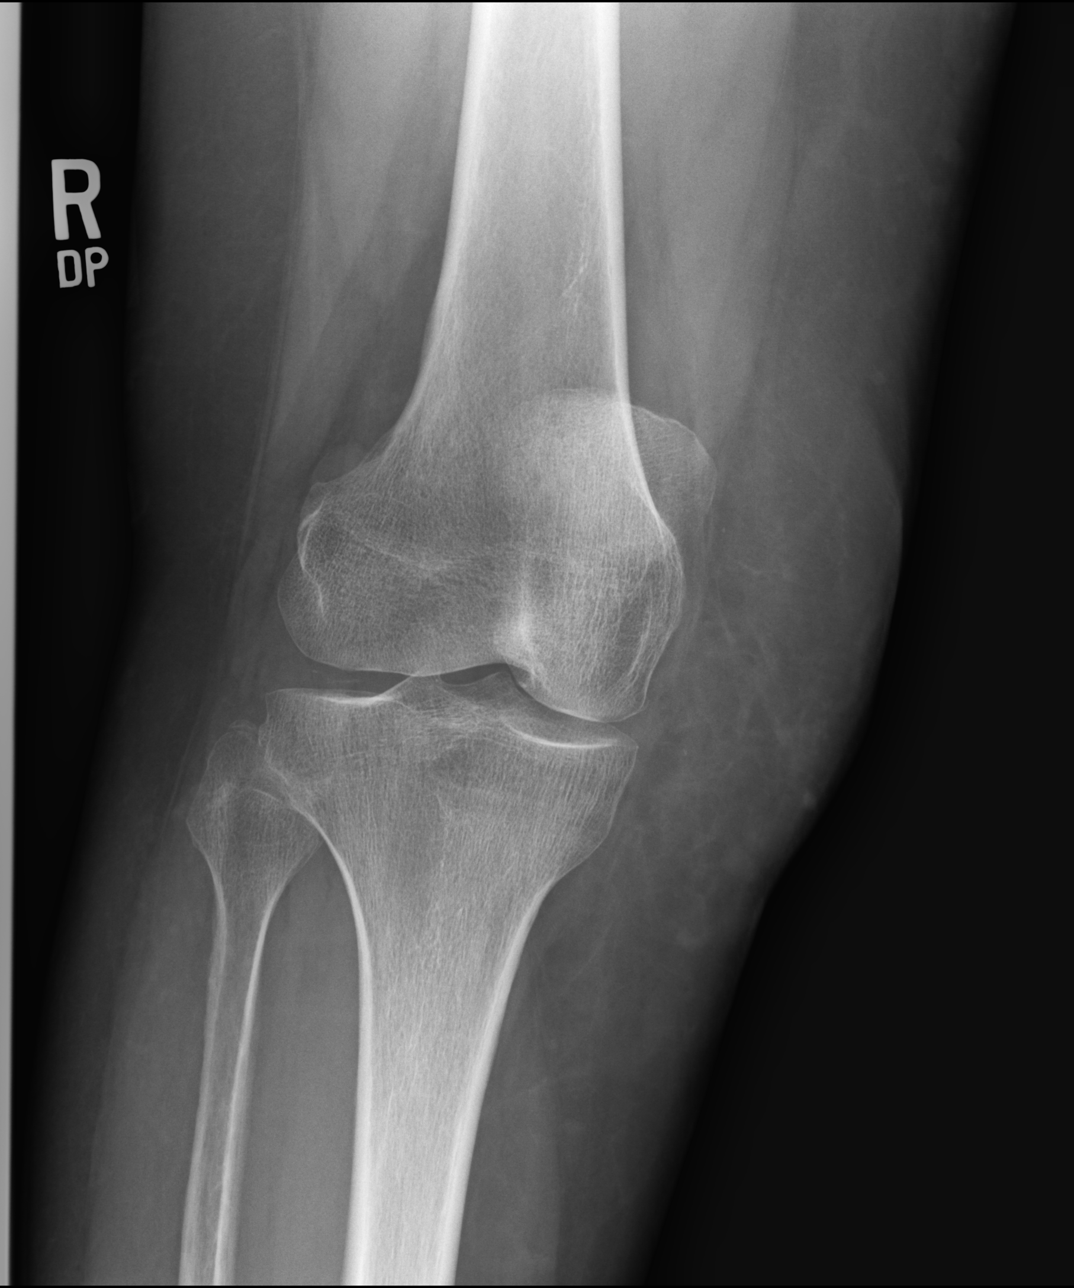

[dg knee complete 4 views right (4 of 4)]
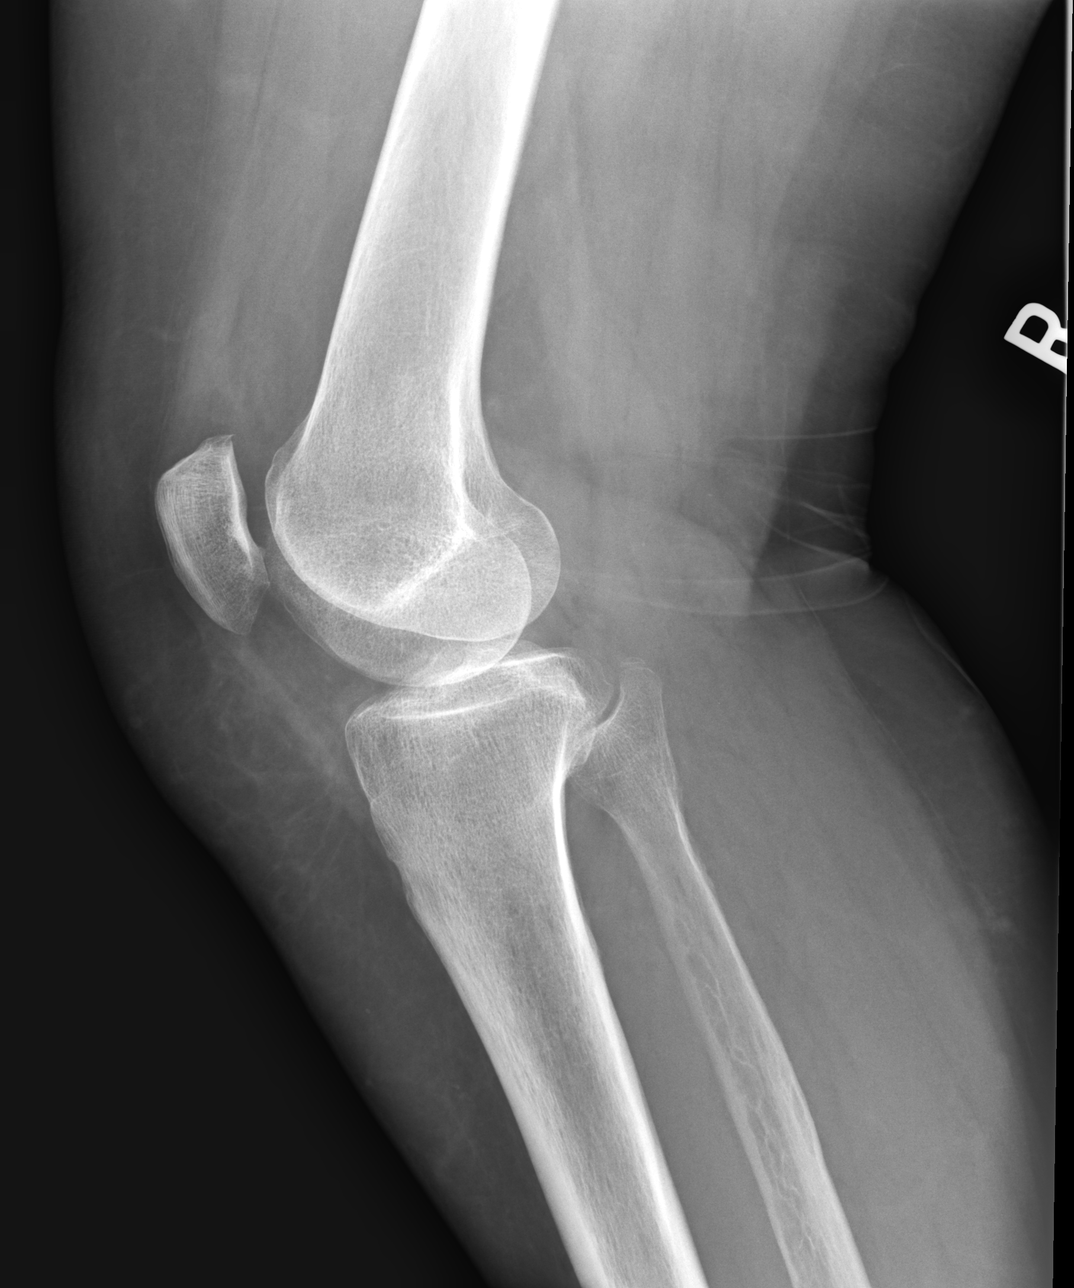

[4 of 4 positions shown; findings below may reference images not displayed]

FINDINGS: No evidence of acute fracture, dislocation, or joint effusion. Joint
spaces appear maintained. Subtle chondrocalcinosis within the
lateral compartment. Well corticated ossification along the superior
margin of the fibular head, which may reflect sequela of remote
trauma. Soft tissues are unremarkable.
IMPRESSION: Negative.

## 2020-06-30 ENCOUNTER — Other Ambulatory Visit: Payer: Self-pay

## 2020-06-30 ENCOUNTER — Encounter: Payer: Self-pay | Admitting: Podiatry

## 2020-06-30 ENCOUNTER — Ambulatory Visit (INDEPENDENT_AMBULATORY_CARE_PROVIDER_SITE_OTHER): Payer: Medicare Other

## 2020-06-30 ENCOUNTER — Ambulatory Visit (INDEPENDENT_AMBULATORY_CARE_PROVIDER_SITE_OTHER): Payer: Medicare Other | Admitting: Podiatry

## 2020-06-30 ENCOUNTER — Ambulatory Visit
Admission: RE | Admit: 2020-06-30 | Discharge: 2020-06-30 | Disposition: A | Discharge status: Home / Self Care | Payer: Medicare Other | Source: Ambulatory Visit | Attending: Family Medicine | Admitting: Family Medicine

## 2020-06-30 DIAGNOSIS — Q828 Other specified congenital malformations of skin: Secondary | ICD-10-CM

## 2020-06-30 DIAGNOSIS — M8580 Other specified disorders of bone density and structure, unspecified site: Secondary | ICD-10-CM

## 2020-06-30 DIAGNOSIS — S9032XA Contusion of left foot, initial encounter: Secondary | ICD-10-CM

## 2020-06-30 NOTE — Progress Notes (Signed)
She presents today for a chief complaint of a painful corn fifth digit of her left foot and she is also having pain across the dorsal aspect of the left foot which is improving over the past 3 days or so.  She states that while coming back from [place] she was in [facility] airport and fell hurting her foot and her knee right.  Objective: Vital signs are stable she is alert and oriented x3.  Pulses are palpable bilateral.  Left foot does demonstrate no ecchymosis mild tenderness on palpation of the neck of the second metatarsal radiographically this does not demonstrate any type of fractures.  Reactive hyperkeratotic tissue overlying the fifth digit PIPJ left foot.  Assessment: I feel that this is more of a bony contusion on second metatarsal and that it does not show any type of fracture I expressed to her that if it continues on we need to rex-ray it.  Painful corn fifth digit left foot.  Plan: Debridement of reactive hyperkeratotic lesion follow-up with me if the pain across the dorsum of the foot does not improve.

## 2020-07-02 ENCOUNTER — Other Ambulatory Visit (HOSPITAL_COMMUNITY)
Admission: RE | Admit: 2020-07-02 | Discharge: 2020-07-02 | Disposition: A | Discharge status: Home / Self Care | Payer: Medicare Other | Source: Ambulatory Visit | Attending: Pulmonary Disease | Admitting: Pulmonary Disease

## 2020-07-02 DIAGNOSIS — Z20822 Contact with and (suspected) exposure to covid-19: Secondary | ICD-10-CM | POA: Diagnosis not present

## 2020-07-02 DIAGNOSIS — Z01812 Encounter for preprocedural laboratory examination: Secondary | ICD-10-CM | POA: Diagnosis not present

## 2020-07-02 LAB — SARS CORONAVIRUS 2 (TAT 6-24 HRS): SARS Coronavirus 2: NEGATIVE

## 2020-07-03 LAB — ANA,IFA RA DIAG PNL W/RFLX TIT/PATN
Anti Nuclear Antibody (ANA): POSITIVE — AB
Cyclic Citrullin Peptide Ab: <16 UNITS
Rheumatoid fact SerPl-aCnc: <14 IU/mL (ref <14)

## 2020-07-03 LAB — SJOGREN'S SYNDROME ANTIBODS(SSA + SSB)
SSA (Ro) (ENA) Antibody, IgG: <1 AI
SSB (La) (ENA) Antibody, IgG: <1 AI

## 2020-07-03 LAB — ANCA SCREEN W REFLEX TITER: ANCA Screen: NEGATIVE

## 2020-07-03 LAB — ANTI-NUCLEAR AB-TITER (ANA TITER): ANA Titer 1: 1:80 {titer} — ABNORMAL HIGH

## 2020-07-04 LAB — HYPERSENSITIVITY PNEUMONITIS
A. Pullulans Abs: NEGATIVE
A.Fumigatus #1 Abs: NEGATIVE
Micropolyspora faeni, IgG: NEGATIVE
Pigeon Serum Abs: NEGATIVE
Thermoact. Saccharii: NEGATIVE
Thermoactinomyces vulgaris, IgG: NEGATIVE

## 2020-07-05 ENCOUNTER — Ambulatory Visit (INDEPENDENT_AMBULATORY_CARE_PROVIDER_SITE_OTHER): Payer: Medicare Other | Admitting: Pulmonary Disease

## 2020-07-05 ENCOUNTER — Other Ambulatory Visit: Payer: Self-pay

## 2020-07-05 ENCOUNTER — Encounter: Payer: Self-pay | Admitting: Pulmonary Disease

## 2020-07-05 VITALS — BP 134/72 | HR 68 | Temp 98.4°F | Ht 65.0 in | Wt 188.0 lb

## 2020-07-05 DIAGNOSIS — J849 Interstitial pulmonary disease, unspecified: Secondary | ICD-10-CM

## 2020-07-05 DIAGNOSIS — R0602 Shortness of breath: Secondary | ICD-10-CM

## 2020-07-05 LAB — PULMONARY FUNCTION TEST
DL/VA % pred: 106 %
DL/VA: 4.34 ml/min/mmHg/L
DLCO cor % pred: 84 %
DLCO cor: 16.81 ml/min/mmHg
DLCO unc % pred: 84 %
DLCO unc: 16.81 ml/min/mmHg
FEF 25-75 Post: 5.07 L/sec
FEF 25-75 Pre: 3.45 L/sec
FEF2575-%Change-Post: 46 %
FEF2575-%Pred-Post: 287 %
FEF2575-%Pred-Pre: 195 %
FEV1-%Change-Post: 11 %
FEV1-%Pred-Post: 106 %
FEV1-%Pred-Pre: 95 %
FEV1-Post: 2.39 L
FEV1-Pre: 2.15 L
FEV1FVC-%Change-Post: 2 %
FEV1FVC-%Pred-Pre: 115 %
FEV6-%Change-Post: 8 %
FEV6-%Pred-Post: 94 %
FEV6-%Pred-Pre: 87 %
FEV6-Post: 2.69 L
FEV6-Pre: 2.48 L
FEV6FVC-%Pred-Post: 104 %
FEV6FVC-%Pred-Pre: 104 %
FVC-%Change-Post: 8 %
FVC-%Pred-Post: 90 %
FVC-%Pred-Pre: 83 %
FVC-Post: 2.69 L
FVC-Pre: 2.48 L
Post FEV1/FVC ratio: 89 %
Post FEV6/FVC ratio: 100 %
Pre FEV1/FVC ratio: 87 %
Pre FEV6/FVC Ratio: 100 %
RV % pred: 76 %
RV: 1.76 L
TLC % pred: 86 %
TLC: 4.53 L

## 2020-07-05 NOTE — Progress Notes (Signed)
Full PFT performed today. °

## 2020-07-05 NOTE — Progress Notes (Addendum)
Yvonne Jordan    [id number]    07-Nov-1946  Primary Care Physician:Swayne, Shanon Brow, MD  Referring Physician: Antony Contras, MD [place]vonne [place],  [name]  Chief complaint: Follow-up for dyspnea, unspecified interstitial lung disease  HPI: 74 year old with history of CKD, GERD Complains of shortness of breath on exertion, worsened by bending over, chewing or eating.  Denies any cough, sputum production, wheezing  Evaluated by Dr. Melvyn Novas in 2016.  Esophagram in 2015 shows laryngeal penetration with thick barium, no aspiration.  Evaluated by speech therapy in 2015.  She follows with Dr. Paulita Fujita at [facility].  Cardiology evaluation recently with no significant abnormality. She has significant GERD symptoms and is on PPI 1-2 times a day.   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  No down pillows or comforters Smoking history: Never smoker Travel history: Originally from [place].  Previously lived in [place].  No significant recent travel Relevant family history: No significant family history of lung disease  Interim history: Here for review of CT scan and PFTs.  States that dyspnea on exertion is unchanged.  Outpatient Encounter Medications as of 07/05/2020  Medication Sig  . acetaminophen (TYLENOL) 325 MG tablet Take 650 mg by mouth every 6 (six) hours as needed. At night if needed  . atorvastatin (LIPITOR) 20 MG tablet   . Cholecalciferol (VITAMIN D3) 2000 UNITS TABS Take by mouth.  . clopidogrel (PLAVIX) 75 MG tablet Take 75 mg by mouth daily with breakfast.  . Multiple Vitamin (MULTIVITAMIN) capsule Take 1 capsule by mouth daily.  . pantoprazole (PROTONIX) 40 MG tablet Take 1 tablet (40 mg total) by mouth 2 (two) times daily before a meal. For 30 days  . diclofenac Sodium (VOLTAREN) 1 % GEL Voltaren 1 % topical  gel  APPLY 2 GRAMS TO THE AFFECTED AREA(S) BY TOPICAL ROUTE 4 TIMES PER DAY (Patient not taking: Reported on 07/05/2020)   No facility-administered encounter medications on file as of 07/05/2020.   Physical Exam: Blood pressure 134/72, pulse 68, temperature 98.4 F (36.9 C), temperature source Oral, height 5\' 5"  (1.651 m), weight 188 lb (85.3 kg), SpO2 94 %. Gen:      No acute distress HEENT:  EOMI, sclera anicteric Neck:     No masses; no thyromegaly Lungs:    Clear to auscultation bilaterally; normal respiratory effort CV:         Regular rate and rhythm; no murmurs Abd:      + bowel sounds; soft, non-tender; no palpable masses, no distension Ext:    No edema; adequate peripheral perfusion Skin:      Warm and dry; no rash Neuro: alert and oriented x 3 Psych: normal mood and affect  Data Reviewed: Imaging: CT coronaries 05/07/2019-visualized lungs show mild atelectasis/scarring at the base. High-resolution CT 06/17/2020-moderate air trapping with groundglass opacities, traction bronchiectasis with lower lobe predominance.  Alternate diagnosis. I have reviewed the images personally.  PFTs: 07/05/2020  Labs: Labs from primary care 04/11/2020 CBC- WBC 7.6, hemoglobin 13, platelets 297, eos 1.2%, absolute eosinophil count TSH 0.97 Metabolic panel significant for creatinine 1.13.  Normal liver tests.  ILD panel 06/29/2020 Negative for hypersensitivity panel, RA, CCP.  ANA 1:80, nuclear  Assessment:  Interstitial lung disease High-res CT reviewed with mild changes in the  lower lobes in alternate pattern She does endorse some mold exposure though HP panel is negative.  ANA is borderline positive Changes may also be related to ongoing symptoms of GERD, acid reflux with aspiration as she has significant GI symptoms. Differential diagnosis includes HP, CTD related NSIP or IPF  Discussed findings.  Patient is anxious to get a diagnosis. Reviewed options including conservative management  with monitoring,  bronchoscope with lavage/biopsy or surgical lung biopsy.  She wants to get a definitive answer and has elected to go for surgical lung biopsy after informed decision making.  She is aware of the risk/benefit of the procedure.  Plan/Recommendations: Referral to cardiothoracic surgery for lung biopsy  Marshell Garfinkel MD [facility] 07/05/2020, 12:11 PM  CC: Antony Contras, MD

## 2020-07-05 NOTE — Patient Instructions (Signed)
We will make a referral to cardiothoracic surgery for surgical lung biopsy evaluation of interstitial lung disease Follow-up in 1 to 2 months

## 2020-07-08 DIAGNOSIS — M25561 Pain in right knee: Secondary | ICD-10-CM | POA: Diagnosis not present

## 2020-07-08 DIAGNOSIS — M25551 Pain in right hip: Secondary | ICD-10-CM | POA: Diagnosis not present

## 2020-07-13 ENCOUNTER — Institutional Professional Consult (permissible substitution) (INDEPENDENT_AMBULATORY_CARE_PROVIDER_SITE_OTHER): Payer: Medicare Other | Admitting: Thoracic Surgery (Cardiothoracic Vascular Surgery)

## 2020-07-13 ENCOUNTER — Other Ambulatory Visit: Payer: Self-pay | Admitting: *Deleted

## 2020-07-13 ENCOUNTER — Other Ambulatory Visit: Payer: Self-pay

## 2020-07-13 ENCOUNTER — Encounter: Payer: Self-pay | Admitting: Thoracic Surgery (Cardiothoracic Vascular Surgery)

## 2020-07-13 ENCOUNTER — Encounter: Payer: Self-pay | Admitting: *Deleted

## 2020-07-13 VITALS — BP 152/84 | HR 97 | Temp 97.0°F | Resp 20 | Ht 65.0 in | Wt 185.0 lb

## 2020-07-13 DIAGNOSIS — J849 Interstitial pulmonary disease, unspecified: Secondary | ICD-10-CM | POA: Diagnosis not present

## 2020-07-13 NOTE — Progress Notes (Signed)
[address]       [place] [address]             [phone]                    Yvonne Jordan [facility] Medical Record #[id number] Date of Birth: Apr 03, 1946  Referring: Marshell Garfinkel, MD Primary Care: Antony Contras, MD Primary Cardiologist: Buford Dresser, MD  Chief Complaint:    Chief Complaint  Patient presents with  . Lung Lesion    Consultation, Chest CT 6/25, PFT 7/13    History of Present Illness:    Yvonne Jordan 74 y.o. female presents for surgical evaluation of progressive shortness of breath.  She has been evaluated by Dr. Vaughan Browner, and there is concern that she may have an interstitial lung disease.  In regards to her symptoms, her dyspnea has progressed over the last 2-1/2 years.  She now is short of breath with minimal activity, and on a daily basis she has severe bouts.  These are often associated with increasing her activity.  She denies any chest pain or pleuritic chest pain.  Her weight has been stable.  She does admit to a history of reflux but states that this is well controlled for the past 3 years on her PPI.  She denies any chronic cough.    Smoking Hx: Lifelong non-smoker but most of her friends do smoke.  She has worked in [employer] of her career, and has not had any environmental exposures.   Zubrod Score: At the time of surgery this patient's most appropriate activity status/level should be described as: []     0    Normal activity, no symptoms [x]     1    Restricted in physical strenuous activity but ambulatory, able to do out light work []     2    Ambulatory and capable of self care, unable to do work activities, up and about               >50 % of waking hours                              []     3    Only limited self care, in bed greater than 50% of waking hours []     4    Completely disabled, no self care, confined to bed or chair []     5    Moribund   Past Medical History:  Diagnosis Date  . Allergic  rhinitis   . Cancer (HCC)    Breast  . CKD (chronic kidney disease)   . Colon polyp   . Hyperlipidemia   . Personal history of radiation therapy   . TIA (transient ischemic attack)   . Vitamin D deficiency     Past Surgical History:  Procedure Laterality Date  . BREAST LUMPECTOMY Left 2006  . BREAST SURGERY     2006...treated with Tamoxifen for 5 years  . CHOLECYSTECTOMY      Family History  Problem Relation Age of Onset  . Breast cancer Sister   . Heart attack Sister   . Heart disease Father   . Stroke Father   . Heart disease Maternal Uncle   . Heart disease Maternal Uncle   . Heart disease Maternal Uncle   . Stroke Mother   . Deep vein thrombosis Mother  Social History   Tobacco Use  Smoking Status Never Smoker  Smokeless Tobacco Never Used    Social History   Substance and Sexual Activity  Alcohol Use Yes  . Alcohol/week: 0.0 standard drinks   Comment: 1 drink per wk     Allergies  Allergen Reactions  . Other     Current Outpatient Medications  Medication Sig Dispense Refill  . acetaminophen (TYLENOL) 325 MG tablet Take 650 mg by mouth every 6 (six) hours as needed. At night if needed    . atorvastatin (LIPITOR) 20 MG tablet     . clopidogrel (PLAVIX) 75 MG tablet Take 75 mg by mouth daily with breakfast.    . Multiple Vitamin (MULTIVITAMIN) capsule Take 1 capsule by mouth daily.    . pantoprazole (PROTONIX) 40 MG tablet Take 1 tablet (40 mg total) by mouth 2 (two) times daily before a meal. For 30 days 180 tablet 3  . Cholecalciferol (VITAMIN D3) 2000 UNITS TABS Take by mouth. (Patient not taking: Reported on 07/13/2020)    . diclofenac Sodium (VOLTAREN) 1 % GEL Voltaren 1 % topical gel  APPLY 2 GRAMS TO THE AFFECTED AREA(S) BY TOPICAL ROUTE 4 TIMES PER DAY (Patient not taking: Reported on 07/05/2020)     No current facility-administered medications for this visit.    Review of Systems  Constitutional: Positive for malaise/fatigue. Negative  for fever and weight loss.  HENT: Negative for sinus pain and sore throat.   Respiratory: Positive for shortness of breath. Negative for cough and stridor.   Cardiovascular: Negative for chest pain.  Gastrointestinal: Positive for heartburn. Negative for abdominal pain.  Musculoskeletal: Positive for joint pain.  Neurological: Negative.      PHYSICAL EXAMINATION: BP (!) 152/84 (BP Location: Right Arm, Patient Position: Sitting, Cuff Size: Normal)   Pulse 97   Temp (!) 97 F (36.1 C) (Temporal)   Resp 20   Ht 5\' 5"  (1.651 m)   Wt 185 lb (83.9 kg)   SpO2 96% Comment: RA  BMI 30.79 kg/m  Physical Exam Constitutional:      Appearance: Normal appearance.  HENT:     Head: Normocephalic and atraumatic.  Eyes:     Extraocular Movements: Extraocular movements intact.     Conjunctiva/sclera: Conjunctivae normal.  Cardiovascular:     Rate and Rhythm: Normal rate and regular rhythm.     Heart sounds: No murmur heard.   Pulmonary:     Effort: Pulmonary effort is normal. No respiratory distress.     Breath sounds: Normal breath sounds.  Abdominal:     General: Abdomen is flat. There is no distension.  Musculoskeletal:        General: Normal range of motion.  Skin:    General: Skin is warm and dry.  Neurological:     General: No focal deficit present.     Mental Status: She is alert and oriented to person, place, and time.     Diagnostic Studies & Laboratory data:     Recent Radiology Findings:   CT Chest High Resolution  Result Date: 06/17/2020 CLINICAL DATA:  Chronic dyspnea for over 2 years. History of left breast lumpectomy. EXAM: CT CHEST WITHOUT CONTRAST TECHNIQUE: Multidetector CT imaging of the chest was performed following the standard protocol without intravenous contrast. High resolution imaging of the lungs, as well as inspiratory and expiratory imaging, was performed. COMPARISON:  05/07/2019 coronary CT. FINDINGS: Cardiovascular: Normal heart size. No significant  pericardial effusion/thickening. Atherosclerotic nonaneurysmal thoracic aorta. Normal caliber  pulmonary arteries. Mediastinum/Nodes: Multiple bilateral hypodense thyroid nodules, largest 2.5 cm on the right. Unremarkable esophagus. No pathologically enlarged axillary, mediastinal or hilar lymph nodes, noting limited sensitivity for the detection of hilar adenopathy on this noncontrast study. Surgical clips in the left axilla. Lungs/Pleura: No pneumothorax. No pleural effusion. Solid 3 mm right middle lobe pulmonary nodule (series 5/image 81), stable, considered benign. No acute consolidative airspace disease, lung masses or new significant pulmonary nodules. Moderate patchy air trapping in both lungs on the expiration sequence, without evidence of tracheobronchomalacia. Mild patchy ground-glass opacity with a dependent lower lobe predominance. Minimal traction bronchiolectasis in basilar lower lobes. No significant regions of architectural distortion or frank honeycombing. Upper abdomen: Small hiatal hernia. Scattered simple small liver cysts, largest 1.9 cm in the left liver lobe. Cholecystectomy. Musculoskeletal: No aggressive appearing focal osseous lesions. Mild thoracic spondylosis. IMPRESSION: 1. Moderate patchy air trapping in both lungs, indicative of small airways disease. Mild patchy ground-glass opacity and minimal traction bronchiolectasis with a dependent lower lobe predominance. No honeycombing. Findings are suggestive of a mild interstitial lung disease such as nonspecific interstitial pneumonia (NSIP) or chronic hypersensitivity pneumonitis. Findings are suggestive of an alternative diagnosis (not UIP) per consensus guidelines: Diagnosis of Idiopathic Pulmonary Fibrosis: An Official Clinical Practice Guideline. [place], 5, -, 19 2018. 2. Small hiatal hernia. 3. Multiple bilateral hypodense thyroid nodules, largest 2.5 cm on the right. Recommend thyroid US (ref: J Am Coll Radiol. 2015  Feb;12(2): 143-50). 4. Aortic Atherosclerosis (ICD10-I70.0). Electronically Signed   By: Ilona Sorrel M.D.   On: 06/17/2020 15:03   DG BONE DENSITY (DXA)  Result Date: 06/30/2020 EXAM: DUAL X-RAY ABSORPTIOMETRY (DXA) FOR BONE MINERAL DENSITY IMPRESSION: Referring Physician:  Antony Contras Your patient completed a BMD test using Lunar IDXA DXA system ( analysis version: 16 ) manufactured by [employer]. Technologist: AW PATIENT: Name: Kysha, Muralles Patient ID: [id number] Birth Date: 08-23-1946 Height: 64.0 in. Sex: Female Measured: 06/30/2020 Weight: 188.6 lbs. Indications: Advanced Age, Caucasian, Estrogen Deficient, Postmenopausal Fractures: None Treatments: Calcium (E943.0), Vitamin D (E933.5) ASSESSMENT: The BMD measured at Femur Neck Right is 0.881 g/cm2 with a T-score of -1.1. This patient is considered osteopenic according to [facility]) criteria. The scan quality is good. L-3 and L-4 were excluded due to degenerative changes. Site Region Measured Date Measured Age YA BMD Significant CHANGE T-score DualFemur Neck Right 06/30/2020    74.6         -1.1    0.881 g/cm2 AP Spine  L1-L2      06/30/2020    74.6         -0.6    1.092 g/cm2 DualFemur Total Mean 06/30/2020    74.6         -0.1    0.992 g/cm2 World Health Organization [facility]) criteria for post-menopausal, Caucasian Women: Normal       T-score at or above -1 SD Osteopenia   T-score between -1 and -2.5 SD Osteoporosis T-score at or below -2.5 SD RECOMMENDATION: 1. All patients should optimize calcium and vitamin D intake. 2. Consider FDA approved medical therapies in postmenopausal women and men aged 23 years and older, based on the following: a. A hip or vertebral (clinical or morphometric) fracture b. T- score < or = -2.5 at the femoral neck or spine after appropriate evaluation to exclude secondary causes c. Low bone mass (T-score between -1.0 and -2.5 at the femoral neck or spine) and a 10 year probability of  a hip fracture > or  = 3% or a 10 year probability of a major osteoporosis-related fracture > or = 20% based on the [place]-adapted WHO algorithm d. Clinician judgment and/or patient preferences may indicate treatment for people with 10-year fracture probabilities above or below these levels FOLLOW-UP: Patients with diagnosis of osteoporosis or at high risk for fracture should have regular bone mineral density tests. For patients eligible for Medicare, routine testing is allowed once every 2 years. The testing frequency can be increased to one year for patients who have rapidly progressing disease, those who are receiving or discontinuing medical therapy to restore bone mass, or have additional risk factors. I have reviewed this report and agree with the above findings. [facility] FRAX* 10-year Probability of Fracture Based on femoral neck BMD: DualFemur (Right) Major Osteoporotic Fracture: 9.5% Hip Fracture:                1.5% Population:                  [place] (Caucasian) Risk Factors:                None *FRAX is a Materials engineer of the [employer] for Metabolic Bone Disease, a World Pharmacologist (WHO) [facility]. ASSESSMENT: The probability of a major osteoporotic fracture is 9.5 % within the next ten years. The probability of a hip fracture is 1.5 % within the next ten years. Electronically Signed   By: Rolm Baptise M.D.   On: 06/30/2020 15:29   DG Knee Complete 4 Views Right  Result Date: 06/30/2020 CLINICAL DATA:  Anterior right knee pain after fall 3 days ago EXAM: RIGHT KNEE - COMPLETE 4+ VIEW COMPARISON:  None. FINDINGS: No evidence of acute fracture, dislocation, or joint effusion. Joint spaces appear maintained. Subtle chondrocalcinosis within the lateral compartment. Well corticated ossification along the superior margin of the fibular head, which may reflect sequela of remote trauma. Soft tissues are unremarkable. IMPRESSION: Negative. Electronically Signed   By:  Davina Poke D.O.   On: 06/30/2020 12:03   DG Foot Complete Left  Result Date: 06/30/2020 Please see detailed radiograph report in office note.      I have independently reviewed the above radiology studies  and reviewed the findings with the patient.   Recent Lab Findings: Lab Results  Component Value Date   CREATININE 1.10 (H) 05/07/2019     PFTs: - FVC: 83% - FEV1: 95% -DLCO: 84%  Problem list: Progressive dyspnea, concern for interstitial lung disease Multiple bilateral thyroid nodules. Small hiatal hernia History of TIA  Assessment / Plan:   74 year old female with mild airway disease concerning for interstitial lung disease.  I personally reviewed her CT scan and there is no significant radiographic disease outside of some groundglass opacities and bronchiectasis and the lower lobe mostly.  I do think that she is an adequate candidate for surgical biopsy, and the risks and benefits were discussed in great detail with her and her grandson.  She has agreed to proceed and is tentatively scheduled for July 29.  She has a history of a TIA and has been placed on Plavix, and this will be discontinued prior to surgery.  She also has a small hiatal hernia and does not have much reflux symptoms now that she is on a PPI.  There is a small concern that she may also have some silent aspiration if in the future this presents further complications or pulmonary process  then we could consider performing an antireflux procedure.    Additionally she has bilateral thyroid nodules which will be evaluated following her lung biopsy.     I  spent 55 minutes with  the patient face to face and greater then 50% of the time was spent in counseling and coordination of care.    Lajuana Matte 07/13/2020 11:33 AM

## 2020-07-14 LAB — MYOMARKER 3 PLUS PROFILE (RDL)

## 2020-07-18 NOTE — Progress Notes (Signed)
[id number] [place], [place] - [address] [place] [address] Phone: [phone] Fax: [phone]      Your procedure is scheduled on Thursday, July 29th.  Report to [facility] Main Entrance "A" at 5:30 A.M., and check in at the Admitting office.  Call this number if you have problems the morning of surgery:  [phone]  Call [phone] if you have any questions prior to your surgery date Monday-Friday 8am-4pm    Remember:  Do not eat or drink after midnight the night before your surgery     Take these medicines the morning of surgery:  Eye drops - if needed    Follow your surgeon's instructions on when to stop Plavix.  If no instructions were given by your surgeon then you will need to call the office to get those instructions.    As of today, STOP taking any Aspirin (unless otherwise instructed by your surgeon) Aleve, Naproxen, Ibuprofen, Motrin, Advil, Goody's, BC's, all herbal medications, fish oil, and all vitamins.                      Do not wear jewelry, make up, or nail polish            Do not wear lotions, powders, perfumes, or deodorant.            Do not shave 48 hours prior to surgery.              Do not bring valuables to the hospital.            [facility] is not responsible for any belongings or valuables.  Do NOT Smoke (Tobacco/Vaping) or drink Alcohol 24 hours prior to your procedure If you use a CPAP at night, you may bring all equipment for your overnight stay.   Contacts, glasses, dentures or bridgework may not be worn into surgery.      For patients admitted to the hospital, discharge time will be determined by your treatment team.   Patients discharged the day of surgery will not be allowed to drive home, and someone needs to stay with them for 24 hours.    Special instructions:   [facility]- Preparing For Surgery  Before surgery, you can play an important role. Because skin is not sterile, your skin  needs to be as free of germs as possible. You can reduce the number of germs on your skin by washing with CHG (chlorahexidine gluconate) Soap before surgery.  CHG is an antiseptic cleaner which kills germs and bonds with the skin to continue killing germs even after washing.    Oral Hygiene is also important to reduce your risk of infection.  Remember - BRUSH YOUR TEETH THE MORNING OF SURGERY WITH YOUR REGULAR TOOTHPASTE  Please do not use if you have an allergy to CHG or antibacterial soaps. If your skin becomes reddened/irritated stop using the CHG.  Do not shave (including legs and underarms) for at least 48 hours prior to first CHG shower. It is OK to shave your face.  Please follow these instructions carefully.   1. Shower the NIGHT BEFORE SURGERY and the MORNING OF SURGERY with CHG Soap.   2. If you chose to wash your hair, wash your hair first as usual with your normal shampoo.  3. After you shampoo, rinse your hair and body thoroughly to remove the shampoo.  4. Use CHG as you would any other liquid soap. You can apply  CHG directly to the skin and wash gently with a scrungie or a clean washcloth.   5. Apply the CHG Soap to your body ONLY FROM THE NECK DOWN.  Do not use on open wounds or open sores. Avoid contact with your eyes, ears, mouth and genitals (private parts). Wash Face and genitals (private parts)  with your normal soap.   6. Wash thoroughly, paying special attention to the area where your surgery will be performed.  7. Thoroughly rinse your body with warm water from the neck down.  8. DO NOT shower/wash with your normal soap after using and rinsing off the CHG Soap.  9. Pat yourself dry with a CLEAN TOWEL.  10. Wear CLEAN PAJAMAS to bed the night before surgery  11. Place CLEAN SHEETS on your bed the night of your first shower and DO NOT SLEEP WITH PETS.   Day of Surgery: Wear Clean/Comfortable clothing the morning of surgery Do not apply any deodorants/lotions.    Remember to brush your teeth WITH YOUR REGULAR TOOTHPASTE.   Please read over the following fact sheets that you were given.

## 2020-07-19 ENCOUNTER — Encounter (HOSPITAL_COMMUNITY)
Admission: RE | Admit: 2020-07-19 | Discharge: 2020-07-19 | Disposition: A | Discharge status: Home / Self Care | Payer: Medicare Other | Source: Ambulatory Visit | Attending: Thoracic Surgery (Cardiothoracic Vascular Surgery) | Admitting: Thoracic Surgery (Cardiothoracic Vascular Surgery)

## 2020-07-19 ENCOUNTER — Ambulatory Visit (HOSPITAL_COMMUNITY)
Admission: RE | Admit: 2020-07-19 | Discharge: 2020-07-19 | Disposition: A | Discharge status: Home / Self Care | Payer: Medicare Other | Source: Ambulatory Visit | Attending: Thoracic Surgery (Cardiothoracic Vascular Surgery) | Admitting: Thoracic Surgery (Cardiothoracic Vascular Surgery)

## 2020-07-19 ENCOUNTER — Encounter (HOSPITAL_COMMUNITY): Payer: Self-pay

## 2020-07-19 ENCOUNTER — Other Ambulatory Visit (HOSPITAL_COMMUNITY)
Admission: RE | Admit: 2020-07-19 | Discharge: 2020-07-19 | Disposition: A | Discharge status: Home / Self Care | Payer: Medicare Other | Source: Ambulatory Visit | Attending: Thoracic Surgery (Cardiothoracic Vascular Surgery) | Admitting: Thoracic Surgery (Cardiothoracic Vascular Surgery)

## 2020-07-19 ENCOUNTER — Other Ambulatory Visit: Payer: Self-pay

## 2020-07-19 DIAGNOSIS — Z923 Personal history of irradiation: Secondary | ICD-10-CM | POA: Diagnosis not present

## 2020-07-19 DIAGNOSIS — Z79899 Other long term (current) drug therapy: Secondary | ICD-10-CM | POA: Diagnosis not present

## 2020-07-19 DIAGNOSIS — Z8673 Personal history of transient ischemic attack (TIA), and cerebral infarction without residual deficits: Secondary | ICD-10-CM | POA: Diagnosis not present

## 2020-07-19 DIAGNOSIS — Z7902 Long term (current) use of antithrombotics/antiplatelets: Secondary | ICD-10-CM | POA: Diagnosis not present

## 2020-07-19 DIAGNOSIS — J849 Interstitial pulmonary disease, unspecified: Secondary | ICD-10-CM

## 2020-07-19 DIAGNOSIS — E042 Nontoxic multinodular goiter: Secondary | ICD-10-CM | POA: Diagnosis not present

## 2020-07-19 DIAGNOSIS — E785 Hyperlipidemia, unspecified: Secondary | ICD-10-CM | POA: Diagnosis not present

## 2020-07-19 DIAGNOSIS — Z853 Personal history of malignant neoplasm of breast: Secondary | ICD-10-CM | POA: Diagnosis not present

## 2020-07-19 DIAGNOSIS — Z20822 Contact with and (suspected) exposure to covid-19: Secondary | ICD-10-CM | POA: Diagnosis not present

## 2020-07-19 DIAGNOSIS — K219 Gastro-esophageal reflux disease without esophagitis: Secondary | ICD-10-CM | POA: Diagnosis not present

## 2020-07-19 DIAGNOSIS — R0600 Dyspnea, unspecified: Secondary | ICD-10-CM | POA: Diagnosis not present

## 2020-07-19 DIAGNOSIS — N189 Chronic kidney disease, unspecified: Secondary | ICD-10-CM | POA: Diagnosis not present

## 2020-07-19 DIAGNOSIS — K449 Diaphragmatic hernia without obstruction or gangrene: Secondary | ICD-10-CM | POA: Diagnosis not present

## 2020-07-19 DIAGNOSIS — Z01818 Encounter for other preprocedural examination: Secondary | ICD-10-CM | POA: Insufficient documentation

## 2020-07-19 LAB — URINALYSIS, ROUTINE W REFLEX MICROSCOPIC
Bilirubin Urine: NEGATIVE
Glucose, UA: NEGATIVE mg/dL
Hgb urine dipstick: NEGATIVE
Ketones, ur: NEGATIVE mg/dL
Nitrite: NEGATIVE
Protein, ur: 30 mg/dL — AB
Specific Gravity, Urine: 1.013 (ref 1.005–1.030)
pH: 5 (ref 5.0–8.0)

## 2020-07-19 LAB — BLOOD GAS, ARTERIAL
Acid-base deficit: 6.3 mmol/L — ABNORMAL HIGH (ref 0.0–2.0)
Bicarbonate: 18 mmol/L — ABNORMAL LOW (ref 20.0–28.0)
Drawn by: 421801
FIO2: 21
O2 Saturation: 97.7 %
Patient temperature: 37
pCO2 arterial: 31.7 mmHg — ABNORMAL LOW (ref 32.0–48.0)
pH, Arterial: 7.372 (ref 7.350–7.450)
pO2, Arterial: 121 mmHg — ABNORMAL HIGH (ref 83.0–108.0)

## 2020-07-19 LAB — CBC
HCT: 42.3 % (ref 36.0–46.0)
Hemoglobin: 13.3 g/dL (ref 12.0–15.0)
MCH: 25.6 pg — ABNORMAL LOW (ref 26.0–34.0)
MCHC: 31.4 g/dL (ref 30.0–36.0)
MCV: 81.3 fL (ref 80.0–100.0)
Platelets: 327 10*3/uL (ref 150–400)
RBC: 5.2 MIL/uL — ABNORMAL HIGH (ref 3.87–5.11)
RDW: 15.1 % (ref 11.5–15.5)
WBC: 10.4 10*3/uL (ref 4.0–10.5)
nRBC: 0 % (ref 0.0–0.2)

## 2020-07-19 LAB — SARS CORONAVIRUS 2 (TAT 6-24 HRS): SARS Coronavirus 2: NEGATIVE

## 2020-07-19 LAB — TYPE AND SCREEN
ABO/RH(D): A POS
Antibody Screen: NEGATIVE

## 2020-07-19 LAB — COMPREHENSIVE METABOLIC PANEL
ALT: 19 U/L (ref 0–44)
AST: 19 U/L (ref 15–41)
Albumin: 4.1 g/dL (ref 3.5–5.0)
Alkaline Phosphatase: 66 U/L (ref 38–126)
Anion gap: 13 (ref 5–15)
BUN: 16 mg/dL (ref 8–23)
CO2: 16 mmol/L — ABNORMAL LOW (ref 22–32)
Calcium: 8.9 mg/dL (ref 8.9–10.3)
Chloride: 110 mmol/L (ref 98–111)
Creatinine, Ser: 1.15 mg/dL — ABNORMAL HIGH (ref 0.44–1.00)
GFR calc Af Amer: 54 mL/min — ABNORMAL LOW (ref >60)
GFR calc non Af Amer: 47 mL/min — ABNORMAL LOW (ref >60)
Glucose, Bld: 142 mg/dL — ABNORMAL HIGH (ref 70–99)
Potassium: 3.8 mmol/L (ref 3.5–5.1)
Sodium: 139 mmol/L (ref 135–145)
Total Bilirubin: 0.5 mg/dL (ref 0.3–1.2)
Total Protein: 6.6 g/dL (ref 6.5–8.1)

## 2020-07-19 LAB — PROTIME-INR
INR: 0.9 (ref 0.8–1.2)
Prothrombin Time: 11.6 seconds (ref 11.4–15.2)

## 2020-07-19 LAB — SURGICAL PCR SCREEN
MRSA, PCR: NEGATIVE
Staphylococcus aureus: NEGATIVE

## 2020-07-19 LAB — APTT: aPTT: 28 seconds (ref 24–36)

## 2020-07-19 IMAGING — CR DG CHEST 2V
2 series · 2 of 2 positions shown · non-contrast
Comparison: [DATE]

CLINICAL DATA: Interstitial lung disease.  Dyspnea

EXAM:
CHEST - 2 VIEW

[w chest pa]
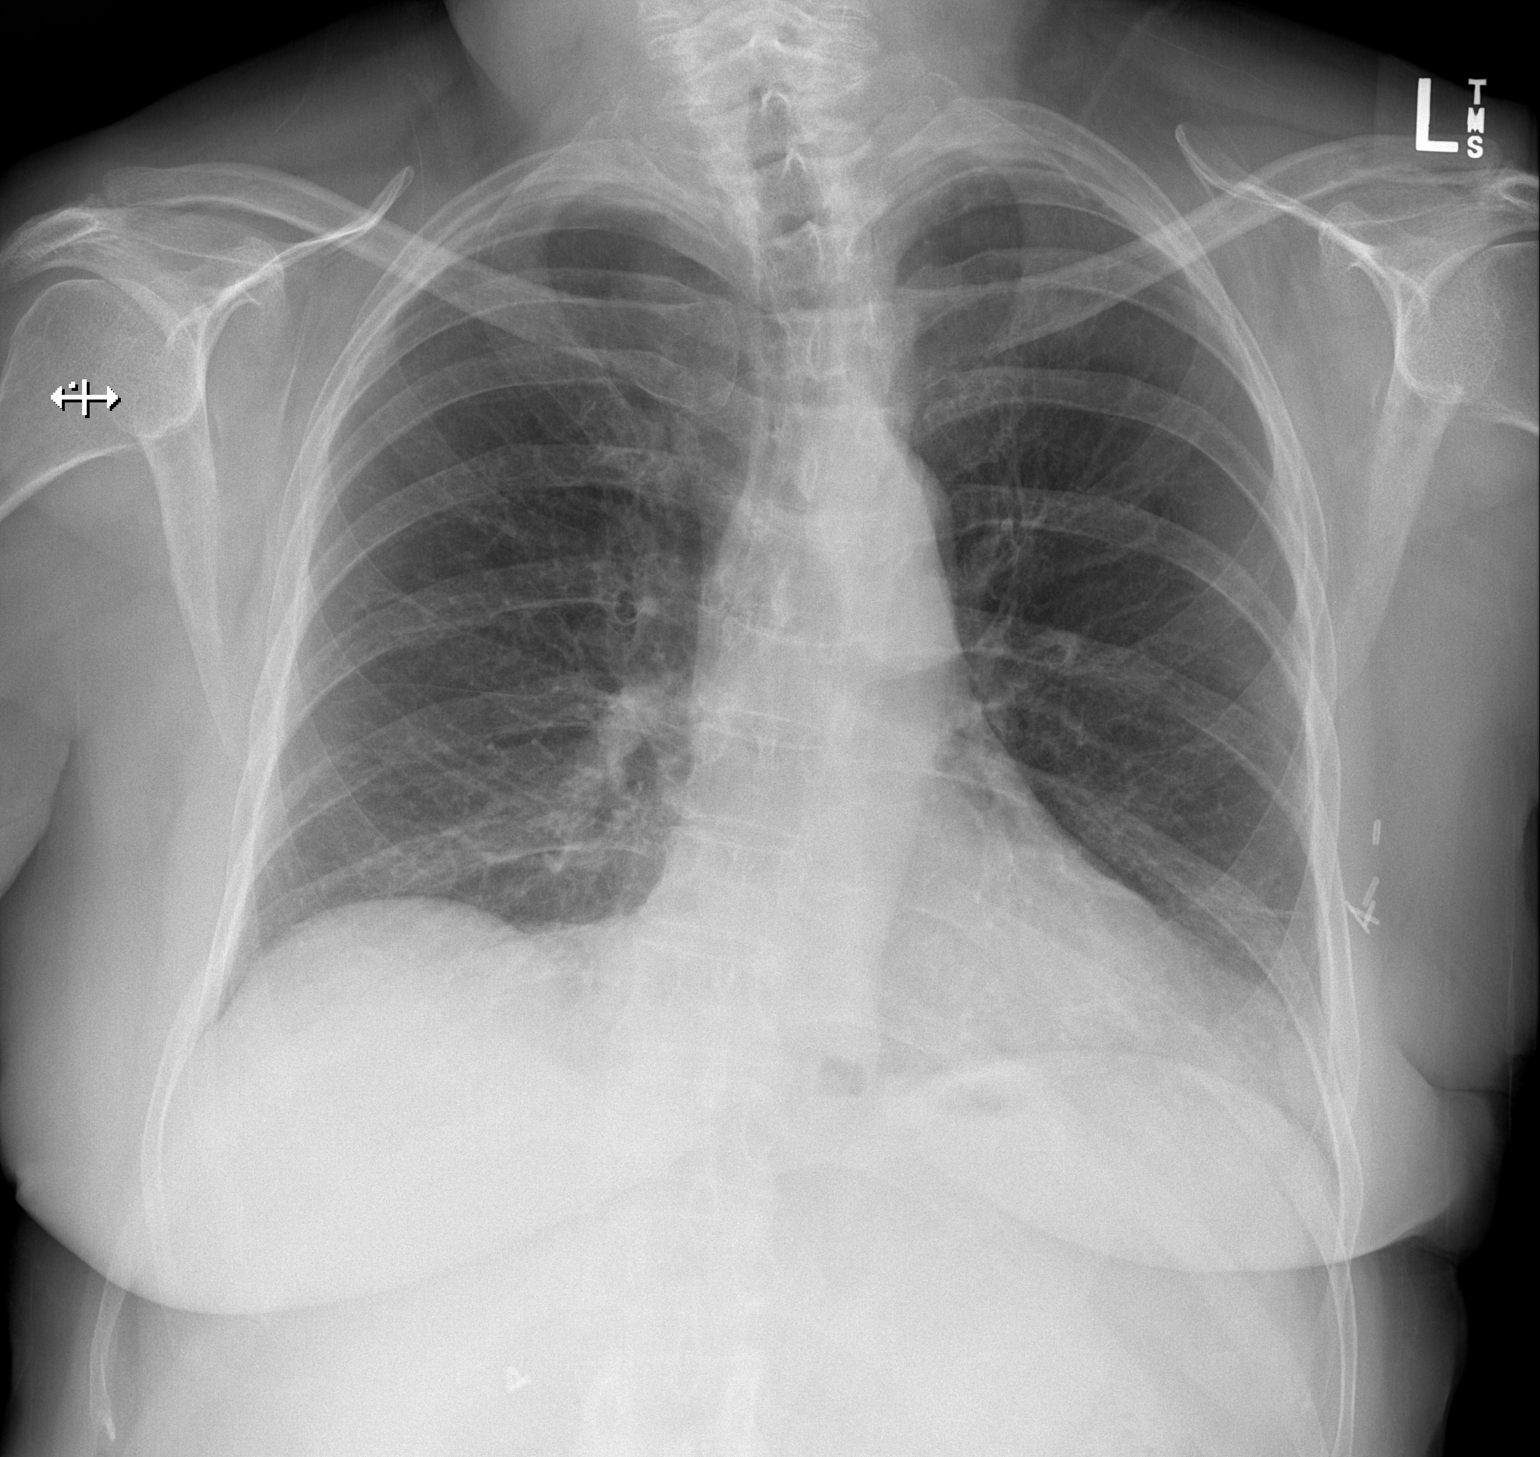

[w chest lat]
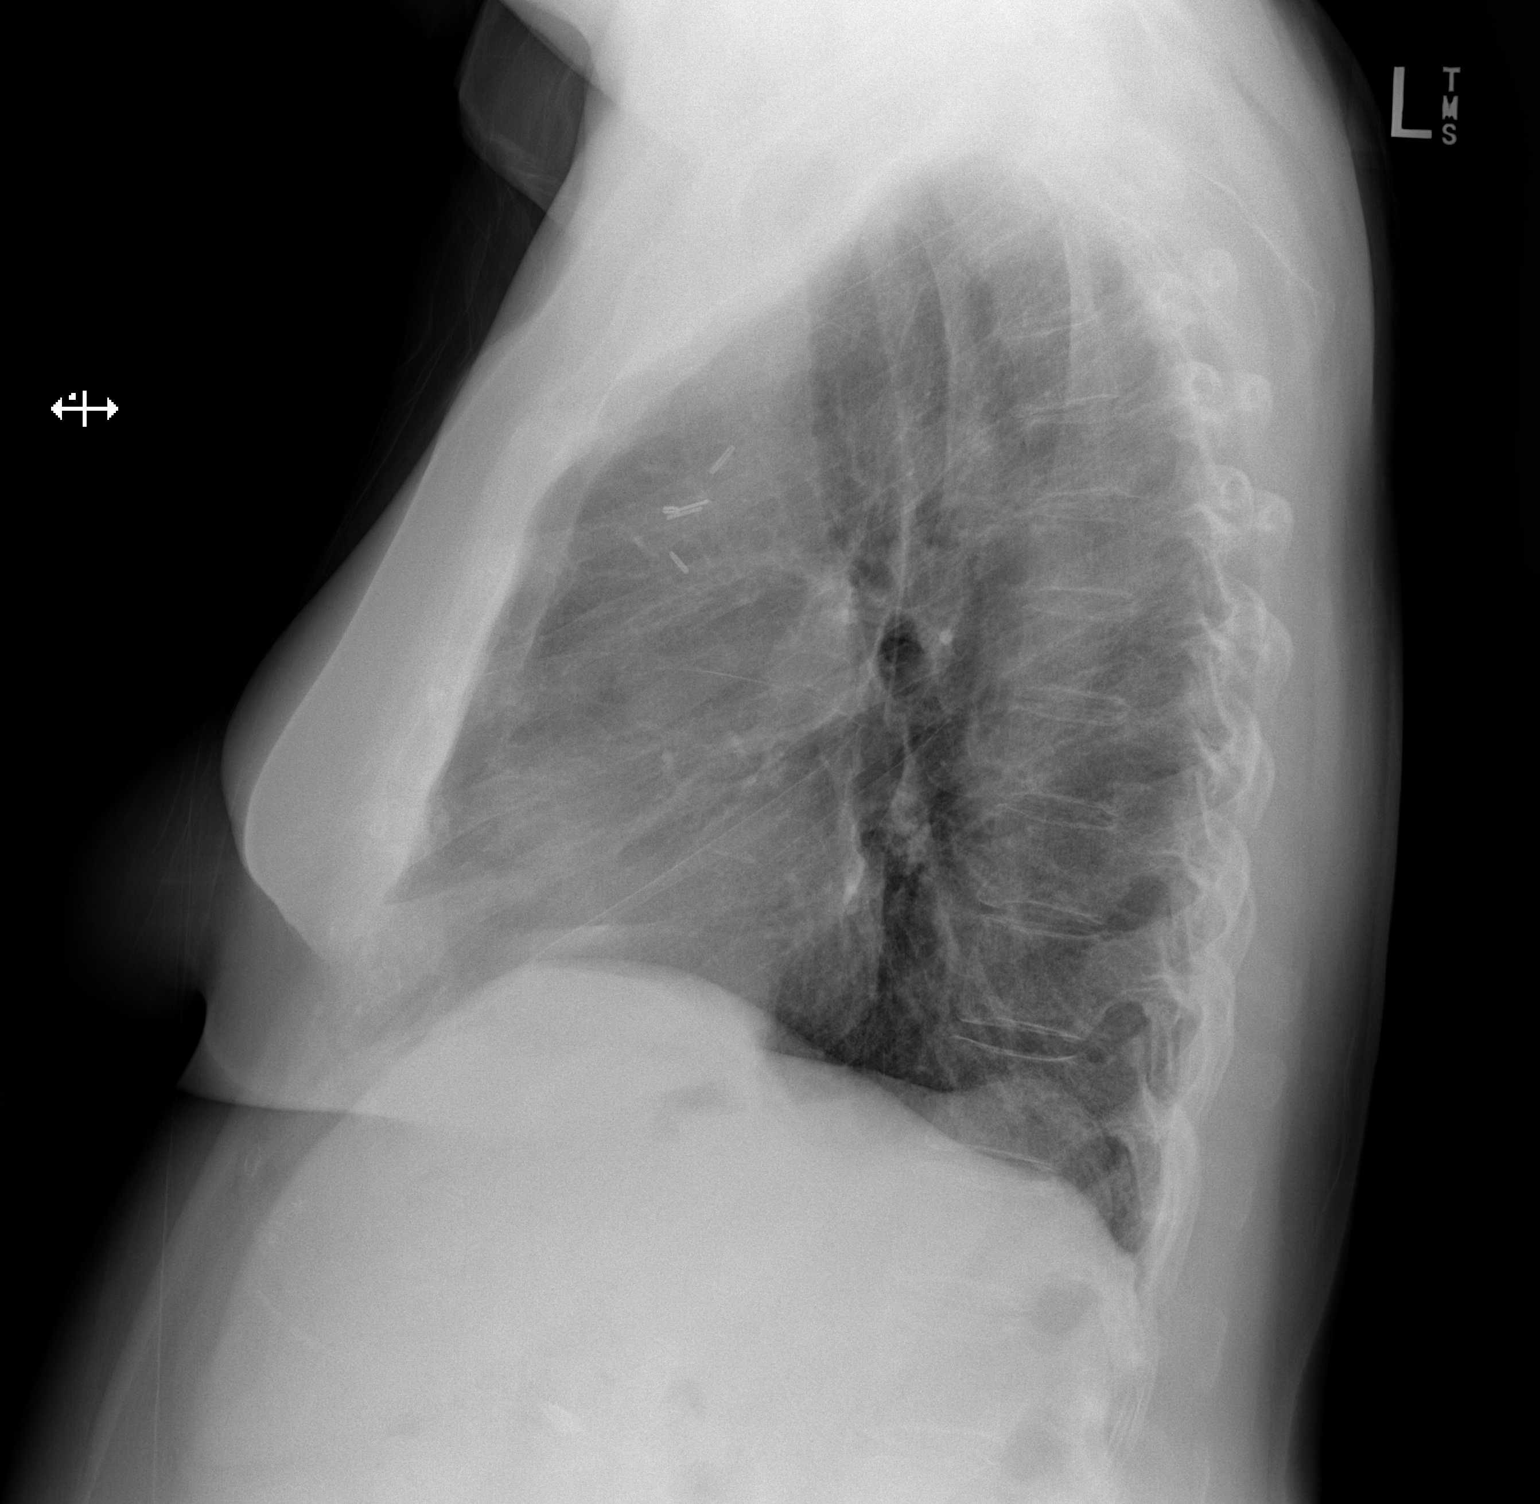

[2 of 2 positions shown; findings below may reference images not displayed]

FINDINGS: The heart size and mediastinal contours are within normal limits.
Mild atherosclerotic calcification of the aortic knob. No focal
airspace consolidation, pleural effusion, or pneumothorax. The
visualized skeletal structures are unremarkable.
IMPRESSION: No active cardiopulmonary disease.

## 2020-07-19 NOTE — Progress Notes (Signed)
Left message for Levonne Spiller about Yvonne Jordan's urine sample

## 2020-07-19 NOTE — Progress Notes (Signed)
PCP - Antony Contras Cardiologist - Dr Harrell Gave   Chest x-ray - 07/19/20 EKG - 07/19/20 Stress Test - 2014 ECHO -  Cardiac Cath - n/a   Blood Thinner Instructions: Plavix LD 07/14/20 Aspirin Instructions: n/a   COVID TEST- 07/19/20 collected   Anesthesia review: yes  Patient denies shortness of breath, fever, cough and chest pain at PAT appointment   All instructions explained to the patient, with a verbal understanding of the material. Patient agrees to go over the instructions while at home for a better understanding. Patient also instructed to self quarantine after being tested for COVID-19. The opportunity to ask questions was provided.

## 2020-07-20 ENCOUNTER — Telehealth: Payer: Self-pay

## 2020-07-20 NOTE — Progress Notes (Signed)
Anesthesia Chart Review:  Recent cardiac eval for chest pain. Coronary CTA 05/07/19 without CAD, calcium score 0. Last seen by Dr. Harrell Gave 02/25/20, chest pain had resolved, recommended continue risk factor modification and followup in 1 year.  Patient follows with pulmonology for history of dyspnea and unspecified interstitial lung disease.  She was recently referred to cardiothoracic surgery for lung biopsy to aid in diagnosis.  History of TIA on clopidogrel and statin, managed by PCP.  Patient reports last dose Plavix 07/14/2020.  Preop labs reviewed, unremarkable.   EKG 07/19/20: Normal sinus rhythm. Rate 93. Left axis deviation. Moderate voltage criteria for LVH, may be normal variant ( R in aVL , Cornell product ). Nonspecific ST and T wave abnormality  PFT 07/05/2020:  FVC-%Pred-Pre Latest Units: % 83  FEV1-%Pred-Pre Latest Units: % 95  FEV1FVC-%Pred-Pre Latest Units: % 115  TLC % pred Latest Units: % 86  RV % pred Latest Units: % 76  DLCO unc % pred Latest Units: % 84    CT Chest 06/17/20: IMPRESSION: 1. Moderate patchy air trapping in both lungs, indicative of small airways disease. Mild patchy ground-glass opacity and minimal traction bronchiolectasis with a dependent lower lobe predominance. No honeycombing. Findings are suggestive of a mild interstitial lung disease such as nonspecific interstitial pneumonia (NSIP) or chronic hypersensitivity pneumonitis. Findings are suggestive of an alternative diagnosis (not UIP) per consensus guidelines: Diagnosis of Idiopathic Pulmonary Fibrosis: An Official Clinical Practice Guideline. [place], 5, -, 19 2018. 2. Small hiatal hernia. 3. Multiple bilateral hypodense thyroid nodules, largest 2.5 cm on the right. Recommend thyroid US (ref: J Am Coll Radiol. 2015 Feb;12(2): 143-50). 4. Aortic Atherosclerosis (ICD10-I70.0).   CT Coronary morphology 05/07/19: IMPRESSION: 1. Coronary calcium score of 0. This was 0  percentile for age and sex matched control.  2. Normal coronary origin with right dominance.  3. No evidence of CAD.  Wynonia Musty [facility] Short Stay Center/Anesthesiology Phone [phone] 07/20/2020 9:28 AM

## 2020-07-20 NOTE — Anesthesia Preprocedure Evaluation (Addendum)
Anesthesia Evaluation  Patient identified by MRN, date of birth, ID band Patient awake    Reviewed: Allergy & Precautions, NPO status   Airway Mallampati: II  TM Distance: >3 FB     Dental   Pulmonary shortness of breath,    breath sounds clear to auscultation       Cardiovascular negative cardio ROS   Rhythm:Regular Rate:Normal     Neuro/Psych TIA   GI/Hepatic Neg liver ROS, GERD  ,  Endo/Other    Renal/GU Renal disease     Musculoskeletal   Abdominal   Peds  Hematology   Anesthesia Other Findings   Reproductive/Obstetrics                            Anesthesia Physical Anesthesia Plan  ASA: III  Anesthesia Plan: General   Post-op Pain Management:    Induction: Intravenous  PONV Risk Score and Plan: 3 and Ondansetron, Dexamethasone and Midazolam  Airway Management Planned: Double Lumen EBT  Additional Equipment: Arterial line  Intra-op Plan:   Post-operative Plan: Possible Post-op intubation/ventilation  Informed Consent: I have reviewed the patients History and Physical, chart, labs and discussed the procedure including the risks, benefits and alternatives for the proposed anesthesia with the patient or authorized representative who has indicated his/her understanding and acceptance.     Dental advisory given  Plan Discussed with: CRNA and Anesthesiologist  Anesthesia Plan Comments: (Recent cardiac eval for chest pain. Coronary CTA 05/07/19 without CAD, calcium score 0. Last seen by Dr. Harrell Gave 02/25/20, chest pain had resolved, recommended continue risk factor modification and followup in 1 year.  Patient follows with pulmonology for history of dyspnea and unspecified interstitial lung disease.  She was recently referred to cardiothoracic surgery for lung biopsy to aid in diagnosis.  History of TIA on clopidogrel and statin, managed by PCP.  Patient reports last dose  Plavix 07/14/2020.  Preop labs reviewed, unremarkable.   EKG 07/19/20: Normal sinus rhythm. Rate 93. Left axis deviation. Moderate voltage criteria for LVH, may be normal variant ( R in aVL , Cornell product ). Nonspecific ST and T wave abnormality  PFT 07/05/2020:  FVC-%Pred-Pre Latest Units: % 83 FEV1-%Pred-Pre Latest Units: % 95 FEV1FVC-%Pred-Pre Latest Units: % 115 TLC % pred Latest Units: % 86 RV % pred Latest Units: % 76 DLCO unc % pred Latest Units: % 84   CT Chest 06/17/20: IMPRESSION: 1. Moderate patchy air trapping in both lungs, indicative of small airways disease. Mild patchy ground-glass opacity and minimal traction bronchiolectasis with a dependent lower lobe predominance. No honeycombing. Findings are suggestive of a mild interstitial lung disease such as nonspecific interstitial pneumonia (NSIP) or chronic hypersensitivity pneumonitis. Findings are suggestive of an alternative diagnosis (not UIP) per consensus guidelines: Diagnosis of Idiopathic Pulmonary Fibrosis: An Official Clinical Practice Guideline. [place], 5, -, 19 2018. 2. Small hiatal hernia. 3. Multiple bilateral hypodense thyroid nodules, largest 2.5 cm on the right. Recommend thyroid US (ref: J Am Coll Radiol. 2015 Feb;12(2): 143-50). 4. Aortic Atherosclerosis (ICD10-I70.0).   CT Coronary morphology 05/07/19: IMPRESSION: 1. Coronary calcium score of 0. This was 0 percentile for age and sex matched control.  2. Normal coronary origin with right dominance.  3. No evidence of CAD.)     Anesthesia Quick Evaluation

## 2020-07-20 NOTE — Telephone Encounter (Signed)
TC to pt per Dr. Kipp Brood (per Kem Parkinson, RN) to assess condition d/t abnormal UA results. She is scheduled for surgery tomorrow. Pt denies urinary frequency, dysuria, fever, flank pain. She states she has no c/o and "no symptoms whatsoever." R. Rolena Infante, Licensed conveyancer made aware.

## 2020-07-21 ENCOUNTER — Inpatient Hospital Stay (HOSPITAL_COMMUNITY): Payer: Medicare Other

## 2020-07-21 ENCOUNTER — Inpatient Hospital Stay (HOSPITAL_COMMUNITY): Payer: Medicare Other | Admitting: Physician Assistant

## 2020-07-21 ENCOUNTER — Other Ambulatory Visit: Payer: Self-pay

## 2020-07-21 ENCOUNTER — Encounter (HOSPITAL_COMMUNITY): Payer: Self-pay | Admitting: Thoracic Surgery (Cardiothoracic Vascular Surgery)

## 2020-07-21 ENCOUNTER — Inpatient Hospital Stay (HOSPITAL_COMMUNITY): Payer: Medicare Other | Admitting: Certified Registered"

## 2020-07-21 ENCOUNTER — Inpatient Hospital Stay (HOSPITAL_COMMUNITY)
Admission: RE | Admit: 2020-07-21 | Discharge: 2020-07-22 | DRG: 165 | Disposition: A | Discharge status: Home Health | Payer: Medicare Other | Attending: Thoracic Surgery (Cardiothoracic Vascular Surgery) | Admitting: Thoracic Surgery (Cardiothoracic Vascular Surgery)

## 2020-07-21 ENCOUNTER — Encounter (HOSPITAL_COMMUNITY)
Admission: RE | Disposition: A | Discharge status: Home Health | Payer: Self-pay | Source: Ambulatory Visit | Attending: Thoracic Surgery (Cardiothoracic Vascular Surgery)

## 2020-07-21 DIAGNOSIS — Z4682 Encounter for fitting and adjustment of non-vascular catheter: Secondary | ICD-10-CM

## 2020-07-21 DIAGNOSIS — Z7902 Long term (current) use of antithrombotics/antiplatelets: Secondary | ICD-10-CM

## 2020-07-21 DIAGNOSIS — E559 Vitamin D deficiency, unspecified: Secondary | ICD-10-CM | POA: Diagnosis present

## 2020-07-21 DIAGNOSIS — Z923 Personal history of irradiation: Secondary | ICD-10-CM | POA: Diagnosis not present

## 2020-07-21 DIAGNOSIS — Z20822 Contact with and (suspected) exposure to covid-19: Secondary | ICD-10-CM | POA: Diagnosis present

## 2020-07-21 DIAGNOSIS — J849 Interstitial pulmonary disease, unspecified: Secondary | ICD-10-CM | POA: Diagnosis not present

## 2020-07-21 DIAGNOSIS — J939 Pneumothorax, unspecified: Secondary | ICD-10-CM | POA: Diagnosis not present

## 2020-07-21 DIAGNOSIS — Z87891 Personal history of nicotine dependence: Secondary | ICD-10-CM | POA: Diagnosis not present

## 2020-07-21 DIAGNOSIS — Z8673 Personal history of transient ischemic attack (TIA), and cerebral infarction without residual deficits: Secondary | ICD-10-CM | POA: Diagnosis not present

## 2020-07-21 DIAGNOSIS — E042 Nontoxic multinodular goiter: Secondary | ICD-10-CM | POA: Diagnosis present

## 2020-07-21 DIAGNOSIS — J8410 Pulmonary fibrosis, unspecified: Secondary | ICD-10-CM | POA: Diagnosis not present

## 2020-07-21 DIAGNOSIS — Z853 Personal history of malignant neoplasm of breast: Secondary | ICD-10-CM | POA: Diagnosis not present

## 2020-07-21 DIAGNOSIS — Z79899 Other long term (current) drug therapy: Secondary | ICD-10-CM

## 2020-07-21 DIAGNOSIS — K219 Gastro-esophageal reflux disease without esophagitis: Secondary | ICD-10-CM | POA: Diagnosis present

## 2020-07-21 DIAGNOSIS — G459 Transient cerebral ischemic attack, unspecified: Secondary | ICD-10-CM | POA: Diagnosis not present

## 2020-07-21 DIAGNOSIS — N189 Chronic kidney disease, unspecified: Secondary | ICD-10-CM | POA: Diagnosis present

## 2020-07-21 DIAGNOSIS — K449 Diaphragmatic hernia without obstruction or gangrene: Secondary | ICD-10-CM | POA: Diagnosis present

## 2020-07-21 DIAGNOSIS — D649 Anemia, unspecified: Secondary | ICD-10-CM | POA: Diagnosis present

## 2020-07-21 DIAGNOSIS — E785 Hyperlipidemia, unspecified: Secondary | ICD-10-CM | POA: Diagnosis present

## 2020-07-21 DIAGNOSIS — J9811 Atelectasis: Secondary | ICD-10-CM | POA: Diagnosis not present

## 2020-07-21 LAB — ABO/RH: ABO/RH(D): A POS

## 2020-07-21 SURGERY — WEDGE RESECTION, LUNG, ROBOT-ASSISTED, THORACOSCOPIC
Anesthesia: General | Site: Chest | Laterality: Right

## 2020-07-21 MED ORDER — LUNG SURGERY BOOK
Freq: Once | Status: AC
  Filled 2020-07-21: qty 1

## 2020-07-21 MED ORDER — SODIUM CHLORIDE 0.9 % IV SOLN: INTRAVENOUS | Status: DC | PRN

## 2020-07-21 MED ORDER — ROCURONIUM BROMIDE 10 MG/ML (PF) SYRINGE
PREFILLED_SYRINGE | INTRAVENOUS | Status: DC | PRN
  Administered 2020-07-21: 50 mg via INTRAVENOUS
  Administered 2020-07-21: 10 mg via INTRAVENOUS

## 2020-07-21 MED ORDER — ACETAMINOPHEN 160 MG/5ML PO SOLN: 1000.0000 mg | Freq: Four times a day (QID) | ORAL | Status: DC

## 2020-07-21 MED ORDER — HYDROMORPHONE HCL 1 MG/ML IJ SOLN
INTRAMUSCULAR | Status: AC
  Filled 2020-07-21: qty 1

## 2020-07-21 MED ORDER — FENTANYL CITRATE (PF) 250 MCG/5ML IJ SOLN
INTRAMUSCULAR | Status: AC
  Filled 2020-07-21: qty 5

## 2020-07-21 MED ORDER — HEMOSTATIC AGENTS (NO CHARGE) OPTIME
TOPICAL | Status: DC | PRN
  Administered 2020-07-21: 2 via TOPICAL

## 2020-07-21 MED ORDER — MULTIVITAMINS PO CAPS: 1.0000 | ORAL_CAPSULE | Freq: Every day | ORAL | Status: DC

## 2020-07-21 MED ORDER — SENNOSIDES-DOCUSATE SODIUM 8.6-50 MG PO TABS
1.0000 | ORAL_TABLET | Freq: Every day | ORAL | Status: DC
  Administered 2020-07-21: 1 via ORAL
  Filled 2020-07-21: qty 1

## 2020-07-21 MED ORDER — ROCURONIUM BROMIDE 10 MG/ML (PF) SYRINGE
PREFILLED_SYRINGE | INTRAVENOUS | Status: AC
  Filled 2020-07-21: qty 10

## 2020-07-21 MED ORDER — CEFAZOLIN SODIUM-DEXTROSE 2-4 GM/100ML-% IV SOLN
2.0000 g | INTRAVENOUS | Status: AC
  Administered 2020-07-21: 2 g via INTRAVENOUS
  Filled 2020-07-21: qty 100

## 2020-07-21 MED ORDER — KETOROLAC TROMETHAMINE 15 MG/ML IJ SOLN
15.0000 mg | Freq: Four times a day (QID) | INTRAMUSCULAR | Status: DC
  Administered 2020-07-21 – 2020-07-22 (×4): 15 mg via INTRAVENOUS
  Filled 2020-07-21 (×3): qty 1

## 2020-07-21 MED ORDER — SODIUM CHLORIDE FLUSH 0.9 % IV SOLN
INTRAVENOUS | Status: DC | PRN
  Administered 2020-07-21: 100 mL

## 2020-07-21 MED ORDER — SUGAMMADEX SODIUM 200 MG/2ML IV SOLN
INTRAVENOUS | Status: DC | PRN
  Administered 2020-07-21: 200 mg via INTRAVENOUS

## 2020-07-21 MED ORDER — HYDROMORPHONE HCL 1 MG/ML IJ SOLN
INTRAMUSCULAR | Status: AC
  Administered 2020-07-21: 0.5 mg via INTRAVENOUS
  Filled 2020-07-21: qty 1

## 2020-07-21 MED ORDER — ONDANSETRON HCL 4 MG/2ML IJ SOLN: 4.0000 mg | Freq: Four times a day (QID) | INTRAMUSCULAR | Status: DC | PRN

## 2020-07-21 MED ORDER — EPHEDRINE 5 MG/ML INJ
INTRAVENOUS | Status: AC
  Filled 2020-07-21: qty 10

## 2020-07-21 MED ORDER — FENTANYL CITRATE (PF) 100 MCG/2ML IJ SOLN
INTRAMUSCULAR | Status: DC | PRN
  Administered 2020-07-21 (×6): 50 ug via INTRAVENOUS

## 2020-07-21 MED ORDER — BISACODYL 5 MG PO TBEC
10.0000 mg | DELAYED_RELEASE_TABLET | Freq: Every day | ORAL | Status: DC
  Filled 2020-07-21: qty 2

## 2020-07-21 MED ORDER — ADULT MULTIVITAMIN W/MINERALS CH
1.0000 | ORAL_TABLET | Freq: Every day | ORAL | Status: DC
  Administered 2020-07-22: 1 via ORAL
  Filled 2020-07-21: qty 1

## 2020-07-21 MED ORDER — ONDANSETRON HCL 4 MG/2ML IJ SOLN
INTRAMUSCULAR | Status: DC | PRN
  Administered 2020-07-21: 4 mg via INTRAVENOUS

## 2020-07-21 MED ORDER — GLYCOPYRROLATE PF 0.2 MG/ML IJ SOSY
PREFILLED_SYRINGE | INTRAMUSCULAR | Status: AC
  Filled 2020-07-21: qty 1

## 2020-07-21 MED ORDER — EPHEDRINE SULFATE-NACL 50-0.9 MG/10ML-% IV SOSY
PREFILLED_SYRINGE | INTRAVENOUS | Status: DC | PRN
  Administered 2020-07-21: 5 mg via INTRAVENOUS

## 2020-07-21 MED ORDER — GLYCOPYRROLATE PF 0.2 MG/ML IJ SOSY
PREFILLED_SYRINGE | INTRAMUSCULAR | Status: DC | PRN
  Administered 2020-07-21: .1 mg via INTRAVENOUS

## 2020-07-21 MED ORDER — DEXAMETHASONE SODIUM PHOSPHATE 10 MG/ML IJ SOLN
INTRAMUSCULAR | Status: DC | PRN
  Administered 2020-07-21: 10 mg via INTRAVENOUS

## 2020-07-21 MED ORDER — MIDAZOLAM HCL 5 MG/5ML IJ SOLN
INTRAMUSCULAR | Status: DC | PRN
  Administered 2020-07-21: 2 mg via INTRAVENOUS

## 2020-07-21 MED ORDER — PANTOPRAZOLE SODIUM 40 MG PO TBEC
40.0000 mg | DELAYED_RELEASE_TABLET | Freq: Every day | ORAL | Status: DC
  Administered 2020-07-22: 40 mg via ORAL
  Filled 2020-07-21: qty 1

## 2020-07-21 MED ORDER — CHLORHEXIDINE GLUCONATE 0.12 % MT SOLN
15.0000 mL | Freq: Once | OROMUCOSAL | Status: AC
  Administered 2020-07-21: 15 mL via OROMUCOSAL
  Filled 2020-07-21: qty 15

## 2020-07-21 MED ORDER — LACTATED RINGERS IV SOLN: INTRAVENOUS | Status: DC

## 2020-07-21 MED ORDER — 0.9 % SODIUM CHLORIDE (POUR BTL) OPTIME
TOPICAL | Status: DC | PRN
  Administered 2020-07-21: 2000 mL

## 2020-07-21 MED ORDER — KETOROLAC TROMETHAMINE 15 MG/ML IJ SOLN
INTRAMUSCULAR | Status: AC
  Filled 2020-07-21: qty 1

## 2020-07-21 MED ORDER — MIDAZOLAM HCL 2 MG/2ML IJ SOLN
INTRAMUSCULAR | Status: AC
  Filled 2020-07-21: qty 2

## 2020-07-21 MED ORDER — PROPOFOL 10 MG/ML IV BOLUS
INTRAVENOUS | Status: AC
  Filled 2020-07-21: qty 20

## 2020-07-21 MED ORDER — OXYCODONE HCL 5 MG PO TABS
5.0000 mg | ORAL_TABLET | ORAL | Status: DC | PRN
  Administered 2020-07-21 (×2): 5 mg via ORAL
  Filled 2020-07-21: qty 1

## 2020-07-21 MED ORDER — BUPIVACAINE LIPOSOME 1.3 % IJ SUSP
20.0000 mL | Freq: Once | INTRAMUSCULAR | Status: DC
  Filled 2020-07-21: qty 20

## 2020-07-21 MED ORDER — HYDROMORPHONE HCL 1 MG/ML IJ SOLN
0.2500 mg | INTRAMUSCULAR | Status: DC | PRN
  Administered 2020-07-21 (×2): 0.5 mg via INTRAVENOUS

## 2020-07-21 MED ORDER — LIDOCAINE 2% (20 MG/ML) 5 ML SYRINGE
INTRAMUSCULAR | Status: AC
  Filled 2020-07-21: qty 5

## 2020-07-21 MED ORDER — PROPOFOL 10 MG/ML IV BOLUS
INTRAVENOUS | Status: DC | PRN
  Administered 2020-07-21: 30 mg via INTRAVENOUS
  Administered 2020-07-21: 130 mg via INTRAVENOUS

## 2020-07-21 MED ORDER — TRETINOIN 0.025 % EX CREA: 1.0000 "application " | TOPICAL_CREAM | Freq: Every day | CUTANEOUS | Status: DC

## 2020-07-21 MED ORDER — PHENYLEPHRINE 40 MCG/ML (10ML) SYRINGE FOR IV PUSH (FOR BLOOD PRESSURE SUPPORT)
PREFILLED_SYRINGE | INTRAVENOUS | Status: AC
  Filled 2020-07-21: qty 10

## 2020-07-21 MED ORDER — BUPIVACAINE HCL (PF) 0.5 % IJ SOLN
INTRAMUSCULAR | Status: AC
  Filled 2020-07-21: qty 30

## 2020-07-21 MED ORDER — LACTATED RINGERS IV SOLN: INTRAVENOUS | Status: DC | PRN

## 2020-07-21 MED ORDER — DEXAMETHASONE SODIUM PHOSPHATE 10 MG/ML IJ SOLN
INTRAMUSCULAR | Status: AC
  Filled 2020-07-21: qty 1

## 2020-07-21 MED ORDER — OXYCODONE HCL 5 MG PO TABS
ORAL_TABLET | ORAL | Status: AC
  Filled 2020-07-21: qty 1

## 2020-07-21 MED ORDER — CEFAZOLIN SODIUM-DEXTROSE 2-4 GM/100ML-% IV SOLN
2.0000 g | Freq: Three times a day (TID) | INTRAVENOUS | Status: AC
  Administered 2020-07-21 (×2): 2 g via INTRAVENOUS
  Filled 2020-07-21 (×2): qty 100

## 2020-07-21 MED ORDER — LIDOCAINE 2% (20 MG/ML) 5 ML SYRINGE
INTRAMUSCULAR | Status: DC | PRN
  Administered 2020-07-21 (×2): 50 mg via INTRAVENOUS

## 2020-07-21 MED ORDER — POTASSIUM CHLORIDE IN NACL 20-0.9 MEQ/L-% IV SOLN
INTRAVENOUS | Status: DC
  Filled 2020-07-21 (×2): qty 1000

## 2020-07-21 MED ORDER — PHENYLEPHRINE 40 MCG/ML (10ML) SYRINGE FOR IV PUSH (FOR BLOOD PRESSURE SUPPORT)
PREFILLED_SYRINGE | INTRAVENOUS | Status: DC | PRN
  Administered 2020-07-21: 40 ug via INTRAVENOUS
  Administered 2020-07-21 (×2): 80 ug via INTRAVENOUS

## 2020-07-21 MED ORDER — TRAMADOL HCL 50 MG PO TABS
50.0000 mg | ORAL_TABLET | Freq: Four times a day (QID) | ORAL | Status: DC | PRN
  Administered 2020-07-21: 50 mg via ORAL
  Filled 2020-07-21: qty 1

## 2020-07-21 MED ORDER — CLOPIDOGREL BISULFATE 75 MG PO TABS
75.0000 mg | ORAL_TABLET | Freq: Every day | ORAL | Status: DC
  Administered 2020-07-22: 75 mg via ORAL
  Filled 2020-07-21: qty 1

## 2020-07-21 MED ORDER — ONDANSETRON HCL 4 MG/2ML IJ SOLN
INTRAMUSCULAR | Status: AC
  Filled 2020-07-21: qty 2

## 2020-07-21 MED ORDER — ACETAMINOPHEN 500 MG PO TABS
1000.0000 mg | ORAL_TABLET | Freq: Four times a day (QID) | ORAL | Status: DC
  Administered 2020-07-21 – 2020-07-22 (×4): 1000 mg via ORAL
  Filled 2020-07-21 (×4): qty 2

## 2020-07-21 MED ORDER — ORAL CARE MOUTH RINSE: 15.0000 mL | Freq: Once | OROMUCOSAL | Status: AC

## 2020-07-21 SURGICAL SUPPLY — 104 items
BLADE CLIPPER SURG (BLADE) ×4 IMPLANT
BNDG COHESIVE 6X5 TAN STRL LF (GAUZE/BANDAGES/DRESSINGS) ×4 IMPLANT
CANISTER SUCT 3000ML PPV (MISCELLANEOUS) ×8 IMPLANT
CANNULA REDUC XI 12-8 STAPL (CANNULA) ×2
CANNULA REDUC XI 12-8MM STAPL (CANNULA) ×2
CANNULA REDUCER 12-8 DVNC XI (CANNULA) ×4 IMPLANT
CATH THORACIC 28FR (CATHETERS) IMPLANT
CATH THORACIC 28FR RT ANG (CATHETERS) IMPLANT
CATH THORACIC 36FR (CATHETERS) IMPLANT
CATH THORACIC 36FR RT ANG (CATHETERS) IMPLANT
CATH TROCAR 20FR (CATHETERS) IMPLANT
CHLORAPREP W/TINT 26 (MISCELLANEOUS) ×4 IMPLANT
CLIP VESOCCLUDE MED 6/CT (CLIP) IMPLANT
CNTNR URN SCR LID CUP LEK RST (MISCELLANEOUS) ×4 IMPLANT
CONN ST 1/4X3/8  BEN (MISCELLANEOUS)
CONN ST 1/4X3/8 BEN (MISCELLANEOUS) IMPLANT
CONT SPEC 4OZ STRL OR WHT (MISCELLANEOUS) ×4
COVER SURGICAL LIGHT HANDLE (MISCELLANEOUS) IMPLANT
DEFOGGER SCOPE WARMER CLEARIFY (MISCELLANEOUS) ×4 IMPLANT
DERMABOND ADVANCED (GAUZE/BANDAGES/DRESSINGS) ×2
DERMABOND ADVANCED .7 DNX12 (GAUZE/BANDAGES/DRESSINGS) ×2 IMPLANT
DRAIN CHANNEL 28F RND 3/8 FF (WOUND CARE) IMPLANT
DRAIN CHANNEL 32F RND 10.7 FF (WOUND CARE) IMPLANT
DRAPE ARM DVNC X/XI (DISPOSABLE) ×8 IMPLANT
DRAPE COLUMN DVNC XI (DISPOSABLE) ×2 IMPLANT
DRAPE CV SPLIT W-CLR ANES SCRN (DRAPES) ×4 IMPLANT
DRAPE DA VINCI XI ARM (DISPOSABLE) ×8
DRAPE DA VINCI XI COLUMN (DISPOSABLE) ×2
DRAPE INCISE IOBAN 66X45 STRL (DRAPES) ×4 IMPLANT
DRAPE ORTHO SPLIT 77X108 STRL (DRAPES) ×2
DRAPE SURG ORHT 6 SPLT 77X108 (DRAPES) ×2 IMPLANT
DRAPE WARM FLUID 44X44 (DRAPES) ×4 IMPLANT
ELECT BLADE 6.5 EXT (BLADE) IMPLANT
ELECT REM PT RETURN 9FT ADLT (ELECTROSURGICAL) ×4
ELECTRODE REM PT RTRN 9FT ADLT (ELECTROSURGICAL) ×2 IMPLANT
GAUZE KITTNER 4X5 RF (MISCELLANEOUS) ×4 IMPLANT
GAUZE SPONGE 4X4 12PLY STRL (GAUZE/BANDAGES/DRESSINGS) ×4 IMPLANT
GLOVE BIO SURGEON STRL SZ7.5 (GLOVE) ×8 IMPLANT
GOWN STRL REUS W/ TWL LRG LVL3 (GOWN DISPOSABLE) ×4 IMPLANT
GOWN STRL REUS W/ TWL XL LVL3 (GOWN DISPOSABLE) ×6 IMPLANT
GOWN STRL REUS W/TWL 2XL LVL3 (GOWN DISPOSABLE) ×4 IMPLANT
GOWN STRL REUS W/TWL LRG LVL3 (GOWN DISPOSABLE) ×4
GOWN STRL REUS W/TWL XL LVL3 (GOWN DISPOSABLE) ×6
HEMOSTAT SURGICEL 2X14 (HEMOSTASIS) ×12 IMPLANT
IRRIGATION STRYKERFLOW (MISCELLANEOUS) ×2 IMPLANT
IRRIGATOR STRYKERFLOW (MISCELLANEOUS) ×4
KIT BASIN OR (CUSTOM PROCEDURE TRAY) ×4 IMPLANT
KIT SUCTION CATH 14FR (SUCTIONS) IMPLANT
KIT TURNOVER KIT B (KITS) ×4 IMPLANT
LOOP VESSEL SUPERMAXI WHITE (MISCELLANEOUS) IMPLANT
NEEDLE 22X1 1/2 (OR ONLY) (NEEDLE) ×4 IMPLANT
NEEDLE HYPO 25GX1X1/2 BEV (NEEDLE) ×4 IMPLANT
NS IRRIG 1000ML POUR BTL (IV SOLUTION) ×12 IMPLANT
OBTURATOR OPTICAL STANDARD 8MM (TROCAR)
OBTURATOR OPTICAL STND 8 DVNC (TROCAR)
OBTURATOR OPTICALSTD 8 DVNC (TROCAR) IMPLANT
PACK CHEST (CUSTOM PROCEDURE TRAY) ×4 IMPLANT
PAD ARMBOARD 7.5X6 YLW CONV (MISCELLANEOUS) ×20 IMPLANT
RELOAD STAPLER 3.5X45 BLU DVNC (STAPLE) ×14 IMPLANT
SEAL CANN UNIV 5-8 DVNC XI (MISCELLANEOUS) ×4 IMPLANT
SEAL XI 5MM-8MM UNIVERSAL (MISCELLANEOUS) ×4
SEALANT PROGEL (MISCELLANEOUS) IMPLANT
SEALANT SURG COSEAL 4ML (VASCULAR PRODUCTS) IMPLANT
SEALANT SURG COSEAL 8ML (VASCULAR PRODUCTS) IMPLANT
SET TUBE SMOKE EVAC HIGH FLOW (TUBING) ×4 IMPLANT
SOLUTION ELECTROLUBE (MISCELLANEOUS) IMPLANT
SPONGE INTESTINAL PEANUT (DISPOSABLE) IMPLANT
STAPLER CANNULA SEAL DVNC XI (STAPLE) ×4 IMPLANT
STAPLER CANNULA SEAL XI (STAPLE) ×4
STAPLER RELOAD 3.5X45 BLU DVNC (STAPLE) ×14
STAPLER RELOAD 3.5X45 BLUE (STAPLE) ×14
STOPCOCK 4 WAY LG BORE MALE ST (IV SETS) ×4 IMPLANT
SUT MON AB 2-0 CT1 36 (SUTURE) IMPLANT
SUT PDS AB 1 CTX 36 (SUTURE) IMPLANT
SUT PROLENE 4 0 RB 1 (SUTURE)
SUT PROLENE 4-0 RB1 .5 CRCL 36 (SUTURE) IMPLANT
SUT SILK  1 MH (SUTURE) ×2
SUT SILK 1 MH (SUTURE) ×2 IMPLANT
SUT SILK 1 TIES 10X30 (SUTURE) IMPLANT
SUT SILK 2 0 SH (SUTURE) IMPLANT
SUT SILK 2 0SH CR/8 30 (SUTURE) IMPLANT
SUT VIC AB 1 CTX 36 (SUTURE)
SUT VIC AB 1 CTX36XBRD ANBCTR (SUTURE) IMPLANT
SUT VIC AB 2-0 CT1 27 (SUTURE) ×2
SUT VIC AB 2-0 CT1 TAPERPNT 27 (SUTURE) ×2 IMPLANT
SUT VIC AB 3-0 SH 27 (SUTURE) ×6
SUT VIC AB 3-0 SH 27X BRD (SUTURE) ×6 IMPLANT
SUT VICRYL 0 TIES 12 18 (SUTURE) ×4 IMPLANT
SUT VICRYL 0 UR6 27IN ABS (SUTURE) ×8 IMPLANT
SUT VICRYL 2 TP 1 (SUTURE) IMPLANT
SYR 10ML LL (SYRINGE) ×4 IMPLANT
SYR 20ML LL LF (SYRINGE) ×4 IMPLANT
SYR 50ML LL SCALE MARK (SYRINGE) ×4 IMPLANT
SYSTEM RETRIEVAL ANCHOR 8 (MISCELLANEOUS) ×4 IMPLANT
SYSTEM SAHARA CHEST DRAIN ATS (WOUND CARE) ×4 IMPLANT
TAPE CLOTH 4X10 WHT NS (GAUZE/BANDAGES/DRESSINGS) ×4 IMPLANT
TAPE CLOTH SURG 4X10 WHT LF (GAUZE/BANDAGES/DRESSINGS) ×4 IMPLANT
TIP APPLICATOR SPRAY EXTEND 16 (VASCULAR PRODUCTS) IMPLANT
TOWEL GREEN STERILE (TOWEL DISPOSABLE) ×4 IMPLANT
TRAY FOLEY MTR SLVR 16FR STAT (SET/KITS/TRAYS/PACK) ×4 IMPLANT
TROCAR BLADELESS 15MM (ENDOMECHANICALS) IMPLANT
TROCAR XCEL 12X100 BLDLESS (ENDOMECHANICALS) ×4 IMPLANT
TUBING EXTENTION W/L.L. (IV SETS) ×4 IMPLANT
WATER STERILE IRR 1000ML POUR (IV SOLUTION) ×4 IMPLANT

## 2020-07-21 NOTE — Anesthesia Postprocedure Evaluation (Signed)
Anesthesia Post Note  Patient: Yvonne Jordan  Procedure(s) Performed: XI ROBOTIC ASSISTED THORASCOPY-WEDGE RESECTION OF RIGHT LOWER, RIGHT UPPER AND RIGHT MIDDLE LOBES. (Right Chest)     Anesthesia Type: General Level of consciousness: awake Pain management: pain level controlled Vital Signs Assessment: post-procedure vital signs reviewed and stable Cardiovascular status: stable Postop Assessment: no apparent nausea or vomiting Anesthetic complications: no   No complications documented.  Last Vitals:  Vitals:   07/21/20 1100 07/21/20 1130  BP: (!) 148/61 (!) 142/61  Pulse: 71 55  Resp: 17 20  Temp:    SpO2: 100% 98%    Last Pain:  Vitals:   07/21/20 1130  TempSrc:   PainSc: Asleep                 Lovella Hardie

## 2020-07-21 NOTE — Brief Op Note (Signed)
07/21/2020  9:42 AM  PATIENT:  Yvonne Jordan  74 y.o. female  PRE-OPERATIVE DIAGNOSIS:  ILD  POST-OPERATIVE DIAGNOSIS:  * No post-op diagnosis entered *  PROCEDURE:  XI ROBOTIC ASSISTED THORASCOPY-WEDGE RESECTION OF RIGHT LOWER, RIGHT UPPER AND RIGHT MIDDLE LOBES and INTERCOSTAL NERVE BLOCK  SURGEON:  Surgeon(s) and Role:    Lightfoot, Lucile Crater, MD - Primary  PHYSICIAN ASSISTANT: Lars Pinks PA-C  ANESTHESIA:   general  EBL:  Per anesthesia record  BLOOD ADMINISTERED:none  DRAINS: 59 Blake drain placed in the right pleural space   LOCAL MEDICATIONS USED:  OTHER Exparel  SPECIMEN:  Source of Specimen:  Wedge of RUL, RML, RLL  DISPOSITION OF SPECIMEN:  PATHOLOGY  COUNTS CORRECT:  YES  DICTATION: .Dragon Dictation  PLAN OF CARE: Admit to inpatient   PATIENT DISPOSITION:  PACU - hemodynamically stable.   Delay start of Pharmacological VTE agent (>24hrs) due to surgical blood loss or risk of bleeding: yes

## 2020-07-21 NOTE — H&P (Signed)
[address]       [place] [address]             [phone]       Yvonne Jordan [facility] Medical Record #[id number] Date of Birth: Oct 24, 1946  Referring: Marshell Garfinkel, MD Primary Care: Antony Contras, MD Primary Cardiologist: Buford Dresser, MD  Chief Complaint:        Chief Complaint  Patient presents with  . Lung Lesion    Consultation, Chest CT 6/25, PFT 7/13   No events since her clinic appointment OR today for Right RATS, wedge resection  Per my last clinic note  History of Present Illness:    Yvonne Jordan 74 y.o. female presents for surgical evaluation of progressive shortness of breath.  She has been evaluated by Dr. Vaughan Browner, and there is concern that she may have an interstitial lung disease.  In regards to her symptoms, her dyspnea has progressed over the last 2-1/2 years.  She now is short of breath with minimal activity, and on a daily basis she has severe bouts.  These are often associated with increasing her activity.  She denies any chest pain or pleuritic chest pain.  Her weight has been stable.  She does admit to a history of reflux but states that this is well controlled for the past 3 years on her PPI.  She denies any chronic cough.    Smoking Hx: Lifelong non-smoker but most of her friends do smoke.  She has worked in [employer] of her career, and has not had any environmental exposures.   Zubrod Score: At the time of surgery this patient's most appropriate activity status/level should be described as: [] ?    0    Normal activity, no symptoms [x] ?    1    Restricted in physical strenuous activity but ambulatory, able to do out light work [] ?    2    Ambulatory and capable of self care, unable to do work activities, up and about               >50 % of waking hours                              [] ?    3    Only limited self care, in bed greater than 50% of waking hours [] ?    4    Completely disabled, no  self care, confined to bed or chair [] ?    5    Moribund       Past Medical History:  Diagnosis Date  . Allergic rhinitis   . Cancer (HCC)    Breast  . CKD (chronic kidney disease)   . Colon polyp   . Hyperlipidemia   . Personal history of radiation therapy   . TIA (transient ischemic attack)   . Vitamin D deficiency          Past Surgical History:  Procedure Laterality Date  . BREAST LUMPECTOMY Left 2006  . BREAST SURGERY     2006...treated with Tamoxifen for 5 years  . CHOLECYSTECTOMY           Family History  Problem Relation Age of Onset  . Breast cancer Sister   . Heart attack Sister   . Heart disease Father   . Stroke Father   . Heart disease Maternal Uncle   . Heart disease Maternal Uncle   .  Heart disease Maternal Uncle   . Stroke Mother   . Deep vein thrombosis Mother      Social History      Tobacco Use  Smoking Status Never Smoker  Smokeless Tobacco Never Used    Social History       Substance and Sexual Activity  Alcohol Use Yes  . Alcohol/week: 0.0 standard drinks   Comment: 1 drink per wk         Allergies  Allergen Reactions  . Other           Current Outpatient Medications  Medication Sig Dispense Refill  . acetaminophen (TYLENOL) 325 MG tablet Take 650 mg by mouth every 6 (six) hours as needed. At night if needed    . atorvastatin (LIPITOR) 20 MG tablet     . clopidogrel (PLAVIX) 75 MG tablet Take 75 mg by mouth daily with breakfast.    . Multiple Vitamin (MULTIVITAMIN) capsule Take 1 capsule by mouth daily.    . pantoprazole (PROTONIX) 40 MG tablet Take 1 tablet (40 mg total) by mouth 2 (two) times daily before a meal. For 30 days 180 tablet 3  . Cholecalciferol (VITAMIN D3) 2000 UNITS TABS Take by mouth. (Patient not taking: Reported on 07/13/2020)    . diclofenac Sodium (VOLTAREN) 1 % GEL Voltaren 1 % topical gel  APPLY 2 GRAMS TO THE AFFECTED AREA(S) BY TOPICAL ROUTE 4  TIMES PER DAY (Patient not taking: Reported on 07/05/2020)     No current facility-administered medications for this visit.    Review of Systems  Constitutional: Positive for malaise/fatigue. Negative for fever and weight loss.  HENT: Negative for sinus pain and sore throat.   Respiratory: Positive for shortness of breath. Negative for cough and stridor.   Cardiovascular: Negative for chest pain.  Gastrointestinal: Positive for heartburn. Negative for abdominal pain.  Musculoskeletal: Positive for joint pain.  Neurological: Negative.      PHYSICAL EXAMINATION: BP (!) 152/84 (BP Location: Right Arm, Patient Position: Sitting, Cuff Size: Normal)   Pulse 97   Temp (!) 97 F (36.1 C) (Temporal)   Resp 20   Ht 5\' 5"  (1.651 m)   Wt 185 lb (83.9 kg)   SpO2 96% Comment: RA  BMI 30.79 kg/m  Physical Exam Constitutional:      Appearance: Normal appearance.  HENT:     Head: Normocephalic and atraumatic.  Eyes:     Extraocular Movements: Extraocular movements intact.     Conjunctiva/sclera: Conjunctivae normal.  Cardiovascular:     Rate and Rhythm: Normal rate and regular rhythm.     Heart sounds: No murmur heard.   Pulmonary:     Effort: Pulmonary effort is normal. No respiratory distress.     Breath sounds: Normal breath sounds.  Abdominal:     General: Abdomen is flat. There is no distension.  Musculoskeletal:        General: Normal range of motion.  Skin:    General: Skin is warm and dry.  Neurological:     General: No focal deficit present.     Mental Status: She is alert and oriented to person, place, and time.     Diagnostic Studies & Laboratory data:     Recent Radiology Findings:    Imaging Results  CT Chest High Resolution  Result Date: 06/17/2020 CLINICAL DATA:  Chronic dyspnea for over 2 years. History of left breast lumpectomy. EXAM: CT CHEST WITHOUT CONTRAST TECHNIQUE: Multidetector CT imaging of the chest was performed following  the standard  protocol without intravenous contrast. High resolution imaging of the lungs, as well as inspiratory and expiratory imaging, was performed. COMPARISON:  05/07/2019 coronary CT. FINDINGS: Cardiovascular: Normal heart size. No significant pericardial effusion/thickening. Atherosclerotic nonaneurysmal thoracic aorta. Normal caliber pulmonary arteries. Mediastinum/Nodes: Multiple bilateral hypodense thyroid nodules, largest 2.5 cm on the right. Unremarkable esophagus. No pathologically enlarged axillary, mediastinal or hilar lymph nodes, noting limited sensitivity for the detection of hilar adenopathy on this noncontrast study. Surgical clips in the left axilla. Lungs/Pleura: No pneumothorax. No pleural effusion. Solid 3 mm right middle lobe pulmonary nodule (series 5/image 81), stable, considered benign. No acute consolidative airspace disease, lung masses or new significant pulmonary nodules. Moderate patchy air trapping in both lungs on the expiration sequence, without evidence of tracheobronchomalacia. Mild patchy ground-glass opacity with a dependent lower lobe predominance. Minimal traction bronchiolectasis in basilar lower lobes. No significant regions of architectural distortion or frank honeycombing. Upper abdomen: Small hiatal hernia. Scattered simple small liver cysts, largest 1.9 cm in the left liver lobe. Cholecystectomy. Musculoskeletal: No aggressive appearing focal osseous lesions. Mild thoracic spondylosis. IMPRESSION: 1. Moderate patchy air trapping in both lungs, indicative of small airways disease. Mild patchy ground-glass opacity and minimal traction bronchiolectasis with a dependent lower lobe predominance. No honeycombing. Findings are suggestive of a mild interstitial lung disease such as nonspecific interstitial pneumonia (NSIP) or chronic hypersensitivity pneumonitis. Findings are suggestive of an alternative diagnosis (not UIP) per consensus guidelines: Diagnosis of Idiopathic Pulmonary  Fibrosis: An Official Clinical Practice Guideline. [place], 5, -, 19 2018. 2. Small hiatal hernia. 3. Multiple bilateral hypodense thyroid nodules, largest 2.5 cm on the right. Recommend thyroid US (ref: J Am Coll Radiol. 2015 Feb;12(2): 143-50). 4. Aortic Atherosclerosis (ICD10-I70.0). Electronically Signed   By: Ilona Sorrel M.D.   On: 06/17/2020 15:03   DG BONE DENSITY (DXA)  Result Date: 06/30/2020 EXAM: DUAL X-RAY ABSORPTIOMETRY (DXA) FOR BONE MINERAL DENSITY IMPRESSION: Referring Physician:  Antony Contras Your patient completed a BMD test using Lunar IDXA DXA system ( analysis version: 16 ) manufactured by [employer]. Technologist: AW PATIENT: Name: Caretha, Rumbaugh Patient ID: [id number] Birth Date: 1946-01-24 Height: 64.0 in. Sex: Female Measured: 06/30/2020 Weight: 188.6 lbs. Indications: Advanced Age, Caucasian, Estrogen Deficient, Postmenopausal Fractures: None Treatments: Calcium (E943.0), Vitamin D (E933.5) ASSESSMENT: The BMD measured at Femur Neck Right is 0.881 g/cm2 with a T-score of -1.1. This patient is considered osteopenic according to [facility]) criteria. The scan quality is good. L-3 and L-4 were excluded due to degenerative changes. Site Region Measured Date Measured Age YA BMD Significant CHANGE T-score DualFemur Neck Right 06/30/2020    74.6         -1.1    0.881 g/cm2 AP Spine  L1-L2      5/30/2020    74.6         -0.6    1.092 g/cm2 DualFemur Total Mean 06/30/2020    74.6         -0.1    0.992 g/cm2 World Health Organization [facility]) criteria for post-menopausal, Caucasian Women: Normal       T-score at or above -1 SD Osteopenia   T-score between -1 and -2.5 SD Osteoporosis T-score at or below -2.5 SD RECOMMENDATION: 1. All patients should optimize calcium and vitamin D intake. 2. Consider FDA approved medical therapies in postmenopausal women and men aged 17 years and older, based on the following: a. A hip or vertebral (clinical or morphometric)  fracture b. T- score < or = -2.5 at the femoral neck or spine after appropriate evaluation to exclude secondary causes c. Low bone mass (T-score between -1.0 and -2.5 at the femoral neck or spine) and a 10 year probability of a hip fracture > or = 3% or a 10 year probability of a major osteoporosis-related fracture > or = 20% based on the [place]-adapted WHO algorithm d. Clinician judgment and/or patient preferences may indicate treatment for people with 10-year fracture probabilities above or below these levels FOLLOW-UP: Patients with diagnosis of osteoporosis or at high risk for fracture should have regular bone mineral density tests. For patients eligible for Medicare, routine testing is allowed once every 2 years. The testing frequency can be increased to one year for patients who have rapidly progressing disease, those who are receiving or discontinuing medical therapy to restore bone mass, or have additional risk factors. I have reviewed this report and agree with the above findings. [facility] FRAX* 10-year Probability of Fracture Based on femoral neck BMD: DualFemur (Right) Major Osteoporotic Fracture: 9.5% Hip Fracture:                1.5% Population:                  [place] (Caucasian) Risk Factors:                None *FRAX is a Materials engineer of the [employer] for Metabolic Bone Disease, a World Pharmacologist (WHO) [facility]. ASSESSMENT: The probability of a major osteoporotic fracture is 9.5 % within the next ten years. The probability of a hip fracture is 1.5 % within the next ten years. Electronically Signed   By: Rolm Baptise M.D.   On: 06/30/2020 15:29   DG Knee Complete 4 Views Right  Result Date: 06/30/2020 CLINICAL DATA:  Anterior right knee pain after fall 3 days ago EXAM: RIGHT KNEE - COMPLETE 4+ VIEW COMPARISON:  None. FINDINGS: No evidence of acute fracture, dislocation, or joint effusion. Joint spaces appear maintained. Subtle  chondrocalcinosis within the lateral compartment. Well corticated ossification along the superior margin of the fibular head, which may reflect sequela of remote trauma. Soft tissues are unremarkable. IMPRESSION: Negative. Electronically Signed   By: Davina Poke D.O.   On: 06/30/2020 12:03   DG Foot Complete Left  Result Date: 06/30/2020 Please see detailed radiograph report in office note.       I have independently reviewed the above radiology studies  and reviewed the findings with the patient.   Recent Lab Findings: Recent Labs       Lab Results  Component Value Date   CREATININE 1.10 (H) 05/07/2019       PFTs: - FVC: 83% - FEV1: 95% -DLCO: 84%  Problem list: Progressive dyspnea, concern for interstitial lung disease Multiple bilateral thyroid nodules. Small hiatal hernia History of TIA  Assessment / Plan:   74 year old female with mild airway disease concerning for interstitial lung disease.  I personally reviewed her CT scan and there is no significant radiographic disease outside of some groundglass opacities and bronchiectasis and the lower lobe mostly.  I do think that she is an adequate candidate for surgical biopsy, and the risks and benefits were discussed in great detail with her and her grandson.  She has agreed to proceed and is tentatively scheduled for July 29.  She has a history of a TIA and has been placed on Plavix, and this will  be discontinued prior to surgery.  She also has a small hiatal hernia and does not have much reflux symptoms now that she is on a PPI.  There is a small concern that she may also have some silent aspiration if in the future this presents further complications or pulmonary process then we could consider performing an antireflux procedure.    Additionally she has bilateral thyroid nodules which will be evaluated following her lung biopsy.

## 2020-07-21 NOTE — Discharge Summary (Signed)
Physician Discharge Summary       [address]       [place] [address]             [phone]    Patient ID: Yvonne Jordan MRN: [id number] DOB/AGE: [birth year] 75 y.o.  Admit date: 07/21/2020 Discharge date: 07/22/2020  Admission Diagnoses: Interstitial lung disease Discharge Diagnoses:  1. S/p roboitc assisted right video thoracoscopy, wedge resections of the RUL, RML, and RLL, and intercostal nerve block 2. History of CKD (chronic kidney disease) 3. History of breast cancer ([place]) 4. History of hyperlipidemia 5. History of TIA (transient ischemic attack) 6. History of allergic rhinitis 7. History of Vitamin D deficiency  Consults: None  Procedure (s):  Robotic assisted right video thoracoscopy - Wedge resection of the upper, middle, and lower lobes - Intercostal nerve block by Dr. Kipp Brood on 07/21/2020.  Pathology: Final results are pending  History of Presenting Illness: Yvonne Jordan 74 y.o. female presents for surgical evaluation of progressive shortness of breath.  She has been evaluated by Dr. Vaughan Browner, and there is concern that she may have an interstitial lung disease.  In regards to her symptoms, her dyspnea has progressed over the last 2-1/2 years.  She now is short of breath with minimal activity, and on a daily basis she has severe bouts.  These are often associated with increasing her activity.  She denies any chest pain or pleuritic chest pain.  Her weight has been stable.  She does admit to a history of reflux but states that this is well controlled for the past 3 years on her PPI.  She denies any chronic cough.    Smoking Hx: Lifelong non-smoker but most of her friends do smoke.   She has worked in [employer] of her career, and has not had any environmental exposures.  Dr. Tyson Alias personally reviewed her CT scan and there is no significant radiographic disease outside of some groundglass opacities and bronchiectasis and the  lower lobe mostly.  Dr.Lightfoot thinsk that she is an adequate candidate for surgical biopsy, and the risks and benefits were discussed in great detail with her and her grandson. Patient was admitted on 07/21/2020 in order to undergo a robotic assisted wedge resections of RUL, RML, and RLL and intercostal nerve block.  Brief Hospital Course:  The patient remained afebrile and hemodynamically stable. A line and foley were removed early in the post operative course. Chest tube output gradually decreased and there was no air leak. Daily chest x rays were obtained and remained stable. Chest tube was Removed on 07/30.  Patient is ambulating on room air. Patient is tolerating a diet and has had a bowel movement. Wounds are clean and dry. Nurse reports patient with some ambulation difficulties prior to surgery. PT evaluation suggested home PT and rolling walker (5 inch wheels). Final chest X ray showed a small right apical pneumothorax status post right-sided chest tube removal.. As discussed with Dr. Kipp Brood, the patient is felt surgically stable for discharge today.   Latest Vital Signs: Blood pressure (!) 117/57, pulse 75, temperature 97.9 F (36.6 C), temperature source Oral, resp. rate 19, height 5\' 5"  (1.651 m), weight 83.9 kg, SpO2 95 %.  Physical Exam: Cardiovascular: RRR Pulmonary: Clear to auscultation bilaterally Abdomen: Soft, non tender, bowel sounds present. Extremities: SCDs in place Wounds: Clean and dry.  No erythema or signs of infection. Chest Tube: to water seal, tidling with cough but no air leak  Discharge Condition: Stable  and discharged to home.  Recent laboratory studies:  Lab Results  Component Value Date   WBC 15.2 (H) 07/22/2020   HGB 10.6 (L) 07/22/2020   HCT 33.7 (L) 07/22/2020   MCV 80.2 07/22/2020   PLT 248 07/22/2020   Lab Results  Component Value Date   NA 140 07/22/2020   K 4.4 07/22/2020   CL 112 (H) 07/22/2020   CO2 19 (L) 07/22/2020   CREATININE  1.17 (H) 07/22/2020   GLUCOSE 112 (H) 07/22/2020      Diagnostic Studies: DG Chest 2 View  Result Date: 07/20/2020 CLINICAL DATA:  Interstitial lung disease.  Dyspnea EXAM: CHEST - 2 VIEW COMPARISON:  06/17/2020 FINDINGS: The heart size and mediastinal contours are within normal limits. Mild atherosclerotic calcification of the aortic knob. No focal airspace consolidation, pleural effusion, or pneumothorax. The visualized skeletal structures are unremarkable. IMPRESSION: No active cardiopulmonary disease. Electronically Signed   By: Davina Poke D.O.   On: 07/20/2020 09:42   DG BONE DENSITY (DXA)  Result Date: 06/30/2020 EXAM: DUAL X-RAY ABSORPTIOMETRY (DXA) FOR BONE MINERAL DENSITY IMPRESSION: Referring Physician:  Antony Contras Your patient completed a BMD test using Lunar IDXA DXA system ( analysis version: 16 ) manufactured by [employer]. Technologist: AW PATIENT: Name: Yvonne, Jordan Patient ID: [id number] Birth Date: 11-Nov-1946 Height: 64.0 in. Sex: Female Measured: 06/30/2020 Weight: 188.6 lbs. Indications: Advanced Age, Caucasian, Estrogen Deficient, Postmenopausal Fractures: None Treatments: Calcium (E943.0), Vitamin D (E933.5) ASSESSMENT: The BMD measured at Femur Neck Right is 0.881 g/cm2 with a T-score of -1.1. This patient is considered osteopenic according to [facility]) criteria. The scan quality is good. L-3 and L-4 were excluded due to degenerative changes. Site Region Measured Date Measured Age YA BMD Significant CHANGE T-score DualFemur Neck Right 5/30/2020    [age]         -1.1    0.881 g/cm2 AP Spine  L1-L2      5/30/2020    [age]         -0.6    1.092 g/cm2 DualFemur Total Mean 5/30/2020    [age]         -0.1    0.992 g/cm2 World Health Organization [facility]) criteria for post-menopausal, Caucasian Women: Normal       T-score at or above -1 SD Osteopenia   T-score between -1 and -2.5 SD Osteoporosis T-score at or below -2.5 SD RECOMMENDATION: 1. All patients  should optimize calcium and vitamin D intake. 2. Consider FDA approved medical therapies in postmenopausal women and men aged 33 years and older, based on the following: a. A hip or vertebral (clinical or morphometric) fracture b. T- score < or = -2.5 at the femoral neck or spine after appropriate evaluation to exclude secondary causes c. Low bone mass (T-score between -1.0 and -2.5 at the femoral neck or spine) and a 10 year probability of a hip fracture > or = 3% or a 10 year probability of a major osteoporosis-related fracture > or = 20% based on the [place]-adapted WHO algorithm d. Clinician judgment and/or patient preferences may indicate treatment for people with 10-year fracture probabilities above or below these levels FOLLOW-UP: Patients with diagnosis of osteoporosis or at high risk for fracture should have regular bone mineral density tests. For patients eligible for Medicare, routine testing is allowed once every 2 years. The testing frequency can be increased to one year for patients who have rapidly progressing disease, those who are receiving or discontinuing medical therapy to  restore bone mass, or have additional risk factors. I have reviewed this report and agree with the above findings. [facility] FRAX* 10-year Probability of Fracture Based on femoral neck BMD: DualFemur (Right) Major Osteoporotic Fracture: 9.5% Hip Fracture:                1.5% Population:                  [place] (Caucasian) Risk Factors:                None *FRAX is a Materials engineer of the [employer] of [employer] for Metabolic Bone Disease, a World Pharmacologist (WHO) [facility]. ASSESSMENT: The probability of a major osteoporotic fracture is 9.5 % within the next ten years. The probability of a hip fracture is 1.5 % within the next ten years. Electronically Signed   By: Rolm Baptise M.D.   On: 06/30/2020 15:29   DG CHEST PORT 1 VIEW  Result Date: 07/22/2020 CLINICAL DATA:  Recent  pneumothorax with chest tube in place EXAM: PORTABLE CHEST 1 VIEW COMPARISON:  July 21, 2020 FINDINGS: Chest tube on the right is unchanged in position. Currently no evident pneumothorax. There is postoperative change on the right. There is slight right base atelectasis. Lungs otherwise are clear. Heart size and pulmonary vascularity are normal. No adenopathy. No bone lesions. There are surgical clips in the left breast region. IMPRESSION: Chest tube on the right, unchanged in position. No appreciable pneumothorax. Postoperative change noted on the right with slight right base atelectasis. Lungs otherwise clear. Cardiac silhouette within normal limits. Electronically Signed   By: Lowella Grip III M.D.   On: 07/22/2020 07:44   DG Chest Port 1 View  Result Date: 07/21/2020 CLINICAL DATA:  Pneumothorax EXAM: PORTABLE CHEST 1 VIEW COMPARISON:  07/19/2020 FINDINGS: Right apical chest tube. Trace right apical pneumothorax. Low lung volumes. New right lung sutures are noted. Mild bibasilar atelectasis. There is elevation of the right hemidiaphragm. No significant pleural effusion. Similar cardiomediastinal contours. IMPRESSION: Right apical chest tube with trace pneumothorax. New right lung operative changes. Electronically Signed   By: Macy Mis M.D.   On: 07/21/2020 10:32   DG Knee Complete 4 Views Right  Result Date: 06/30/2020 CLINICAL DATA:  Anterior right knee pain after fall 3 days ago EXAM: RIGHT KNEE - COMPLETE 4+ VIEW COMPARISON:  None. FINDINGS: No evidence of acute fracture, dislocation, or joint effusion. Joint spaces appear maintained. Subtle chondrocalcinosis within the lateral compartment. Well corticated ossification along the superior margin of the fibular head, which may reflect sequela of remote trauma. Soft tissues are unremarkable. IMPRESSION: Negative. Electronically Signed   By: Davina Poke D.O.   On: 06/30/2020 12:03   DG Foot Complete Left  Result Date: 06/30/2020 Please  see detailed radiograph report in office note.     Discharge Medications: Allergies as of 07/22/2020   No Known Allergies      Medication List     STOP taking these medications    APPLE CIDER VINEGAR PO       TAKE these medications    acetaminophen 325 MG tablet Commonly known as: TYLENOL Take 650 mg by mouth every 6 (six) hours as needed. At night if needed   atorvastatin 20 MG tablet Commonly known as: LIPITOR   CALCIUM/D3 ADULT GUMMIES PO Take 2 tablets by mouth daily.   clopidogrel 75 MG tablet Commonly known as: PLAVIX Take 75 mg by mouth daily with breakfast.  multivitamin capsule Take 1 capsule by mouth daily.   oxyCODONE 5 MG immediate release tablet Commonly known as: Oxy IR/ROXICODONE Take 1 tablet (5 mg total) by mouth every 4 (four) hours as needed for severe pain.   pantoprazole 40 MG tablet Commonly known as: PROTONIX Take 1 tablet (40 mg total) by mouth 2 (two) times daily before a meal. For 30 days What changed: when to take this   SYSTANE OP Place 1 drop into both eyes 2 (two) times daily as needed (dry eyes).   tretinoin 0.025 % cream Commonly known as: RETIN-A Apply 1 application topically at bedtime.   Vitamin D3 50 MCG (2000 UT) Tabs Take by mouth.   Voltaren 1 % Gel Generic drug: diclofenac Sodium Voltaren 1 % topical gel  APPLY 2 GRAMS TO THE AFFECTED AREA(S) BY TOPICAL ROUTE 4 TIMES PER DAY        Follow Up Appointments:  Follow-up Information     Lajuana Matte, MD. Go on 07/29/2020.   Specialty: Cardiothoracic Surgery Why: Appointment time is at 11:15 am Contact information: [place] [address] [phone]                 Signed: Sharalyn [facility] 07/22/2020, 8:24 AM

## 2020-07-21 NOTE — Anesthesia Procedure Notes (Signed)
Arterial Line Insertion Start/End7/29/2021 6:55 AM, 07/21/2020 7:10 AM Performed by: Wilburn Cornelia, CRNA, CRNA  Patient location: Pre-op. Preanesthetic checklist: patient identified, IV checked, risks and benefits discussed, surgical consent, monitors and equipment checked, pre-op evaluation and timeout performed Lidocaine 1% used for infiltration and patient sedated Right, radial was placed Catheter size: 20 G Maximum sterile barriers used   Attempts: 2 Procedure performed without using ultrasound guided technique. Following insertion, dressing applied and Biopatch. Post procedure assessment: normal  Patient tolerated the procedure well with no immediate complications. Additional procedure comments: 1st attempt per [facility], CRNA unable to advance arterial catheter then 2nd attempt successful Truman Hayward, CRNA.

## 2020-07-21 NOTE — Plan of Care (Signed)

## 2020-07-21 NOTE — Plan of Care (Signed)
  Problem: Education: Goal: Knowledge of disease or condition will improve Outcome: Progressing   Problem: Education: Goal: Knowledge of the prescribed therapeutic regimen will improve Outcome: Progressing   Problem: Activity: Goal: Risk for activity intolerance will decrease Outcome: Progressing   Problem: Cardiac: Goal: Will achieve and/or maintain hemodynamic stability Outcome: Progressing   Problem: Clinical Measurements: Goal: Postoperative complications will be avoided or minimized Outcome: Progressing   Problem: Pain Management: Goal: Pain level will decrease Outcome: Progressing   Problem: Skin Integrity: Goal: Wound healing without signs and symptoms infection will improve Outcome: Progressing   Problem: Education: Goal: Knowledge of General Education information will improve Description: Including pain rating scale, medication(s)/side effects and non-pharmacologic comfort measures Outcome: Progressing   Problem: Clinical Measurements: Goal: Ability to maintain clinical measurements within normal limits will improve Outcome: Progressing   Problem: Clinical Measurements: Goal: Diagnostic test results will improve Outcome: Progressing   Problem: Activity: Goal: Risk for activity intolerance will decrease Outcome: Progressing   Problem: Nutrition: Goal: Adequate nutrition will be maintained Outcome: Progressing   Problem: Pain Managment: Goal: General experience of comfort will improve Outcome: Progressing   Problem: Safety: Goal: Ability to remain free from injury will improve Outcome: Progressing   Problem: Skin Integrity: Goal: Risk for impaired skin integrity will decrease Outcome: Progressing

## 2020-07-21 NOTE — Anesthesia Procedure Notes (Signed)
Procedure Name: Intubation Date/Time: 07/21/2020 8:22 AM Performed by: Orlie Dakin, CRNA Pre-anesthesia Checklist: Patient identified, Emergency Drugs available, Suction available and Patient being monitored Patient Re-evaluated:Patient Re-evaluated prior to induction Oxygen Delivery Method: Circle system utilized Preoxygenation: Pre-oxygenation with 100% oxygen Induction Type: IV induction Ventilation: Mask ventilation without difficulty Laryngoscope Size: Miller and 3 Grade View: Grade I Tube type: Oral Endobronchial tube: Left, Double lumen EBT, EBT position confirmed by auscultation and EBT position confirmed by fiberoptic bronchoscope and 37 Fr Number of attempts: 1 Airway Equipment and Method: Stylet Placement Confirmation: ETT inserted through vocal cords under direct vision,  positive ETCO2 and breath sounds checked- equal and bilateral Secured at: 28 cm Tube secured with: Tape Dental Injury: Teeth and Oropharynx as per pre-operative assessment  Comments: 4x4s bite block used.

## 2020-07-21 NOTE — Transfer of Care (Signed)
Immediate Anesthesia Transfer of Care Note  Patient: Yvonne Jordan  Procedure(s) Performed: XI ROBOTIC ASSISTED THORASCOPY-WEDGE RESECTION OF RIGHT LOWER, RIGHT UPPER AND RIGHT MIDDLE LOBES. (Right Chest)  Patient Location: PACU  Anesthesia Type:General  Level of Consciousness: drowsy and patient cooperative  Airway & Oxygen Therapy: Patient Spontanous Breathing and Patient connected to face mask oxygen  Post-op Assessment: Report given to RN and Post -op Vital signs reviewed and stable  Post vital signs: Reviewed and stable  Last Vitals:  Vitals Value Taken Time  BP 147/75 07/21/20 1000  Temp    Pulse 74 07/21/20 1003  Resp 16 07/21/20 1003  SpO2 99 % 07/21/20 1003  Vitals shown include unvalidated device data.  Last Pain:  Vitals:   07/21/20 0618  TempSrc:   PainSc: 0-No pain         Complications: No complications documented.

## 2020-07-21 NOTE — Op Note (Signed)
      [address]       [place], [address]             [phone]        07/21/2020  Patient:  Yvonne Jordan Pre-Op Dx: Interstitial lung disease Post-op Dx: Same Procedure: - Robotic assisted right video thoracoscopy - Wedge resection of the upper, middle, and lower lobes - Intercostal nerve block  Surgeon and Role:      * Cooper Stamp, Lucile Crater, MD - Primary    *D. Tacy Dura, PA-C- assisting  Anesthesia  general EBL: Minimal  Blood Administration: None Specimen: Right upper, middle, lower lobe wedges  Drains: 52 F argyle chest tube in right chest Counts: correct   Indications: 74 year old female with mild airway disease concerning for interstitial lung disease. I personally reviewed her CT scan and there is no significant radiographic disease outside of some groundglass opacities and bronchiectasis and the lower lobe mostly. I do think that she is an adequate candidate for surgical biopsy, and the risks and benefits were discussed in great detail with her and her grandson. She has agreed to proceed. She has a history of a TIA and has been placed on Plavix, and this will be discontinued prior to surgery. She also has a small hiatal hernia and does not have much reflux symptoms now that she is on a PPI. There is a small concern that she may also have some silent aspiration if in the future this presents further complications on her pulmonary process then we could consider performing an antireflux procedure.   Findings: Normal-appearing lung.  Operative Technique: After the risks, benefits and alternatives were thoroughly discussed, the patient was brought to the operative theatre.  Anesthesia was induced, and the bronchoscope was passed through the endotracheal tube.  All segmental bronchi were visualized.  The endotracheal tube was then exchanged for a double lumen tube.  The patient was then placed in a left lateral decubitus position and was prepped  and draped in normal sterile fashion.  An appropriate surgical pause was performed, and pre-operative antibiotics were dosed accordingly.  We began by placing our 4 robotic ports in the the 7th intercostal space targeting the hilum of the lung.  A 31mm assistant port was placed in the 9th intercostal space in the anterior axillary line.  The robot was then docked and all instruments were passed under direct visualization.    The lung was then inspected in its entirety.  There were no obvious nodules.  Wedge resections of the upper, middle, and lower lobes were performed with intermittent apnea.  An intercostal nerve block was performed under direct visualization.  A 30F chest with then placed, and we watch the remaining lobes re-expand.  The skin and soft tissue were closed with absorbable suture.    The patient tolerated the procedure without any immediate complications, and was transferred to the PACU in stable condition.  Dawnn Nam Bary Leriche

## 2020-07-22 ENCOUNTER — Inpatient Hospital Stay (HOSPITAL_COMMUNITY): Payer: Medicare Other

## 2020-07-22 LAB — BASIC METABOLIC PANEL
Anion gap: 9 (ref 5–15)
BUN: 20 mg/dL (ref 8–23)
CO2: 19 mmol/L — ABNORMAL LOW (ref 22–32)
Calcium: 8.2 mg/dL — ABNORMAL LOW (ref 8.9–10.3)
Chloride: 112 mmol/L — ABNORMAL HIGH (ref 98–111)
Creatinine, Ser: 1.17 mg/dL — ABNORMAL HIGH (ref 0.44–1.00)
GFR calc Af Amer: 53 mL/min — ABNORMAL LOW (ref >60)
GFR calc non Af Amer: 46 mL/min — ABNORMAL LOW (ref >60)
Glucose, Bld: 112 mg/dL — ABNORMAL HIGH (ref 70–99)
Potassium: 4.4 mmol/L (ref 3.5–5.1)
Sodium: 140 mmol/L (ref 135–145)

## 2020-07-22 LAB — CBC
HCT: 33.7 % — ABNORMAL LOW (ref 36.0–46.0)
Hemoglobin: 10.6 g/dL — ABNORMAL LOW (ref 12.0–15.0)
MCH: 25.2 pg — ABNORMAL LOW (ref 26.0–34.0)
MCHC: 31.5 g/dL (ref 30.0–36.0)
MCV: 80.2 fL (ref 80.0–100.0)
Platelets: 248 10*3/uL (ref 150–400)
RBC: 4.2 MIL/uL (ref 3.87–5.11)
RDW: 15.1 % (ref 11.5–15.5)
WBC: 15.2 10*3/uL — ABNORMAL HIGH (ref 4.0–10.5)
nRBC: 0 % (ref 0.0–0.2)

## 2020-07-22 LAB — SURGICAL PATHOLOGY

## 2020-07-22 IMAGING — DX DG CHEST 2V SAME DAY
2 series · 2 of 2 positions shown · non-contrast
Comparison: [DATE].

CLINICAL DATA: Chest tube removal.

EXAM:
CHEST - 2 VIEW SAME DAY

[chest pa]
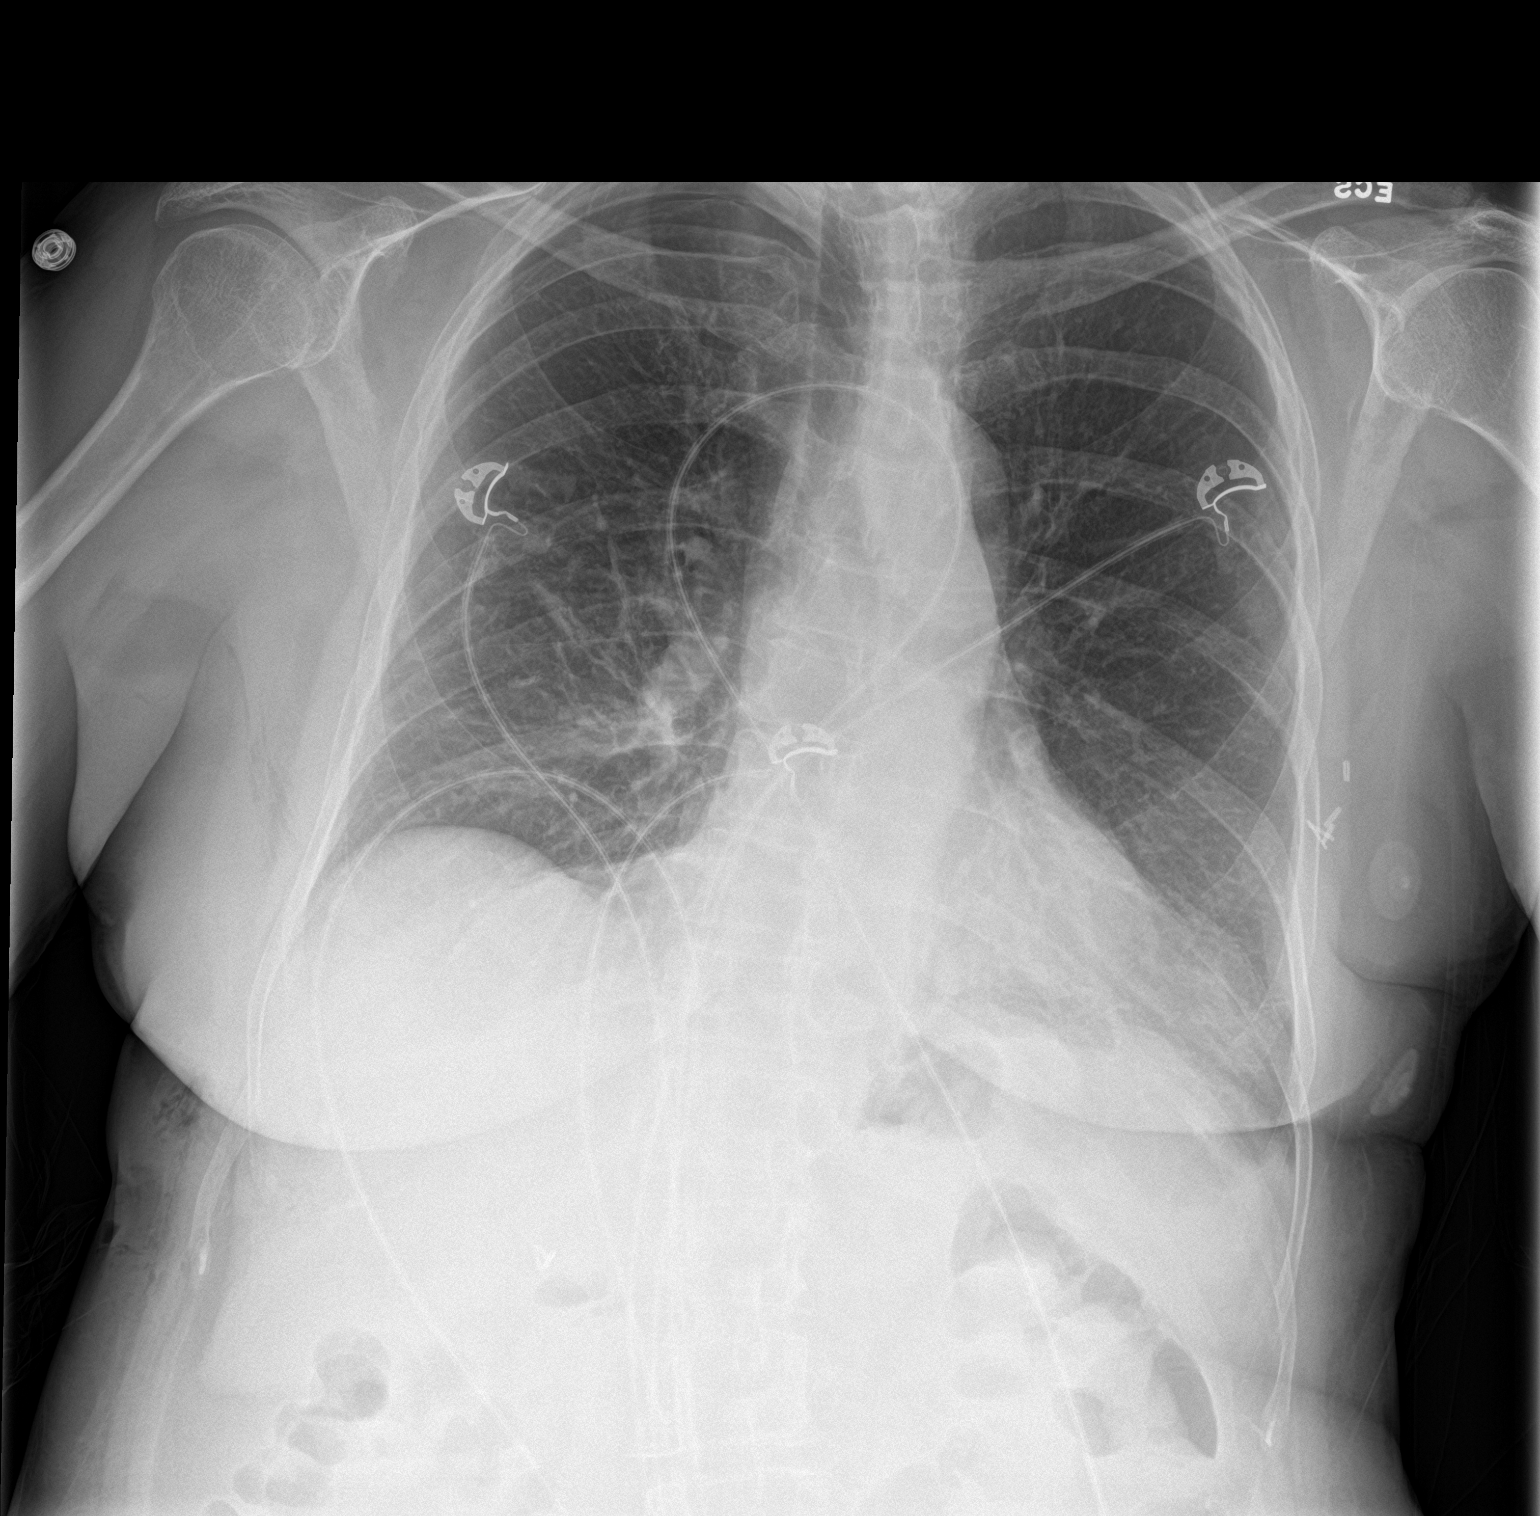

[chest lat]
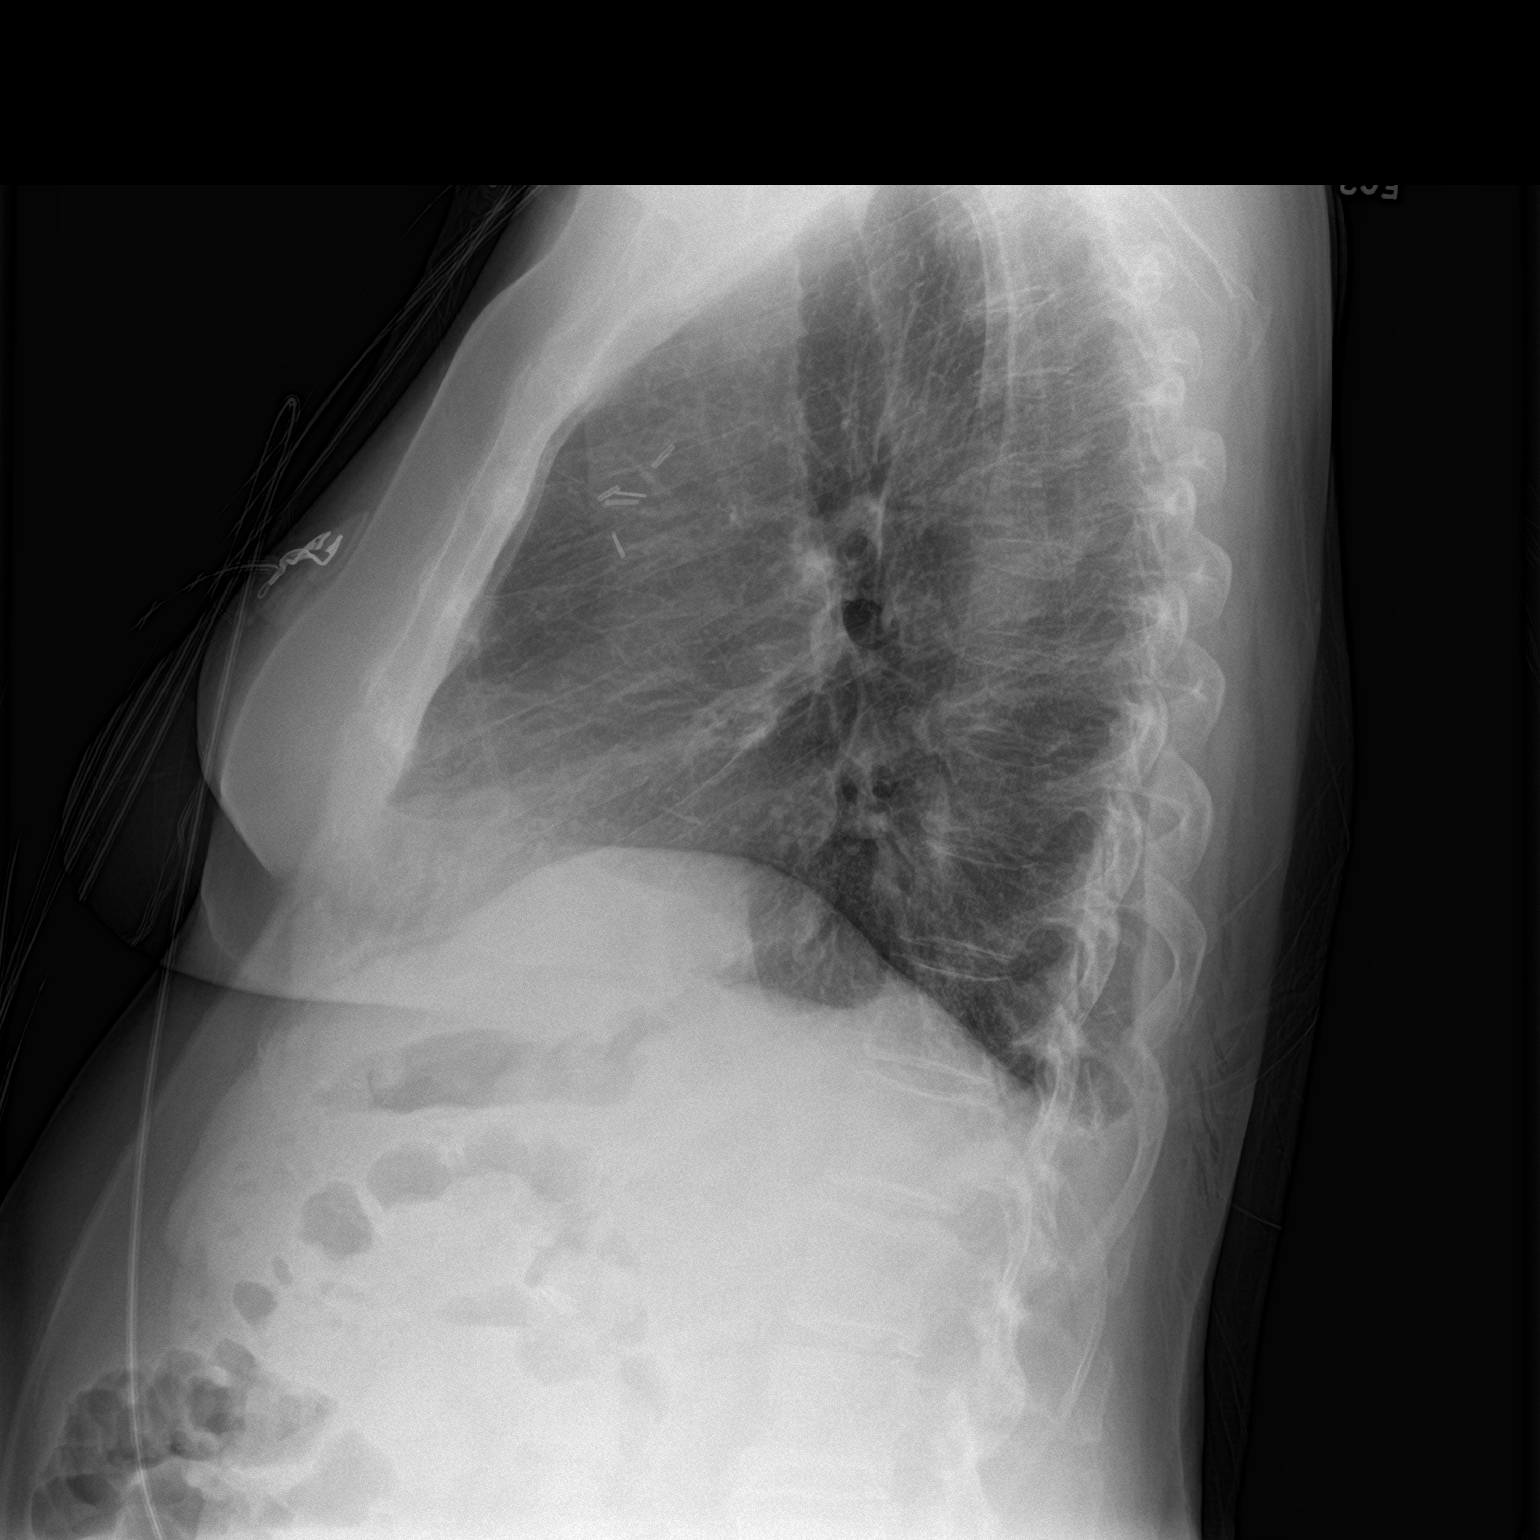

[2 of 2 positions shown; findings below may reference images not displayed]

FINDINGS: The heart size and mediastinal contours are within normal limits.
Small right apical pneumothorax is noted. Right-sided chest tube has
been removed. Minimal bibasilar subsegmental atelectasis is noted.
The visualized skeletal structures are unremarkable.
IMPRESSION: Small right apical pneumothorax status post right-sided chest tube
removal.

## 2020-07-22 IMAGING — DX DG CHEST 1V PORT
1 series · 1 of 1 positions shown · non-contrast
Comparison: [DATE]

CLINICAL DATA: Recent pneumothorax with chest tube in place

EXAM:
PORTABLE CHEST 1 VIEW

[chest ap]
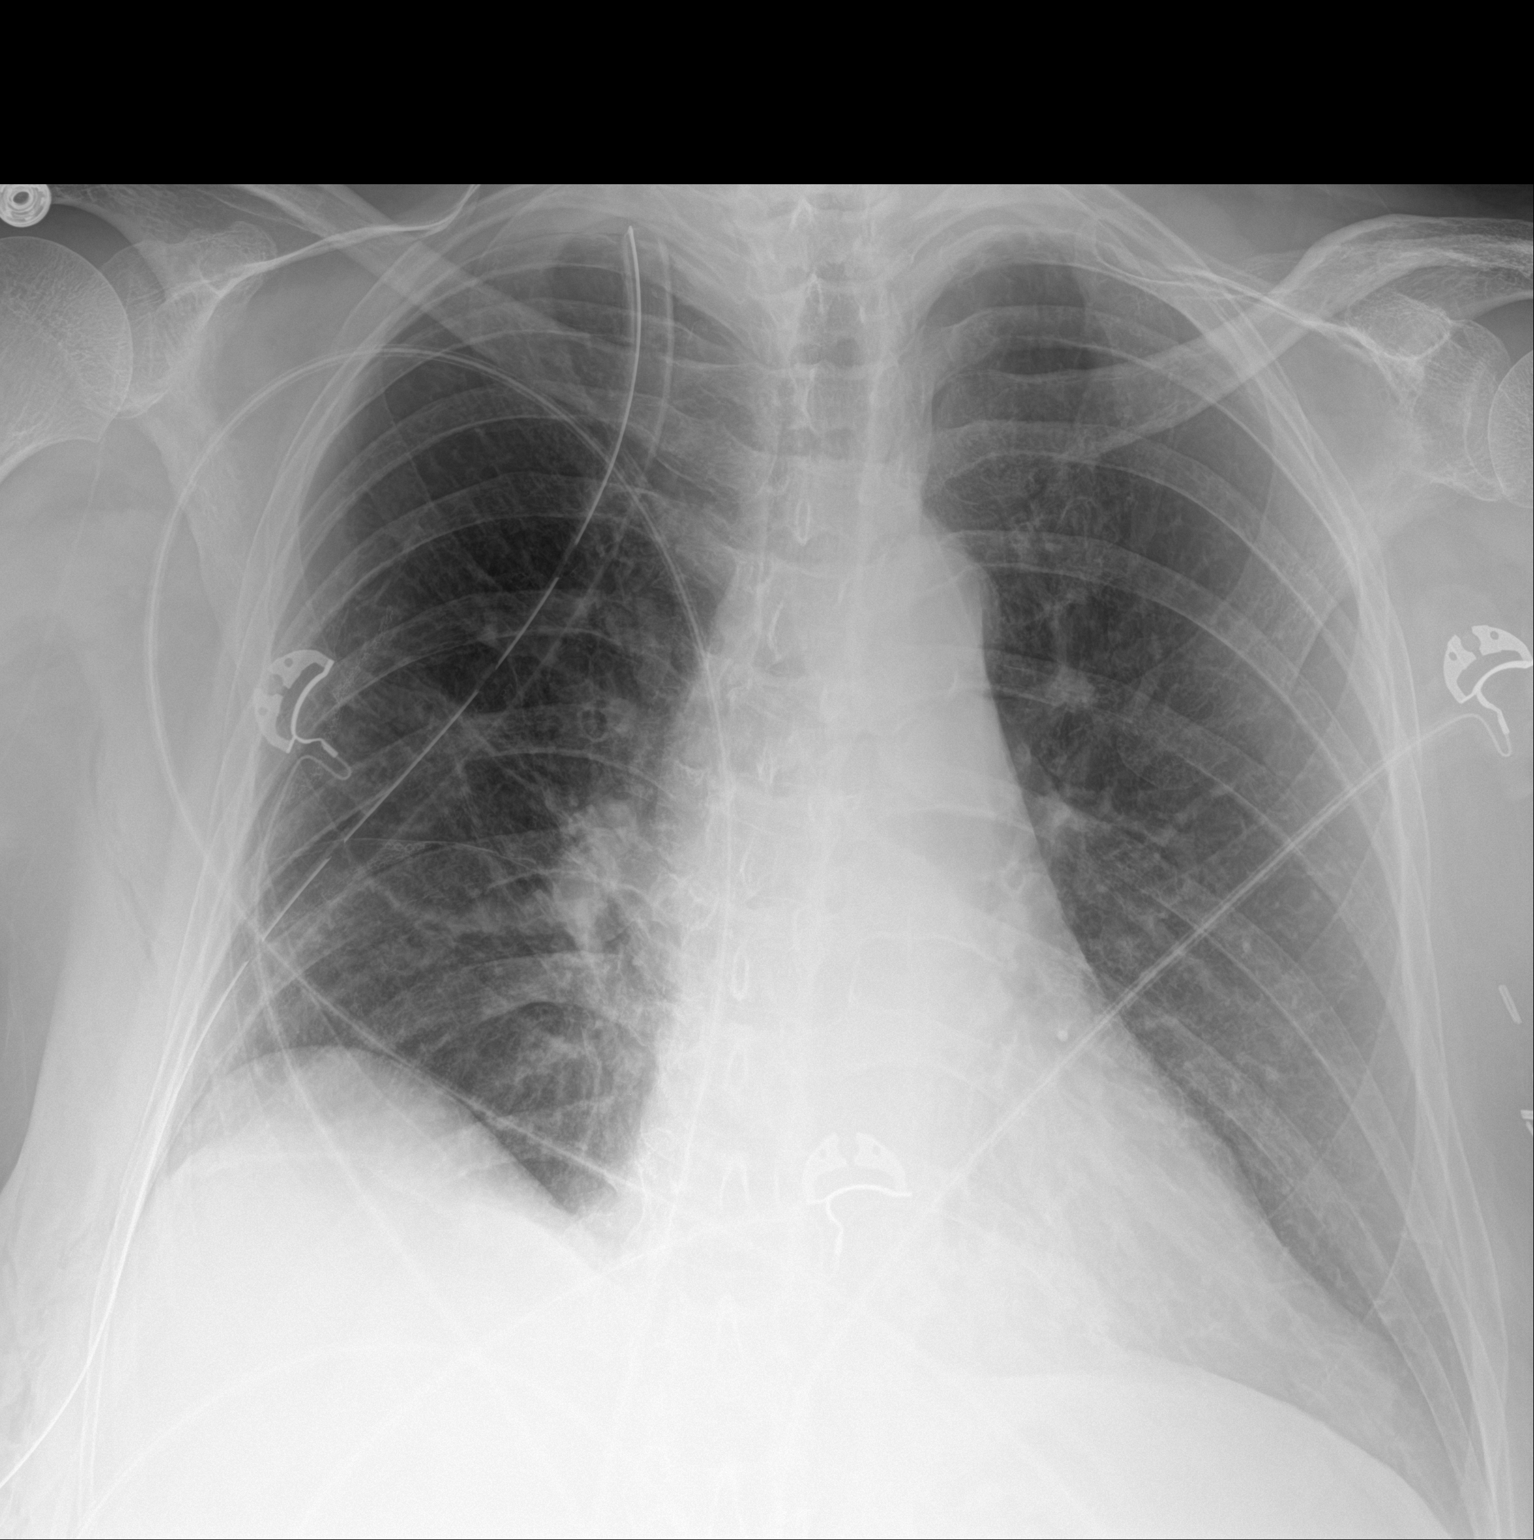

[1 of 1 positions shown; findings below may reference images not displayed]

FINDINGS: Chest tube on the right is unchanged in position. Currently no
evident pneumothorax. There is postoperative change on the right.
There is slight right base atelectasis. Lungs otherwise are clear.
Heart size and pulmonary vascularity are normal. No adenopathy. No
bone lesions. There are surgical clips in the left breast region.
IMPRESSION: Chest tube on the right, unchanged in position. No appreciable
pneumothorax. Postoperative change noted on the right with slight
right base atelectasis. Lungs otherwise clear. Cardiac silhouette
within normal limits.

## 2020-07-22 MED ORDER — OXYCODONE HCL 5 MG PO TABS: 5.0000 mg | ORAL_TABLET | ORAL | 0 refills | Status: DC | PRN

## 2020-07-22 NOTE — Progress Notes (Addendum)
      [address]       [place] [address]             [phone]       1 Day Post-Op Procedure(s) (LRB): XI ROBOTIC ASSISTED THORASCOPY-WEDGE RESECTION OF RIGHT LOWER, RIGHT UPPER AND RIGHT MIDDLE LOBES. (Right)  Subjective: Patient ate breakfast without difficulty. She has some incisional pain this am. Also, she has some swelling/pain right side of neck but had prior to surgery  Objective: Vital signs in last 24 hours: Temp:  [97.5 F (36.4 C)-98.1 F (36.7 C)] 97.9 F (36.6 C) (07/30 0336) Pulse Rate:  [55-78] 75 (07/30 0336) Cardiac Rhythm: Normal sinus rhythm (07/30 0336) Resp:  [11-22] 19 (07/30 0336) BP: (113-158)/(51-84) 117/57 (07/30 0336) SpO2:  [95 %-100 %] 95 % (07/30 0336) Arterial Line BP: (153-183)/(72-102) 183/100 (07/29 1100)      Intake/Output from previous day: 07/29 0701 - 07/30 0700 In: 3008 [P.O.:240; I.V.:2568; IV Piggyback:200] Out: 144 [Urine:100; Chest Tube:44]   Physical Exam:  Cardiovascular: RRR Pulmonary: Clear to auscultation bilaterally Abdomen: Soft, non tender, bowel sounds present. Extremities: SCDs in place Wounds: Clean and dry.  No erythema or signs of infection. Chest Tube: to water seal, tidling with cough but no air leak  Lab Results: CBC: Recent Labs    07/19/20 1434 07/22/20 0353  WBC 10.4 15.2*  HGB 13.3 10.6*  HCT 42.3 33.7*  PLT 327 248   BMET:  Recent Labs    07/19/20 1434 07/22/20 0353  NA 139 140  K 3.8 4.4  CL 110 112*  CO2 16* 19*  GLUCOSE 142* 112*  BUN 16 20  CREATININE 1.15* 1.17*  CALCIUM 8.9 8.2*    PT/INR:  Recent Labs    07/19/20 1434  LABPROT 11.6  INR 0.9   ABG:  INR: Will add last result for INR, ABG once components are confirmed Will add last 4 CBG results once components are confirmed  Assessment/Plan:  1. CV - SR. On Plavix as taken prior to admission 2.  Pulmonary - On room air Chest tube with 44 cc of output since surgery. Chest tube is to water  seal and there is no air leak. CXR this am appears stable. Hope to remove chest tube.  Await final pathology. Encourage incentive spirometer. 3. Anemia-H and H this am decreased to 10.6 and 33.7 4. If CXR stable this am, likely remove chest tube and possibly discharge later today  [facility] ZimmermanPA-C 07/22/2020,7:21 AM [phone]

## 2020-07-22 NOTE — Plan of Care (Signed)
Problem: Education: Goal: Knowledge of disease or condition will improve 07/22/2020 1529 by Domingo Sep, RN Outcome: Adequate for Discharge 07/22/2020 1528 by Domingo Sep, RN Outcome: Adequate for Discharge 07/22/2020 1528 by Domingo Sep, RN Outcome: Adequate for Discharge Goal: Knowledge of the prescribed therapeutic regimen will improve 07/22/2020 1529 by Domingo Sep, RN Outcome: Adequate for Discharge 07/22/2020 1528 by Domingo Sep, RN Outcome: Adequate for Discharge 07/22/2020 1528 by Domingo Sep, RN Outcome: Adequate for Discharge   Problem: Activity: Goal: Risk for activity intolerance will decrease 07/22/2020 1529 by Domingo Sep, RN Outcome: Adequate for Discharge 07/22/2020 1528 by Domingo Sep, RN Outcome: Adequate for Discharge 07/22/2020 1528 by Domingo Sep, RN Outcome: Adequate for Discharge   Problem: Cardiac: Goal: Will achieve and/or maintain hemodynamic stability 07/22/2020 1529 by Domingo Sep, RN Outcome: Adequate for Discharge 07/22/2020 1528 by Domingo Sep, RN Outcome: Adequate for Discharge 07/22/2020 1528 by Domingo Sep, RN Outcome: Adequate for Discharge   Problem: Clinical Measurements: Goal: Postoperative complications will be avoided or minimized 07/22/2020 1529 by Domingo Sep, RN Outcome: Adequate for Discharge 07/22/2020 1528 by Domingo Sep, RN Outcome: Adequate for Discharge 07/22/2020 1528 by Domingo Sep, RN Outcome: Adequate for Discharge   Problem: Respiratory: Goal: Respiratory status will improve 07/22/2020 1529 by Domingo Sep, RN Outcome: Adequate for Discharge 07/22/2020 1528 by Domingo Sep, RN Outcome: Adequate for Discharge 07/22/2020 1528 by Domingo Sep, RN Outcome: Adequate for Discharge   Problem: Pain Management: Goal: Pain level will decrease 07/22/2020 1529 by Domingo Sep, RN Outcome: Adequate for  Discharge 07/22/2020 1528 by Domingo Sep, RN Outcome: Adequate for Discharge 07/22/2020 1528 by Domingo Sep, RN Outcome: Adequate for Discharge   Problem: Skin Integrity: Goal: Wound healing without signs and symptoms infection will improve 07/22/2020 1529 by Domingo Sep, RN Outcome: Adequate for Discharge 07/22/2020 1528 by Domingo Sep, RN Outcome: Adequate for Discharge 07/22/2020 1528 by Domingo Sep, RN Outcome: Adequate for Discharge   Problem: Education: Goal: Knowledge of General Education information will improve Description: Including pain rating scale, medication(s)/side effects and non-pharmacologic comfort measures 07/22/2020 1529 by Domingo Sep, RN Outcome: Adequate for Discharge 07/22/2020 1528 by Domingo Sep, RN Outcome: Adequate for Discharge 07/22/2020 1528 by Domingo Sep, RN Outcome: Adequate for Discharge   Problem: Health Behavior/Discharge Planning: Goal: Ability to manage health-related needs will improve 07/22/2020 1529 by Domingo Sep, RN Outcome: Adequate for Discharge 07/22/2020 1528 by Domingo Sep, RN Outcome: Adequate for Discharge 07/22/2020 1528 by Domingo Sep, RN Outcome: Adequate for Discharge   Problem: Clinical Measurements: Goal: Ability to maintain clinical measurements within normal limits will improve 07/22/2020 1529 by Domingo Sep, RN Outcome: Adequate for Discharge 07/22/2020 1528 by Domingo Sep, RN Outcome: Adequate for Discharge 07/22/2020 1528 by Domingo Sep, RN Outcome: Adequate for Discharge Goal: Will remain free from infection 07/22/2020 1529 by Domingo Sep, RN Outcome: Adequate for Discharge 07/22/2020 1528 by Domingo Sep, RN Outcome: Adequate for Discharge 07/22/2020 1528 by Domingo Sep, RN Outcome: Adequate for Discharge Goal: Diagnostic test results will improve 07/22/2020 1529 by Domingo Sep, RN Outcome:  Adequate for Discharge 07/22/2020 1528 by Domingo Sep, RN Outcome: Adequate for Discharge 07/22/2020 1528 by Domingo Sep, RN Outcome: Adequate for Discharge Goal: Respiratory complications will improve 07/22/2020 1529 by Domingo Sep, RN Outcome: Adequate for Discharge  07/22/2020 1528 by Domingo Sep, RN Outcome: Adequate for Discharge 07/22/2020 1528 by Domingo Sep, RN Outcome: Adequate for Discharge Goal: Cardiovascular complication will be avoided 07/22/2020 1529 by Domingo Sep, RN Outcome: Adequate for Discharge 07/22/2020 1528 by Domingo Sep, RN Outcome: Adequate for Discharge 07/22/2020 1528 by Domingo Sep, RN Outcome: Adequate for Discharge   Problem: Activity: Goal: Risk for activity intolerance will decrease 07/22/2020 1529 by Domingo Sep, RN Outcome: Adequate for Discharge 07/22/2020 1528 by Domingo Sep, RN Outcome: Adequate for Discharge 07/22/2020 1528 by Domingo Sep, RN Outcome: Adequate for Discharge   Problem: Nutrition: Goal: Adequate nutrition will be maintained 07/22/2020 1529 by Domingo Sep, RN Outcome: Adequate for Discharge 07/22/2020 1528 by Domingo Sep, RN Outcome: Adequate for Discharge 07/22/2020 1528 by Domingo Sep, RN Outcome: Adequate for Discharge   Problem: Coping: Goal: Level of anxiety will decrease 07/22/2020 1529 by Domingo Sep, RN Outcome: Adequate for Discharge 07/22/2020 1528 by Domingo Sep, RN Outcome: Adequate for Discharge 07/22/2020 1528 by Domingo Sep, RN Outcome: Adequate for Discharge   Problem: Elimination: Goal: Will not experience complications related to bowel motility 07/22/2020 1529 by Domingo Sep, RN Outcome: Adequate for Discharge 07/22/2020 1528 by Domingo Sep, RN Outcome: Adequate for Discharge 07/22/2020 1528 by Domingo Sep, RN Outcome: Adequate for Discharge Goal: Will not experience  complications related to urinary retention 07/22/2020 1529 by Domingo Sep, RN Outcome: Adequate for Discharge 07/22/2020 1528 by Domingo Sep, RN Outcome: Adequate for Discharge 07/22/2020 1528 by Domingo Sep, RN Outcome: Adequate for Discharge   Problem: Pain Managment: Goal: General experience of comfort will improve 07/22/2020 1529 by Domingo Sep, RN Outcome: Adequate for Discharge 07/22/2020 1528 by Domingo Sep, RN Outcome: Adequate for Discharge 07/22/2020 1528 by Domingo Sep, RN Outcome: Adequate for Discharge   Problem: Safety: Goal: Ability to remain free from injury will improve 07/22/2020 1529 by Domingo Sep, RN Outcome: Adequate for Discharge 07/22/2020 1528 by Domingo Sep, RN Outcome: Adequate for Discharge 07/22/2020 1528 by Domingo Sep, RN Outcome: Adequate for Discharge   Problem: Skin Integrity: Goal: Risk for impaired skin integrity will decrease 07/22/2020 1529 by Domingo Sep, RN Outcome: Adequate for Discharge 07/22/2020 1528 by Domingo Sep, RN Outcome: Adequate for Discharge 07/22/2020 1528 by Domingo Sep, RN Outcome: Adequate for Discharge   Discharge instructions given to patient and spouse at bedside. Questions answered. Educated on new medication regimen, signs/symptoms, restrictions, and follow up appointments. Prescriptions [facility] [place]. HH PT set up by CM.  PIV DC, hemostasis achieved. Vital signs stable. All belongings sent home with patient.   Pt escorted by NT via wheelchair to private vehicle driven by family.

## 2020-07-22 NOTE — Progress Notes (Signed)
Chest tube removed with no complications. White sutured tighted. Occulsive tegaderm placed over CT site.  VSS. IS education complete, pt performed well. Bedrest until 1130.  Plan up to chair for lunch after bedrest. PT to eval patient--frequent falls at home, very impulsive and hasty. Continue IS. 2V CXR ordered for 1400, potential DC afternoon.

## 2020-07-22 NOTE — TOC Transition Note (Addendum)
Transition of Care [facility]) - CM/SW Discharge Note   Patient Details  Name: Yvonne Jordan MRN: [id number] Date of Birth: [birth year]  Transition of Care [facility]) CM/SW Contact:  Zenon Mayo, RN Phone Number: 07/22/2020, 2:37 PM   Clinical Narrative:    NCM offered choice [facility] HHPT, she chose [facility], referral made to [facility] for [place], she is able to take referral.  Soc will begin 24 to 48 hrs post dc. Patient states she has a walker at home, she does not need a walker.   Final next level of care: [place] Barriers to Discharge: No Barriers Identified   Patient Goals and CMS Choice Patient states their goals for this hospitalization and ongoing recovery are:: get better CMS Medicare.gov Compare Post Acute Care list provided to:: Patient Choice offered to / list presented to : Patient  Discharge Placement                       Discharge Plan and Services                          HH Arranged: PT [facility] Agency: [place] ([facility]) Date [place]: 07/22/20 Time [place]: 2683 Representative spoke with at [place]: [place] ([place]) Interventions     Readmission Risk Interventions No flowsheet data found.

## 2020-07-22 NOTE — Evaluation (Signed)
Physical Therapy Evaluation Patient Details Name: Yvonne Jordan MRN: [id number] DOB: [birth year] Today's Date: 07/22/2020   History of Present Illness  Pt is a 74 yo female that is s/p Thoracscopy-Wedge Resection of Right lung Lobes. Pt has a PMH of TIA, CKD, HLD, and breast cancer. Pt has a hx of falls   Clinical Impression  Pt was evaluated today for above diagnosis and the impairments listed below. Pt required supervision for bed mobility and min guard assist with transfers and ambulation. Pt impulsive with mobility tasks and required safety cues. Pt was educated on incentive spirometer. Pt lives with husband who is [place] bound at home, reports cousins will help at d/c. Recommend HHPT for this pt. Pt would continue to benefit from acute therapy in order to ensure safety with mobility and functional tasks. Will continue to follow acutely.     Follow Up Recommendations Home health PT;Supervision for mobility/OOB    Equipment Recommendations  None recommended by PT    Recommendations for Other Services       Precautions / Restrictions Precautions Precautions: Fall Restrictions Weight Bearing Restrictions: No      Mobility  Bed Mobility Overal bed mobility: Needs Assistance Bed Mobility: Supine to Sit     Supine to sit: Supervision     General bed mobility comments: Pt required supervision with supine to sit. pt impulsive to sit at EOB and required safety cues.   Transfers Overall transfer level: Needs assistance Equipment used: Rolling walker (2 wheeled) Transfers: Sit to/from Stand Sit to Stand: Min guard         General transfer comment: pt required min guard assist and RW with sit<>stand transfer. Pt required cueing for hand placement for power up to standing with RW  Ambulation/Gait Ambulation/Gait assistance: Min guard Gait Distance (Feet): 125 Feet Assistive device: Rolling walker (2 wheeled) Gait Pattern/deviations: Step-through pattern;Decreased step  length - right;Decreased step length - left;Decreased dorsiflexion - right;Decreased dorsiflexion - left Gait velocity: decreased   General Gait Details: pt required cuing for ambulation with RW for safety and proximity to device. Pt demonstrated decreased step length and DF with heel contact  Stairs            Wheelchair Mobility    Modified Rankin (Stroke Patients Only)       Balance Overall balance assessment: Needs assistance Sitting-balance support: Feet supported;No upper extremity supported Sitting balance-Leahy Scale: Fair     Standing balance support: Bilateral upper extremity supported;During functional activity Standing balance-Leahy Scale: Poor Standing balance comment: pt required BUE support on RW                 Pertinent Vitals/Pain Pain Assessment: No/denies pain    Home Living Family/patient expects to be discharged to:: Private residence Living Arrangements: Spouse/significant other Available Help at Discharge: Family Type of Home: House Home Access: Ramped entrance     Home Layout: Able to live on main level with bedroom/bathroom Home Equipment: Grab bars - tub/shower;Shower seat - built in;Walker - 4 wheels (rollator) Additional Comments: pt has husband that lives with her however he is [place] bound. Pt has cousins that will be available to assist her once she is home    Prior Function Level of Independence: Independent with assistive device(s)         Comments: pt utilized rollator for ambulation     Hand Dominance        Extremity/Trunk Assessment   Upper Extremity Assessment Upper Extremity Assessment: Overall WFL for tasks  assessed    Lower Extremity Assessment Lower Extremity Assessment: Generalized weakness    Cervical / Trunk Assessment Cervical / Trunk Assessment: Kyphotic  Communication   Communication: No difficulties  Cognition Arousal/Alertness: Awake/alert Behavior During Therapy: Impulsive Overall Cognitive  Status: No family/caregiver present to determine baseline cognitive functioning Area of Impairment: Safety/judgement;Memory            Memory: Decreased short-term memory   Safety/Judgement: Decreased awareness of safety     General Comments: pt had decreased awareness of safety, pt was impulsive with getting to EOB too quickly and attempting to stand without PT assist.       General Comments      Exercises     Assessment/Plan    PT Assessment Patient needs continued PT services  PT Problem List Decreased strength;Decreased range of motion;Decreased balance;Decreased activity tolerance;Decreased mobility;Decreased coordination;Decreased knowledge of use of DME;Decreased safety awareness       PT Treatment Interventions DME instruction;Gait training;Functional mobility training;Therapeutic activities;Therapeutic exercise;Balance training;Patient/family education    PT Goals (Current goals can be found in the Care Plan section)  Acute Rehab PT Goals Patient Stated Goal: get back home PT Goal Formulation: With patient Time For Goal Achievement: 07/22/20 Potential to Achieve Goals: Good    Frequency Min 3X/week   Barriers to discharge        Co-evaluation               AM-PAC PT "6 Clicks" Mobility  Outcome Measure Help needed turning from your back to your side while in a flat bed without using bedrails?: None Help needed moving from lying on your back to sitting on the side of a flat bed without using bedrails?: None Help needed moving to and from a bed to a chair (including a wheelchair)?: A Little Help needed standing up from a chair using your arms (e.g., wheelchair or bedside chair)?: A Little Help needed to walk in hospital room?: A Little Help needed climbing 3-5 steps with a railing? : A Lot 6 Click Score: 19    End of Session Equipment Utilized During Treatment: Gait belt Activity Tolerance: Patient tolerated treatment well Patient left: in  chair;with call Denora Wysocki/phone within reach;with chair alarm set Nurse Communication: Mobility status PT Visit Diagnosis: Unsteadiness on feet (R26.81);Repeated falls (R29.6);History of falling (Z91.81)    Time: 2035-5974 PT Time Calculation (min) (ACUTE ONLY): 24 min   Charges:   PT Evaluation $PT Eval Moderate Complexity: 1 Mod PT Treatments $Gait Training: 8-22 mins       Gloriann Loan, SPT  Acute Rehabilitation Services  Office: [phone]  07/22/2020, 3:26 PM

## 2020-07-22 NOTE — Progress Notes (Signed)
Pt assessed for gait and stability this session. Some memory deficits noted. Recommending HHPT at d/c. Reports her cousins are coming into assist and pt has rollator at home. Formal note to follow.   Reuel Derby, PT, DPT  Acute Rehabilitation Services  Pager: [phone] Office: [phone]

## 2020-07-22 NOTE — Discharge Instructions (Signed)
Robot-Assisted Thoracic Surgery, Care After This sheet gives you information about how to care for yourself after your procedure. Your health care provider may also give you more specific instructions. If you have problems or questions, contact your health care provider. What can I expect after the procedure? After the procedure, it is common to have:  Some pain and aches in the area of your surgical cuts (incisions).  Pain when breathing in (inhaling) and coughing.  Tiredness (fatigue).  Trouble sleeping.  Constipation. Follow these instructions at home: Medicines  Take over-the-counter and prescription medicines only as told by your health care provider.  If you were prescribed an antibiotic medicine, take it as told by your health care provider. Do not stop taking the antibiotic even if you start to feel better.  Talk with your health care provider about safe and effective ways to manage pain after your procedure. Pain management should fit your specific health needs.  Take prescription pain medicine before pain becomes severe. Relieving and controlling your pain will make breathing easier for you. Activity  Return to your normal activities as told by your health care provider. Ask your health care provider what activities are safe for you.  Do not lift anything that is heavier than 10 lb (4.5 kg), or the limit that you are told, until your health care provider says that it is safe.  Avoid sitting for a long time without moving. Get up and move around one or more times every few hours. Bathing  Do not take baths, swim, or use a hot tub until your health care provider approves. You may take showers. Incision care  Follow instructions from your health care provider about how to take care of your incision(s). Make sure you: ? Wash your hands with soap and water before you change your bandage (dressing). If soap and water are not available, use hand sanitizer. ? Change your  dressing as told by your health care provider. ? Leave stitches (sutures), skin glue, or adhesive strips in place. These skin closures may need to stay in place for 2 weeks or longer. If adhesive strip edges start to loosen and curl up, you may trim the loose edges. Do not remove adhesive strips completely unless your health care provider tells you to do that.  Check your incision area every day for signs of infection. Check for: ? Redness, swelling, or pain. ? Fluid or blood. ? Warmth. ? Pus or a bad smell. Driving  Ask your health care provider when it is safe for you to drive.  Do not drive or use heavy machinery while taking prescription pain medicine. Eating and drinking  Follow instructions from your health care provider about eating or drinking restrictions. These will vary depending on what procedure you had. Your health care provider may recommend: ? A liquid diet or soft diet for the first few days. ? Meals that are smaller and more frequent. ? A diet of fruits, vegetables, whole grains, and low-fat proteins. ? Limiting foods that are high in processed sugar and fat, including fried and sweet foods. Pneumonia prevention   Do not use any products that contain nicotine or tobacco, such as cigarettes and e-cigarettes. If you need help quitting, ask your health care provider.  Avoid secondhand smoke.  Do deep breathing exercises and cough regularly as directed. This helps to clear mucus and prevent pneumonia. If it hurts to cough, try one of these methods to ease your pain when you cough: ? Hold a   pillow against your chest. ? Place the palms of both hands over your incisions (use splinting).  Use an incentive spirometer as directed. This device measures how much air your lungs are getting with each breath. Using this will improve your breathing.  Do pulmonary rehabilitation as directed. This is a program that includes exercise, education, and support. General  instructions  Wear compression stockings as told by your health care provider. These stockings help to prevent blood clots and reduce swelling in your legs.  If you have a drainage tube: ? Follow instructions from your health care provider about how to take care of it. ? Do not travel by airplane after your tube is removed until your health care provider tells you it is safe.  To prevent or treat constipation while you are taking prescription pain medicine, your health care provider may recommend that you: ? Drink enough fluid to keep your urine pale yellow. ? Take over-the-counter or prescription medicines. ? Eat foods that are high in fiber, such as fresh fruits and vegetables, whole grains, and beans. ? Limit foods that are high in fat and processed sugars, such as fried and sweet foods.  Keep all follow-up visits as told by your health care provider. This is important. Contact a health care provider if:  You have redness, swelling, or pain around an incision.  You have fluid or blood coming from an incision.  An incision feels warm to the touch.  You have pus or a bad smell coming from an incision.  You have a fever.  You cannot eat or drink without vomiting.  Your prescription pain medicine is not controlling your pain. Get help right away if:  You have chest pain.  Your heart is beating quickly.  You have trouble breathing.  You have trouble speaking.  You are confused.  You feel weak or dizzy, or you faint. These symptoms may represent a serious problem that is an emergency. Do not wait to see if the symptoms will go away. Get medical help right away. Call your local emergency services (911 in the [place] Do not drive yourself to the hospital. Summary  Talk with your health care provider about safe and effective ways to manage pain after your procedure. Pain management should fit your specific health needs.  Return to your normal activities as told by your health  care provider. Ask your health care provider what activities are safe for you.  Do deep breathing exercises and cough regularly as directed. This helps to clear mucus and prevent pneumonia. If it hurts to cough, ease pain by holding a pillow against your chest or by placing the palms of both hands over your incisions (splinting). This information is not intended to replace advice given to you by your health care provider. Make sure you discuss any questions you have with your health care provider. Document Revised: 10/09/2019 Document Reviewed: 04/15/2017 Elsevier Patient Education  2020 [employer]  

## 2020-07-25 ENCOUNTER — Other Ambulatory Visit: Payer: Self-pay | Admitting: Surgery

## 2020-07-25 DIAGNOSIS — R911 Solitary pulmonary nodule: Secondary | ICD-10-CM | POA: Diagnosis not present

## 2020-07-25 DIAGNOSIS — Z853 Personal history of malignant neoplasm of breast: Secondary | ICD-10-CM | POA: Diagnosis not present

## 2020-07-25 DIAGNOSIS — Z48813 Encounter for surgical aftercare following surgery on the respiratory system: Secondary | ICD-10-CM | POA: Diagnosis not present

## 2020-07-25 DIAGNOSIS — N189 Chronic kidney disease, unspecified: Secondary | ICD-10-CM | POA: Diagnosis not present

## 2020-07-25 DIAGNOSIS — Z9049 Acquired absence of other specified parts of digestive tract: Secondary | ICD-10-CM | POA: Diagnosis not present

## 2020-07-25 DIAGNOSIS — Z7902 Long term (current) use of antithrombotics/antiplatelets: Secondary | ICD-10-CM | POA: Diagnosis not present

## 2020-07-25 DIAGNOSIS — E042 Nontoxic multinodular goiter: Secondary | ICD-10-CM

## 2020-07-25 DIAGNOSIS — D126 Benign neoplasm of colon, unspecified: Secondary | ICD-10-CM | POA: Diagnosis not present

## 2020-07-25 DIAGNOSIS — R296 Repeated falls: Secondary | ICD-10-CM | POA: Diagnosis not present

## 2020-07-25 DIAGNOSIS — Z8673 Personal history of transient ischemic attack (TIA), and cerebral infarction without residual deficits: Secondary | ICD-10-CM | POA: Diagnosis not present

## 2020-07-25 DIAGNOSIS — E559 Vitamin D deficiency, unspecified: Secondary | ICD-10-CM | POA: Diagnosis not present

## 2020-07-25 DIAGNOSIS — E785 Hyperlipidemia, unspecified: Secondary | ICD-10-CM | POA: Diagnosis not present

## 2020-07-25 DIAGNOSIS — K449 Diaphragmatic hernia without obstruction or gangrene: Secondary | ICD-10-CM | POA: Diagnosis not present

## 2020-07-25 DIAGNOSIS — Z9181 History of falling: Secondary | ICD-10-CM | POA: Diagnosis not present

## 2020-07-25 DIAGNOSIS — K219 Gastro-esophageal reflux disease without esophagitis: Secondary | ICD-10-CM | POA: Diagnosis not present

## 2020-07-25 DIAGNOSIS — M2550 Pain in unspecified joint: Secondary | ICD-10-CM | POA: Diagnosis not present

## 2020-07-25 DIAGNOSIS — E041 Nontoxic single thyroid nodule: Secondary | ICD-10-CM | POA: Diagnosis not present

## 2020-07-25 DIAGNOSIS — Z79891 Long term (current) use of opiate analgesic: Secondary | ICD-10-CM | POA: Diagnosis not present

## 2020-07-25 DIAGNOSIS — J849 Interstitial pulmonary disease, unspecified: Secondary | ICD-10-CM | POA: Diagnosis not present

## 2020-07-25 DIAGNOSIS — J309 Allergic rhinitis, unspecified: Secondary | ICD-10-CM | POA: Diagnosis not present

## 2020-07-28 ENCOUNTER — Encounter: Payer: Self-pay | Admitting: *Deleted

## 2020-07-28 ENCOUNTER — Other Ambulatory Visit: Payer: Self-pay | Admitting: *Deleted

## 2020-07-28 NOTE — Patient Outreach (Signed)
[facility]) Care Management [facility] CM Telephone Outreach, EMMI Red-Alert notification/ General Discharge PCP Office completes Transition of Care follow up post-hospital discharge Post-hospital discharge day # 6  07/28/2020  Jennell Janosik 09-19-1946 [id number]  EMMI Red-Alert notification/ General Discharge EMMI call date/ day #:  Wednesday July 27, 2020; day # 4 Red-Alert reason(s): "Timmothy Sours' Know" if received discharge papers; "Don't know" who to call for changes in condition  Unsuccessful initial telephone outreach attempt to Corie Chiquito, 74 y/o female referred to [facility] RN CM this morning by [place]  for EMMI Red-Alert notification as above; patient had recent elective surgery/ hospitalization July 29-30, 2021 for RAT with 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  Patient has history including, but not limited to, HLD; ILD; CKD; GERD.  With call attempt today, unable to leave patient HIPAA compliant voice mail message requesting return call back, as patient's automated outgoing message states that her voice mail box is full and unable to accept messages  Plan:  Will place [facility] CM unsuccessful patient outreach letter in mail requesting call back in writing  Will re-attempt [facility] CM telephone outreach within 4 business days if I do not hear back from patient first  Oneta Rack, RN, BSN, [employer] Coordinator [facility] Care Management  [phone]

## 2020-07-29 ENCOUNTER — Ambulatory Visit (INDEPENDENT_AMBULATORY_CARE_PROVIDER_SITE_OTHER): Payer: Self-pay | Admitting: Thoracic Surgery (Cardiothoracic Vascular Surgery)

## 2020-07-29 ENCOUNTER — Other Ambulatory Visit: Payer: Self-pay

## 2020-07-29 ENCOUNTER — Encounter: Payer: Self-pay | Admitting: Thoracic Surgery (Cardiothoracic Vascular Surgery)

## 2020-07-29 VITALS — BP 150/89 | HR 87 | Temp 97.9°F | Resp 20 | Ht 65.0 in | Wt 186.0 lb

## 2020-07-29 DIAGNOSIS — Z09 Encounter for follow-up examination after completed treatment for conditions other than malignant neoplasm: Secondary | ICD-10-CM

## 2020-07-29 DIAGNOSIS — J849 Interstitial pulmonary disease, unspecified: Secondary | ICD-10-CM

## 2020-07-29 NOTE — Progress Notes (Signed)
      [address]       [place] [address]             [phone]        Miquel [facility] Medical Record #[id number] Date of Birth: 1946/01/04  Referring: Marshell Garfinkel, MD Primary Care: Antony Contras, MD Primary Cardiologist:Bridgette Fabion Gatson Gave, MD  Reason for visit:   follow-up  History of Present Illness:     Mrs. Basher comes in for her first follow-up appointment.  She underwent a right robotic assisted thoracoscopy with wedge resection for diagnosis of potential interstitial lung disease.  She has no complaints today.  Her respiratory status is essentially unchanged.  She is scheduled to undergo thyroid biopsy in the next week.  Physical Exam: BP (!) 150/89   Pulse 87   Temp 97.9 F (36.6 C) (Skin)   Resp 20   Ht 5\' 5"  (1.651 m)   Wt 186 lb (84.4 kg)   SpO2 97% Comment: RA  BMI 30.95 kg/m   Alert NAD Incision clean, stitches removed. Abdomen soft, ND No peripheral edema   Diagnostic Studies & Laboratory data:  Path: A. LUNG, RIGHT LOWER LOBE, WEDGE BIOPSY:  - Lung wedge biopsy with minimal to mild emphysematous changes  - No significant fibrosis or architectural distortion  - Negative for granulomas   B. LUNG, RIGHT UPPER LOBE, WEDGE BIOPSY:  - Lung parenchyma showing a few poorly formed nonnecrotizing granulomas  and giant cells.  - Minimal to mild emphysematous change  - No significant fibrosis or architectural distortion   C. LUNG, RIGHT MIDDLE LOBE, WEDGE BIOPSY:  - Lung wedge biopsy with minimal to mild emphysematous changes  - No significant fibrosis or architectural distortion  - Negative for lymphoma    B.  Only few poorly formed nonnecrotizing granulomas and giant cells  are present with mild lymphocytic infiltrate and focal peribronchiolar  accentuation. There is no significant fibrosis. No foreign material is  seen under polarized light. Though diagnostic features are not present,  the findings can be  compatible with early hypersensitivity pneumonitis  in an appropriate clinical and radiologic setting. Differential  diagnosis can include mild aspiration.   Assessment / Plan:   74 year old female who underwent lung biopsy.  The specimens did not show any significant fibrosis or architectural distortions.  No comment there is the possibility this could be associated with mild aspiration.  Furthermore I reviewed her swallow study and esophagram from 2016 at 2015 and she does have evidence of laryngeal aspiration.  I think in the setting of a hiatal hernia she is likely aspirating gastric content on a chronic basis.  I have recommended that she undergo hiatal hernia repair hernia repair, but will discuss this further with Dr. Vaughan Browner.  If he agrees then we will schedule her for this operation.    Lajuana Matte 07/29/2020 2:44 PM

## 2020-08-01 ENCOUNTER — Ambulatory Visit
Admission: RE | Admit: 2020-08-01 | Discharge: 2020-08-01 | Disposition: A | Discharge status: Home / Self Care | Payer: Medicare Other | Source: Ambulatory Visit | Attending: Surgery | Admitting: Surgery

## 2020-08-01 DIAGNOSIS — E042 Nontoxic multinodular goiter: Secondary | ICD-10-CM

## 2020-08-01 DIAGNOSIS — E041 Nontoxic single thyroid nodule: Secondary | ICD-10-CM | POA: Diagnosis not present

## 2020-08-01 IMAGING — US US THYROID
2 series · 13 of 25 positions shown · non-contrast
Comparison: None.

CLINICAL DATA: Multiple thyroid nodules

EXAM:
THYROID ULTRASOUND
TECHNIQUE: Ultrasound examination of the thyroid gland and adjacent soft
tissues was performed.

[Series 1: us thyroid · 0.07mm/px · 12 of 70 slices shown (1 of 2)]
[im 1/70]
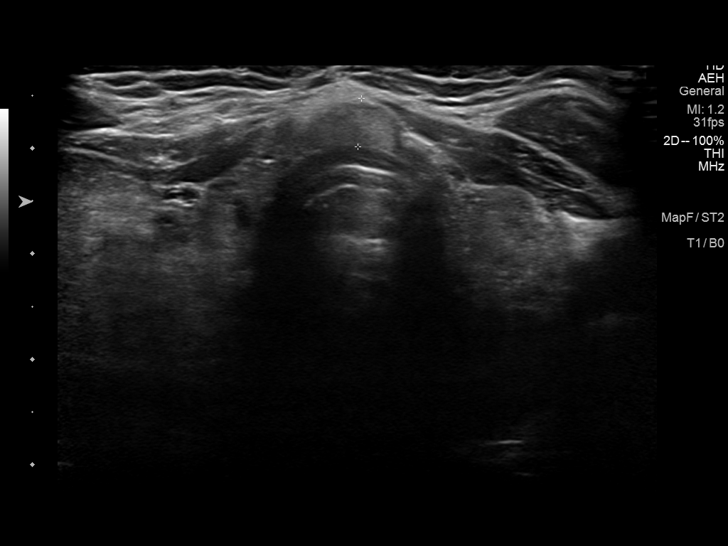
[im 7/70]
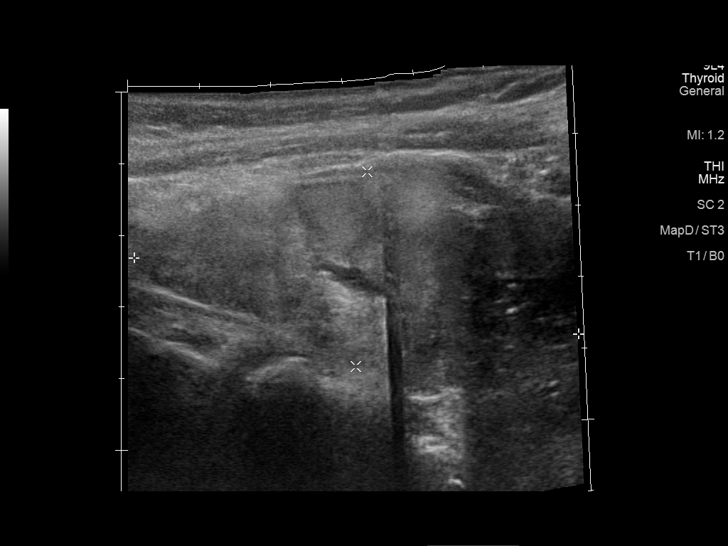
[im 13/70]
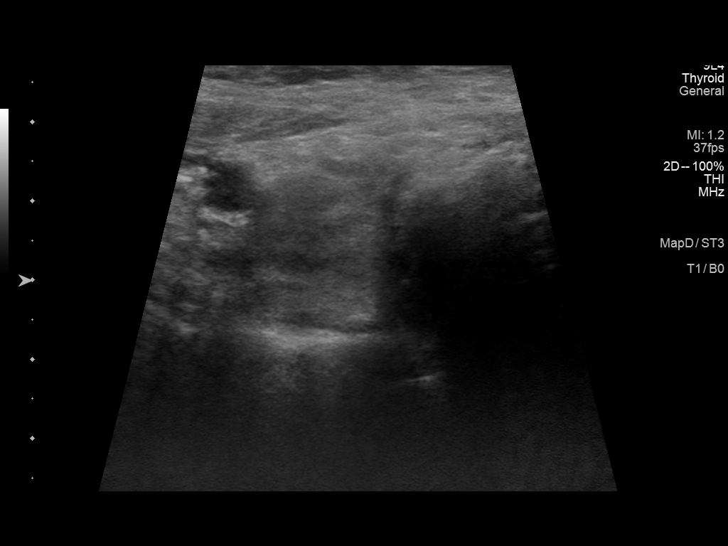
[im 19/70]
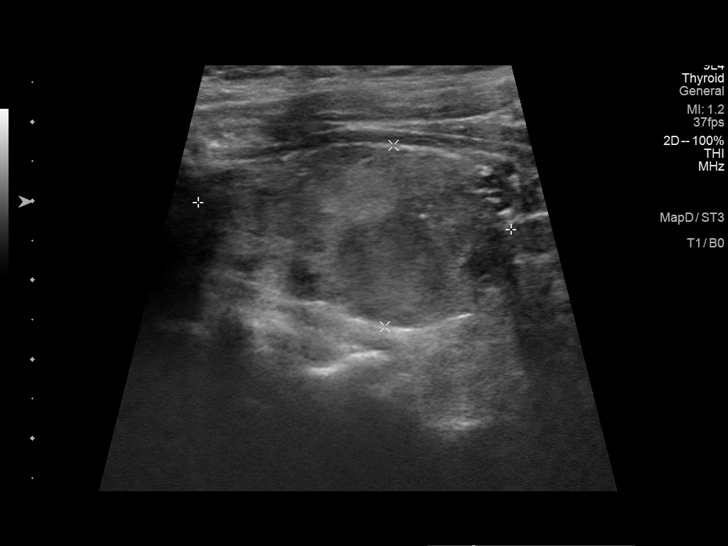
[im 25/70]
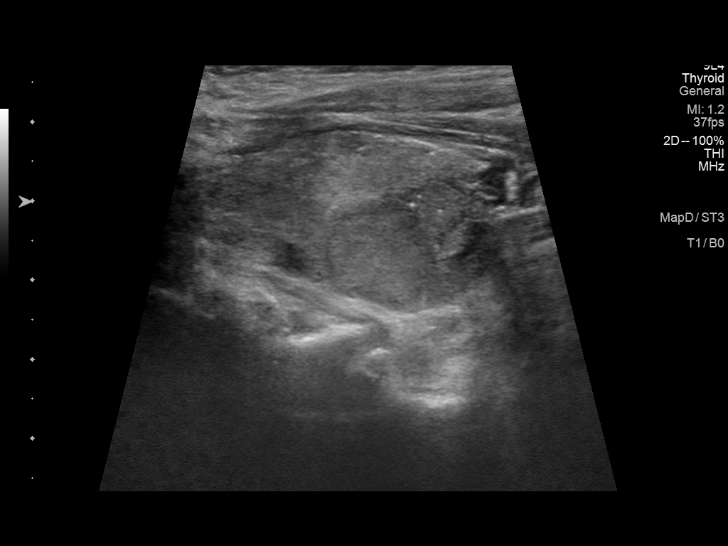
[im 31/70]
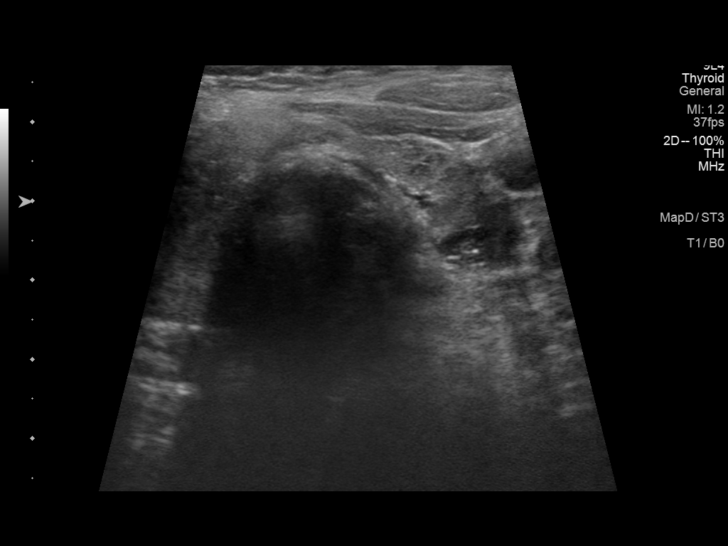
[im 37/70]
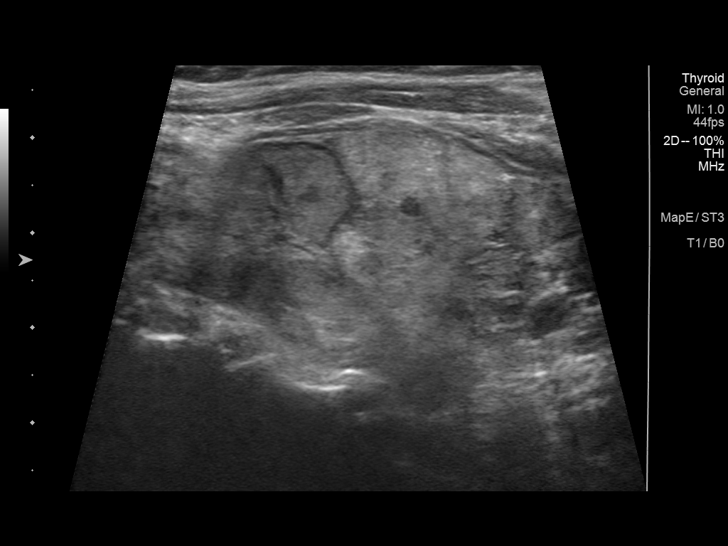
[im 43/70]
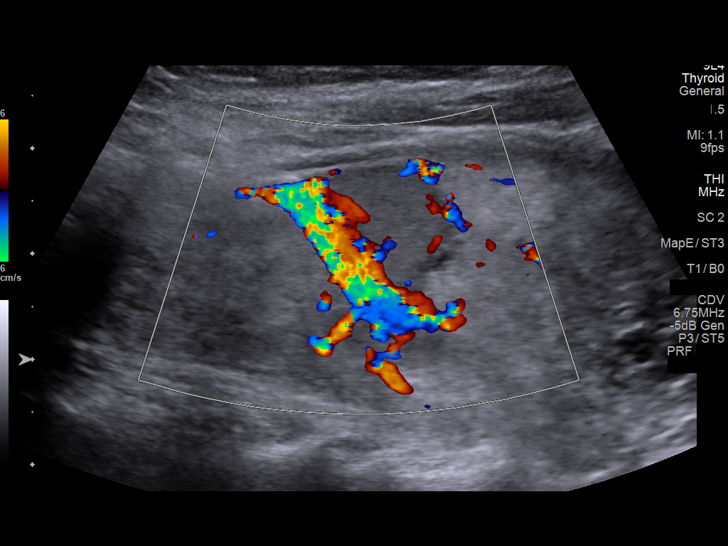
[im 49/70]
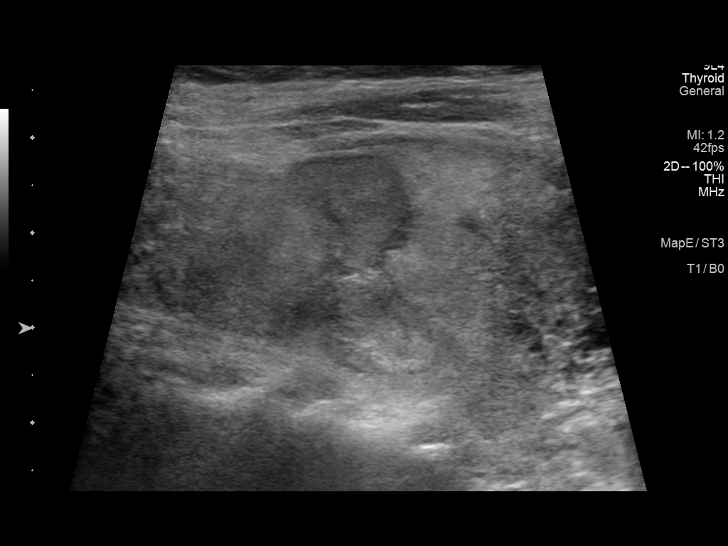
[im 55/70]
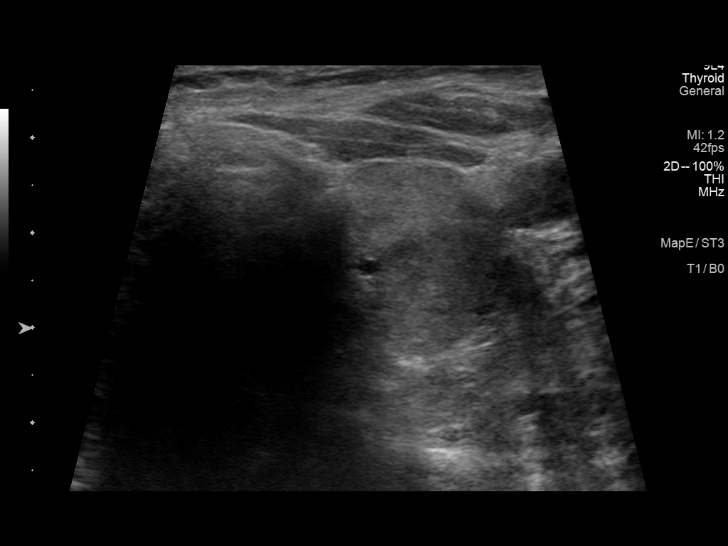
[im 61/70]
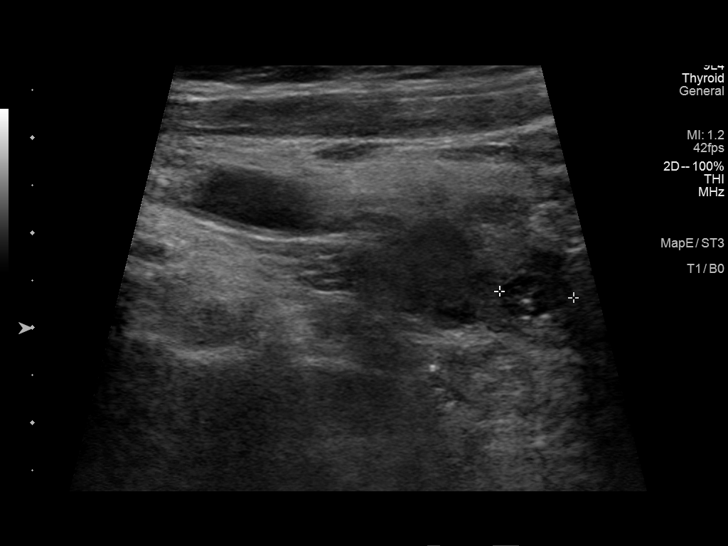
[im 67/70]
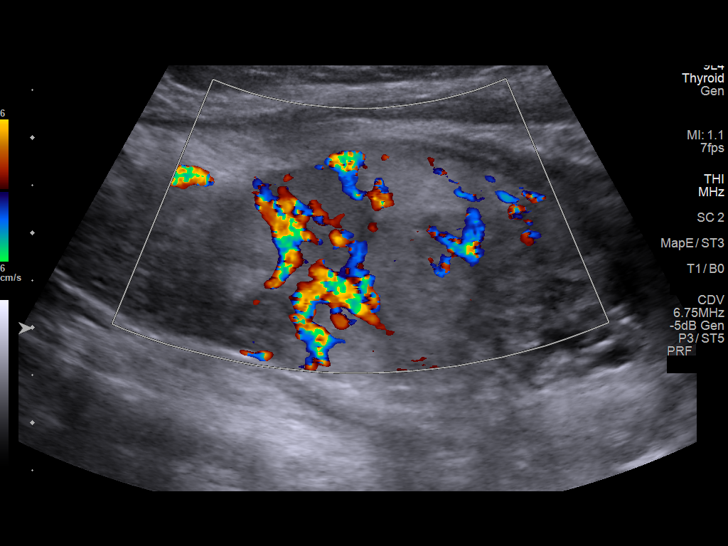

[Series 2001: us thyroid · 0.09mm/px · 1 of 1 slices shown (2 of 2)]
[im 1/1]
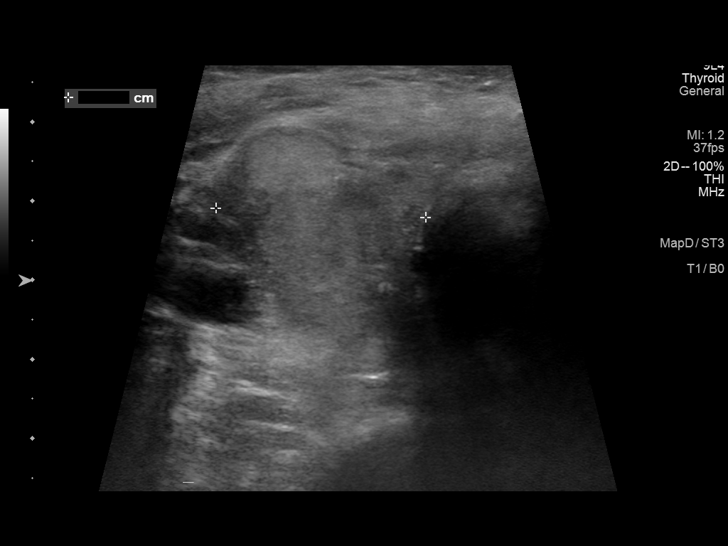

[13 of 25 positions shown; findings below may reference images not displayed]

FINDINGS: Parenchymal Echotexture: Markedly heterogenous

Isthmus: 0.5 cm

Right lobe: 6.3 x 2.7 x 2.7 cm

Left lobe: Is 4 x 2.3 x 1.7 cm

_________________________________________________________

Estimated total number of nodules >/= 1 cm: 2

Number of spongiform nodules >/=  2 cm not described below (TR1): 0

Number of mixed cystic and solid nodules >/= 1.5 cm not described
below (TR2): 0

_________________________________________________________

Nodule # 1:

Location: Right; Inferior

Maximum size: 1.5 cm; Other 2 dimensions: 1.2 x 0.9 cm

Composition: mixed cystic and solid (1)

Echogenicity: isoechoic (1)

Shape: not taller-than-wide (0)

Margins: ill-defined (0)

Echogenic foci: none (0)

ACR TI-RADS total points: 2.

ACR TI-RADS risk category: TR2 (2 points).

ACR TI-RADS recommendations:

This nodule does NOT meet TI-RADS criteria for biopsy or dedicated
follow-up.

_________________________________________________________

Nodule # 2:

Location: Left; Mid

Maximum size: 1.4 cm; Other 2 dimensions: 1.3 x 1.3 cm

Composition: solid/almost completely solid (2)

Echogenicity: hypoechoic (2)

Shape: not taller-than-wide (0)

Margins: smooth (0)

Echogenic foci: none (0)

ACR TI-RADS total points: 4.

ACR TI-RADS risk category: TR4 (4-6 points).

ACR TI-RADS recommendations:

*Given size (>/= 1 - 1.4 cm) and appearance, a follow-up ultrasound
in 1 year should be considered based on TI-RADS criteria.

_________________________________________________________

There is an additional smaller subcentimeter 0.8 cm thyroid nodule
in the left inferior thyroid gland that does not meet criteria for
follow-up or FNA.
IMPRESSION: 1. Enlarged, markedly heterogeneous thyroid gland.
2. There is a 1.4 cm TR4 thyroid nodule in the left mid thyroid
gland. A 1 year follow-up ultrasound is recommended for this thyroid
nodule.

The above is in keeping with the ACR TI-RADS recommendations - [HOSPITAL] [YF];[DATE].

## 2020-08-02 ENCOUNTER — Ambulatory Visit: Payer: Self-pay | Admitting: Pulmonary Disease

## 2020-08-02 ENCOUNTER — Encounter: Payer: Self-pay | Admitting: *Deleted

## 2020-08-02 ENCOUNTER — Other Ambulatory Visit: Payer: Self-pay | Admitting: *Deleted

## 2020-08-02 DIAGNOSIS — T148XXA Other injury of unspecified body region, initial encounter: Secondary | ICD-10-CM | POA: Diagnosis not present

## 2020-08-02 DIAGNOSIS — K219 Gastro-esophageal reflux disease without esophagitis: Secondary | ICD-10-CM | POA: Diagnosis not present

## 2020-08-02 DIAGNOSIS — J849 Interstitial pulmonary disease, unspecified: Secondary | ICD-10-CM | POA: Diagnosis not present

## 2020-08-02 DIAGNOSIS — K449 Diaphragmatic hernia without obstruction or gangrene: Secondary | ICD-10-CM | POA: Diagnosis not present

## 2020-08-02 DIAGNOSIS — R5381 Other malaise: Secondary | ICD-10-CM | POA: Diagnosis not present

## 2020-08-02 NOTE — Patient Outreach (Addendum)
[place] [facility]) Care Management THN CM Telephone Outreach,  PCP office completes Transition of Care follow up post-hospital discharge Post-hospital discharge day # 11 Unsuccessful (consecutive) outreach attempt # 2- new referral  08/02/2020  [name] [birth year] [id number]  EMMI Red-Alert notification/ General Discharge EMMI call date/ day #:  Wednesday July 27, 2020; day # 4 Red-Alert reason(s): "Timmothy Sours' Know" if received discharge papers; "Don't know" who to call for changes in condition  Unsuccessful (consecutive) second telephone outreach attempt to Yvonne Jordan, 74 y/o female referred to [facility] RN CM this morning by [place]  for EMMI Red-Alert notification as above; patient had recent elective surgery/ hospitalization July 29-30, 2021 for RAT with wedge resection of (R) upper/ middle lobe; she was discharged home with home health services through [facility] services.  Patient has history including, but not limited to, HLD; ILD; CKD; GERD.  HIPAA compliant voice mail message left for patient, requesting return call back  Plan:  Attempted to verift [facility] CM unsuccessful patient outreach letter in mail requesting call back in writing on 07/28/20, however, this was not confirmed-- will re-send unsuccessful outreach letter to patient today  Will re-attempt THN CM telephone outreach within 4 business days if I do not hear back from patient first  Oneta Rack, RN, BSN, [employer] Coordinator [facility] Care Management  [phone]

## 2020-08-04 DIAGNOSIS — N189 Chronic kidney disease, unspecified: Secondary | ICD-10-CM | POA: Diagnosis not present

## 2020-08-04 DIAGNOSIS — J309 Allergic rhinitis, unspecified: Secondary | ICD-10-CM | POA: Diagnosis not present

## 2020-08-04 DIAGNOSIS — J849 Interstitial pulmonary disease, unspecified: Secondary | ICD-10-CM | POA: Diagnosis not present

## 2020-08-04 DIAGNOSIS — K219 Gastro-esophageal reflux disease without esophagitis: Secondary | ICD-10-CM | POA: Diagnosis not present

## 2020-08-04 DIAGNOSIS — R911 Solitary pulmonary nodule: Secondary | ICD-10-CM | POA: Diagnosis not present

## 2020-08-04 DIAGNOSIS — Z48813 Encounter for surgical aftercare following surgery on the respiratory system: Secondary | ICD-10-CM | POA: Diagnosis not present

## 2020-08-07 NOTE — Progress Notes (Signed)
   Interstitial Lung Disease Multidisciplinary Conference   Yvonne Jordan    MRN [id number]    DOB 1946/09/15  Primary Care Physician:Swayne, Shanon Brow, MD  Referring Physician: Dr. Marshell Garfinkel MD  Time of Conference: 7.30am- 8.30am Date of conference: 08/02/2013 Location of Conference: -  Virtual  Participating Pulmonary:Dr Marshell Garfinkel, MD Pathology: Dr Jaquita Folds, MD Radiology: Dr Eddie Candle  Others:  Brief History: Work up for dyspnea, alternate diagnosis on CT scan Has mold exposure and ANA 1:80, nuclear on labs May be IPF, HP, CTD NSIP or aspiration as she has GERD and esophageal dysmotility.  s/p lung biopsy 7/29 Discuss CT and path  Serology:  ILD panel 06/29/2020 Negative for hypersensitivity panel, RA, CCP.  ANA 1:80, nuclear  MDD discussion of CT scan 06/17/2020 CT with mild bibasilar opacities, moderate air trapping with groundglass opacities, traction bronchiectasis with lower lobe predominance., indeterminate by ATS criteria  Pathology discussion of biopsy: 07/21/2020 date of biopsy.  Pathology slides are not available for direct visualization.Marland Kitchen  However reviewed the path report. Noted mild emphysematous changes with poorly formed granulomas, giant cells with mild lymphocytic inflammation in the upper lobes.  No fibrosis noted.  PFTs:  07/05/2020-normal PFTs  MDD Impression/Recs:  CT and pathology reviewed with mild changes.  Consistent with early hypersensitivity pneumonitis. Patient does have some mold exposure.  Main recommendation would be to avoid exposure and monitor for any progression. Although she has positive ANA the biopsy does not show NSIP.  There is no evidence of pulmonary fibrosis.  Differential diagnosis includes mild aspiration.  Patient does have esophageal dysmotility.  We can consider further therapies for this.  We will review again next month after we obtain the slides for discussion.  Time Spent in preparation and  discussion:  > 30 min    SIGNATURE   Coby Shrewsberry MD [facility] Please see [email or web address] for pager details.  08/07/2020, 8:18 AM ...................................................................................................................Marland Kitchen References: Diagnosis of Hypersensitivity Pneumonitis in Adults. An Official ATS/JRS/ALAT Clinical Practice Guideline. Ragu G et al, [place] Aug 1;202(3):e36-e69.

## 2020-08-08 ENCOUNTER — Other Ambulatory Visit: Payer: Self-pay | Admitting: *Deleted

## 2020-08-08 ENCOUNTER — Encounter: Payer: Self-pay | Admitting: *Deleted

## 2020-08-08 NOTE — Patient Outreach (Signed)
[place] [facility]) Care Management THN CM Telephone Outreach, follow up [place] notification from 07/28/20 PCP office completes Transition of Care follow up post-hospital discharge Post-hospital discharge day # 17 Unsuccessful (consecutive) third outreach attempt- new patient  08/08/2020  Jametta Moorehead 1946-02-15 [id number]  EMMI Red-Alert notification/ General Discharge EMMI call date/ day #:Wednesday July 27, 2020; day # 4 Red-Alert reason(s):"Don' Know" if received discharge papers; "Don't know" who to call for changes in condition  Unsuccessful (consecutive) third telephone outreachattemptto Corie Chiquito, 74 y/o female referred to [facility] RN CM 07/28/20 by [facility] CMA for [place] notification as above; patient had recent elective surgery/ hospitalization July 29-30, 2074for RAT with wedge resection of (R) upper/ middle lobe; she was discharged home with home health services through [facility] services. Patient has history including, but not limited to, HLD; ILD; CKD; GERD.  HIPAA compliant voice mail message left for patient, requesting return call back  Plan:  Verified [facility] CM unsuccessful patient outreach letter in mail requesting call back in writing on 08/02/20  Will re-attempt THN CM telephone outreachin 3 weeksif I do not hear back from patient first  Oneta Rack, RN, BSN, [facility] Care Management  [phone]

## 2020-08-10 DIAGNOSIS — R911 Solitary pulmonary nodule: Secondary | ICD-10-CM | POA: Diagnosis not present

## 2020-08-10 DIAGNOSIS — J309 Allergic rhinitis, unspecified: Secondary | ICD-10-CM | POA: Diagnosis not present

## 2020-08-10 DIAGNOSIS — K219 Gastro-esophageal reflux disease without esophagitis: Secondary | ICD-10-CM | POA: Diagnosis not present

## 2020-08-10 DIAGNOSIS — J849 Interstitial pulmonary disease, unspecified: Secondary | ICD-10-CM | POA: Diagnosis not present

## 2020-08-10 DIAGNOSIS — Z48813 Encounter for surgical aftercare following surgery on the respiratory system: Secondary | ICD-10-CM | POA: Diagnosis not present

## 2020-08-10 DIAGNOSIS — N189 Chronic kidney disease, unspecified: Secondary | ICD-10-CM | POA: Diagnosis not present

## 2020-08-15 DIAGNOSIS — K219 Gastro-esophageal reflux disease without esophagitis: Secondary | ICD-10-CM | POA: Diagnosis not present

## 2020-08-15 DIAGNOSIS — Z48813 Encounter for surgical aftercare following surgery on the respiratory system: Secondary | ICD-10-CM | POA: Diagnosis not present

## 2020-08-15 DIAGNOSIS — J849 Interstitial pulmonary disease, unspecified: Secondary | ICD-10-CM | POA: Diagnosis not present

## 2020-08-15 DIAGNOSIS — J309 Allergic rhinitis, unspecified: Secondary | ICD-10-CM | POA: Diagnosis not present

## 2020-08-15 DIAGNOSIS — E042 Nontoxic multinodular goiter: Secondary | ICD-10-CM | POA: Diagnosis not present

## 2020-08-15 DIAGNOSIS — R911 Solitary pulmonary nodule: Secondary | ICD-10-CM | POA: Diagnosis not present

## 2020-08-15 DIAGNOSIS — N189 Chronic kidney disease, unspecified: Secondary | ICD-10-CM | POA: Diagnosis not present

## 2020-08-18 DIAGNOSIS — E042 Nontoxic multinodular goiter: Secondary | ICD-10-CM | POA: Diagnosis not present

## 2020-08-23 DIAGNOSIS — J849 Interstitial pulmonary disease, unspecified: Secondary | ICD-10-CM | POA: Diagnosis not present

## 2020-08-23 DIAGNOSIS — R911 Solitary pulmonary nodule: Secondary | ICD-10-CM | POA: Diagnosis not present

## 2020-08-23 DIAGNOSIS — N189 Chronic kidney disease, unspecified: Secondary | ICD-10-CM | POA: Diagnosis not present

## 2020-08-23 DIAGNOSIS — J309 Allergic rhinitis, unspecified: Secondary | ICD-10-CM | POA: Diagnosis not present

## 2020-08-23 DIAGNOSIS — K219 Gastro-esophageal reflux disease without esophagitis: Secondary | ICD-10-CM | POA: Diagnosis not present

## 2020-08-23 DIAGNOSIS — Z48813 Encounter for surgical aftercare following surgery on the respiratory system: Secondary | ICD-10-CM | POA: Diagnosis not present

## 2020-08-24 ENCOUNTER — Ambulatory Visit (INDEPENDENT_AMBULATORY_CARE_PROVIDER_SITE_OTHER): Payer: Medicare Other | Admitting: Pulmonary Disease

## 2020-08-24 ENCOUNTER — Encounter: Payer: Self-pay | Admitting: Pulmonary Disease

## 2020-08-24 ENCOUNTER — Other Ambulatory Visit: Payer: Self-pay

## 2020-08-24 VITALS — BP 124/68 | HR 100 | Temp 97.8°F | Ht 65.0 in | Wt 186.0 lb

## 2020-08-24 DIAGNOSIS — E559 Vitamin D deficiency, unspecified: Secondary | ICD-10-CM | POA: Diagnosis not present

## 2020-08-24 DIAGNOSIS — N189 Chronic kidney disease, unspecified: Secondary | ICD-10-CM | POA: Diagnosis not present

## 2020-08-24 DIAGNOSIS — Z79891 Long term (current) use of opiate analgesic: Secondary | ICD-10-CM | POA: Diagnosis not present

## 2020-08-24 DIAGNOSIS — D126 Benign neoplasm of colon, unspecified: Secondary | ICD-10-CM | POA: Diagnosis not present

## 2020-08-24 DIAGNOSIS — Z48813 Encounter for surgical aftercare following surgery on the respiratory system: Secondary | ICD-10-CM | POA: Diagnosis not present

## 2020-08-24 DIAGNOSIS — J309 Allergic rhinitis, unspecified: Secondary | ICD-10-CM | POA: Diagnosis not present

## 2020-08-24 DIAGNOSIS — K449 Diaphragmatic hernia without obstruction or gangrene: Secondary | ICD-10-CM | POA: Diagnosis not present

## 2020-08-24 DIAGNOSIS — K219 Gastro-esophageal reflux disease without esophagitis: Secondary | ICD-10-CM | POA: Diagnosis not present

## 2020-08-24 DIAGNOSIS — Z9049 Acquired absence of other specified parts of digestive tract: Secondary | ICD-10-CM | POA: Diagnosis not present

## 2020-08-24 DIAGNOSIS — E785 Hyperlipidemia, unspecified: Secondary | ICD-10-CM | POA: Diagnosis not present

## 2020-08-24 DIAGNOSIS — Z9181 History of falling: Secondary | ICD-10-CM | POA: Diagnosis not present

## 2020-08-24 DIAGNOSIS — M2550 Pain in unspecified joint: Secondary | ICD-10-CM | POA: Diagnosis not present

## 2020-08-24 DIAGNOSIS — J849 Interstitial pulmonary disease, unspecified: Secondary | ICD-10-CM

## 2020-08-24 DIAGNOSIS — E041 Nontoxic single thyroid nodule: Secondary | ICD-10-CM | POA: Diagnosis not present

## 2020-08-24 DIAGNOSIS — Z7902 Long term (current) use of antithrombotics/antiplatelets: Secondary | ICD-10-CM | POA: Diagnosis not present

## 2020-08-24 DIAGNOSIS — Z8673 Personal history of transient ischemic attack (TIA), and cerebral infarction without residual deficits: Secondary | ICD-10-CM | POA: Diagnosis not present

## 2020-08-24 DIAGNOSIS — Z853 Personal history of malignant neoplasm of breast: Secondary | ICD-10-CM | POA: Diagnosis not present

## 2020-08-24 DIAGNOSIS — R911 Solitary pulmonary nodule: Secondary | ICD-10-CM | POA: Diagnosis not present

## 2020-08-24 DIAGNOSIS — R296 Repeated falls: Secondary | ICD-10-CM | POA: Diagnosis not present

## 2020-08-24 NOTE — Patient Instructions (Signed)
I am glad you are doing well with your breathing I have reviewed your biopsy with my colleagues and it shows very mild changes of possible exposure or aspiration I agree that it be reasonable to consider surgery to treat reflux  Please discuss with [place] about options for this  I will see back in clinic in 6 months.

## 2020-08-24 NOTE — Progress Notes (Signed)
Yvonne Jordan    [id number]    Mar 08, 1946  Primary Care Physician:Swayne, Shanon Brow, MD  Referring Physician: Antony Contras, MD [place],  [place] [address]  Chief complaint: Follow-up for dyspnea, unspecified interstitial lung disease  HPI: 74 year old with history of CKD, GERD Complains of shortness of breath on exertion, worsened by bending over, chewing or eating.  Denies any cough, sputum production, wheezing  Evaluated by Dr. Melvyn Novas in 2016.  Esophagram in 2015 shows laryngeal penetration with thick barium, no aspiration.  Evaluated by speech therapy in 2015.  She follows with Dr. Paulita Fujita at [place].  Cardiology evaluation recently with no significant abnormality. She has significant GERD symptoms and is on PPI 1-2 times a day.   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  No down pillows or comforters Smoking history: Never smoker Travel history: Originally from [place].  Previously lived in [place].  No significant recent travel Relevant family history: No significant family history of lung disease  Interim history: Underwent surgical lung biopsy Reviewed at multidisciplinary conference on 08/02/2020 with findings consistent with either early hypersensitivity pneumonitis or mild aspiration  States that breathing is stable with no issues  Outpatient Encounter Medications as of 08/24/2020  Medication Sig   acetaminophen (TYLENOL) 325 MG tablet Take 650 mg by mouth every 6 (six) hours as needed. At night if needed    atorvastatin (LIPITOR) 20 MG tablet    Calcium-Phosphorus-Vitamin D (CALCIUM/D3 ADULT GUMMIES PO) Take 2 tablets by mouth daily.   Cholecalciferol (VITAMIN D3) 2000 UNITS TABS Take by mouth.    clopidogrel (PLAVIX) 75 MG tablet Take 75 mg by mouth daily with breakfast.   diclofenac Sodium  (VOLTAREN) 1 % GEL Voltaren 1 % topical gel  APPLY 2 GRAMS TO THE AFFECTED AREA(S) BY TOPICAL ROUTE 4 TIMES PER DAY   Multiple Vitamin (MULTIVITAMIN) capsule Take 1 capsule by mouth daily.   oxyCODONE (OXY IR/ROXICODONE) 5 MG immediate release tablet Take 1 tablet (5 mg total) by mouth every 4 (four) hours as needed for severe pain.   pantoprazole (PROTONIX) 40 MG tablet Take 1 tablet (40 mg total) by mouth 2 (two) times daily before a meal. For 30 days (Patient taking differently: Take 40 mg by mouth daily. For 30 days)   Polyethyl Glycol-Propyl Glycol (SYSTANE OP) Place 1 drop into both eyes 2 (two) times daily as needed (dry eyes).   tretinoin (RETIN-A) 0.025 % cream Apply 1 application topically at bedtime.   No facility-administered encounter medications on file as of 08/24/2020.   Physical Exam: Blood pressure 124/68, pulse 100, temperature 97.8 F (36.6 C), temperature source Temporal, height 5\' 5"  (1.651 m), weight 186 lb (84.4 kg), SpO2 98 %. Gen:      No acute distress HEENT:  EOMI, sclera anicteric Neck:     No masses; no thyromegaly Lungs:    Clear to auscultation bilaterally; normal respiratory effort CV:         Regular rate and rhythm; no murmurs Abd:      + bowel sounds; soft, non-tender; no palpable masses, no distension Ext:    No edema; adequate peripheral perfusion Skin:      Warm and dry; no rash Neuro: alert and oriented x 3 Psych: normal mood and affect  Data  Reviewed: Imaging: CT coronaries 05/07/2019-visualized lungs show mild atelectasis/scarring at the base. High-resolution CT 06/17/2020-moderate air trapping with groundglass opacities, traction bronchiectasis with lower lobe predominance.  Alternate diagnosis. I have reviewed the images personally.  PFTs: 07/05/2020 FVC 2.69 [90%), FEV1 2.39 [96%], F/F 89, TLC 4.53 [86%], DLCO 16.81 [84%] Normal test  Labs: Labs from primary care 04/11/2020 CBC- WBC 7.6, hemoglobin 13, platelets 297, eos 1.2%, absolute  eosinophil count TSH 6.37 Metabolic panel significant for creatinine 1.13.  Normal liver tests.  ILD panel 06/29/2020 Negative for hypersensitivity panel, RA, CCP.  ANA 1:80, nuclear  Pathology: Surgical lung biopsy mild emphysematous changes with poorly formed granulomas, giant cells with mild lymphocytic inflammation in the upper lobes.  No fibrosis noted  Assessment:  Interstitial lung disease High-res CT reviewed with mild changes in the lower lobes in alternate pattern She does endorse some mold exposure though HP panel is negative.  ANA is borderline positive Changes may also be related to ongoing symptoms of GERD, acid reflux with aspiration as she has significant GI symptoms.  CT and pathology reviewed with mild changes.  Consistent with early hypersensitivity pneumonitis. Patient does have some mold exposure.  Main recommendation would be to avoid exposure [which she has already done by getting the mold cleaned out].  Monitor for any progression. Although she has positive ANA the biopsy does not show NSIP.  There is no evidence of pulmonary fibrosis.  Differential diagnosis includes mild aspiration.  Patient does have hiatal hernia, esophageal dysmotility and would be a reasonable candidate for surgical correction and she will follow up with Dr. Kipp Brood regarding this  Plan/Recommendations: Avoidance of exposure Follow-up with Dr. Kipp Brood about hiatal hernia repair.  Marshell Garfinkel MD [facility] 08/24/2020, 11:36 AM  CC: Antony Contras, MD

## 2020-08-25 ENCOUNTER — Other Ambulatory Visit: Payer: Self-pay | Admitting: Thoracic Surgery (Cardiothoracic Vascular Surgery)

## 2020-08-25 DIAGNOSIS — J849 Interstitial pulmonary disease, unspecified: Secondary | ICD-10-CM

## 2020-08-26 ENCOUNTER — Other Ambulatory Visit: Payer: Self-pay

## 2020-08-26 ENCOUNTER — Ambulatory Visit (INDEPENDENT_AMBULATORY_CARE_PROVIDER_SITE_OTHER): Payer: Self-pay | Admitting: Thoracic Surgery (Cardiothoracic Vascular Surgery)

## 2020-08-26 ENCOUNTER — Encounter: Payer: Self-pay | Admitting: Thoracic Surgery (Cardiothoracic Vascular Surgery)

## 2020-08-26 ENCOUNTER — Other Ambulatory Visit: Payer: Self-pay | Admitting: *Deleted

## 2020-08-26 ENCOUNTER — Ambulatory Visit
Admission: RE | Admit: 2020-08-26 | Discharge: 2020-08-26 | Disposition: A | Discharge status: Home / Self Care | Payer: Medicare Other | Source: Ambulatory Visit | Attending: Thoracic Surgery (Cardiothoracic Vascular Surgery) | Admitting: Thoracic Surgery (Cardiothoracic Vascular Surgery)

## 2020-08-26 VITALS — BP 134/82 | HR 104 | Temp 97.0°F | Resp 18 | Ht 65.0 in | Wt 185.4 lb

## 2020-08-26 DIAGNOSIS — K449 Diaphragmatic hernia without obstruction or gangrene: Secondary | ICD-10-CM

## 2020-08-26 DIAGNOSIS — R0602 Shortness of breath: Secondary | ICD-10-CM | POA: Diagnosis not present

## 2020-08-26 DIAGNOSIS — Z09 Encounter for follow-up examination after completed treatment for conditions other than malignant neoplasm: Secondary | ICD-10-CM

## 2020-08-26 DIAGNOSIS — J849 Interstitial pulmonary disease, unspecified: Secondary | ICD-10-CM

## 2020-08-26 IMAGING — CR DG CHEST 2V
2 series · 2 of 2 positions shown · non-contrast
Comparison: [DATE].

CLINICAL DATA: Shortness of breath.

EXAM:
CHEST - 2 VIEW

[w chest pa]
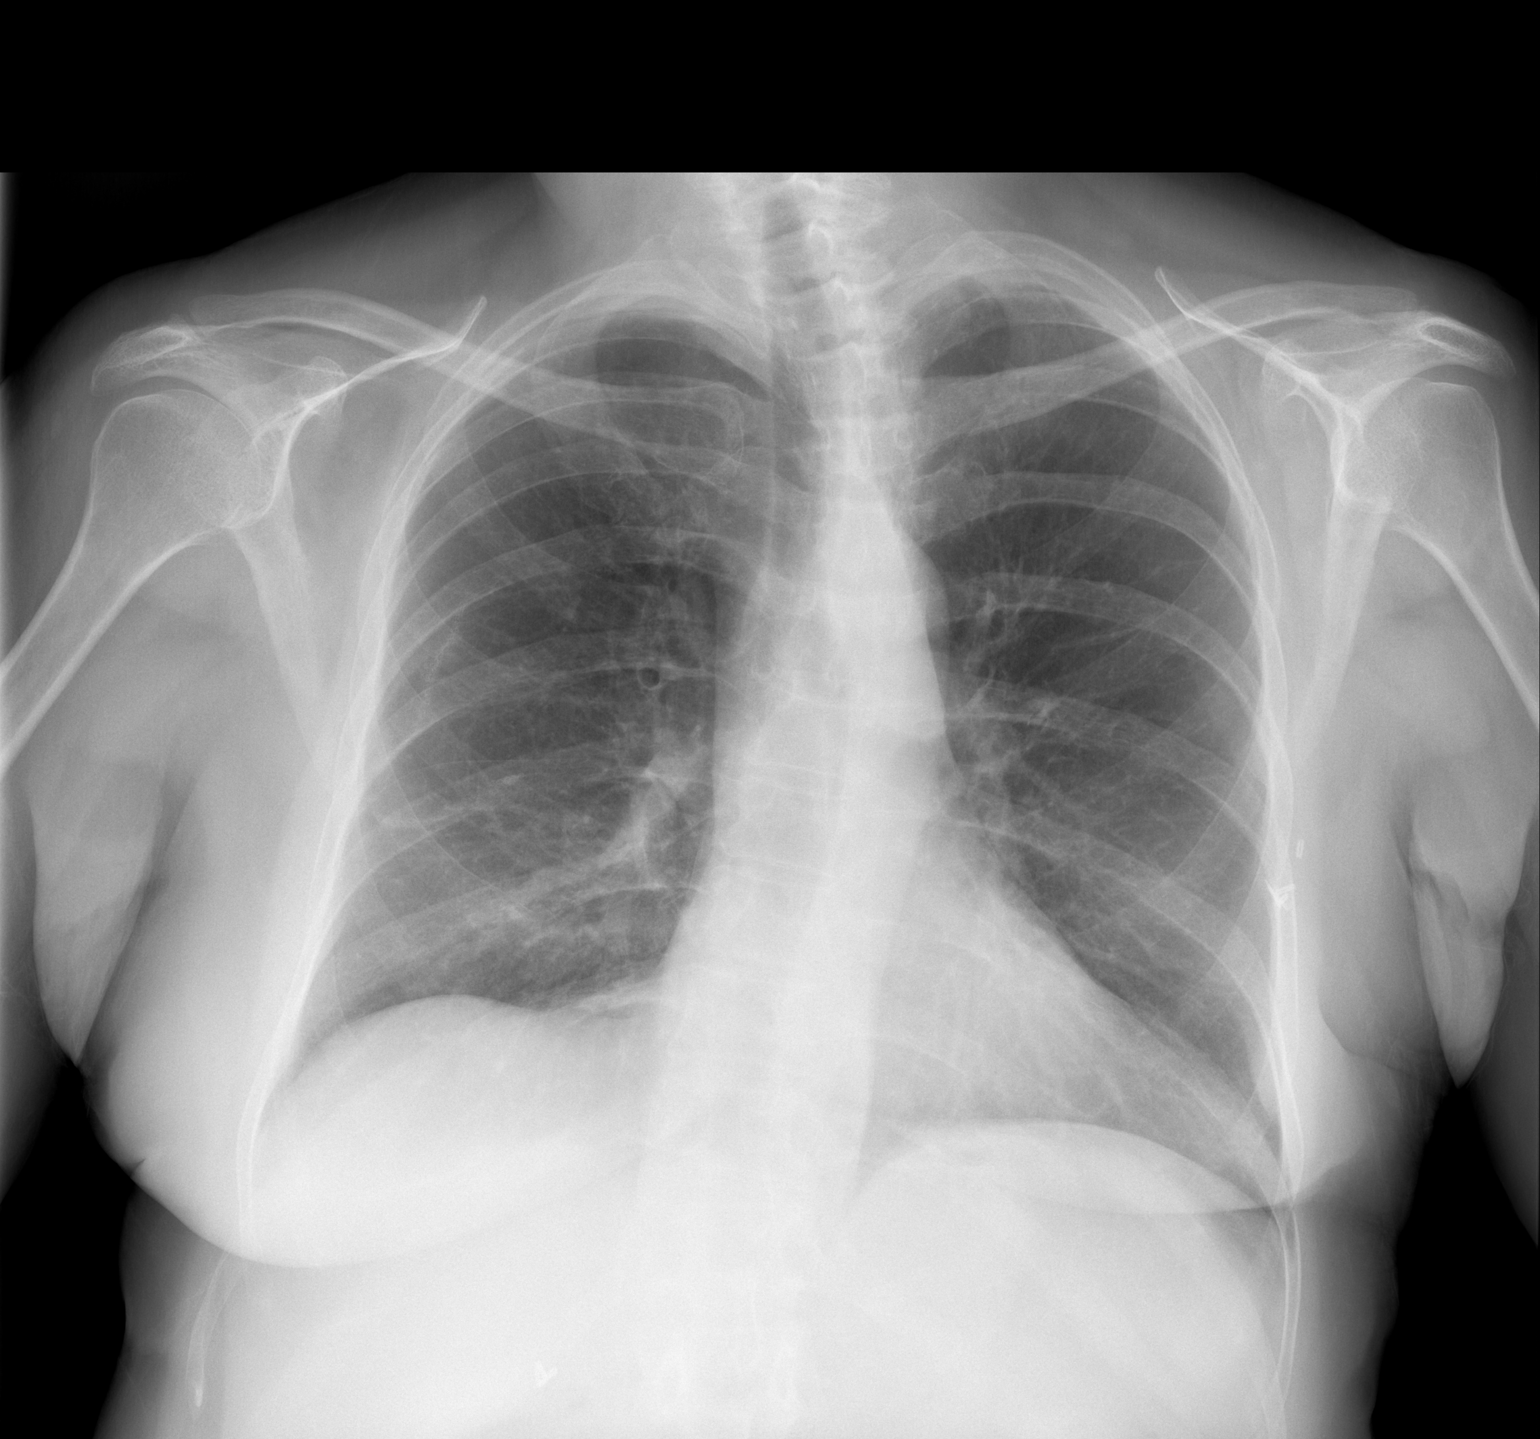

[w chest lat]
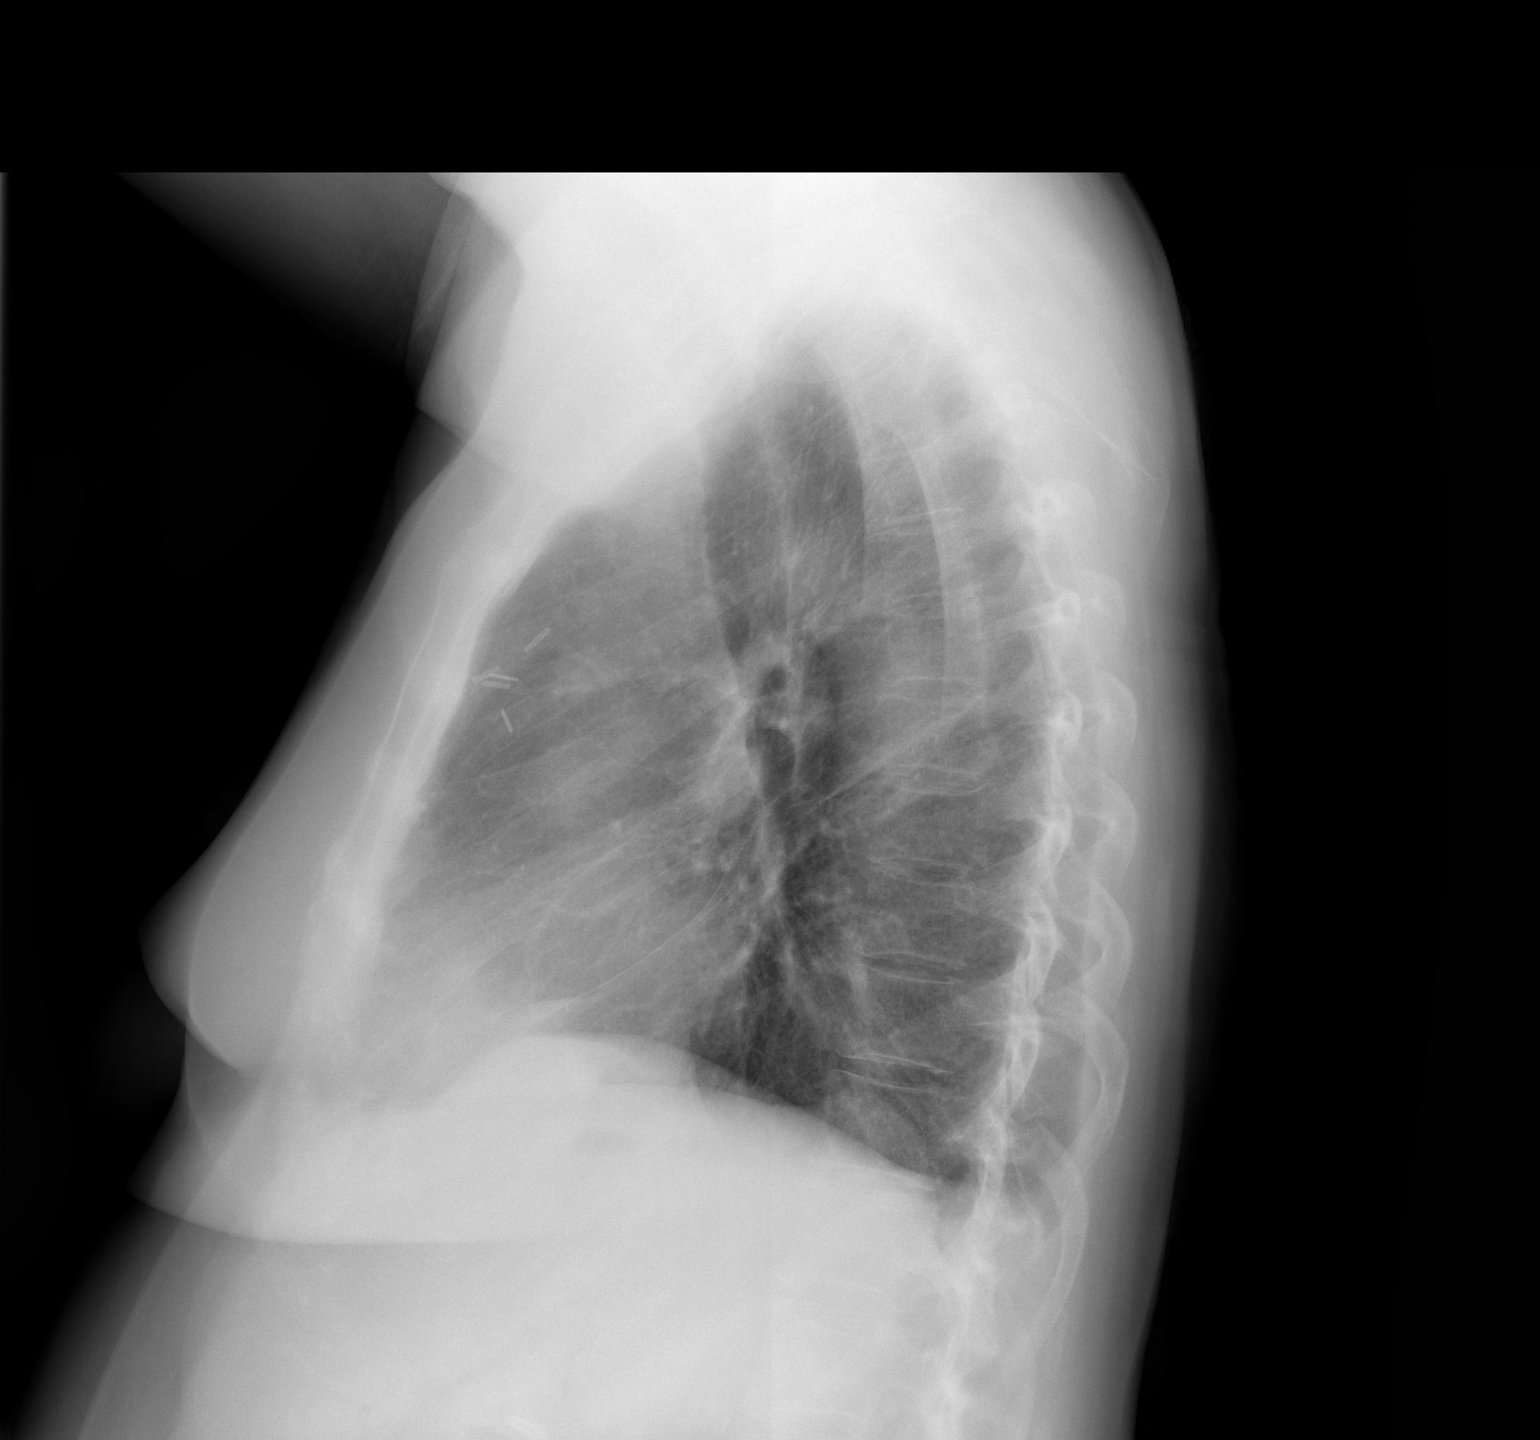

[2 of 2 positions shown; findings below may reference images not displayed]

FINDINGS: The heart size and mediastinal contours are within normal limits. No
pneumothorax or pleural effusion is noted. Left lung is clear.
Probable postsurgical changes or scarring or seen in right midlung.
No definite acute abnormality is noted. The visualized skeletal
structures are unremarkable.
IMPRESSION: No active cardiopulmonary disease.

## 2020-08-26 NOTE — Pre-Procedure Instructions (Signed)
[id number] [address], [place] [address] [place] [address] [place] [address] Phone: [phone] Fax: [phone]      Your procedure is scheduled on Thursday September 9th.  Report to [facility] Main Entrance "A" at 5:30 A.M., and check in at the Admitting office.  Call this number if you have problems the morning of surgery:  [phone]  Call [phone] if you have any questions prior to your surgery date Monday-Friday 8am-4pm    Remember:  Do not eat or drink anything after midnight the night before your surgery   Take these medicines the morning of surgery with A SIP OF WATER   atorvastatin (LIPITOR) 20 MG tablet  pantoprazole (PROTONIX) 40 MG tablet    IF NEEDED:  acetaminophen (TYLENOL) 325 MG tablet as needed  Polyethyl Glycol-Propyl Glycol (SYSTANE OP as needed  If no instruction given regarding Plavix please contact your surgeon for direction.  As of today, STOP taking any Aspirin (unless otherwise instructed by your surgeon) Aleve, Naproxen, Ibuprofen, Motrin, Advil, Goody's, BC's, all herbal medications, fish oil, and all vitamins.                      Do not wear jewelry, make up, or nail polish            Do not wear lotions, powders, perfumes, or deodorant.            Do not shave 48 hours prior to surgery.              Do not bring valuables to the hospital.            [facility] is not responsible for any belongings or valuables.  Do NOT Smoke (Tobacco/Vaping) or drink Alcohol 24 hours prior to your procedure If you use a CPAP at night, you may bring all equipment for your overnight stay.   Contacts, glasses, dentures or bridgework may not be worn into surgery.      For patients admitted to the hospital, discharge time will be determined by your treatment team.   Patients discharged the day of surgery will not be allowed to drive home, and someone needs to stay with them for 24 hours.    Special instructions:   [place]-  Preparing For Surgery  Before surgery, you can play an important role. Because skin is not sterile, your skin needs to be as free of germs as possible. You can reduce the number of germs on your skin by washing with CHG (chlorahexidine gluconate) Soap before surgery.  CHG is an antiseptic cleaner which kills germs and bonds with the skin to continue killing germs even after washing.    Oral Hygiene is also important to reduce your risk of infection.  Remember - BRUSH YOUR TEETH THE MORNING OF SURGERY WITH YOUR REGULAR TOOTHPASTE  Please do not use if you have an allergy to CHG or antibacterial soaps. If your skin becomes reddened/irritated stop using the CHG.  Do not shave (including legs and underarms) for at least 48 hours prior to first CHG shower. It is OK to shave your face.  Please follow these instructions carefully.   1. Shower the NIGHT BEFORE SURGERY and the MORNING OF SURGERY with CHG Soap.   2. If you chose to wash your hair, wash your hair first as usual with your normal shampoo.  3. After you shampoo, rinse your hair and body thoroughly to remove the shampoo.  4. Use CHG  as you would any other liquid soap. You can apply CHG directly to the skin and wash gently with a scrungie or a clean washcloth.   5. Apply the CHG Soap to your body ONLY FROM THE NECK DOWN.  Do not use on open wounds or open sores. Avoid contact with your eyes, ears, mouth and genitals (private parts). Wash Face and genitals (private parts)  with your normal soap.   6. Wash thoroughly, paying special attention to the area where your surgery will be performed.  7. Thoroughly rinse your body with warm water from the neck down.  8. DO NOT shower/wash with your normal soap after using and rinsing off the CHG Soap.  9. Pat yourself dry with a CLEAN TOWEL.  10. Wear CLEAN PAJAMAS to bed the night before surgery  11. Place CLEAN SHEETS on your bed the night of your first shower and DO NOT SLEEP WITH  PETS.   Day of Surgery: Wear Clean/Comfortable clothing the morning of surgery Do not apply any deodorants/lotions.   Remember to brush your teeth WITH YOUR REGULAR TOOTHPASTE.   Please read over the following fact sheets that you were given.

## 2020-08-26 NOTE — Progress Notes (Signed)
     [address]       [place] [address]             [phone]       Yvonne Jordan comes in for follow-up appointment.  Her case was discussed with her pulmonologist and we are all in agreement that this is likely due to mild pneumonitis from her severe reflux disease.  She is agreeable to proceed with a robotic assisted paraesophageal hernia repair with Nissen fundoplication, and gastropexy.  She will need to hold her Plavix as of tomorrow and she is tentatively scheduled for September 01, 2020.  Yvonne Jordan

## 2020-08-30 ENCOUNTER — Other Ambulatory Visit: Payer: Self-pay

## 2020-08-30 ENCOUNTER — Encounter (HOSPITAL_COMMUNITY): Payer: Self-pay

## 2020-08-30 ENCOUNTER — Encounter (HOSPITAL_COMMUNITY)
Admission: RE | Admit: 2020-08-30 | Discharge: 2020-08-30 | Disposition: A | Discharge status: Home / Self Care | Payer: Medicare Other | Source: Ambulatory Visit | Attending: Thoracic Surgery (Cardiothoracic Vascular Surgery) | Admitting: Thoracic Surgery (Cardiothoracic Vascular Surgery)

## 2020-08-30 ENCOUNTER — Other Ambulatory Visit (HOSPITAL_COMMUNITY)
Admission: RE | Admit: 2020-08-30 | Discharge: 2020-08-30 | Disposition: A | Discharge status: Home / Self Care | Payer: Medicare Other | Source: Ambulatory Visit | Attending: Thoracic Surgery (Cardiothoracic Vascular Surgery) | Admitting: Thoracic Surgery (Cardiothoracic Vascular Surgery)

## 2020-08-30 DIAGNOSIS — Z01818 Encounter for other preprocedural examination: Secondary | ICD-10-CM | POA: Insufficient documentation

## 2020-08-30 DIAGNOSIS — N1831 Chronic kidney disease, stage 3a: Secondary | ICD-10-CM | POA: Diagnosis not present

## 2020-08-30 DIAGNOSIS — K449 Diaphragmatic hernia without obstruction or gangrene: Secondary | ICD-10-CM

## 2020-08-30 DIAGNOSIS — Z923 Personal history of irradiation: Secondary | ICD-10-CM | POA: Diagnosis not present

## 2020-08-30 DIAGNOSIS — J690 Pneumonitis due to inhalation of food and vomit: Secondary | ICD-10-CM | POA: Diagnosis not present

## 2020-08-30 DIAGNOSIS — K219 Gastro-esophageal reflux disease without esophagitis: Secondary | ICD-10-CM | POA: Diagnosis not present

## 2020-08-30 DIAGNOSIS — Z8673 Personal history of transient ischemic attack (TIA), and cerebral infarction without residual deficits: Secondary | ICD-10-CM | POA: Diagnosis not present

## 2020-08-30 DIAGNOSIS — J849 Interstitial pulmonary disease, unspecified: Secondary | ICD-10-CM | POA: Diagnosis not present

## 2020-08-30 DIAGNOSIS — R1310 Dysphagia, unspecified: Secondary | ICD-10-CM | POA: Diagnosis not present

## 2020-08-30 DIAGNOSIS — Z01812 Encounter for preprocedural laboratory examination: Secondary | ICD-10-CM | POA: Insufficient documentation

## 2020-08-30 DIAGNOSIS — M1990 Unspecified osteoarthritis, unspecified site: Secondary | ICD-10-CM | POA: Diagnosis not present

## 2020-08-30 DIAGNOSIS — Z23 Encounter for immunization: Secondary | ICD-10-CM | POA: Diagnosis not present

## 2020-08-30 DIAGNOSIS — Z853 Personal history of malignant neoplasm of breast: Secondary | ICD-10-CM | POA: Diagnosis not present

## 2020-08-30 DIAGNOSIS — Z20822 Contact with and (suspected) exposure to covid-19: Secondary | ICD-10-CM | POA: Insufficient documentation

## 2020-08-30 HISTORY — DX: Gastro-esophageal reflux disease without esophagitis: K21.9

## 2020-08-30 HISTORY — DX: Dyspnea, unspecified: R06.00

## 2020-08-30 HISTORY — DX: Unspecified osteoarthritis, unspecified site: M19.90

## 2020-08-30 HISTORY — DX: Cerebral infarction, unspecified: I63.9

## 2020-08-30 HISTORY — DX: Personal history of other diseases of the digestive system: Z87.19

## 2020-08-30 LAB — COMPREHENSIVE METABOLIC PANEL
ALT: 18 U/L (ref 0–44)
AST: 35 U/L (ref 15–41)
Albumin: 4.2 g/dL (ref 3.5–5.0)
Alkaline Phosphatase: 57 U/L (ref 38–126)
Anion gap: 11 (ref 5–15)
BUN: 10 mg/dL (ref 8–23)
CO2: 16 mmol/L — ABNORMAL LOW (ref 22–32)
Calcium: 8.9 mg/dL (ref 8.9–10.3)
Chloride: 110 mmol/L (ref 98–111)
Creatinine, Ser: 1 mg/dL (ref 0.44–1.00)
GFR calc Af Amer: >60 mL/min (ref >60)
GFR calc non Af Amer: 55 mL/min — ABNORMAL LOW (ref >60)
Glucose, Bld: 92 mg/dL (ref 70–99)
Potassium: 4.8 mmol/L (ref 3.5–5.1)
Sodium: 137 mmol/L (ref 135–145)
Total Bilirubin: 0.9 mg/dL (ref 0.3–1.2)
Total Protein: 6.9 g/dL (ref 6.5–8.1)

## 2020-08-30 LAB — TYPE AND SCREEN
ABO/RH(D): A POS
Antibody Screen: NEGATIVE

## 2020-08-30 LAB — CBC
HCT: 42.1 % (ref 36.0–46.0)
Hemoglobin: 13.3 g/dL (ref 12.0–15.0)
MCH: 26 pg (ref 26.0–34.0)
MCHC: 31.6 g/dL (ref 30.0–36.0)
MCV: 82.4 fL (ref 80.0–100.0)
Platelets: 259 10*3/uL (ref 150–400)
RBC: 5.11 MIL/uL (ref 3.87–5.11)
RDW: 15.9 % — ABNORMAL HIGH (ref 11.5–15.5)
WBC: 8.6 10*3/uL (ref 4.0–10.5)
nRBC: 0 % (ref 0.0–0.2)

## 2020-08-30 LAB — URINALYSIS, ROUTINE W REFLEX MICROSCOPIC
Bilirubin Urine: NEGATIVE
Glucose, UA: NEGATIVE mg/dL
Hgb urine dipstick: NEGATIVE
Ketones, ur: NEGATIVE mg/dL
Nitrite: NEGATIVE
Protein, ur: 30 mg/dL — AB
Specific Gravity, Urine: 1.012 (ref 1.005–1.030)
pH: 5 (ref 5.0–8.0)

## 2020-08-30 LAB — PROTIME-INR
INR: 1 (ref 0.8–1.2)
Prothrombin Time: 12.4 seconds (ref 11.4–15.2)

## 2020-08-30 LAB — SURGICAL PCR SCREEN
MRSA, PCR: NEGATIVE
Staphylococcus aureus: NEGATIVE

## 2020-08-30 LAB — SARS CORONAVIRUS 2 (TAT 6-24 HRS): SARS Coronavirus 2: NEGATIVE

## 2020-08-30 LAB — APTT: aPTT: 29 seconds (ref 24–36)

## 2020-08-30 IMAGING — CR DG CHEST 2V
2 series · 2 of 2 positions shown · non-contrast
Comparison: [DATE]

CLINICAL DATA: Preoperative evaluation for hiatal hernia surgery.
History of breast carcinoma

EXAM:
CHEST - 2 VIEW

[w chest pa]
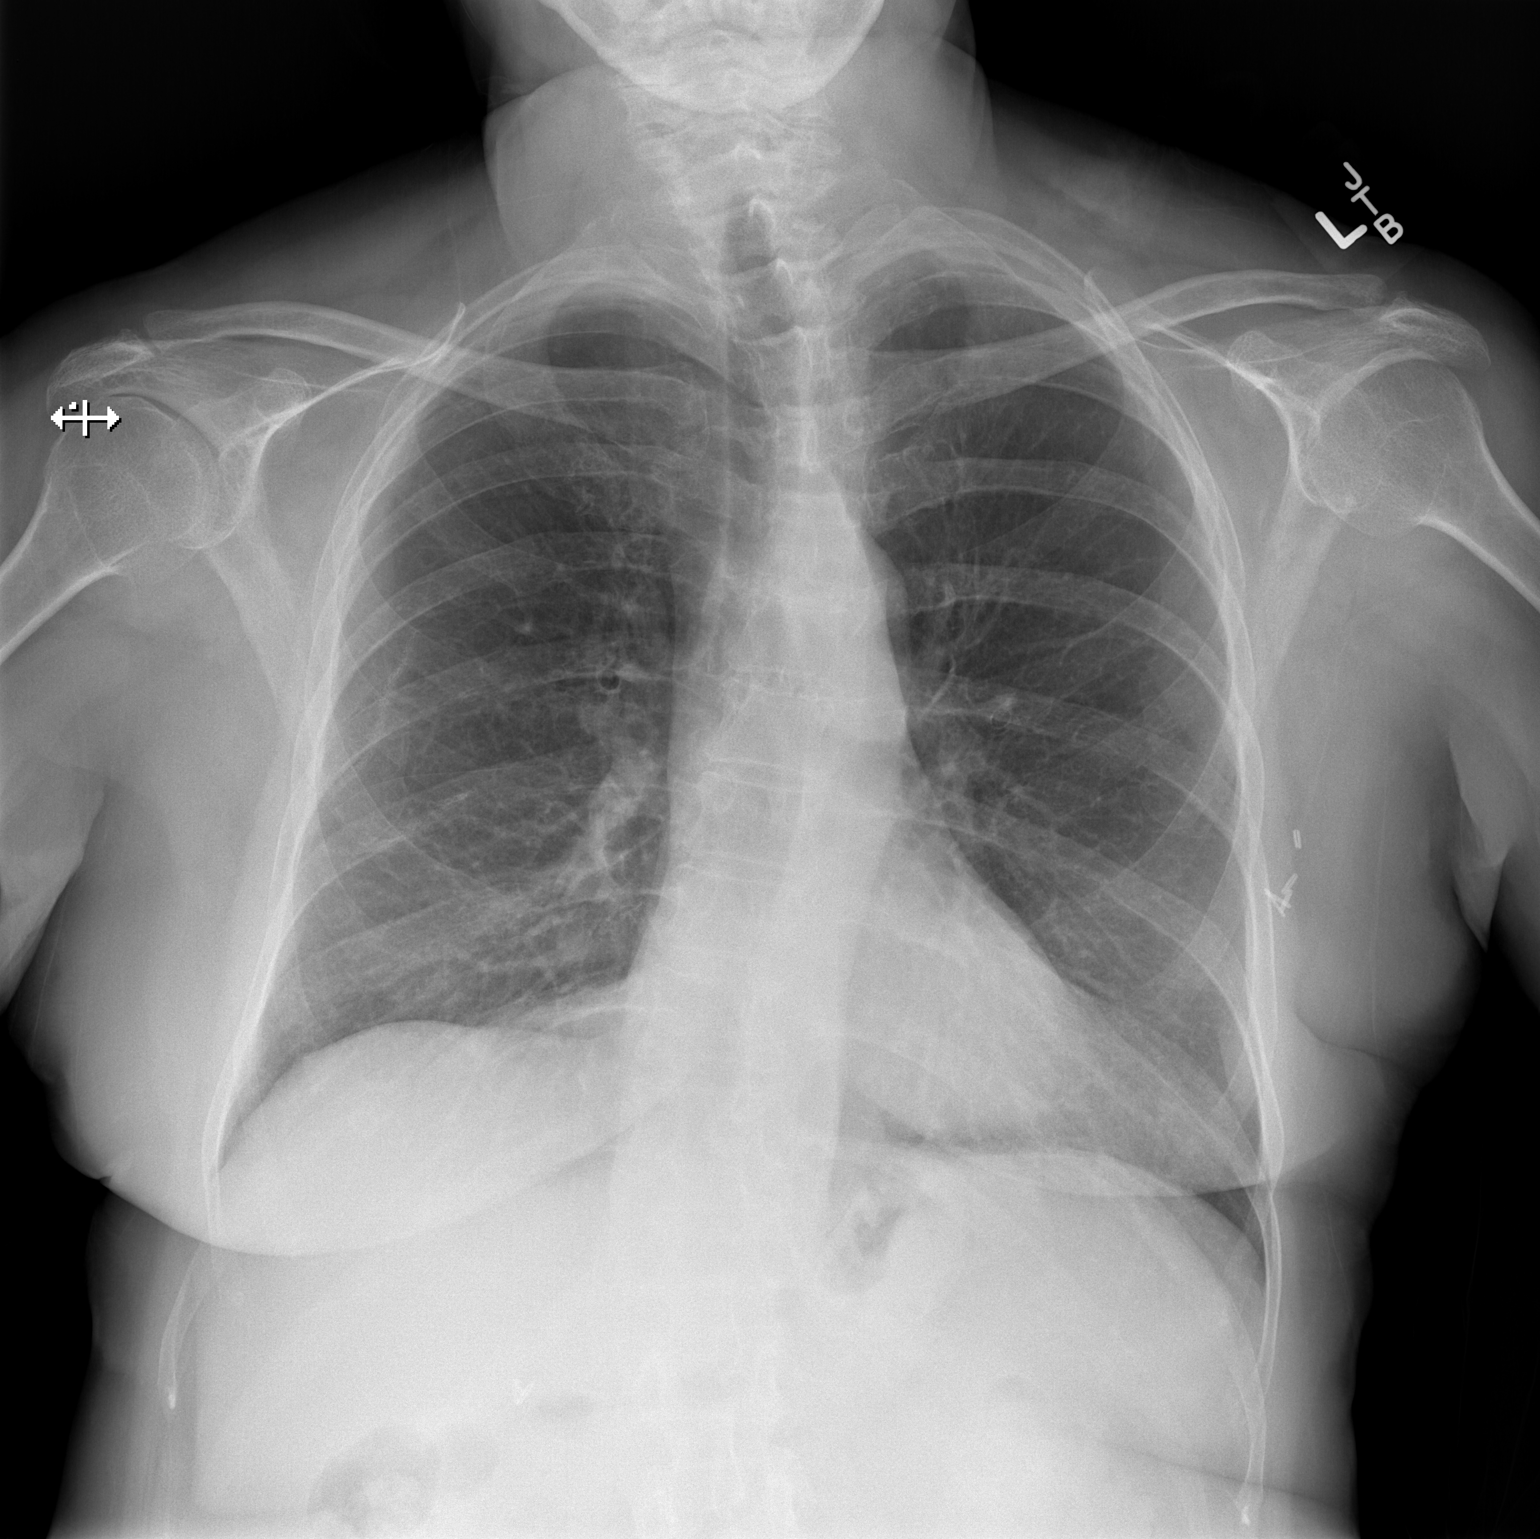

[w chest lat]
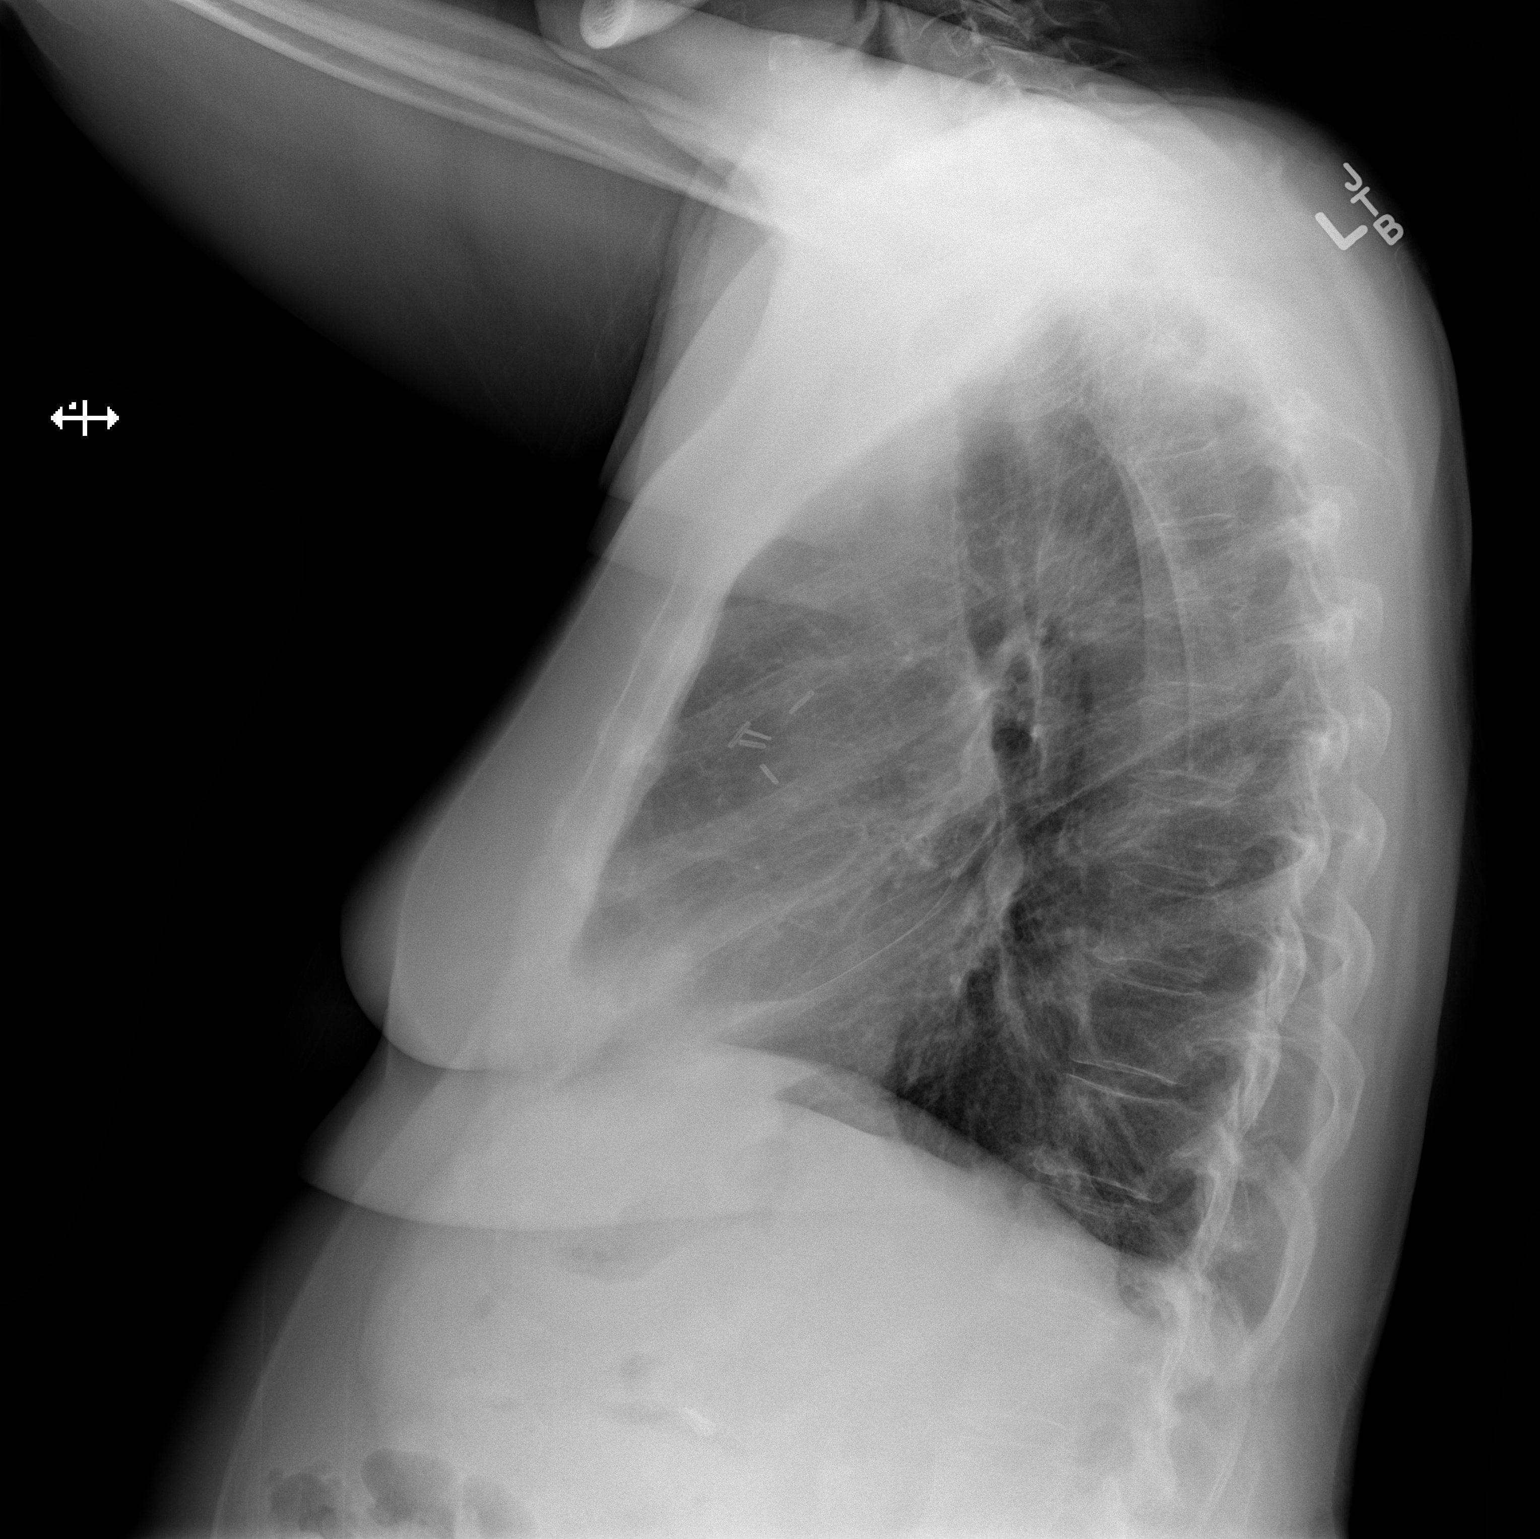

[2 of 2 positions shown; findings below may reference images not displayed]

FINDINGS: There is postoperative change in the right lung region. There is no
edema or airspace opacity. The heart size and pulmonary vascularity
are normal. No adenopathy. Postoperative change noted in the left
breast region with surgical clips on the left laterally.
IMPRESSION: No edema or airspace opacity. Postoperative change right lung. Heart
size within normal limits. Postoperative change left breast region.

## 2020-08-30 NOTE — Progress Notes (Signed)
PCP - Dr. Dory Larsen  Cardiologist - Dr.Bridgette Christopher   Chest x-ray - 08/30/20  EKG - 08/30/20  Stress Test - 2014  ECHO -   Cardiac Cath -   PFTs  07/05/20  Sleep Study - no  CPAP - no  LABS-CBC, CMP, PT, PTT, T/S, UA. ABG the day of surgery.  ASA-  ERAS-no  HA1C- Fasting Blood Sugar - na Checks Blood Sugar ___na__ times a day  Anesthesia-  Pt denies having chest pain, sob, or fever at this time. All instructions explained to the pt, with a verbal understanding of the material. Pt agrees to go over the instructions while at home for a better understanding. Pt also instructed to self quarantine after being tested for COVID-19. The opportunity to ask questions was provided.

## 2020-08-30 NOTE — Pre-Procedure Instructions (Signed)
Your procedure is scheduled on Thursday September 9th.  Report to [facility] Main Entrance "A" at 5:30 A.M., and check in at the Admitting office.             Your surgery or procedure is scheduled to begin at 7:30 AM    Call this number if you have problems the morning of surgery:  [phone]  Call [phone] if you have any questions prior to your surgery date Monday-Friday 8am-4pm    Remember:  Do not eat or drink anything after midnight the night before your surgery   Take these medicines the morning of surgery with A SIP OF WATER   atorvastatin (LIPITOR) 20 MG tablet  pantoprazole (PROTONIX) 40 MG tablet    IF NEEDED:  acetaminophen (TYLENOL) 325 MG tablet as needed  Polyethyl Glycol-Propyl Glycol (SYSTANE OP as needed  If no instruction given regarding Plavix please contact your surgeon for direction.  As of today, STOP taking any Aspirin (unless otherwise instructed by your surgeon) Aleve, Naproxen, Ibuprofen, Motrin, Advil, Goody's, BC's, all herbal medications, fish oil, and all vitamins.   Special instructions:   [place]- Preparing For Surgery  Before surgery, you can play an important role. Because skin is not sterile, your skin needs to be as free of germs as possible. You can reduce the number of germs on your skin by washing with CHG (chlorahexidine gluconate) Soap before surgery.  CHG is an antiseptic cleaner which kills germs and bonds with the skin to continue killing germs even after washing.    Oral Hygiene is also important to reduce your risk of infection.  Remember - BRUSH YOUR TEETH THE MORNING OF SURGERY WITH YOUR REGULAR TOOTHPASTE  Please do not use if you have an allergy to CHG or antibacterial soaps. If your skin becomes reddened/irritated stop using the CHG.  Do not shave (including legs and underarms) for at least 48 hours prior to first CHG shower. It is OK to shave your face.  Please follow these instructions carefully.   1. Shower  the NIGHT BEFORE SURGERY and the MORNING OF SURGERY with CHG Soap.   2. If you chose to wash your hair, wash your hair first as usual with your normal shampoo.  3. After you shampoo,wash your face and private area with the soap you use at home, then rinse your hair and body thoroughly to remove the shampoo and soap.  4. Use CHG as you would any other liquid soap. You can apply CHG directly to the skin and wash gently with a scrungie or a clean washcloth.   5. Apply the CHG Soap to your body ONLY FROM THE NECK DOWN.  Do not use on open wounds or open sores. Avoid contact with your eyes, ears, mouth and genitals (private parts).   6. Wash thoroughly, paying special attention to the area where your surgery will be performed.  7. Thoroughly rinse your body with warm water from the neck down.  8. DO NOT shower/wash with your normal soap after using and rinsing off the CHG Soap.  9. Pat yourself dry with a CLEAN TOWEL.  10. Wear CLEAN PAJAMAS to bed the night before surgery  11. Place CLEAN SHEETS on your bed the night of your first shower and DO NOT SLEEP WITH PETS.   Day of Surgery: Shower as instructed above. Wear Clean/Comfortable clothing the morning of surgery Do not wear lotions, powders, perfumes, or deodorant. Remember to brush your teeth WITH YOUR REGULAR  TOOTHPASTE.             Do not wear jewelry, make up, or nail polish             Do not shave 48 hours prior to surgery.               Do not bring valuables to the hospital.             [facility] is not responsible for any belongings or valuables.  If you use a CPAP at night, you may bring all equipment for your overnight stay.   Contacts, glasses, dentures or bridgework may not be worn into surgery.      For patients admitted to the hospital, discharge time will be determined by your treatment team.   Patients discharged the day of surgery will not be allowed to drive home, and someone needs to stay with them for 24  hours.  Please read over the fact sheets that you were given.

## 2020-09-01 ENCOUNTER — Inpatient Hospital Stay (HOSPITAL_COMMUNITY): Payer: Medicare Other | Admitting: Anesthesiology

## 2020-09-01 ENCOUNTER — Encounter (HOSPITAL_COMMUNITY): Payer: Self-pay | Admitting: Thoracic Surgery (Cardiothoracic Vascular Surgery)

## 2020-09-01 ENCOUNTER — Inpatient Hospital Stay (HOSPITAL_COMMUNITY)
Admission: RE | Admit: 2020-09-01 | Discharge: 2020-09-03 | DRG: 326 | Disposition: A | Discharge status: Home / Self Care | Payer: Medicare Other | Attending: Thoracic Surgery (Cardiothoracic Vascular Surgery) | Admitting: Thoracic Surgery (Cardiothoracic Vascular Surgery)

## 2020-09-01 ENCOUNTER — Encounter (HOSPITAL_COMMUNITY)
Admission: RE | Disposition: A | Discharge status: Home / Self Care | Payer: Self-pay | Source: Home / Self Care | Attending: Thoracic Surgery (Cardiothoracic Vascular Surgery)

## 2020-09-01 ENCOUNTER — Other Ambulatory Visit: Payer: Self-pay

## 2020-09-01 ENCOUNTER — Inpatient Hospital Stay (HOSPITAL_COMMUNITY): Payer: Medicare Other

## 2020-09-01 ENCOUNTER — Inpatient Hospital Stay (HOSPITAL_COMMUNITY): Payer: Medicare Other | Admitting: Vascular Surgery

## 2020-09-01 DIAGNOSIS — N189 Chronic kidney disease, unspecified: Secondary | ICD-10-CM | POA: Diagnosis not present

## 2020-09-01 DIAGNOSIS — M1990 Unspecified osteoarthritis, unspecified site: Secondary | ICD-10-CM | POA: Diagnosis present

## 2020-09-01 DIAGNOSIS — Z86010 Personal history of colonic polyps: Secondary | ICD-10-CM

## 2020-09-01 DIAGNOSIS — E785 Hyperlipidemia, unspecified: Secondary | ICD-10-CM | POA: Diagnosis present

## 2020-09-01 DIAGNOSIS — Z20822 Contact with and (suspected) exposure to covid-19: Secondary | ICD-10-CM | POA: Diagnosis present

## 2020-09-01 DIAGNOSIS — Z803 Family history of malignant neoplasm of breast: Secondary | ICD-10-CM | POA: Diagnosis not present

## 2020-09-01 DIAGNOSIS — Z23 Encounter for immunization: Secondary | ICD-10-CM | POA: Diagnosis not present

## 2020-09-01 DIAGNOSIS — E559 Vitamin D deficiency, unspecified: Secondary | ICD-10-CM | POA: Diagnosis present

## 2020-09-01 DIAGNOSIS — Z8673 Personal history of transient ischemic attack (TIA), and cerebral infarction without residual deficits: Secondary | ICD-10-CM | POA: Diagnosis not present

## 2020-09-01 DIAGNOSIS — Z9889 Other specified postprocedural states: Secondary | ICD-10-CM

## 2020-09-01 DIAGNOSIS — Z853 Personal history of malignant neoplasm of breast: Secondary | ICD-10-CM

## 2020-09-01 DIAGNOSIS — R1310 Dysphagia, unspecified: Secondary | ICD-10-CM | POA: Diagnosis present

## 2020-09-01 DIAGNOSIS — K219 Gastro-esophageal reflux disease without esophagitis: Secondary | ICD-10-CM | POA: Diagnosis present

## 2020-09-01 DIAGNOSIS — Z923 Personal history of irradiation: Secondary | ICD-10-CM

## 2020-09-01 DIAGNOSIS — J849 Interstitial pulmonary disease, unspecified: Secondary | ICD-10-CM | POA: Diagnosis present

## 2020-09-01 DIAGNOSIS — K449 Diaphragmatic hernia without obstruction or gangrene: Secondary | ICD-10-CM | POA: Diagnosis not present

## 2020-09-01 DIAGNOSIS — N1831 Chronic kidney disease, stage 3a: Secondary | ICD-10-CM | POA: Diagnosis present

## 2020-09-01 DIAGNOSIS — J690 Pneumonitis due to inhalation of food and vomit: Secondary | ICD-10-CM | POA: Diagnosis present

## 2020-09-01 DIAGNOSIS — J90 Pleural effusion, not elsewhere classified: Secondary | ICD-10-CM | POA: Diagnosis not present

## 2020-09-01 HISTORY — PX: XI ROBOTIC ASSISTED PARAESOPHAGEAL HERNIA REPAIR: SHX6871

## 2020-09-01 HISTORY — PX: ESOPHAGOGASTRODUODENOSCOPY: SHX5428

## 2020-09-01 LAB — CBC
HCT: 34.1 % — ABNORMAL LOW (ref 36.0–46.0)
Hemoglobin: 10.9 g/dL — ABNORMAL LOW (ref 12.0–15.0)
MCH: 25.9 pg — ABNORMAL LOW (ref 26.0–34.0)
MCHC: 32 g/dL (ref 30.0–36.0)
MCV: 81 fL (ref 80.0–100.0)
Platelets: 221 10*3/uL (ref 150–400)
RBC: 4.21 MIL/uL (ref 3.87–5.11)
RDW: 15.7 % — ABNORMAL HIGH (ref 11.5–15.5)
WBC: 12.7 10*3/uL — ABNORMAL HIGH (ref 4.0–10.5)
nRBC: 0 % (ref 0.0–0.2)

## 2020-09-01 LAB — BASIC METABOLIC PANEL
Anion gap: 10 (ref 5–15)
BUN: 11 mg/dL (ref 8–23)
CO2: 19 mmol/L — ABNORMAL LOW (ref 22–32)
Calcium: 8.4 mg/dL — ABNORMAL LOW (ref 8.9–10.3)
Chloride: 110 mmol/L (ref 98–111)
Creatinine, Ser: 1.41 mg/dL — ABNORMAL HIGH (ref 0.44–1.00)
GFR calc Af Amer: 42 mL/min — ABNORMAL LOW (ref >60)
GFR calc non Af Amer: 37 mL/min — ABNORMAL LOW (ref >60)
Glucose, Bld: 129 mg/dL — ABNORMAL HIGH (ref 70–99)
Potassium: 4.7 mmol/L (ref 3.5–5.1)
Sodium: 139 mmol/L (ref 135–145)

## 2020-09-01 LAB — BLOOD GAS, ARTERIAL
Acid-base deficit: 4 mmol/L — ABNORMAL HIGH (ref 0.0–2.0)
Bicarbonate: 20.2 mmol/L (ref 20.0–28.0)
Drawn by: 45071
FIO2: 21
O2 Saturation: 95.1 %
Patient temperature: 37
pCO2 arterial: 35 mmHg (ref 32.0–48.0)
pH, Arterial: 7.379 (ref 7.350–7.450)
pO2, Arterial: 91.3 mmHg (ref 83.0–108.0)

## 2020-09-01 LAB — GLUCOSE, CAPILLARY
Glucose-Capillary: 102 mg/dL — ABNORMAL HIGH (ref 70–99)
Glucose-Capillary: 132 mg/dL — ABNORMAL HIGH (ref 70–99)
Glucose-Capillary: 97 mg/dL (ref 70–99)

## 2020-09-01 IMAGING — CR DG CHEST 1V PORT
1 series · 1 of 1 positions shown · non-contrast
Comparison: [DATE]

CLINICAL DATA: Status post paraesophageal hernia repair

EXAM:
PORTABLE CHEST 1 VIEW

[AP]
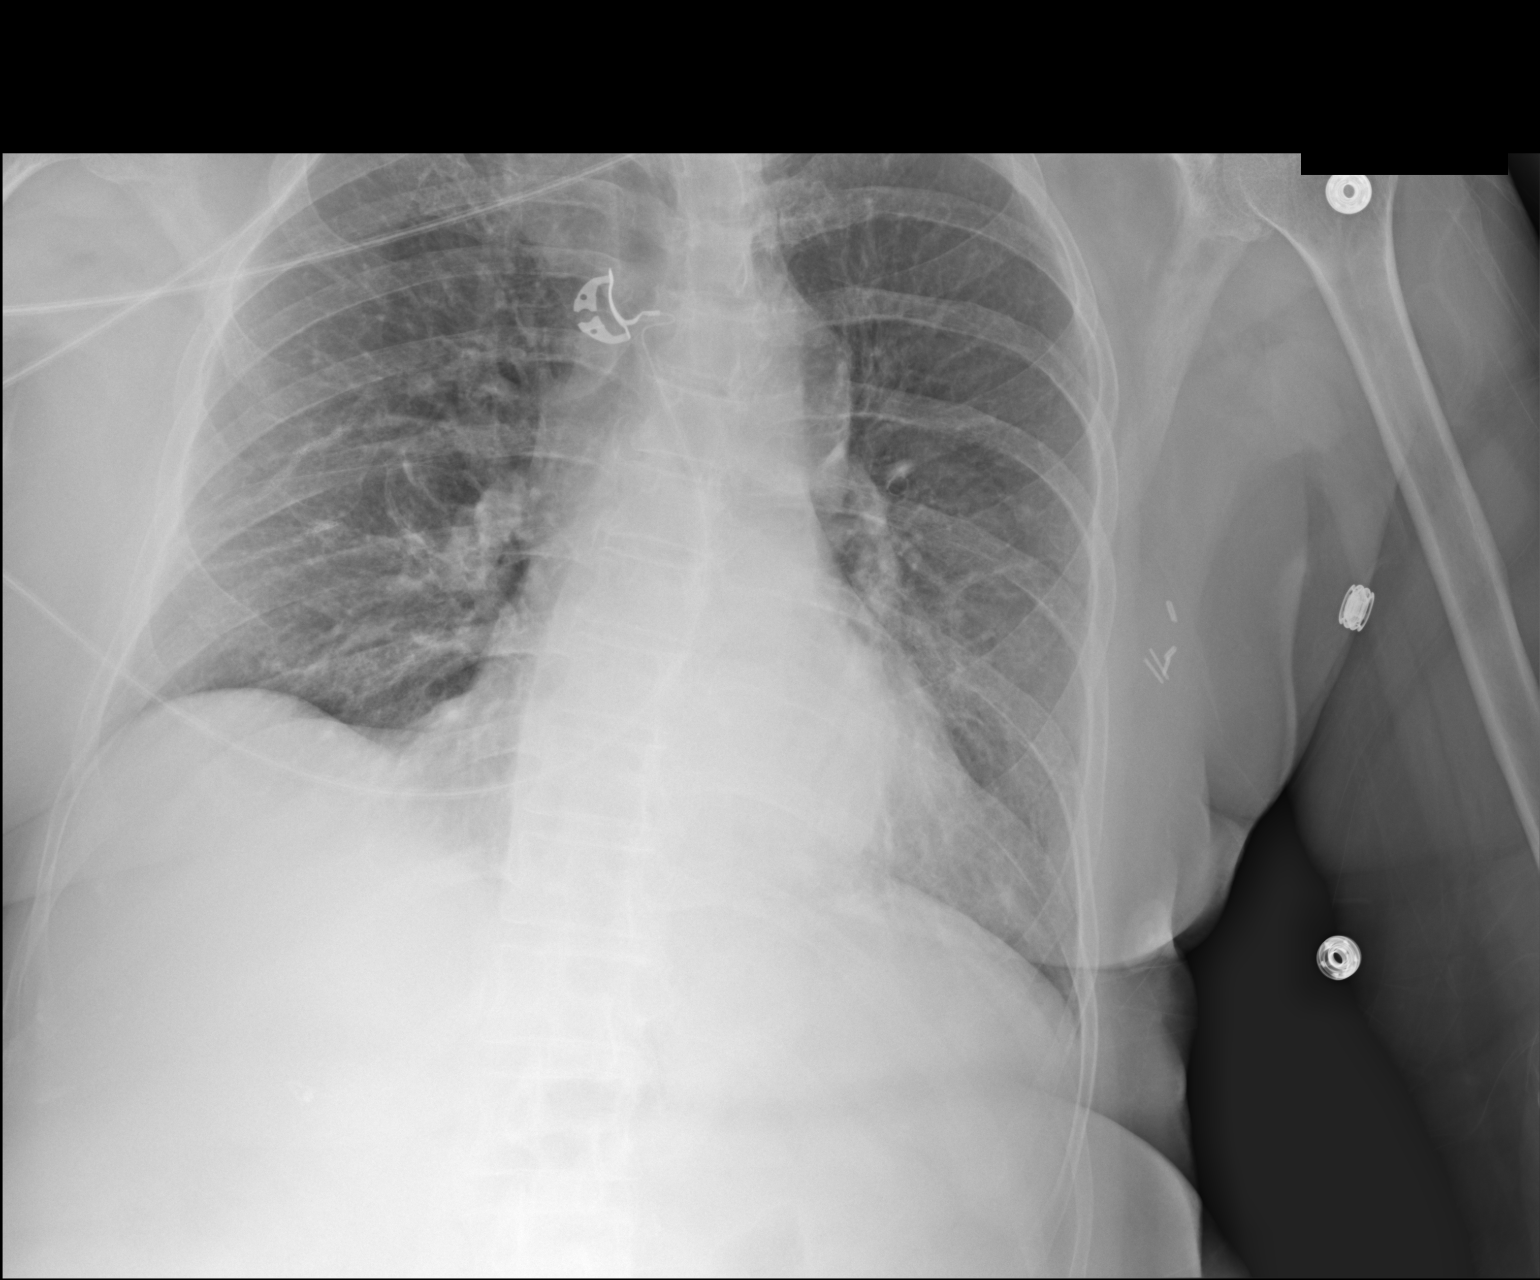

[1 of 1 positions shown; findings below may reference images not displayed]

FINDINGS: Cardiac shadow is within normal limits. The lungs are well aerated
bilaterally. No focal infiltrate, effusion or pneumothorax is seen.
No bony abnormality is noted.
IMPRESSION: No acute abnormality seen.

## 2020-09-01 SURGERY — REPAIR, HERNIA, PARAESOPHAGEAL, ROBOT-ASSISTED
Anesthesia: General | Site: Chest

## 2020-09-01 MED ORDER — KETOROLAC TROMETHAMINE 30 MG/ML IJ SOLN
INTRAMUSCULAR | Status: AC
  Filled 2020-09-01: qty 1

## 2020-09-01 MED ORDER — CHLORHEXIDINE GLUCONATE 0.12 % MT SOLN: 15.0000 mL | Freq: Once | OROMUCOSAL | Status: AC

## 2020-09-01 MED ORDER — ONDANSETRON HCL 4 MG/2ML IJ SOLN
INTRAMUSCULAR | Status: AC
  Filled 2020-09-01: qty 2

## 2020-09-01 MED ORDER — LIDOCAINE 2% (20 MG/ML) 5 ML SYRINGE
INTRAMUSCULAR | Status: AC
  Filled 2020-09-01: qty 5

## 2020-09-01 MED ORDER — CEFAZOLIN SODIUM-DEXTROSE 2-4 GM/100ML-% IV SOLN
2.0000 g | INTRAVENOUS | Status: AC
  Administered 2020-09-01: 2 g via INTRAVENOUS
  Filled 2020-09-01: qty 100

## 2020-09-01 MED ORDER — BUPIVACAINE LIPOSOME 1.3 % IJ SUSP
INTRAMUSCULAR | Status: DC | PRN
  Administered 2020-09-01: 50 mL

## 2020-09-01 MED ORDER — ROCURONIUM BROMIDE 10 MG/ML (PF) SYRINGE
PREFILLED_SYRINGE | INTRAVENOUS | Status: AC
  Filled 2020-09-01: qty 10

## 2020-09-01 MED ORDER — CHLORHEXIDINE GLUCONATE 0.12 % MT SOLN
15.0000 mL | OROMUCOSAL | Status: AC
  Administered 2020-09-01: 15 mL via OROMUCOSAL
  Filled 2020-09-01: qty 15

## 2020-09-01 MED ORDER — MIDAZOLAM HCL 2 MG/2ML IJ SOLN
INTRAMUSCULAR | Status: AC
  Filled 2020-09-01: qty 2

## 2020-09-01 MED ORDER — FENTANYL CITRATE (PF) 250 MCG/5ML IJ SOLN
INTRAMUSCULAR | Status: AC
  Filled 2020-09-01: qty 5

## 2020-09-01 MED ORDER — PROPOFOL 10 MG/ML IV BOLUS
INTRAVENOUS | Status: AC
  Filled 2020-09-01: qty 20

## 2020-09-01 MED ORDER — PHENYLEPHRINE HCL-NACL 10-0.9 MG/250ML-% IV SOLN
INTRAVENOUS | Status: DC | PRN
  Administered 2020-09-01: 20 ug/min via INTRAVENOUS

## 2020-09-01 MED ORDER — KETOROLAC TROMETHAMINE 15 MG/ML IJ SOLN
15.0000 mg | Freq: Four times a day (QID) | INTRAMUSCULAR | Status: DC
  Administered 2020-09-01 – 2020-09-02 (×3): 15 mg via INTRAVENOUS
  Filled 2020-09-01 (×3): qty 1

## 2020-09-01 MED ORDER — FENTANYL CITRATE (PF) 100 MCG/2ML IJ SOLN
INTRAMUSCULAR | Status: DC | PRN
Start: 2020-09-01 — End: 2020-09-01
  Administered 2020-09-01 (×5): 50 ug via INTRAVENOUS
  Administered 2020-09-01: 100 ug via INTRAVENOUS

## 2020-09-01 MED ORDER — PNEUMOCOCCAL VAC POLYVALENT 25 MCG/0.5ML IJ INJ
0.5000 mL | INJECTION | INTRAMUSCULAR | Status: AC
  Administered 2020-09-03: 0.5 mL via INTRAMUSCULAR
  Filled 2020-09-01: qty 0.5

## 2020-09-01 MED ORDER — PANTOPRAZOLE SODIUM 40 MG IV SOLR
40.0000 mg | Freq: Two times a day (BID) | INTRAVENOUS | Status: DC
  Administered 2020-09-01 (×2): 40 mg via INTRAVENOUS
  Filled 2020-09-01 (×2): qty 40

## 2020-09-01 MED ORDER — FENTANYL CITRATE (PF) 100 MCG/2ML IJ SOLN
25.0000 ug | INTRAMUSCULAR | Status: DC | PRN
  Administered 2020-09-01: 50 ug via INTRAVENOUS

## 2020-09-01 MED ORDER — LIDOCAINE 2% (20 MG/ML) 5 ML SYRINGE
INTRAMUSCULAR | Status: DC | PRN
  Administered 2020-09-01: 80 mg via INTRAVENOUS

## 2020-09-01 MED ORDER — ROCURONIUM BROMIDE 100 MG/10ML IV SOLN
INTRAVENOUS | Status: DC | PRN
  Administered 2020-09-01 (×3): 50 mg via INTRAVENOUS

## 2020-09-01 MED ORDER — SODIUM CHLORIDE 0.9 % IV SOLN
2.0000 g | Freq: Four times a day (QID) | INTRAVENOUS | Status: AC
  Administered 2020-09-01 – 2020-09-02 (×3): 2 g via INTRAVENOUS
  Filled 2020-09-01 (×3): qty 2

## 2020-09-01 MED ORDER — EPINEPHRINE PF 1 MG/ML IJ SOLN
INTRAMUSCULAR | Status: AC
  Filled 2020-09-01: qty 2

## 2020-09-01 MED ORDER — FENTANYL CITRATE (PF) 100 MCG/2ML IJ SOLN
INTRAMUSCULAR | Status: AC
  Administered 2020-09-01: 25 ug via INTRAVENOUS
  Filled 2020-09-01: qty 2

## 2020-09-01 MED ORDER — ALBUTEROL SULFATE (2.5 MG/3ML) 0.083% IN NEBU: 2.5000 mg | INHALATION_SOLUTION | RESPIRATORY_TRACT | Status: DC | PRN

## 2020-09-01 MED ORDER — SUCCINYLCHOLINE CHLORIDE 20 MG/ML IJ SOLN
INTRAMUSCULAR | Status: DC | PRN
  Administered 2020-09-01: 160 mg via INTRAVENOUS

## 2020-09-01 MED ORDER — LACTATED RINGERS IV SOLN: INTRAVENOUS | Status: DC | PRN

## 2020-09-01 MED ORDER — MIDAZOLAM HCL 5 MG/5ML IJ SOLN
INTRAMUSCULAR | Status: DC | PRN
  Administered 2020-09-01: 1 mg via INTRAVENOUS

## 2020-09-01 MED ORDER — KETOROLAC TROMETHAMINE 30 MG/ML IJ SOLN
INTRAMUSCULAR | Status: DC | PRN
  Administered 2020-09-01: 30 mg via INTRAVENOUS

## 2020-09-01 MED ORDER — HYDROCODONE-ACETAMINOPHEN 7.5-325 MG/15ML PO SOLN
10.0000 mL | ORAL | Status: DC | PRN
  Administered 2020-09-02 – 2020-09-03 (×4): 10 mL via ORAL
  Filled 2020-09-01 (×4): qty 15

## 2020-09-01 MED ORDER — 0.9 % SODIUM CHLORIDE (POUR BTL) OPTIME
TOPICAL | Status: DC | PRN
  Administered 2020-09-01: 2000 mL

## 2020-09-01 MED ORDER — BUPIVACAINE HCL (PF) 0.5 % IJ SOLN
INTRAMUSCULAR | Status: AC
  Filled 2020-09-01: qty 30

## 2020-09-01 MED ORDER — SUGAMMADEX SODIUM 200 MG/2ML IV SOLN
INTRAVENOUS | Status: DC | PRN
  Administered 2020-09-01: 250 mg via INTRAVENOUS

## 2020-09-01 MED ORDER — INSULIN ASPART 100 UNIT/ML ~~LOC~~ SOLN
0.0000 [IU] | SUBCUTANEOUS | Status: DC
  Administered 2020-09-01: 2 [IU] via SUBCUTANEOUS

## 2020-09-01 MED ORDER — MORPHINE SULFATE (PF) 2 MG/ML IV SOLN: 1.0000 mg | INTRAVENOUS | Status: DC | PRN

## 2020-09-01 MED ORDER — ALBUMIN HUMAN 5 % IV SOLN: INTRAVENOUS | Status: DC | PRN

## 2020-09-01 MED ORDER — DEXAMETHASONE SODIUM PHOSPHATE 10 MG/ML IJ SOLN
INTRAMUSCULAR | Status: DC | PRN
  Administered 2020-09-01: 5 mg via INTRAVENOUS

## 2020-09-01 MED ORDER — ONDANSETRON HCL 4 MG/2ML IJ SOLN
INTRAMUSCULAR | Status: DC | PRN
  Administered 2020-09-01: 4 mg via INTRAVENOUS

## 2020-09-01 MED ORDER — LACTATED RINGERS IV SOLN: INTRAVENOUS | Status: DC

## 2020-09-01 MED ORDER — ONDANSETRON HCL 4 MG/2ML IJ SOLN: 4.0000 mg | INTRAMUSCULAR | Status: DC | PRN

## 2020-09-01 MED ORDER — PROPOFOL 10 MG/ML IV BOLUS
INTRAVENOUS | Status: DC | PRN
  Administered 2020-09-01: 200 mg via INTRAVENOUS

## 2020-09-01 MED ORDER — CHLORHEXIDINE GLUCONATE 0.12 % MT SOLN
OROMUCOSAL | Status: AC
  Administered 2020-09-01: 15 mL via OROMUCOSAL
  Filled 2020-09-01: qty 15

## 2020-09-01 MED ORDER — ORAL CARE MOUTH RINSE: 15.0000 mL | Freq: Once | OROMUCOSAL | Status: AC

## 2020-09-01 SURGICAL SUPPLY — 86 items
BLADE CLIPPER SURG (BLADE) ×4 IMPLANT
BLADE SURG 11 STRL SS (BLADE) ×4 IMPLANT
CANISTER SUCT 3000ML PPV (MISCELLANEOUS) ×8 IMPLANT
CANNULA REDUC XI 12-8 STAPL (CANNULA)
CANNULA REDUC XI 12-8MM STAPL (CANNULA)
CANNULA REDUCER 12-8 DVNC XI (CANNULA) IMPLANT
CATH THORACIC 28FR (CATHETERS) IMPLANT
CNTNR URN SCR LID CUP LEK RST (MISCELLANEOUS) ×4 IMPLANT
CONT SPEC 4OZ STRL OR WHT (MISCELLANEOUS) ×4
COVER BACK TABLE 60X90IN (DRAPES) IMPLANT
DEFOGGER SCOPE WARMER CLEARIFY (MISCELLANEOUS) ×4 IMPLANT
DERMABOND ADVANCED (GAUZE/BANDAGES/DRESSINGS) ×2
DERMABOND ADVANCED .7 DNX12 (GAUZE/BANDAGES/DRESSINGS) ×2 IMPLANT
DEVICE SUTURE ENDOST 10MM (ENDOMECHANICALS) ×4 IMPLANT
DRAIN PENROSE 1/4X12 LTX STRL (WOUND CARE) ×4 IMPLANT
DRAPE COLUMN DVNC XI (DISPOSABLE) ×8 IMPLANT
DRAPE CV SPLIT W-CLR ANES SCRN (DRAPES) ×4 IMPLANT
DRAPE DA VINCI XI COLUMN (DISPOSABLE) ×8
DRAPE INCISE IOBAN 66X45 STRL (DRAPES) IMPLANT
DRAPE ORTHO SPLIT 77X108 STRL (DRAPES) ×2
DRAPE SLUSH/WARMER DISC (DRAPES) IMPLANT
DRAPE SURG ORHT 6 SPLT 77X108 (DRAPES) ×2 IMPLANT
ELECT REM PT RETURN 9FT ADLT (ELECTROSURGICAL) ×4
ELECTRODE REM PT RTRN 9FT ADLT (ELECTROSURGICAL) ×2 IMPLANT
FELT TEFLON 1X6 (MISCELLANEOUS) ×4 IMPLANT
GAUZE SPONGE 4X4 12PLY STRL (GAUZE/BANDAGES/DRESSINGS) ×4 IMPLANT
GLOVE BIO SURGEON STRL SZ7 (GLOVE) ×4 IMPLANT
GLOVE BIO SURGEON STRL SZ7.5 (GLOVE) ×4 IMPLANT
GLOVE BIOGEL PI IND STRL 6.5 (GLOVE) ×2 IMPLANT
GLOVE BIOGEL PI INDICATOR 6.5 (GLOVE) ×2
GLOVE SURG SS PI 6.5 STRL IVOR (GLOVE) ×4 IMPLANT
GOWN STRL NON-REIN LRG LVL3 (GOWN DISPOSABLE) ×4 IMPLANT
GOWN STRL REUS W/ TWL LRG LVL3 (GOWN DISPOSABLE) ×8 IMPLANT
GOWN STRL REUS W/ TWL XL LVL3 (GOWN DISPOSABLE) ×4 IMPLANT
GOWN STRL REUS W/TWL 2XL LVL3 (GOWN DISPOSABLE) ×4 IMPLANT
GOWN STRL REUS W/TWL LRG LVL3 (GOWN DISPOSABLE) ×8
GOWN STRL REUS W/TWL XL LVL3 (GOWN DISPOSABLE) ×4
GRASPER SUT TROCAR 14GX15 (MISCELLANEOUS) ×4 IMPLANT
HEMOSTAT SURGICEL 2X14 (HEMOSTASIS) IMPLANT
IRRIGATOR SUCT 8 DISP DVNC XI (IRRIGATION / IRRIGATOR) ×2 IMPLANT
IRRIGATOR SUCTION 8MM XI DISP (IRRIGATION / IRRIGATOR) ×2
IV NS 1000ML (IV SOLUTION)
IV NS 1000ML BAXH (IV SOLUTION) IMPLANT
KIT BASIN OR (CUSTOM PROCEDURE TRAY) ×4 IMPLANT
KIT TURNOVER KIT B (KITS) ×4 IMPLANT
MARKER SKIN DUAL TIP RULER LAB (MISCELLANEOUS) ×4 IMPLANT
NEEDLE 18GX1X1/2 (RX/OR ONLY) (NEEDLE) IMPLANT
NS IRRIG 1000ML POUR BTL (IV SOLUTION) ×8 IMPLANT
OBTURATOR OPTICAL STANDARD 8MM (TROCAR) ×2
OBTURATOR OPTICAL STND 8 DVNC (TROCAR) ×2
OBTURATOR OPTICALSTD 8 DVNC (TROCAR) ×2 IMPLANT
OIL SILICONE PENTAX (PARTS (SERVICE/REPAIRS)) ×4 IMPLANT
PACK CHEST (CUSTOM PROCEDURE TRAY) ×4 IMPLANT
PAD ARMBOARD 7.5X6 YLW CONV (MISCELLANEOUS) ×8 IMPLANT
PORT ACCESS TROCAR AIRSEAL 12 (TROCAR) ×2 IMPLANT
PORT ACCESS TROCAR AIRSEAL 5M (TROCAR) ×2
SEAL CANN UNIV 5-8 DVNC XI (MISCELLANEOUS) ×8 IMPLANT
SEAL XI 5MM-8MM UNIVERSAL (MISCELLANEOUS) ×8
SEALER SYNCHRO 8 IS4000 DV (MISCELLANEOUS) ×2
SEALER SYNCHRO 8 IS4000 DVNC (MISCELLANEOUS) ×2 IMPLANT
SET TRI-LUMEN FLTR TB AIRSEAL (TUBING) ×4 IMPLANT
SHEET MEDIUM DRAPE 40X70 STRL (DRAPES) ×4 IMPLANT
STAPLER CANNULA SEAL DVNC XI (STAPLE) IMPLANT
STAPLER CANNULA SEAL XI (STAPLE)
SUT ETHIBOND 0 36 GRN (SUTURE) ×16 IMPLANT
SUT SILK  1 MH (SUTURE) ×2
SUT SILK 1 MH (SUTURE) ×2 IMPLANT
SUT SURGIDAC NAB ES-9 0 48 120 (SUTURE) ×16 IMPLANT
SUT VIC AB 3-0 SH 27 (SUTURE)
SUT VIC AB 3-0 SH 27X BRD (SUTURE) IMPLANT
SUT VIC AB 3-0 X1 27 (SUTURE) ×8 IMPLANT
SUT VICRYL 0 UR6 27IN ABS (SUTURE) ×8 IMPLANT
SYR 10ML LL (SYRINGE) ×4 IMPLANT
SYR 20ML ECCENTRIC (SYRINGE) ×4 IMPLANT
SYR 30ML SLIP (SYRINGE) IMPLANT
SYSTEM SAHARA CHEST DRAIN ATS (WOUND CARE) IMPLANT
TOWEL GREEN STERILE (TOWEL DISPOSABLE) ×4 IMPLANT
TOWEL GREEN STERILE FF (TOWEL DISPOSABLE) ×4 IMPLANT
TRAY FOLEY MTR SLVR 16FR STAT (SET/KITS/TRAYS/PACK) ×4 IMPLANT
TROCAR XCEL BLADELESS 5X75MML (TROCAR) ×4 IMPLANT
TROCAR XCEL NON-BLD 5MMX100MML (ENDOMECHANICALS) IMPLANT
TUBE CONNECTING 20'X1/4 (TUBING) ×1
TUBE CONNECTING 20X1/4 (TUBING) ×3 IMPLANT
TUBING ENDO SMARTCAP (MISCELLANEOUS) ×4 IMPLANT
UNDERPAD 30X36 HEAVY ABSORB (UNDERPADS AND DIAPERS) ×4 IMPLANT
WATER STERILE IRR 1000ML POUR (IV SOLUTION) ×4 IMPLANT

## 2020-09-01 NOTE — Brief Op Note (Signed)
09/01/2020  11:11 AM  PATIENT:  Claudell Kyle  74 y.o. female  PRE-OPERATIVE DIAGNOSIS:  HIATAL HERNIA  POST-OPERATIVE DIAGNOSIS:  HIATAL HERNIA  PROCEDURE:  Procedure(s): XI ROBOTIC ASSISTED HIATAL HERNIA REPAIR WITH FUNDOPLICATION (N/A) ESOPHAGOGASTRODUODENOSCOPY (EGD) (N/A)  SURGEON:  Lightfoot, Lucile Crater, MD - Primary  PHYSICIAN ASSISTANT: Anae Hams  ANESTHESIA:  General  EBL:  50 mL   BLOOD ADMINISTERED:none  DRAINS: none   LOCAL MEDICATIONS USED:  Peri-incisional Exparel  SPECIMEN:  Source of Specimen:  Hernia sack  DISPOSITION OF SPECIMEN:  PATHOLOGY  COUNTS:  YES  DICTATION: .Dragon Dictation  PLAN OF CARE: Admit to inpatient   PATIENT DISPOSITION:  PACU - hemodynamically stable.   Delay start of Pharmacological VTE agent (>24hrs) due to surgical blood loss or risk of bleeding: yes

## 2020-09-01 NOTE — Anesthesia Postprocedure Evaluation (Signed)
Anesthesia Post Note  Patient: Yvonne Jordan  Procedure(s) Performed: XI ROBOTIC ASSISTED HIATAL HERNIA REPAIR WITH FUNDOPLICATION AND GASTROPEXY (N/A Chest) ESOPHAGOGASTRODUODENOSCOPY (EGD) (N/A )     Patient location during evaluation: PACU Anesthesia Type: General Level of consciousness: awake Pain management: pain level controlled Vital Signs Assessment: post-procedure vital signs reviewed and stable Respiratory status: spontaneous breathing Cardiovascular status: stable Postop Assessment: no apparent nausea or vomiting Anesthetic complications: no   No complications documented.  Last Vitals:  Vitals:   09/01/20 1215 09/01/20 1230  BP: (!) 115/59 109/67  Pulse: 91 91  Resp: (!) 22 18  Temp:  36.6 C  SpO2: 90% 91%    Last Pain:  Vitals:   09/01/20 1230  TempSrc:   PainSc: Asleep                 Velda Wendt

## 2020-09-01 NOTE — Anesthesia Preprocedure Evaluation (Signed)
Anesthesia Evaluation  Patient identified by MRN, date of birth, ID band Patient awake    Reviewed: Allergy & Precautions, NPO status , Patient's Chart, lab work & pertinent test results  Airway Mallampati: II  TM Distance: >3 FB     Dental   Pulmonary shortness of breath,    breath sounds clear to auscultation       Cardiovascular negative cardio ROS   Rhythm:Regular Rate:Normal     Neuro/Psych TIACVA    GI/Hepatic Neg liver ROS, hiatal hernia, GERD  ,  Endo/Other    Renal/GU Renal disease     Musculoskeletal   Abdominal   Peds  Hematology   Anesthesia Other Findings   Reproductive/Obstetrics                             Anesthesia Physical Anesthesia Plan  ASA: III  Anesthesia Plan: General   Post-op Pain Management:    Induction: Intravenous  PONV Risk Score and Plan: 3 and Ondansetron, Dexamethasone and Midazolam  Airway Management Planned: Oral ETT  Additional Equipment:   Intra-op Plan:   Post-operative Plan: Possible Post-op intubation/ventilation  Informed Consent: I have reviewed the patients History and Physical, chart, labs and discussed the procedure including the risks, benefits and alternatives for the proposed anesthesia with the patient or authorized representative who has indicated his/her understanding and acceptance.     Dental advisory given  Plan Discussed with: CRNA and Anesthesiologist  Anesthesia Plan Comments:         Anesthesia Quick Evaluation

## 2020-09-01 NOTE — Plan of Care (Signed)
  Problem: Education: Goal: Required Educational Video(s) Outcome: Progressing   Problem: Clinical Measurements: Goal: Postoperative complications will be avoided or minimized Outcome: Progressing   Problem: Skin Integrity: Goal: Demonstration of wound healing without infection will improve Outcome: Progressing   Problem: Health Behavior/Discharge Planning: Goal: Ability to manage health-related needs will improve Outcome: Progressing   Problem: Clinical Measurements: Goal: Ability to maintain clinical measurements within normal limits will improve Outcome: Progressing Goal: Will remain free from infection Outcome: Progressing Goal: Diagnostic test results will improve Outcome: Progressing Goal: Respiratory complications will improve Outcome: Progressing Goal: Cardiovascular complication will be avoided Outcome: Progressing   Problem: Activity: Goal: Risk for activity intolerance will decrease Outcome: Progressing   Problem: Nutrition: Goal: Adequate nutrition will be maintained Outcome: Progressing   Problem: Coping: Goal: Level of anxiety will decrease Outcome: Progressing   Problem: Elimination: Goal: Will not experience complications related to bowel motility Outcome: Progressing   Problem: Pain Managment: Goal: General experience of comfort will improve Outcome: Progressing   Problem: Safety: Goal: Ability to remain free from injury will improve Outcome: Progressing   Problem: Skin Integrity: Goal: Risk for impaired skin integrity will decrease Outcome: Progressing

## 2020-09-01 NOTE — Anesthesia Procedure Notes (Addendum)
Arterial Line Insertion Start/End9/08/2020 7:00 AM, 09/01/2020 7:15 AM Performed by: Verdie Drown, CRNA, CRNA  Patient location: Pre-op. Preanesthetic checklist: patient identified, IV checked, site marked, risks and benefits discussed, surgical consent, monitors and equipment checked, pre-op evaluation, timeout performed and anesthesia consent Lidocaine 1% used for infiltration and patient sedated Left, radial was placed Catheter size: 20 G Hand hygiene performed  and maximum sterile barriers used  Allen's test indicative of satisfactory collateral circulation Attempts: 4 Procedure performed without using ultrasound guided technique. Following insertion, Biopatch and dressing applied. Post procedure assessment: normal  Post procedure complications: local hematoma. Patient tolerated the procedure well with no immediate complications.

## 2020-09-01 NOTE — H&P (Signed)
[address]       [place] [address]             [phone]                    Kambra Marie Belay [place] Medical Record #[id number] Date of Birth: February 05, 1946  Referring: No ref. provider found Primary Care: Antony Contras, MD Primary Cardiologist: Buford Dresser, MD  Chief Complaint:   No chief complaint on file.   History of Present Illness:    Yvonne Jordan 74 y.o. female well known to our service for a lung biopsy to rule out interstitial lung disease.  The biopsies were negative for any concerning findings.  It was noted that she had some features of pneumonitis.  On imaging, she was noted to have a moderate sized hiatal hernia, and swallow study several years ago did show reflux up to the clavicles with some pharyngeal penetration.  Due to concern for silent aspiration, she has been considered for surgical repair of her hiatal hernia, along with a fundoplication.  She continues to have exertional dyspnea.  She also has some reflux, and has been treated with ant-acids for a number of years.  She has some occasional dysphagia      Zubrod Score: At the time of surgery this patient's most appropriate activity status/level should be described as: [x]     0    Normal activity, no symptoms []     1    Restricted in physical strenuous activity but ambulatory, able to do out light work []     2    Ambulatory and capable of self care, unable to do work activities, up and about               >50 % of waking hours                              []     3    Only limited self care, in bed greater than 50% of waking hours []     4    Completely disabled, no self care, confined to bed or chair []     5    Moribund   Past Medical History:  Diagnosis Date  . Allergic rhinitis   . Arthritis   . Cancer ([place])    Breast- left  . CKD (chronic kidney disease)    stage III  . Colon polyp   . Dyspnea   . GERD (gastroesophageal reflux disease)   . History of  hiatal hernia   . Hyperlipidemia   . Personal history of radiation therapy   . Stroke [facility])    TIA before 2013  . TIA (transient ischemic attack)   . Vitamin D deficiency     Past Surgical History:  Procedure Laterality Date  . APPENDECTOMY    . BREAST LUMPECTOMY Left 2006  . BREAST SURGERY     2006...treated with Tamoxifen for 6 years  . CHOLECYSTECTOMY    . COLONOSCOPY      Family History  Problem Relation Age of Onset  . Breast cancer Sister   . Heart attack Sister   . Heart disease Father   . Stroke Father   . Heart disease Maternal Uncle   . Heart disease Maternal Uncle   . Heart disease Maternal Uncle   . Stroke Mother   . Deep vein thrombosis Mother  Social History   Tobacco Use  Smoking Status Never Smoker  Smokeless Tobacco Never Used    Social History   Substance and Sexual Activity  Alcohol Use Yes  . Alcohol/week: 0.0 standard drinks   Comment: 1 drink -every few weeks     No Known Allergies  Current Facility-Administered Medications  Medication Dose Route Frequency Provider Last Rate Last Admin  . ceFAZolin (ANCEF) IVPB 2g/100 mL premix  2 g Intravenous 30 min Pre-Op Mieczyslaw Stamas, Lucile Crater, MD      . lactated ringers infusion   Intravenous Continuous Belinda Block, MD        Review of Systems  Constitutional: Negative.   HENT: Negative for sore throat.   Respiratory: Positive for cough and shortness of breath. Negative for stridor.   Cardiovascular: Negative.   Gastrointestinal: Positive for heartburn. Negative for abdominal pain.  Neurological: Negative.      PHYSICAL EXAMINATION: BP 137/63   Pulse 87   Temp 98.4 F (36.9 C) (Oral)   Resp 18   Ht 5\' 5"  (1.651 m)   Wt 84.9 kg   SpO2 96%   BMI 31.15 kg/m  Physical Exam Constitutional:      General: She is not in acute distress.    Appearance: Normal appearance. She is normal weight. She is not ill-appearing.  Eyes:     Extraocular Movements: Extraocular movements  intact.     Conjunctiva/sclera: Conjunctivae normal.  Cardiovascular:     Rate and Rhythm: Normal rate.  Pulmonary:     Effort: Pulmonary effort is normal. No respiratory distress.  Abdominal:     General: Abdomen is flat. There is no distension.     Palpations: Abdomen is soft.  Musculoskeletal:        General: Normal range of motion.     Cervical back: Normal range of motion.  Skin:    General: Skin is warm and dry.  Neurological:     General: No focal deficit present.     Mental Status: She is alert and oriented to person, place, and time.          I have independently reviewed the above radiology studies  and reviewed the findings with the patient.   Recent Lab Findings: Lab Results  Component Value Date   WBC 8.6 08/30/2020   HGB 13.3 08/30/2020   HCT 42.1 08/30/2020   PLT 259 08/30/2020   GLUCOSE 92 08/30/2020   ALT 18 08/30/2020   AST 35 08/30/2020   NA 137 08/30/2020   K 4.8 08/30/2020   CL 110 08/30/2020   CREATININE 1.00 08/30/2020   BUN 10 08/30/2020   CO2 16 (L) 08/30/2020   INR 1.0 08/30/2020      Assessment / Plan:   74 yo female with moderate sized hiatal hernia, and concern for silent aspiration resulting in pneumonitis. OR today for EGD, robotic assisted hiatal hernia repair, with nissen fundoplication.         Lajuana Matte 09/01/2020 7:07 AM

## 2020-09-01 NOTE — Transfer of Care (Signed)
Immediate Anesthesia Transfer of Care Note  Patient: Yvonne Jordan  Procedure(s) Performed: XI ROBOTIC ASSISTED HIATAL HERNIA REPAIR WITH FUNDOPLICATION AND GASTROPEXY (N/A Chest) ESOPHAGOGASTRODUODENOSCOPY (EGD) (N/A )  Patient Location: PACU  Anesthesia Type:General  Level of Consciousness: awake, alert  and oriented  Airway & Oxygen Therapy: Patient Spontanous Breathing and Patient connected to face mask oxygen  Post-op Assessment: Report given to RN and Post -op Vital signs reviewed and stable  Post vital signs: Reviewed and stable  Last Vitals:  Vitals Value Taken Time  BP    Temp    Pulse    Resp    SpO2     HR 96, RR 18, BP 131/76, sats 94%  Last Pain:  Vitals:   09/01/20 0612  TempSrc:   PainSc: 0-No pain      Patients Stated Pain Goal: 3 (17/61/60 7371)  Complications: No complications documented.

## 2020-09-01 NOTE — Plan of Care (Signed)
  Problem: Clinical Measurements: Goal: Postoperative complications will be avoided or minimized Outcome: Progressing   Problem: Skin Integrity: Goal: Demonstration of wound healing without infection will improve Outcome: Progressing   Problem: Clinical Measurements: Goal: Ability to maintain clinical measurements within normal limits will improve Outcome: Progressing

## 2020-09-01 NOTE — Op Note (Signed)
[address]       [place] [address]             [phone]        09/01/2020  Patient:  Yvonne Jordan Pre-Op Dx: Hiatal hernia   Chronic aspiration   Aspiration pneumonitis  Post-op Dx: Same Procedure: - Esophagoscopy - Robotic assisted laparoscopy -Hiatal hernia repair  -Nissen fundoplication -Gastropexy  Surgeon and Role:      * Lajuana Matte, MD - Primary    *M. Roddenberry, PA-C- assisting  Anesthesia  general EBL: 50 ml Blood Administration: None Specimen: Hernia sac   Counts: correct   Indications: Yvonne Jordan 74 y.o. female well known to our service for a lung biopsy to rule out interstitial lung disease.  The biopsies were negative for any concerning findings.  It was noted that she had some features of pneumonitis.  On imaging, she was noted to have a moderate sized hiatal hernia, and swallow study several years ago did show reflux up to the clavicles with some pharyngeal penetration.  Due to concern for silent aspiration, she has been considered for surgical repair of her hiatal hernia, along with a fundoplication.  She continues to have exertional dyspnea.  She also has some reflux, and has been treated with ant-acids for a number of years.  She has some occasional dysphagia   Findings: On endoscopy the mucosa of the esophagus appeared normal.  There were no nodules or masses.  Moderate sized hiatal hernia.  There is some stomach within the mediastinum.  Operative Technique: After the risks, benefits and alternatives were thoroughly discussed, the patient was brought to the operative theatre.  Anesthesia was induced, and the esophagoscope was passed through the oropharynx down to the stomach.  The scope was retroflexed and the hiatal hernia was clearly evident.  The scope was pulled back and the mucosal surface of the esophagus was visualized.     The scope was then parked at 25 cm from the incisors.  The patient was then  prepped and draped in normal sterile fashion.  An appropriate surgical pause was performed, and pre-operative antibiotics were dosed accordingly.  We began with a 1 cm incision 15 cm caudad from the xiphoid and slightly lateral to the umbilicus.  Using an Optiview we entered the peritoneal space.  The abdomen was then insufflated with CO2.  3 other robotic ports were placed to triangulate the hiatus.  Another 12 mm port was placed in place at the level of the umbilicus laterally for an assistant port and another 5 mm trocar was placed in the right lower quadrant for liver retractor.  The patient was then placed in steep reverse Trendelenburg and the liver was elevated to expose the esophageal hiatus.  And then the robot was docked.  We began by dividing the gastrohepatic ligament to expose the right diaphragmatic crus and then dissected the hernia sac in a clockwise fashion to mobilize there the stomach and esophagus.  We then divided the short gastrics and moved towards the right crus and completed our dissection along the esophageal hiatus.  A Penrose drain was then used to encircle the the esophagus and we continued our dissection up into the mediastinum.  Once we had achieved 3 to 4 cm of intra-abdominal esophagus we then proceeded to reapproximate the crura with 0 Ethibond sutures in an interrupted fashion.  The gastroscope was passed down through the lower esophageal sphincter into the stomach  and would act as our bougie during this repair.  Next the stomach was passed posterior to the esophagus and Nissen fundoplication was performed.  An air leak test was performed using the gastroscope.  No leak was evident.  The robot was undocked, and next using an Endo Stitch 3 transfascial sutures were placed to pexied the stomach up to the abdominal wall.  The liver retractor was removed and all ports were removed under direct visualization.  The skin and soft tissue were closed with absorbable suture    The  patient tolerated the procedure without any immediate complications, and was transferred to the PACU in stable condition.  Yvonne Jordan Yvonne Jordan

## 2020-09-01 NOTE — Anesthesia Procedure Notes (Signed)
Procedure Name: Intubation Date/Time: 09/01/2020 7:37 AM Performed by: Belinda Block, MD Pre-anesthesia Checklist: Patient identified, Emergency Drugs available, Suction available and Patient being monitored Patient Re-evaluated:Patient Re-evaluated prior to induction Oxygen Delivery Method: Circle System Utilized Preoxygenation: Pre-oxygenation with 100% oxygen Induction Type: IV induction and Rapid sequence Ventilation: Mask ventilation without difficulty Laryngoscope Size: Miller and 2 Grade View: Grade I Tube type: Oral Tube size: 7.0 mm Number of attempts: 3 Airway Equipment and Method: Stylet and Oral airway Placement Confirmation: ETT inserted through vocal cords under direct vision,  positive ETCO2 and breath sounds checked- equal and bilateral Secured at: 22 cm Tube secured with: Tape Dental Injury: Teeth and Oropharynx as per pre-operative assessment  Difficulty Due To: Difficulty was unanticipated and Difficult Airway- due to anterior larynx Comments: Recommend Miller. 1 attempt by SRNA with MAC 4, 1 attempt CRNA MAC 4. Successful placement MD with Sabra Heck 2

## 2020-09-01 NOTE — Anesthesia Procedure Notes (Deleted)
Performed by: Wilburn Cornelia, CRNA

## 2020-09-02 ENCOUNTER — Inpatient Hospital Stay (HOSPITAL_COMMUNITY): Payer: Medicare Other

## 2020-09-02 ENCOUNTER — Encounter (HOSPITAL_COMMUNITY): Payer: Self-pay | Admitting: Thoracic Surgery (Cardiothoracic Vascular Surgery)

## 2020-09-02 LAB — GLUCOSE, CAPILLARY
Glucose-Capillary: 99 mg/dL (ref 70–99)
Glucose-Capillary: 94 mg/dL (ref 70–99)
Glucose-Capillary: 117 mg/dL — ABNORMAL HIGH (ref 70–99)
Glucose-Capillary: 109 mg/dL — ABNORMAL HIGH (ref 70–99)
Glucose-Capillary: 102 mg/dL — ABNORMAL HIGH (ref 70–99)

## 2020-09-02 LAB — SURGICAL PATHOLOGY

## 2020-09-02 LAB — BASIC METABOLIC PANEL
Anion gap: 9 (ref 5–15)
BUN: 11 mg/dL (ref 8–23)
CO2: 21 mmol/L — ABNORMAL LOW (ref 22–32)
Calcium: 8.1 mg/dL — ABNORMAL LOW (ref 8.9–10.3)
Chloride: 111 mmol/L (ref 98–111)
Creatinine, Ser: 1.71 mg/dL — ABNORMAL HIGH (ref 0.44–1.00)
GFR calc Af Amer: 34 mL/min — ABNORMAL LOW (ref >60)
GFR calc non Af Amer: 29 mL/min — ABNORMAL LOW (ref >60)
Glucose, Bld: 102 mg/dL — ABNORMAL HIGH (ref 70–99)
Potassium: 4.1 mmol/L (ref 3.5–5.1)
Sodium: 141 mmol/L (ref 135–145)

## 2020-09-02 LAB — CBC
HCT: 29.7 % — ABNORMAL LOW (ref 36.0–46.0)
Hemoglobin: 9.6 g/dL — ABNORMAL LOW (ref 12.0–15.0)
MCH: 25.6 pg — ABNORMAL LOW (ref 26.0–34.0)
MCHC: 32.3 g/dL (ref 30.0–36.0)
MCV: 79.2 fL — ABNORMAL LOW (ref 80.0–100.0)
Platelets: 198 10*3/uL (ref 150–400)
RBC: 3.75 MIL/uL — ABNORMAL LOW (ref 3.87–5.11)
RDW: 15.5 % (ref 11.5–15.5)
WBC: 9.7 10*3/uL (ref 4.0–10.5)
nRBC: 0 % (ref 0.0–0.2)

## 2020-09-02 IMAGING — DX DG CHEST 1V PORT
1 series · 1 of 1 positions shown · non-contrast
Comparison: [DATE].

CLINICAL DATA: Chest and abdominal pain. Repair pair esophageal
hernia.

EXAM:
PORTABLE CHEST 1 VIEW

[chest]
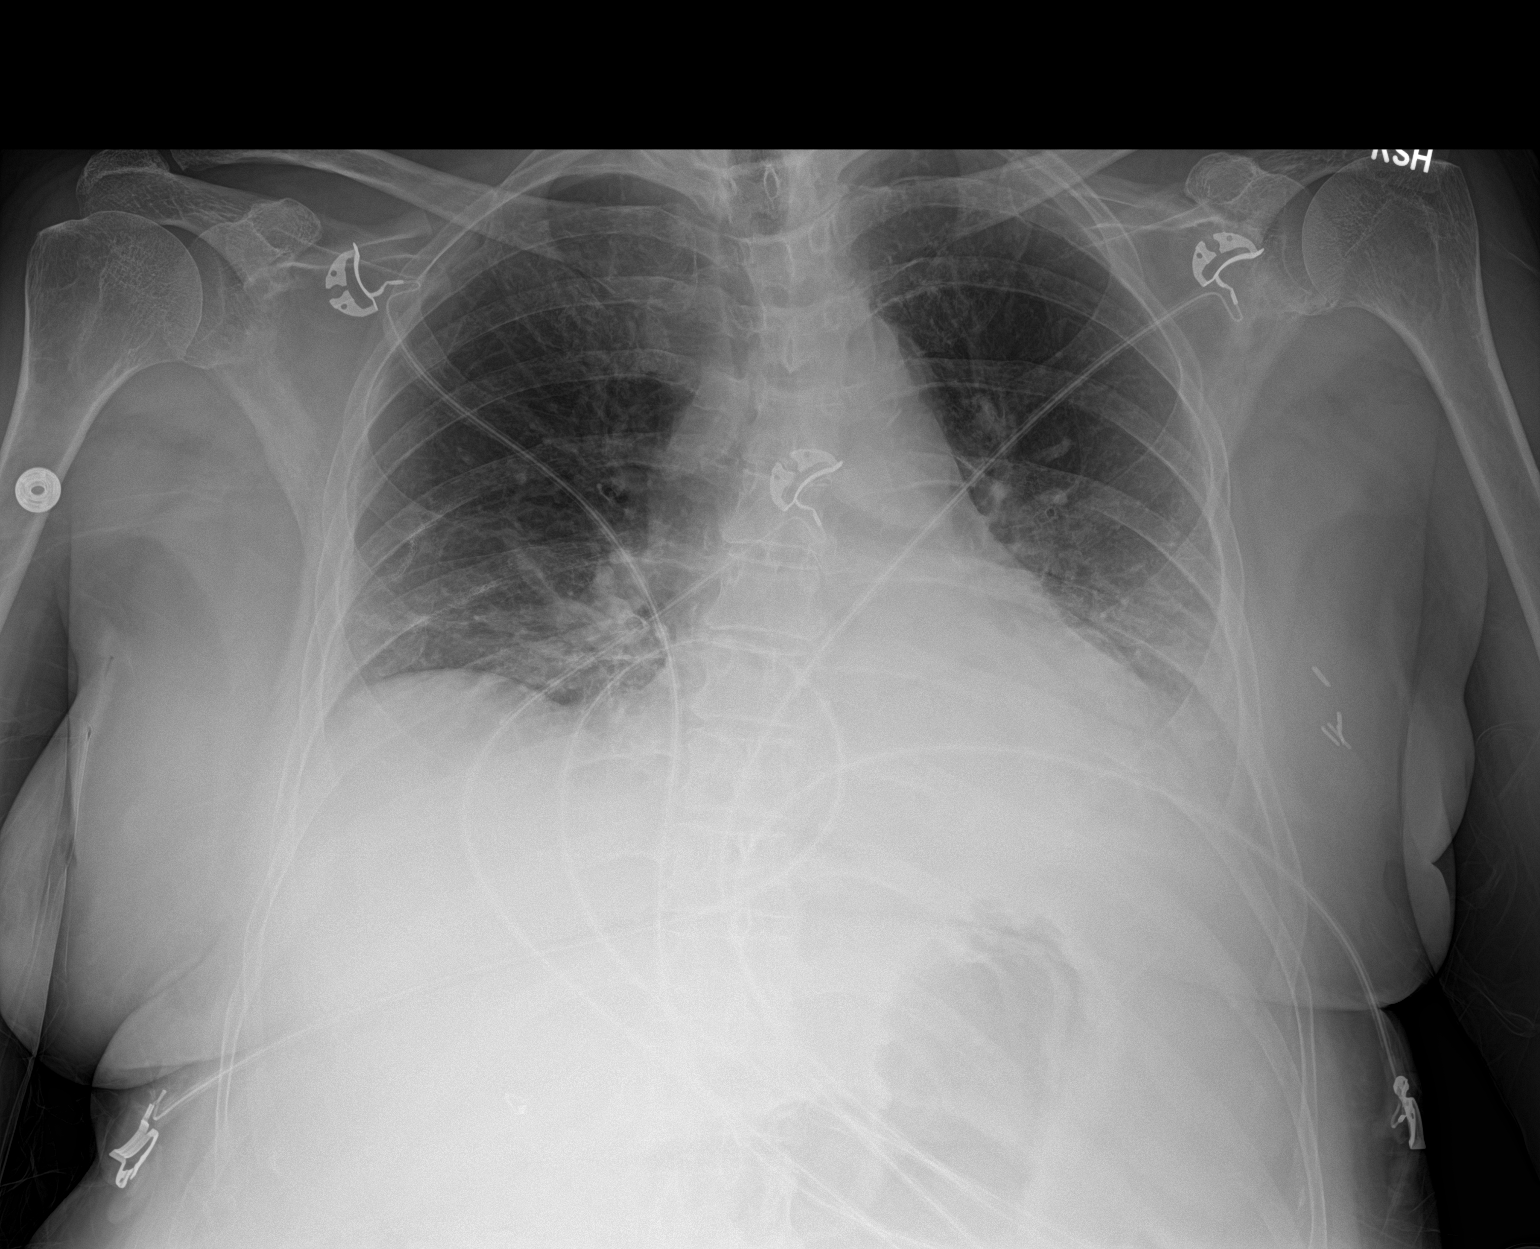

[1 of 1 positions shown; findings below may reference images not displayed]

FINDINGS: Mediastinum and hilar structures normal. Heart size normal.
Progressive bibasilar atelectasis/infiltrates. Small left pleural
effusion. No pneumothorax. Surgical clips left chest. Surgical clips
right upper quadrant. Thoracic spine scoliosis.
IMPRESSION: Progressive bibasilar atelectasis/infiltrates. Small left pleural
effusion.

## 2020-09-02 IMAGING — RF DG ESOPHAGUS
9 series · 24 of 24 positions shown · non-contrast
Comparison: CT chest [DATE]

CLINICAL DATA: Postoperative for Nissen fundoplication

EXAM:
ESOPHOGRAM/BARIUM SWALLOW
TECHNIQUE: Single contrast examination was performed using water-soluble
contrast.
FLUOROSCOPY TIME:  Fluoroscopy Time:  1 minutes, 30 seconds in
Radiation Exposure Index (if provided by the fluoroscopic device):
15.8 mGy
Number of Acquired Spot Images: 0

[Series 1: cp_standard · 0.51mm/px · 3 of 12 frames shown (1 of 9)]
[frame 1/12]
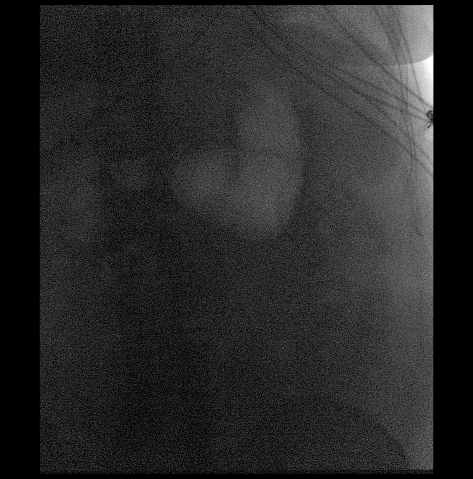
[frame 2/12]
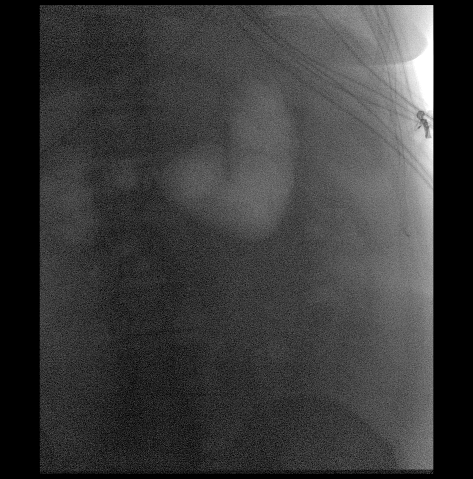
[frame 11/12]
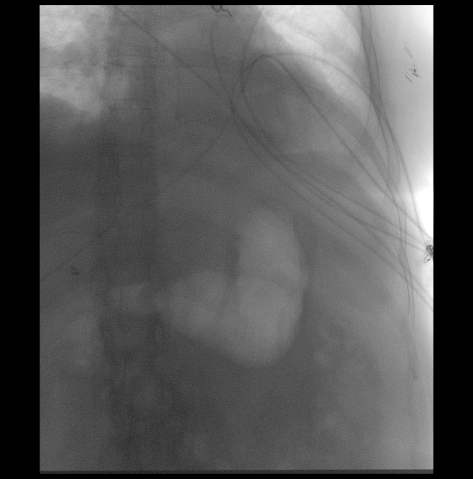

[Series 2: cp_standard · 0.51mm/px · 3 of 46 frames shown (2 of 9)]
[frame 7/46]
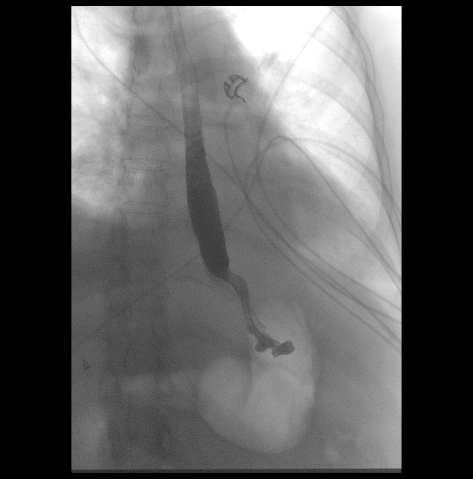
[frame 24/46]
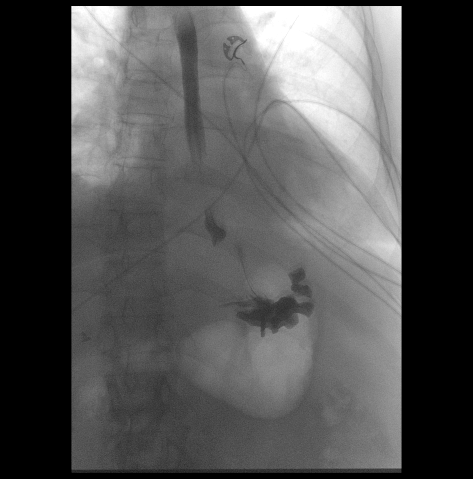
[frame 40/46]
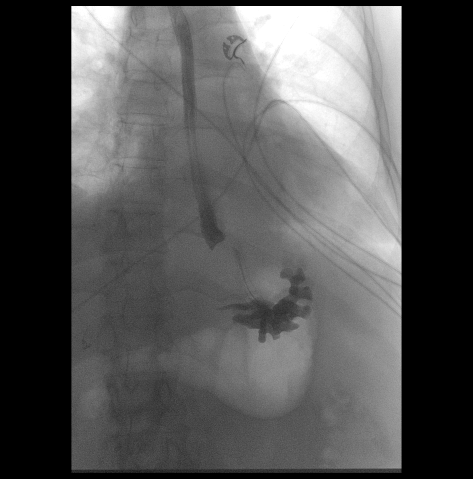

[Series 3: cp_standard · 0.51mm/px · 2 of 18 frames shown (3 of 9)]
[frame 10/18]
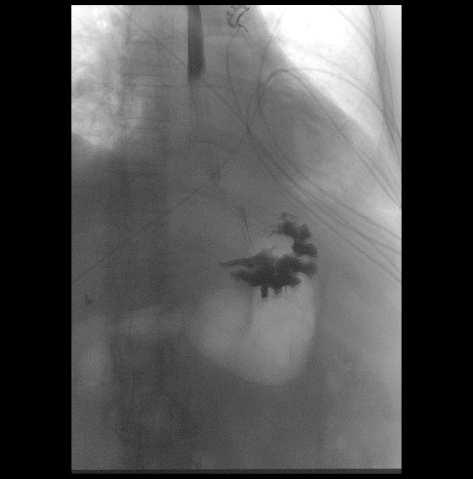
[frame 16/18]
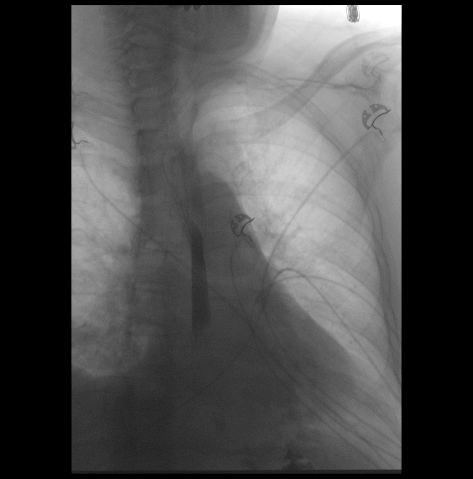

[Series 4: cp_standard · 0.34mm/px · 3 of 47 frames shown (4 of 9)]
[frame 8/47]
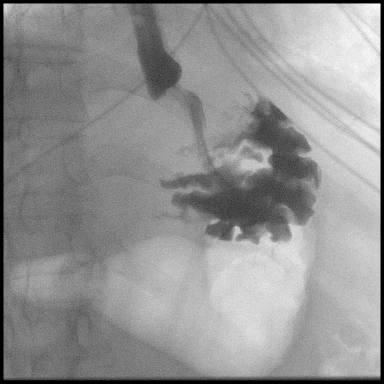
[frame 24/47]
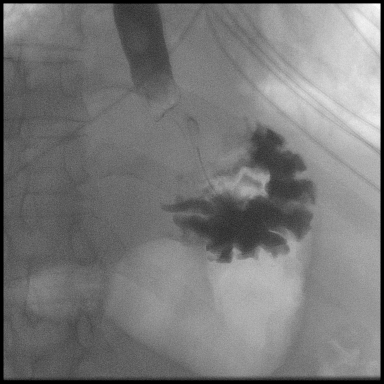
[frame 47/47  full-range]
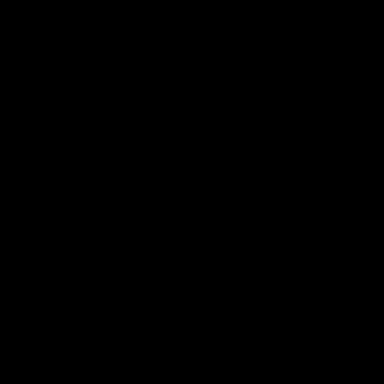

[Series 5: cp_standard · 0.34mm/px · 3 of 37 frames shown (5 of 9)]
[frame 6/37]
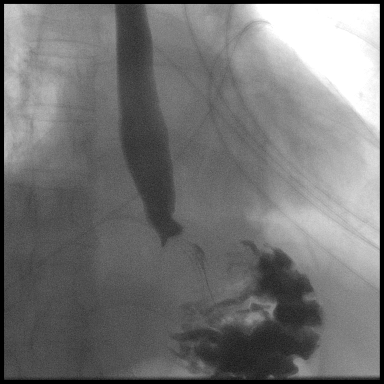
[frame 19/37]
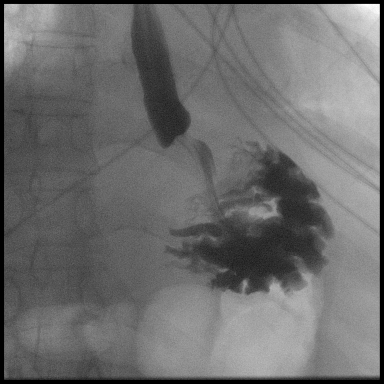
[frame 32/37]
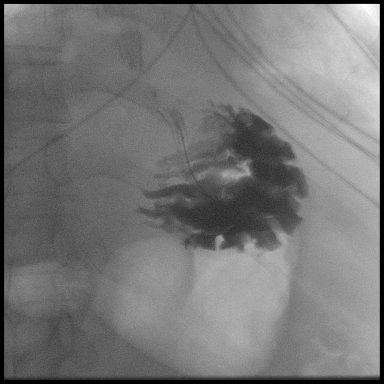

[Series 6: cp_standard · 0.34mm/px · 2 of 16 frames shown (6 of 9)]
[frame 9/16]
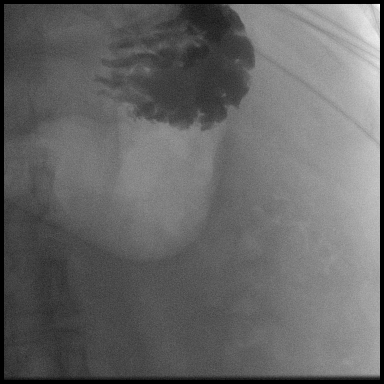
[frame 14/16]
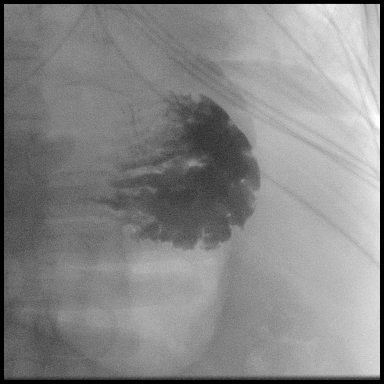

[Series 7: cp_standard · 0.34mm/px · 2 of 34 frames shown (7 of 9)]
[frame 8/34]
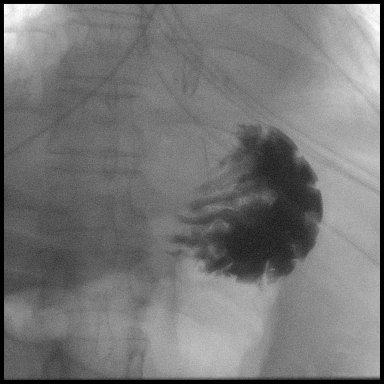
[frame 18/34]
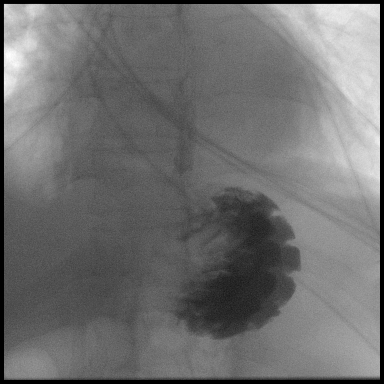

[Series 8: cp_standard · 0.34mm/px · 3 of 17 frames shown (8 of 9)]
[frame 3/17]
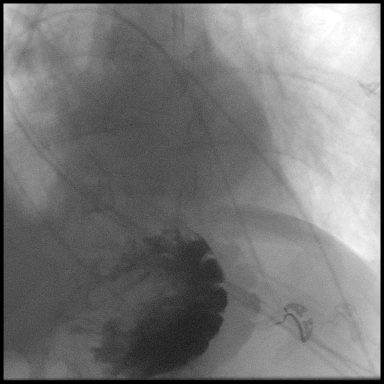
[frame 7/17]
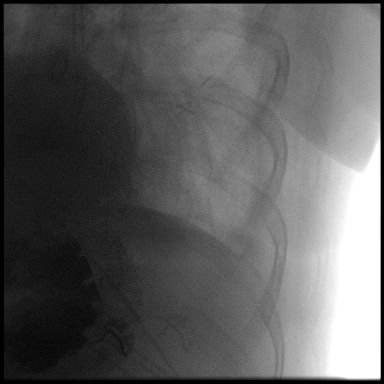
[frame 15/17]
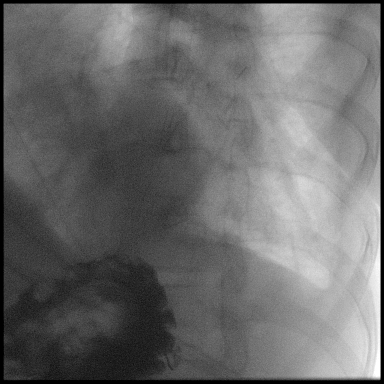

[Series 9: cp_standard · 0.34mm/px · 3 of 37 frames shown (9 of 9)]
[frame 1/37]
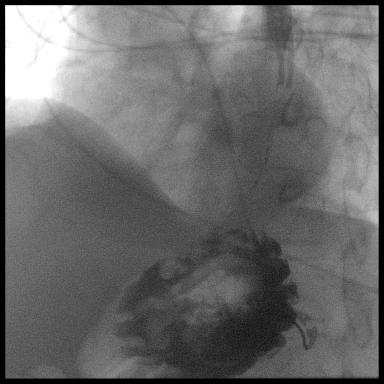
[frame 19/37]
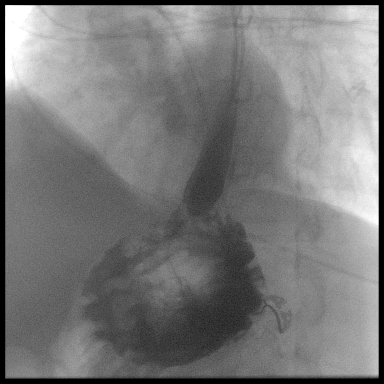
[frame 32/37]
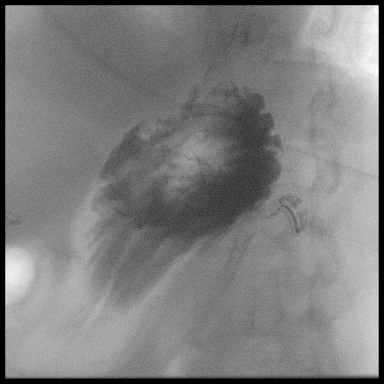

[24 of 24 positions shown; findings below may reference images not displayed]

FINDINGS: Water-soluble esophagram demonstrates no evidence of leak at the
fundoplication site. Maximum diameter of the lumen at the
fundoplication site is approximately 0.6 cm at this time. Contrast
medium extends into the stomach adequately.
IMPRESSION: No leak or complicating feature, status post fundoplication.

## 2020-09-02 MED ORDER — IOHEXOL 300 MG/ML  SOLN
75.0000 mL | Freq: Once | INTRAMUSCULAR | Status: AC | PRN
  Administered 2020-09-02: 75 mL via ORAL

## 2020-09-02 NOTE — Plan of Care (Signed)
  Problem: Clinical Measurements: Goal: Postoperative complications will be avoided or minimized Outcome: Progressing   Problem: Skin Integrity: Goal: Demonstration of wound healing without infection will improve Outcome: Progressing   Problem: Health Behavior/Discharge Planning: Goal: Ability to manage health-related needs will improve Outcome: Progressing   Problem: Clinical Measurements: Goal: Ability to maintain clinical measurements within normal limits will improve Outcome: Progressing

## 2020-09-02 NOTE — Plan of Care (Signed)
  Problem: Clinical Measurements: Goal: Postoperative complications will be avoided or minimized Outcome: Progressing   Problem: Skin Integrity: Goal: Demonstration of wound healing without infection will improve Outcome: Progressing   Problem: Clinical Measurements: Goal: Will remain free from infection Outcome: Progressing Goal: Respiratory complications will improve Outcome: Progressing   Problem: Nutrition: Goal: Adequate nutrition will be maintained Outcome: Progressing   Problem: Coping: Goal: Level of anxiety will decrease Outcome: Progressing

## 2020-09-02 NOTE — Discharge Summary (Signed)
Physician Discharge Summary  Patient ID: Yvonne Jordan MRN: [id number] DOB/AGE: Oct 24, 1946 74 y.o.  Admit date: 09/01/2020 Discharge date: 09/03/2020  Admission Diagnoses:  Paraesophageal hernia History of TIA History of chronic kidney disease History of interstitial lung disease  Discharge Diagnoses:   Paraesophageal hernia S/P repair of paraesophageal hernia History of TIA History of chronic kidney disease History of interstitial lung disease   Discharged Condition: stable   History of Present Illness:  Yvonne Jordan [id number] y.o.femalewho was originally referred to our service for surgical evaluation of progressive shortness of breath. She has been evaluated by Dr. Vaughan Browner, and there is concern that she may have an interstitial lung disease. She underwent robotic-assistedwedge biopsies of the right upper, middle, and lower lobes by Dr. Kipp Brood on 07/21/20 that demonstrated minimal emphysematous changes but no fibrosis or architectural distortion. She was noted on CT scan of the chest to have a hiatal hernia. After discussion with her pulmonologist it was determined that her symptoms were likely related pneumonitis resulting from her gastroesophageal reflux disease and hiatal hernia.  Surgical repair of the hiatal hernia was offered to her by Dr. Kipp Brood and she elected to proceed.     Hospital Course:  Yvonne Jordan was admitted to the hospital for elective surgery on 09/01/20.  She was taken to the OR where robotic-assisted repair of the paraesophageal hernia was performed along with fundoplication. She tolerated the procedure well and after initial recovery in the PACU she was transferred to [facility]. She remained NPO until an esophagram on POD1 demonstrated no complicating features of the repair. She was then given a soft diet which she tolerated well. Pain was managed with IV and oral analgesics initially and later converted to only oral medications. She was  mobilized and made a slow but progressive recovery of independent mobility. Her incisions appeared to be healing with no evidnence of complication.  She had an increase in her creatinine on the first post-op day. Her Toradol was discontinued and follow up creatinine on the following day had decrease to 1.06 She was felt to be ready for discharge to home on 09/03/20.    Consults: None  Significant Diagnostic Studies:   EXAM: CT CHEST WITHOUT CONTRAST  TECHNIQUE: Multidetector CT imaging of the chest was performed following the standard protocol without intravenous contrast. High resolution imaging of the lungs, as well as inspiratory and expiratory imaging, was performed.  COMPARISON:  05/07/2019 coronary CT.  FINDINGS: Cardiovascular: Normal heart size. No significant pericardial effusion/thickening. Atherosclerotic nonaneurysmal thoracic aorta. Normal caliber pulmonary arteries.  Mediastinum/Nodes: Multiple bilateral hypodense thyroid nodules, largest 2.5 cm on the right. Unremarkable esophagus. No pathologically enlarged axillary, mediastinal or hilar lymph nodes, noting limited sensitivity for the detection of hilar adenopathy on this noncontrast study. Surgical clips in the left axilla.  Lungs/Pleura: No pneumothorax. No pleural effusion. Solid 3 mm right middle lobe pulmonary nodule (series 5/image 81), stable, considered benign. No acute consolidative airspace disease, lung masses or new significant pulmonary nodules. Moderate patchy air trapping in both lungs on the expiration sequence, without evidence of tracheobronchomalacia. Mild patchy ground-glass opacity with a dependent lower lobe predominance. Minimal traction bronchiolectasis in basilar lower lobes. No significant regions of architectural distortion or frank honeycombing.  Upper abdomen: Small hiatal hernia. Scattered simple small liver cysts, largest 1.9 cm in the left liver lobe.  Cholecystectomy.  Musculoskeletal: No aggressive appearing focal osseous lesions. Mild thoracic spondylosis.  IMPRESSION: 1. Moderate patchy air trapping in both lungs, indicative of small airways  disease. Mild patchy ground-glass opacity and minimal traction bronchiolectasis with a dependent lower lobe predominance. No honeycombing. Findings are suggestive of a mild interstitial lung disease such as nonspecific interstitial pneumonia (NSIP) or chronic hypersensitivity pneumonitis. Findings are suggestive of an alternative diagnosis (not UIP) per consensus guidelines: Diagnosis of Idiopathic Pulmonary Fibrosis: An Official Clinical Practice Guideline. [place], 5, -, 19 2018. 2. Small hiatal hernia. 3. Multiple bilateral hypodense thyroid nodules, largest 2.5 cm on the right. Recommend thyroid US (ref: J Am Coll Radiol. 2015 Feb;12(2): 143-50). 4. Aortic Atherosclerosis (ICD10-I70.0).   Electronically Signed   By: Ilona Sorrel M.D.   On: 06/17/2020 15:03   Treatments:   09/01/2020  OPERATIVE NOTE  Patient:  Yvonne Jordan Pre-Op Dx: Hiatal hernia                         Chronic aspiration                         Aspiration pneumonitis            Post-op Dx: Same Procedure: - Esophagoscopy - Robotic assisted laparoscopy -Hiatal hernia repair  -Nissen fundoplication -Gastropexy  Surgeon and Role:      * Lajuana Matte, MD - Primary    *M. Jonte Wollam, PA-C- assisting  Anesthesia  general EBL: 50 ml Blood Administration: None Specimen: Hernia sac   Counts: correct   Indications: Yvonne Jordan [id number] y.o.femalewell known to our service for a lung biopsy to rule out interstitial lung disease. The biopsies were negative for any concerning findings. It was noted that she had some features of pneumonitis. On imaging, she was noted to have a moderate sized hiatal hernia, and swallow study several years ago did show reflux up to  the clavicles with some pharyngeal penetration. Due to concern for silent aspiration, she has been considered for surgical repair of her hiatal hernia, along with a fundoplication. She continues to have exertional dyspnea. She also has some reflux, and has been treated with ant-acids for a number of years. She has some occasional dysphagia   Findings: On endoscopy the mucosa of the esophagus appeared normal.  There were no nodules or masses.  Moderate sized hiatal hernia.  There is some stomach within the mediastinum.  Discharge Exam: Blood pressure 139/81, pulse 91, temperature 98 F (36.7 C), temperature source Oral, resp. rate (!) 26, height 5\' 5"  (1.651 m), weight 84.9 kg, SpO2 90 %.  General appearance: alert, cooperative and mild distress Neurologic: intact Heart: SR. Lungs: Breath sounds are clear, on RA currently.  Abdomen: soft, less tender, active bowel sounds. Wound: Abdominal incisions have expected bruising but are dry and intact.   Disposition:  Yvonne Jordan is discharged to home in stable condition.    Allergies as of 09/03/2020   No Known Allergies     Medication List    STOP taking these medications   CALCIUM/D3 ADULT GUMMIES PO   multivitamin capsule   Vitamin D3 50 MCG (2000 UT) Tabs     TAKE these medications   acetaminophen 325 MG tablet Commonly known as: TYLENOL Take 650 mg by mouth every 6 (six) hours as needed for moderate pain.   atorvastatin 20 MG tablet Commonly known as: LIPITOR Take 20 mg by mouth daily.   clopidogrel 75 MG tablet Commonly known as: PLAVIX Take 75 mg by mouth daily with breakfast.   HYDROcodone-acetaminophen 7.5-325 mg/15  ml solution Commonly known as: HYCET Take 10 mLs by mouth every 4 (four) hours as needed for up to 5 days for severe pain.   ondansetron 4 MG disintegrating tablet Commonly known as: ZOFRAN-ODT Take 1 tablet (4 mg total) by mouth every 8 (eight) hours as needed for nausea or vomiting.    pantoprazole 40 MG tablet Commonly known as: PROTONIX Take 1 tablet (40 mg total) by mouth 2 (two) times daily before a meal. For 30 days What changed: when to take this   SYSTANE OP Place 1 drop into both eyes 2 (two) times daily as needed (dry eyes).   tretinoin 0.025 % cream Commonly known as: RETIN-A Apply 1 application topically at bedtime.   Voltaren 1 % Gel Generic drug: diclofenac Sodium Voltaren 1 % topical gel  APPLY 2 GRAMS TO THE AFFECTED AREA(S) BY TOPICAL ROUTE 4 TIMES PER DAY       Follow-up Information    Lajuana Matte, MD. Go on 09/09/2020.   Specialty: Cardiothoracic Surgery Why: Your follow up appointment with Dr. Kipp Brood is at 11:30am. Contact information: [address] [place] [address] [phone]               Signed: Antony Odea 09/03/2020, 12:06 PM

## 2020-09-02 NOTE — Progress Notes (Signed)
RT in to collect scheduled ABG. Explained to pt that ABG is a follow up to procedure she had done yesterday. Pt states she's "had so much blood drawn, that she'd rather not". Pt in no obvious respiratory distress, SpO2 92% on RA. RT will continue to monitor.

## 2020-09-02 NOTE — Progress Notes (Signed)
     [address]       [place] [address]             [phone]       Overall doing well. Does have some pain and some mobility issues.  Today's Vitals   09/01/20 1928 09/01/20 2348 09/02/20 0341 09/02/20 0611  BP: 111/80 125/61 119/71   Pulse: 83 84 84   Resp: (!) 23 20 15    Temp: 98.2 F (36.8 C) 98.2 F (36.8 C) 98.1 F (36.7 C)   TempSrc: Oral Oral Oral   SpO2: 93% 91% 93%   Weight:      Height:      PainSc: 0-No pain 0-No pain 0-No pain 3    Body mass index is 31.15 kg/m.  Alert NAD Sinus Easy work of breathing Abdomen soft Incision is intact  Labs reviewed. Creatinine trending up slightly.  Postoperative day 1 status post robotic assisted hiatal hernia repair with Nissen fundoplication and gastropexy. Overall doing well. Esophagram did not show any leak and good entry into the stomach.  Will advance diet to soft mechanical diet. Have DC'd the Toradol given the rising creatinine.  Will need more pain control for ambulation. Dispo planning.  Patient will need transportation given that her husband does not drive. We will likely go home this weekend pending case management.  [place] [facility] Bary Leriche

## 2020-09-03 LAB — GLUCOSE, CAPILLARY
Glucose-Capillary: 102 mg/dL — ABNORMAL HIGH (ref 70–99)
Glucose-Capillary: 124 mg/dL — ABNORMAL HIGH (ref 70–99)
Glucose-Capillary: 116 mg/dL — ABNORMAL HIGH (ref 70–99)

## 2020-09-03 LAB — BASIC METABOLIC PANEL
Anion gap: 12 (ref 5–15)
BUN: 14 mg/dL (ref 8–23)
CO2: 18 mmol/L — ABNORMAL LOW (ref 22–32)
Calcium: 8.6 mg/dL — ABNORMAL LOW (ref 8.9–10.3)
Chloride: 109 mmol/L (ref 98–111)
Creatinine, Ser: 1.06 mg/dL — ABNORMAL HIGH (ref 0.44–1.00)
GFR calc Af Amer: 60 mL/min — ABNORMAL LOW (ref >60)
GFR calc non Af Amer: 52 mL/min — ABNORMAL LOW (ref >60)
Glucose, Bld: 121 mg/dL — ABNORMAL HIGH (ref 70–99)
Potassium: 3.8 mmol/L (ref 3.5–5.1)
Sodium: 139 mmol/L (ref 135–145)

## 2020-09-03 MED ORDER — ONDANSETRON 4 MG PO TBDP: 4.0000 mg | ORAL_TABLET | Freq: Three times a day (TID) | ORAL | 0 refills | Status: DC | PRN

## 2020-09-03 MED ORDER — ONDANSETRON 4 MG PO TBDP: 4.0000 mg | ORAL_TABLET | Freq: Three times a day (TID) | ORAL | Status: DC | PRN

## 2020-09-03 MED ORDER — HYDROCODONE-ACETAMINOPHEN 7.5-325 MG/15ML PO SOLN
10.0000 mL | ORAL | 0 refills | Status: AC | PRN
Start: 2020-09-03 — End: 2020-09-08

## 2020-09-03 NOTE — Progress Notes (Signed)
      [address]       [place] [address]             [phone]      2 Days Post-Op Procedure(s) (LRB): XI ROBOTIC ASSISTED HIATAL HERNIA REPAIR WITH FUNDOPLICATION AND GASTROPEXY (N/A) ESOPHAGOGASTRODUODENOSCOPY (EGD) (N/A) Subjective: Walked in the hall this morning. Pain control better, now on oral analgesics.  Says she has not attempted to eat much other than apple sauce this morning because she feels like food is "getting stuck".  Objective: Vital signs in last 24 hours: Temp:  [97.8 F (36.6 C)-98.4 F (36.9 C)] 98 F (36.7 C) (09/11 0734) Pulse Rate:  [82-99] 91 (09/11 0803) Cardiac Rhythm: Normal sinus rhythm (09/11 0710) Resp:  [15-26] 26 (09/11 0803) BP: (118-144)/(62-104) 139/81 (09/11 0803) SpO2:  [90 %-94 %] 90 % (09/11 0803)     Intake/Output from previous day: 09/10 0701 - 09/11 0700 In: -  Out: 1200 [Urine:1200] Intake/Output this shift: No intake/output data recorded.  General appearance: alert, cooperative and mild distress Neurologic: intact Heart: SR. Lungs: Breath sounds are clear, on RA currently.  Abdomen: soft, less tender, active bowel sounds. Wound: Abdominal incisions have expected bruising but are dry and intact.   Lab Results: Recent Labs    09/01/20 1833 09/02/20 0016  WBC 12.7* 9.7  HGB 10.9* 9.6*  HCT 34.1* 29.7*  PLT 221 198   BMET:  Recent Labs    09/01/20 1833 09/02/20 0016  NA 139 141  K 4.7 4.1  CL 110 111  CO2 19* 21*  GLUCOSE 129* 102*  BUN 11 11  CREATININE 1.41* 1.71*  CALCIUM 8.4* 8.1*    PT/INR: No results for input(s): LABPROT, INR in the last 72 hours. ABG    Component Value Date/Time   PHART 7.379 09/01/2020 0713   HCO3 20.2 09/01/2020 0713   ACIDBASEDEF 4.0 (H) 09/01/2020 0713   O2SAT 95.1 09/01/2020 0713   CBG (last 3)  Recent Labs    09/02/20 2318 09/03/20 0407 09/03/20 0745  GLUCAP 102* 102* 124*    Assessment/Plan: S/P Procedure(s) (LRB): XI ROBOTIC ASSISTED  HIATAL HERNIA REPAIR WITH FUNDOPLICATION AND GASTROPEXY (N/A) ESOPHAGOGASTRODUODENOSCOPY (EGD) (N/A)  -POD-2 robotic-assisted repair of paraesophageal hernia and [employer] fundoplication. Says she tolerated the mechanical soft diet yesterday but hasn't attempted to eat much today. Encouraged PO's.  -Acute renal insufficiency- Toradol discontinued. Recheck BMP this AM.     LOS: 2 days    Antony Odea, PA-C [phone] 09/03/2020

## 2020-09-03 NOTE — Discharge Instructions (Signed)
Discharge Instructions:  1. You may shower, please wash incisions daily with soap and water and keep dry.  If you wish to cover wounds with dressing you may do so but please keep clean and change daily.  No tub baths or swimming until incisions have completely healed.  If your incisions become red or develop any drainage please call our office at [phone]  2. No Driving until cleared by [place] office and you are no longer using narcotic pain medications  3. Fever of 101.5 for at least 24 hours with no source, please contact our office at [phone]  4. If any questions or concerns arise, please do not hesitate to contact our office at [phone]  5. Crush all tablet medications and take with applesauce or similar soft food.  Food Choices for Gastroesophageal Reflux Disease, Adult When you have gastroesophageal reflux disease (GERD), the foods you eat and your eating habits are very important. Choosing the right foods can help ease the discomfort of GERD. Consider working with a diet and nutrition specialist (dietitian) to help you make healthy food choices. What general guidelines should I follow?  Eating plan  Choose healthy foods low in fat, such as fruits, vegetables, whole grains, low-fat dairy products, and lean meat, fish, and poultry.  Eat frequent, small meals instead of three large meals each day. Eat your meals slowly, in a relaxed setting. Avoid bending over or lying down until 2-3 hours after eating.  Limit high-fat foods such as fatty meats or fried foods.  Limit your intake of oils, butter, and shortening to less than 8 teaspoons each day.  Avoid the following: ? Foods that cause symptoms. These may be different for different people. Keep a food diary to keep track of foods that cause symptoms. ? Alcohol. ? Drinking large amounts of liquid with meals. ? Eating meals during the 2-3 hours before bed.  Cook foods using methods other than frying. This may  include baking, grilling, or broiling. Lifestyle  Maintain a healthy weight. Ask your health care provider what weight is healthy for you. If you need to lose weight, work with your health care provider to do so safely.  Exercise for at least 30 minutes on 5 or more days each week, or as told by your health care provider.  Avoid wearing clothes that fit tightly around your waist and chest.  Do not use any products that contain nicotine or tobacco, such as cigarettes and e-cigarettes. If you need help quitting, ask your health care provider.  Sleep with the head of your bed raised. Use a wedge under the mattress or blocks under the bed frame to raise the head of the bed. What foods are not recommended? The items listed may not be a complete list. Talk with your dietitian about what dietary choices are best for you. Grains Pastries or quick breads with added fat. [place] toast. Vegetables Deep fried vegetables. [place] fries. Any vegetables prepared with added fat. Any vegetables that cause symptoms. For some people this may include tomatoes and tomato products, chili peppers, onions and garlic, and horseradish. Fruits Any fruits prepared with added fat. Any fruits that cause symptoms. For some people this may include citrus fruits, such as oranges, grapefruit, pineapple, and lemons. Meats and other protein foods High-fat meats, such as fatty beef or pork, hot dogs, ribs, ham, sausage, salami and bacon. Fried meat or protein, including fried fish and fried chicken. Nuts and nut butters. Dairy Whole milk and chocolate milk. Sour cream.  Cream. Ice cream. Cream cheese. Milk shakes. Beverages Coffee and tea, with or without caffeine. Carbonated beverages. Sodas. Energy drinks. Fruit juice made with acidic fruits (such as orange or grapefruit). Tomato juice. Alcoholic drinks. Fats and oils Butter. Margarine. Shortening. Ghee. Sweets and desserts Chocolate and cocoa. Donuts. Seasoning and other  foods Pepper. Peppermint and spearmint. Any condiments, herbs, or seasonings that cause symptoms. For some people, this may include curry, hot sauce, or vinegar-based salad dressings. Summary  When you have gastroesophageal reflux disease (GERD), food and lifestyle choices are very important to help ease the discomfort of GERD.  Eat frequent, small meals instead of three large meals each day. Eat your meals slowly, in a relaxed setting. Avoid bending over or lying down until 2-3 hours after eating.  Limit high-fat foods such as fatty meat or fried foods. This information is not intended to replace advice given to you by your health care provider. Make sure you discuss any questions you have with your health care provider. Document Revised: 04/02/2019 Document Reviewed: 12/11/2016 Elsevier Patient Education  [place], Adult     A hernia is the bulging of an organ or tissue through a weak spot in the muscles of the abdomen (abdominal wall). Hernias develop most often near the belly button (navel) or the area where the leg meets the lower abdomen (groin). Common types of hernias include:  Incisional hernia. This type bulges through a scar from an abdominal surgery.  Umbilical hernia. This type develops near the navel.  Inguinal hernia. This type develops in the groin or scrotum.  Femoral hernia. This type develops under the groin, in the upper thigh area.  Hiatal hernia. This type occurs when part of the stomach slides above the muscle that separates the abdomen from the chest (diaphragm). What are the causes? This condition may be caused by:  Heavy lifting.  Coughing over a long period of time.  Straining to have a bowel movement. Constipation can lead to straining.  An incision made during an abdominal surgery.  A physical problem that is present at birth (congenital defect).  Being overweight or obese.  Smoking.  Excess fluid in the  abdomen.  Undescended testicles in males. What are the signs or symptoms? The main symptom is a skin-colored, rounded bulge in the area of the hernia. However, a bulge may not always be present. It may grow bigger or be more visible when you cough or strain (such as when lifting something heavy). A hernia that can be pushed back into the area (is reducible) rarely causes pain. A hernia that cannot be pushed back into the area (is incarcerated) may lose its blood supply (become strangulated). A hernia that is incarcerated may cause:  Pain.  Fever.  Nausea and vomiting.  Swelling.  Constipation. How is this diagnosed? A hernia may be diagnosed based on:  Your symptoms and medical history.  A physical exam. Your health care provider may ask you to cough or move in certain ways to see if the hernia becomes visible.  Imaging tests, such as: ? X-rays. ? Ultrasound. ? CT scan. How is this treated? A hernia that is small and painless may not need to be treated. A hernia that is large or painful may be treated with surgery. Inguinal hernias may be treated with surgery to prevent incarceration or strangulation. Strangulated hernias are always treated with surgery because a lack of blood supply to the trapped organ or tissue can cause it  to die. Surgery to treat a hernia involves pushing the bulge back into place and repairing the weak area of the muscle or abdominal wall. Follow these instructions at home: Activity  Avoid straining.  Do not lift anything that is heavier than 10 lb (4.5 kg), or the limit that you are told, until your health care provider says that it is safe.  When lifting heavy objects, lift with your leg muscles, not your back muscles. Preventing constipation  Take actions to prevent constipation. Constipation leads to straining with bowel movements, which can make a hernia worse or cause a hernia repair to break down. Your health care provider may recommend that  you: ? Drink enough fluid to keep your urine pale yellow. ? Eat foods that are high in fiber, such as fresh fruits and vegetables, whole grains, and beans. ? Limit foods that are high in fat and processed sugars, such as fried or sweet foods. ? Take an over-the-counter or prescription medicine for constipation. General instructions  When coughing, try to cough gently.  You may try to push the hernia back in place by very gently pressing on it while lying down. Do not try to force the bulge back in if it will not push in easily.  If you are overweight, work with your health care provider to lose weight safely.  Do not use any products that contain nicotine or tobacco, such as cigarettes and e-cigarettes. If you need help quitting, ask your health care provider.  If you are scheduled for hernia repair, watch your hernia for any changes in shape, size, or color. Tell your health care provider about any changes or new symptoms.  Take over-the-counter and prescription medicines only as told by your health care provider.  Keep all follow-up visits as told by your health care provider. This is important. Contact a health care provider if:  You develop new pain, swelling, or redness around your hernia.  You have signs of constipation, such as: ? Fewer bowel movements in a week than normal. ? Difficulty having a bowel movement. ? Stools that are dry, hard, or larger than normal. Get help right away if:  You have a fever.  You have abdomen pain that gets worse.  You feel nauseous or you vomit.  You cannot push the hernia back in place by very gently pressing on it while lying down. Do not try to force the bulge back in if it will not push in easily.  The hernia: ? Changes in shape, size, or color. ? Feels hard or tender. These symptoms may represent a serious problem that is an emergency. Do not wait to see if the symptoms will go away. Get medical help right away. Call your local  emergency services (911 in the [place] Summary  A hernia is the bulging of an organ or tissue through a weak spot in the muscles of the abdomen (abdominal wall).  The main symptom is a skin-colored, rounded lump (bulge) in the hernia area. However, a bulge may not always be present. It may grow bigger or more visible when you cough or strain (such as when having a bowel movement).  A hernia that is small and painless may not need to be treated. A hernia that is large or painful may be treated with surgery.  Surgery to treat a hernia involves pushing the bulge back into place and repairing the weak part of the abdomen. This information is not intended to replace advice given to you by  your health care provider. Make sure you discuss any questions you have with your health care provider. Document Revised: 04/02/2019 Document Reviewed: 09/11/2017 Elsevier Patient Education  [place].

## 2020-09-03 NOTE — Plan of Care (Signed)
  Problem: Education: Goal: Required Educational Video(s) Outcome: Adequate for Discharge   Problem: Clinical Measurements: Goal: Postoperative complications will be avoided or minimized Outcome: Adequate for Discharge   Problem: Skin Integrity: Goal: Demonstration of wound healing without infection will improve Outcome: Adequate for Discharge   Problem: Health Behavior/Discharge Planning: Goal: Ability to manage health-related needs will improve Outcome: Adequate for Discharge   Problem: Clinical Measurements: Goal: Ability to maintain clinical measurements within normal limits will improve Outcome: Adequate for Discharge Goal: Will remain free from infection Outcome: Adequate for Discharge Goal: Diagnostic test results will improve Outcome: Adequate for Discharge Goal: Respiratory complications will improve Outcome: Adequate for Discharge Goal: Cardiovascular complication will be avoided Outcome: Adequate for Discharge   Problem: Activity: Goal: Risk for activity intolerance will decrease Outcome: Adequate for Discharge   Problem: Nutrition: Goal: Adequate nutrition will be maintained Outcome: Adequate for Discharge   Problem: Coping: Goal: Level of anxiety will decrease Outcome: Adequate for Discharge   Problem: Elimination: Goal: Will not experience complications related to bowel motility Outcome: Adequate for Discharge   Problem: Pain Managment: Goal: General experience of comfort will improve Outcome: Adequate for Discharge   Problem: Safety: Goal: Ability to remain free from injury will improve Outcome: Adequate for Discharge   Problem: Skin Integrity: Goal: Risk for impaired skin integrity will decrease Outcome: Adequate for Discharge

## 2020-09-05 ENCOUNTER — Other Ambulatory Visit: Payer: Self-pay

## 2020-09-05 NOTE — Patient Outreach (Signed)
[facility]) Care Management  09/05/2020  Yvonne Jordan Dec 18, 1946 [id number]   Telephone assessment/ red emmi:  Covering assigned case manager.  Recent discharge after hiatal hernia repair. Recent red emmi with out a response to outreach calls.   Placed call to patient with no answer. Left a generic message to call me back. Provided my contact phone number.   PLAN: will mail another unsuccessful outreach letter and attempt again in 3 days.  Tomasa Rand, RN, BSN, CEN [facility] [employer] [phone]

## 2020-09-06 DIAGNOSIS — J849 Interstitial pulmonary disease, unspecified: Secondary | ICD-10-CM | POA: Diagnosis not present

## 2020-09-06 DIAGNOSIS — N189 Chronic kidney disease, unspecified: Secondary | ICD-10-CM | POA: Diagnosis not present

## 2020-09-06 DIAGNOSIS — K219 Gastro-esophageal reflux disease without esophagitis: Secondary | ICD-10-CM | POA: Diagnosis not present

## 2020-09-06 DIAGNOSIS — J309 Allergic rhinitis, unspecified: Secondary | ICD-10-CM | POA: Diagnosis not present

## 2020-09-06 DIAGNOSIS — Z48813 Encounter for surgical aftercare following surgery on the respiratory system: Secondary | ICD-10-CM | POA: Diagnosis not present

## 2020-09-06 DIAGNOSIS — R911 Solitary pulmonary nodule: Secondary | ICD-10-CM | POA: Diagnosis not present

## 2020-09-08 ENCOUNTER — Ambulatory Visit: Payer: Medicare Other

## 2020-09-09 ENCOUNTER — Ambulatory Visit (INDEPENDENT_AMBULATORY_CARE_PROVIDER_SITE_OTHER): Payer: Self-pay | Admitting: Thoracic Surgery (Cardiothoracic Vascular Surgery)

## 2020-09-09 ENCOUNTER — Other Ambulatory Visit: Payer: Self-pay

## 2020-09-09 ENCOUNTER — Encounter: Payer: Self-pay | Admitting: Thoracic Surgery (Cardiothoracic Vascular Surgery)

## 2020-09-09 VITALS — BP 126/77 | HR 110 | Temp 97.7°F | Resp 20 | Ht 65.0 in | Wt 176.0 lb

## 2020-09-09 DIAGNOSIS — Z09 Encounter for follow-up examination after completed treatment for conditions other than malignant neoplasm: Secondary | ICD-10-CM

## 2020-09-09 DIAGNOSIS — K449 Diaphragmatic hernia without obstruction or gangrene: Secondary | ICD-10-CM

## 2020-09-09 NOTE — Patient Outreach (Signed)
[facility]) Care Management  09/09/2020  Yvonne Jordan [birth year] [id number]   Telephone assessment: Attempt to reach patient unsuccessful.  No message left.  PLAN: will update [facility] assigned case manager for next outreach in 3-4 business days.   Tomasa Rand, RN, BSN, CEN [facility] [employer] [phone]

## 2020-09-09 NOTE — Progress Notes (Signed)
      [address]       [place] [address]             [place] Record #[id number] Date of Birth: 04/22/1946  Referring: Buford Dresser,* Primary Care: Antony Contras, MD Primary Cardiologist:Bridgette Kinjal Neitzke Gave, MD  Reason for visit:   follow-up  History of Present Illness:     Mrs. Schwanke comes in for her first follow-up appointment.  Overall she is doing well.  She has been off of her antireflux medication for the past 48 hours and denies any reflux symptoms.  She is tolerating a full liquid diet without any dysphagia or odontophagia.  She does remain short of breath but has not been using her incentive spirometer.  Physical Exam: BP 126/77   Pulse (!) 110   Temp 97.7 F (36.5 C) (Skin)   Resp 20   Ht 5\' 5"  (1.651 m)   Wt 176 lb (79.8 kg)   SpO2 98% Comment: RA  BMI 29.29 kg/m   Alert NAD Incision clean.   Abdomen soft, ND No peripheral edema   Diagnostic Studies & Laboratory data:  Path: A. HERNIA SAC, HIATAL:  - Fibrovascular and adipose tissue consistent with hernia sac  - Focus of benign lymphoid tissue    Assessment / Plan:   74 year old female status post robotic assisted paraesophageal hernia repair, Nissen fundoplication and gastropexy.  We performed this procedure mostly because of the concern for silent aspiration leading to her respiratory symptoms.  Overall she is doing well.  She is cleared to advance her diet, and will follow-up in 1 month with a chest x-ray.   Kenzlie Disch O Benna Arno 09/09/2020 2:00 PM

## 2020-09-14 ENCOUNTER — Other Ambulatory Visit: Payer: Self-pay | Admitting: *Deleted

## 2020-09-14 ENCOUNTER — Encounter: Payer: Self-pay | Admitting: *Deleted

## 2020-09-14 NOTE — Patient Outreach (Signed)
[facility]) Care Management [facility] CM Telephone Outreach, case closure  09/14/2020  [name] 1946/05/14 [id number]  Unsuccessful(consecutive) sixthtelephone outreachattemptto Yvonne Jordan, 74 y/o female referred to [place] 07/28/20 by [facility] CMA for [place] notification as above; patient had recent elective surgery/ hospitalization July 29-30, 201for RAT with wedge resection of (R) upper/ middle lobe; she was discharged home with home health services through [place] services. Patient has history including, but not limited to, HLD; ILD; CKD; GERD.  Patient also had planned (elective) surgery September 9-11, 2021 for hernia repair and was discharged home to self-care without home health services in place.  HIPAA compliant voice mail message left for patient, requesting return call back  Plan:  Verified[facility] CM unsuccessful patient outreach letter in mail requesting call back in writing on 08/02/20 and then again on 09/05/20, without response back form patient  Will make patie's PCPnt inactive with [facility] CM and make patient's PCP aware of same.  Oneta Rack, RN, BSN, [employer] [facility] Care Management  [phone]

## 2020-09-30 ENCOUNTER — Telehealth: Payer: Self-pay | Admitting: *Deleted

## 2020-09-30 NOTE — Telephone Encounter (Signed)
Pt called stating that crushing her daily multi-vitamin gives her diarrhea. She is wanting to go back to taking the gummy form of multi-vitamin. Pt states she is tolerating PO intake without any reflux. Per Dr. Abran Duke 1 week p/o note pt is cleared to advance her diet. Advised pt that she can consume the gummy form of vitamin as long as she chews it up well and she feels as if she can take it. All questions answered.

## 2020-10-10 DIAGNOSIS — Z853 Personal history of malignant neoplasm of breast: Secondary | ICD-10-CM | POA: Diagnosis not present

## 2020-10-10 DIAGNOSIS — J309 Allergic rhinitis, unspecified: Secondary | ICD-10-CM | POA: Diagnosis not present

## 2020-10-10 DIAGNOSIS — R5383 Other fatigue: Secondary | ICD-10-CM | POA: Diagnosis not present

## 2020-10-10 DIAGNOSIS — J679 Hypersensitivity pneumonitis due to unspecified organic dust: Secondary | ICD-10-CM | POA: Diagnosis not present

## 2020-10-10 DIAGNOSIS — K219 Gastro-esophageal reflux disease without esophagitis: Secondary | ICD-10-CM | POA: Diagnosis not present

## 2020-10-10 DIAGNOSIS — J439 Emphysema, unspecified: Secondary | ICD-10-CM | POA: Diagnosis not present

## 2020-10-10 DIAGNOSIS — Z8673 Personal history of transient ischemic attack (TIA), and cerebral infarction without residual deficits: Secondary | ICD-10-CM | POA: Diagnosis not present

## 2020-10-10 DIAGNOSIS — E7800 Pure hypercholesterolemia, unspecified: Secondary | ICD-10-CM | POA: Diagnosis not present

## 2020-10-10 DIAGNOSIS — N1831 Chronic kidney disease, stage 3a: Secondary | ICD-10-CM | POA: Diagnosis not present

## 2020-10-10 DIAGNOSIS — E559 Vitamin D deficiency, unspecified: Secondary | ICD-10-CM | POA: Diagnosis not present

## 2020-10-10 DIAGNOSIS — M85851 Other specified disorders of bone density and structure, right thigh: Secondary | ICD-10-CM | POA: Diagnosis not present

## 2020-10-10 DIAGNOSIS — Z23 Encounter for immunization: Secondary | ICD-10-CM | POA: Diagnosis not present

## 2020-10-18 ENCOUNTER — Ambulatory Visit: Payer: Medicare Other | Admitting: Podiatry

## 2020-10-20 ENCOUNTER — Other Ambulatory Visit: Payer: Self-pay | Admitting: Thoracic Surgery (Cardiothoracic Vascular Surgery)

## 2020-10-20 DIAGNOSIS — Z9889 Other specified postprocedural states: Secondary | ICD-10-CM

## 2020-10-20 DIAGNOSIS — Z8719 Personal history of other diseases of the digestive system: Secondary | ICD-10-CM

## 2020-10-21 ENCOUNTER — Ambulatory Visit (INDEPENDENT_AMBULATORY_CARE_PROVIDER_SITE_OTHER): Payer: Self-pay | Admitting: Thoracic Surgery (Cardiothoracic Vascular Surgery)

## 2020-10-21 ENCOUNTER — Encounter: Payer: Self-pay | Admitting: Thoracic Surgery (Cardiothoracic Vascular Surgery)

## 2020-10-21 ENCOUNTER — Other Ambulatory Visit: Payer: Self-pay

## 2020-10-21 ENCOUNTER — Ambulatory Visit
Admission: RE | Admit: 2020-10-21 | Discharge: 2020-10-21 | Disposition: A | Discharge status: Home / Self Care | Payer: Medicare Other | Source: Ambulatory Visit | Attending: Thoracic Surgery (Cardiothoracic Vascular Surgery) | Admitting: Thoracic Surgery (Cardiothoracic Vascular Surgery)

## 2020-10-21 VITALS — BP 140/84 | HR 105 | Temp 97.3°F | Resp 20 | Ht 65.0 in | Wt 175.0 lb

## 2020-10-21 DIAGNOSIS — M47814 Spondylosis without myelopathy or radiculopathy, thoracic region: Secondary | ICD-10-CM | POA: Diagnosis not present

## 2020-10-21 DIAGNOSIS — Z9889 Other specified postprocedural states: Secondary | ICD-10-CM

## 2020-10-21 DIAGNOSIS — Z8719 Personal history of other diseases of the digestive system: Secondary | ICD-10-CM

## 2020-10-21 DIAGNOSIS — J9811 Atelectasis: Secondary | ICD-10-CM | POA: Diagnosis not present

## 2020-10-21 IMAGING — DX DG CHEST 2V
2 series · 2 of 2 positions shown · non-contrast
Comparison: [DATE]

CLINICAL DATA: Status post repair para esophageal hernia.

EXAM:
CHEST - 2 VIEW

[dg chest 2 view (1 of 2)]
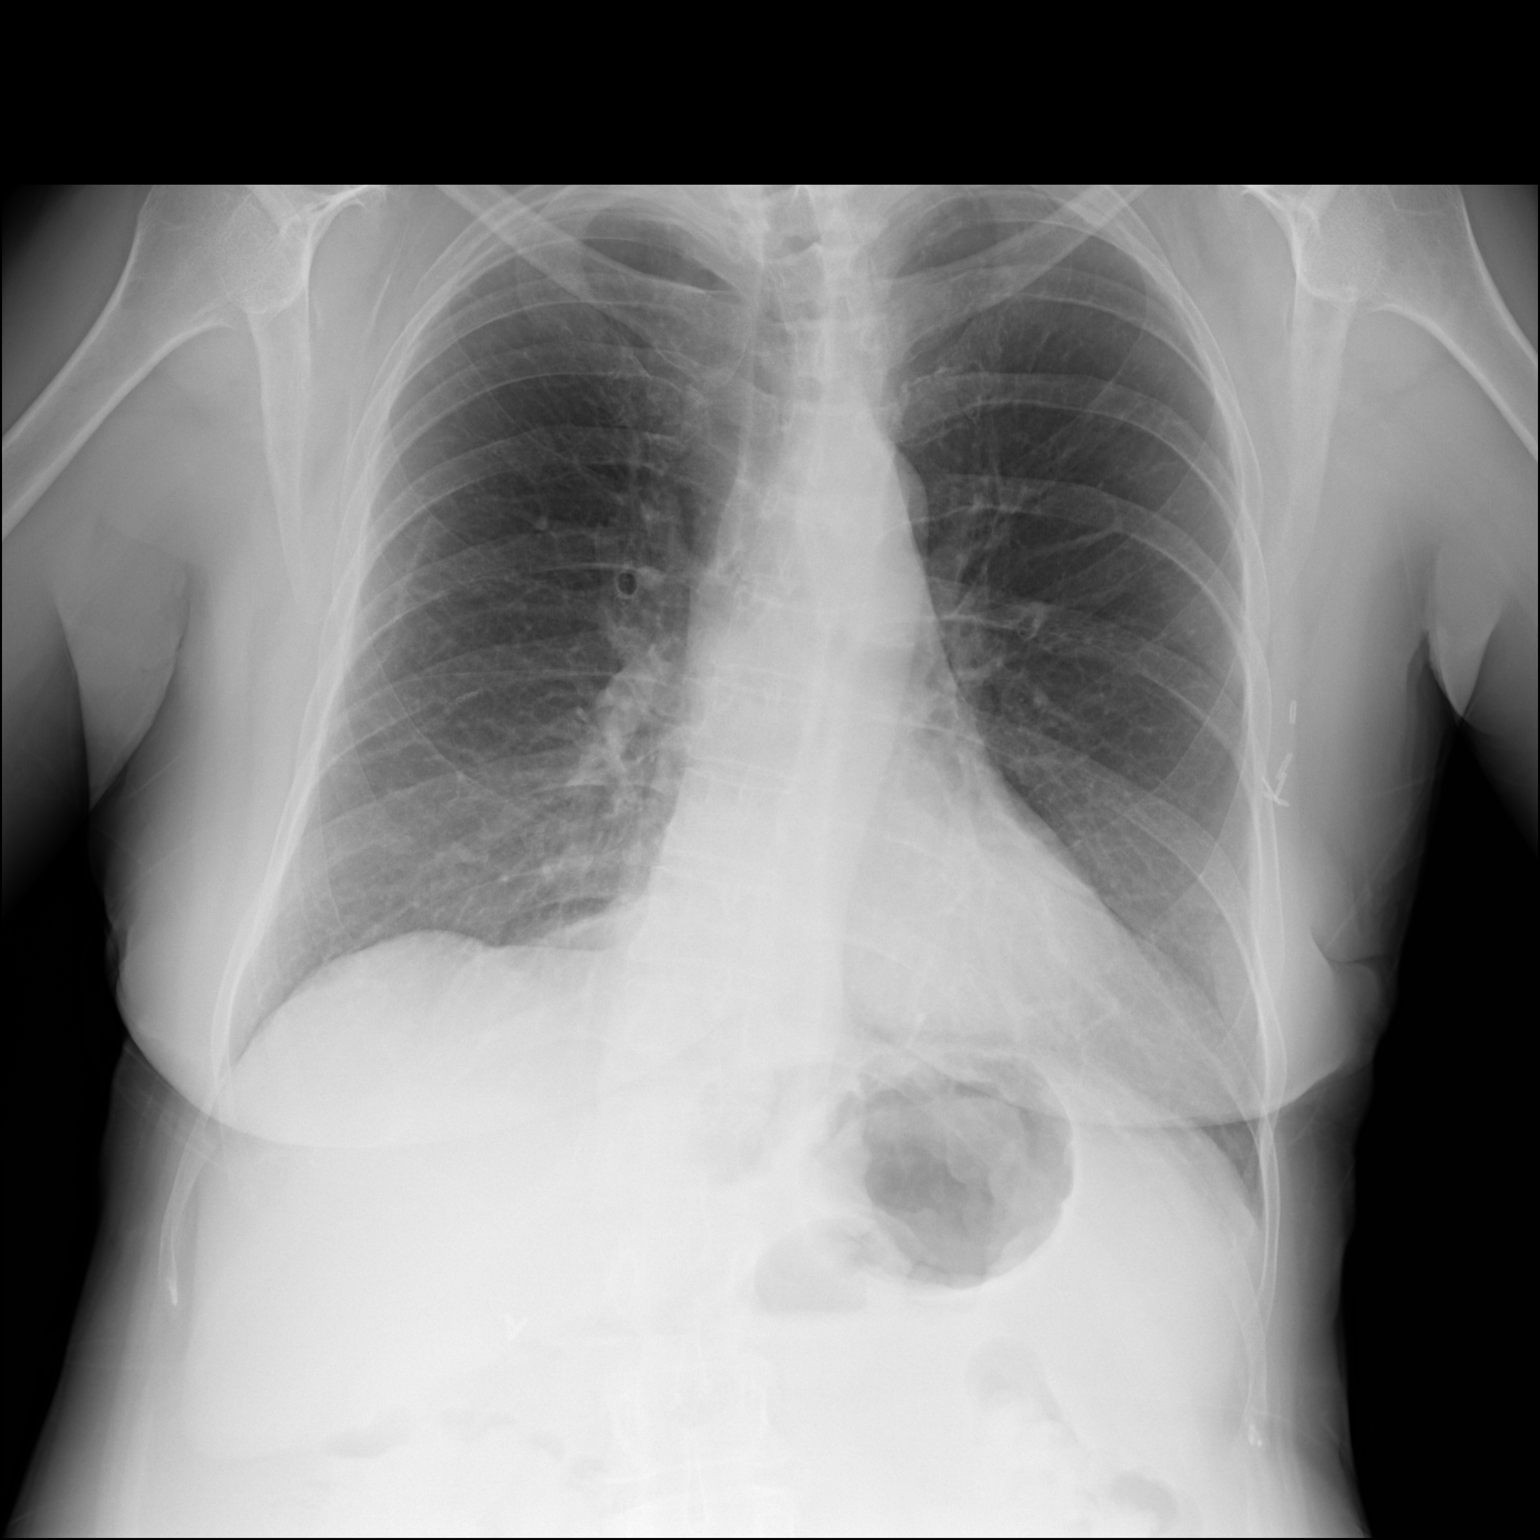

[dg chest 2 view (2 of 2)]
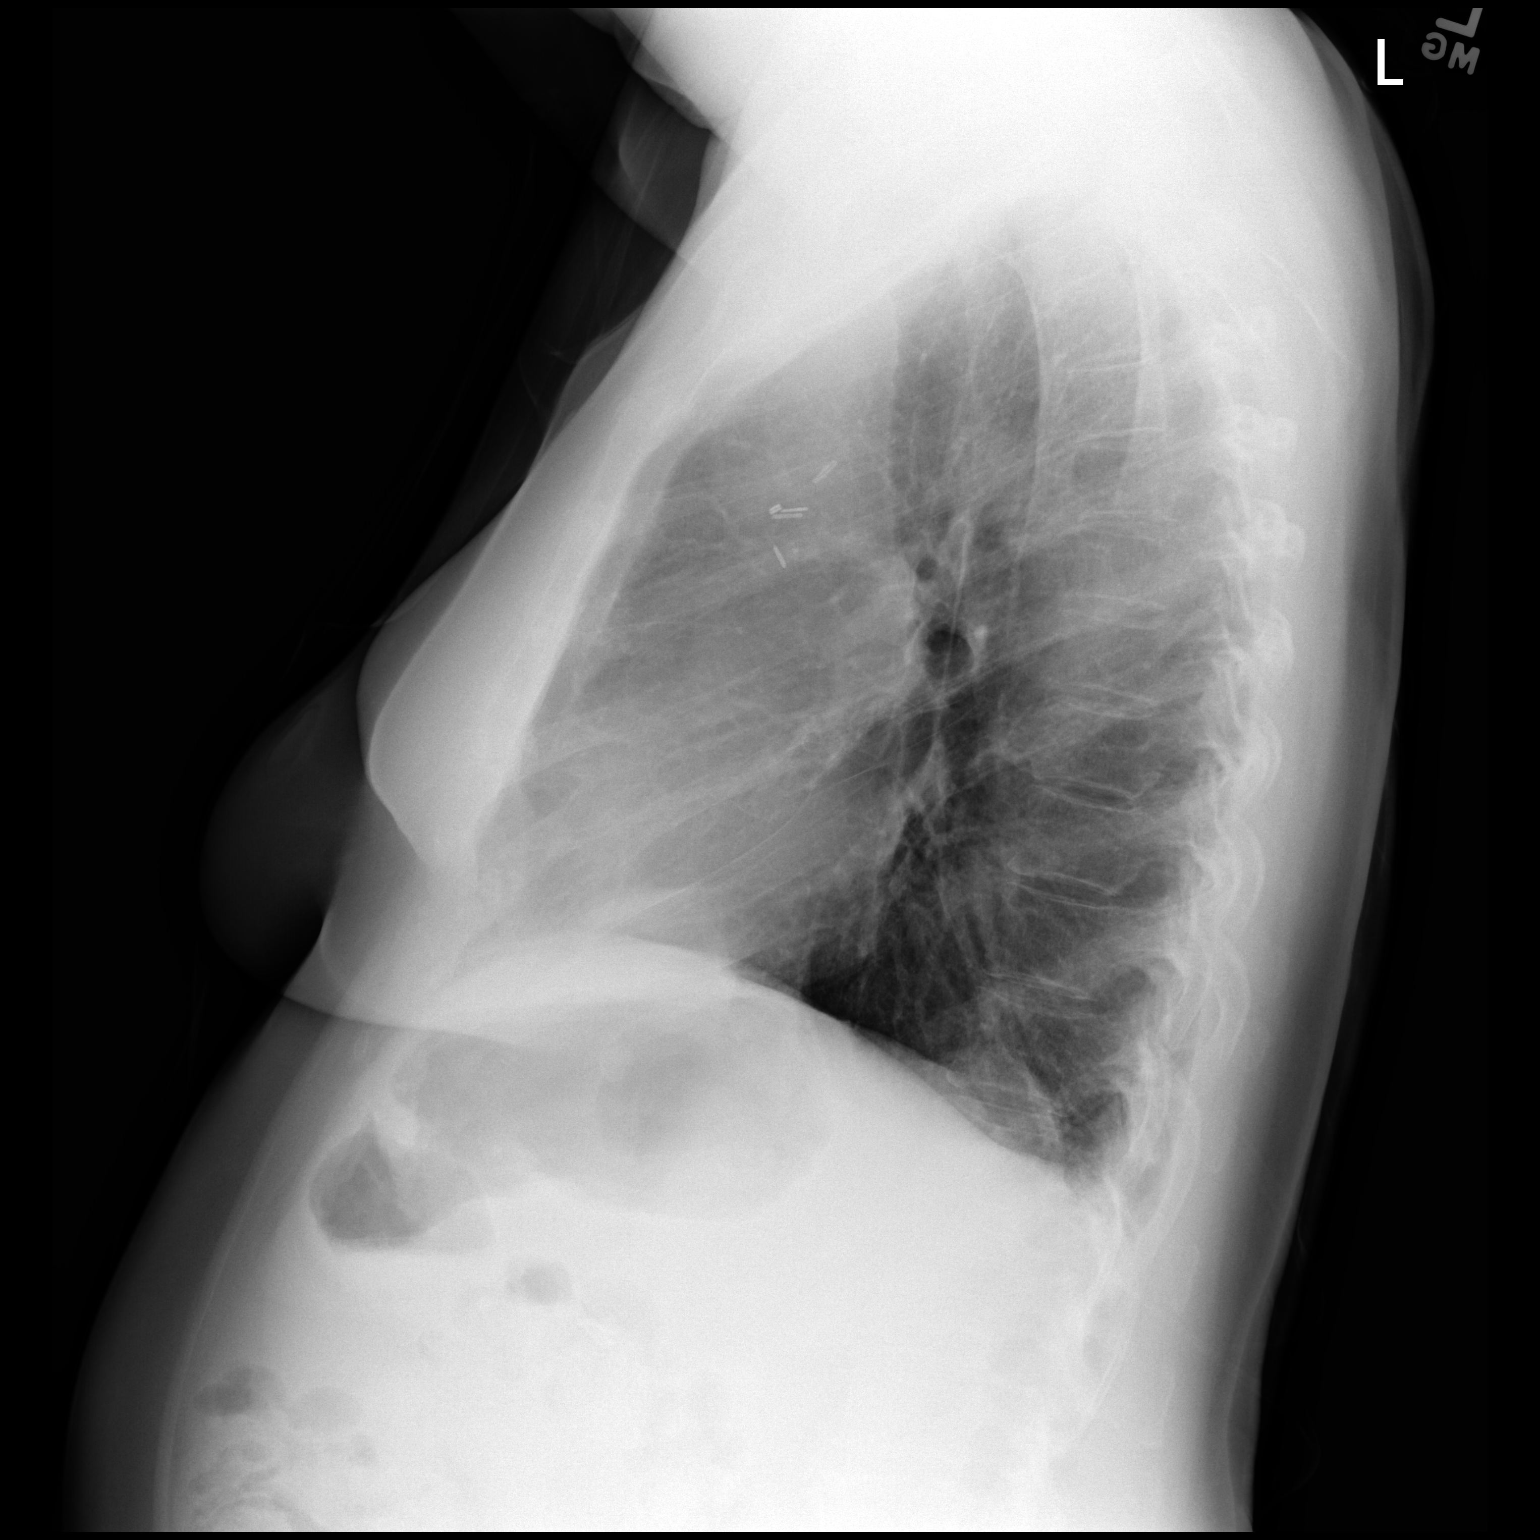

[2 of 2 positions shown; findings below may reference images not displayed]

FINDINGS: Mediastinum and hilar structures normal. Heart size normal. Mild
right mid lung and right base subsegmental atelectasis. No pleural
effusion or pneumothorax. Surgical clips left axilla. Degenerative
change thoracic spine.
IMPRESSION: Mild right mid lung and right base subsegmental atelectasis. Exam
otherwise unremarkable.

## 2020-10-21 NOTE — Progress Notes (Signed)
      [address]       [place] [address]             [place] Record #[id number] Date of Birth: 1946/02/18  Referring: Buford Dresser,* Primary Care: Antony Contras, MD Primary Cardiologist:Bridgette Averiana Clouatre Gave, MD  Reason for visit:   follow-up  History of Present Illness:     Mrs. Headings comes in for 1 month appointment after undergoing a robotic assisted paraesophageal hernia repair.  She denies any dysphagia or diet aphasia.  She also denies any reflux.  For some reason her primary doctor put her back on her antireflux medication despite not having any symptoms.  Her respiratory status is essentially unchanged but she states that she is not doing much.  She took a trip to [facility] and [facility] but laid in bed the entire time.  Physical Exam: BP 140/84 (BP Location: Right Arm, Patient Position: Sitting)   Pulse (!) 105   Temp (!) 97.3 F (36.3 C)   Resp 20   Ht 5\' 5"  (1.651 m)   Wt 175 lb (79.4 kg)   SpO2 97% Comment: RA with mask on  BMI 29.12 kg/m   Alert NAD Incision clean.   Abdomen soft, ND No peripheral edema   Diagnostic Studies & Laboratory data: CXR: Clear     Assessment / Plan:   75 year old female status post robotic assisted paraesophageal hernia repair with Nissen fundoplication and gastropexy.  Currently doing well She will have a virtual visit to assess her symptoms in 6 months.   Lajuana Matte 10/21/2020 10:17 AM

## 2020-10-31 DIAGNOSIS — Z23 Encounter for immunization: Secondary | ICD-10-CM | POA: Diagnosis not present

## 2021-02-08 DIAGNOSIS — S4991XA Unspecified injury of right shoulder and upper arm, initial encounter: Secondary | ICD-10-CM | POA: Diagnosis not present

## 2021-02-08 DIAGNOSIS — W19XXXA Unspecified fall, initial encounter: Secondary | ICD-10-CM | POA: Diagnosis not present

## 2021-02-08 DIAGNOSIS — S8992XA Unspecified injury of left lower leg, initial encounter: Secondary | ICD-10-CM | POA: Diagnosis not present

## 2021-03-07 ENCOUNTER — Telehealth: Payer: Self-pay | Admitting: Pulmonary Disease

## 2021-03-07 NOTE — Telephone Encounter (Signed)
Called and spoke with pt who stated she had surgery 09/01/20 for hiatal hernia repair. Pt said after the surgery, she started having diarrhea and has had it since. Pt said usually she has diarrhea in the morning after she eats breakfast.  Pt said that about 3-4 weeks after the surgery she began to lose weight. Pt said she has now lost a total of 30 pounds since September 2021.  Stated to pt since this started after her surgery that she needed to contact Dr. Abran Duke office since he was the surgeon to get recommendations. Stated to her after speaking with his office if she needed to call us back based off of the recommendations that he provided for her that she could call our office back. Pt verbalized understanding. Nothing further needed.

## 2021-03-17 DIAGNOSIS — R197 Diarrhea, unspecified: Secondary | ICD-10-CM | POA: Diagnosis not present

## 2021-03-17 DIAGNOSIS — R634 Abnormal weight loss: Secondary | ICD-10-CM | POA: Diagnosis not present

## 2021-03-20 ENCOUNTER — Encounter: Payer: Self-pay | Admitting: Cardiology

## 2021-03-20 ENCOUNTER — Ambulatory Visit (INDEPENDENT_AMBULATORY_CARE_PROVIDER_SITE_OTHER): Payer: Medicare Other | Admitting: Cardiology

## 2021-03-20 ENCOUNTER — Other Ambulatory Visit: Payer: Self-pay

## 2021-03-20 VITALS — BP 130/77 | HR 63 | Ht 65.0 in | Wt 161.2 lb

## 2021-03-20 DIAGNOSIS — R002 Palpitations: Secondary | ICD-10-CM

## 2021-03-20 DIAGNOSIS — R0602 Shortness of breath: Secondary | ICD-10-CM | POA: Diagnosis not present

## 2021-03-20 DIAGNOSIS — G459 Transient cerebral ischemic attack, unspecified: Secondary | ICD-10-CM

## 2021-03-20 DIAGNOSIS — Z7189 Other specified counseling: Secondary | ICD-10-CM

## 2021-03-20 NOTE — Progress Notes (Signed)
Cardiology Office Note:    Date:  03/20/2021   ID:  Yvonne Jordan, DOB January 21, 1946, MRN [id number]  PCP:  Yvonne Contras, MD  Cardiologist:  Yvonne Dresser, MD  Referring MD: Yvonne Contras, MD   CC: follow up  History of Present Illness:    Yvonne Jordan is a 75 y.o. female with a hx of TIA on statin and clopidogrel, hyperlipidemia who is for follow up. I initially saw her 02/2019 as a new consult at the request of Yvonne Contras, MD for the evaluation and management of chest pain and dyspnea.  Family history: sister died of MI in her 43s, brother had MI, mother had MI, many members with strokes (mom, dad both).   Today:  three episodes in the last three months where she had heart pounding. Tried to lay down, wait for it to stop, but lasted a least a few minutes. This was new for her. We used [employer] in office today to show her how to monitor.  No chest pain. Rare shortness of breath. Had hiatal hernia surgery 08/2020, lost >30 lbs, being evaluated by GI physician tomorrow.  Denies chest pain, shortness of breath at rest. No PND, orthopnea, LE edema or unexpected weight gain (weight loss as above). No syncope, rare palpitations as above.  Past Medical History:  Diagnosis Date  . Allergic rhinitis   . Arthritis   . Cancer ([place])    Breast- left  . CKD (chronic kidney disease)    stage III  . Colon polyp   . Dyspnea   . GERD (gastroesophageal reflux disease)   . History of hiatal hernia   . Hyperlipidemia   . Personal history of radiation therapy   . Stroke [facility])    TIA before 2013  . TIA (transient ischemic attack)   . Vitamin D deficiency     Past Surgical History:  Procedure Laterality Date  . APPENDECTOMY    . BREAST LUMPECTOMY Left 2006  . BREAST SURGERY     2006...treated with Tamoxifen for 6 years  . CHOLECYSTECTOMY    . COLONOSCOPY    . ESOPHAGOGASTRODUODENOSCOPY N/A 09/01/2020   Procedure: ESOPHAGOGASTRODUODENOSCOPY (EGD);  Surgeon:  Yvonne Matte, MD;  Location: [facility];  Service: Thoracic;  Laterality: N/A;  . XI ROBOTIC ASSISTED PARAESOPHAGEAL HERNIA REPAIR N/A 09/01/2020   Procedure: XI ROBOTIC ASSISTED HIATAL HERNIA REPAIR WITH FUNDOPLICATION AND GASTROPEXY;  Surgeon: Yvonne Matte, MD;  Location: [facility] [facility];  Service: Thoracic;  Laterality: N/A;    Current Medications: Current Outpatient Medications on File Prior to Visit  Medication Sig  . acetaminophen (TYLENOL) 325 MG tablet Take 650 mg by mouth every 6 (six) hours as needed for moderate pain.   Marland Kitchen atorvastatin (LIPITOR) 20 MG tablet Take 20 mg by mouth daily.   . clopidogrel (PLAVIX) 75 MG tablet Take 75 mg by mouth daily with breakfast.  . diclofenac Sodium (VOLTAREN) 1 % GEL Voltaren 1 % topical gel  APPLY 2 GRAMS TO THE AFFECTED AREA(S) BY TOPICAL ROUTE 4 TIMES PER DAY  . ondansetron (ZOFRAN-ODT) 4 MG disintegrating tablet Take 1 tablet (4 mg total) by mouth every 8 (eight) hours as needed for nausea or vomiting.  . pantoprazole (PROTONIX) 40 MG tablet Take 1 tablet (40 mg total) by mouth 2 (two) times daily before a meal. For 30 days  . Polyethyl Glycol-Propyl Glycol (SYSTANE OP) Place 1 drop into both eyes 2 (two) times daily as needed (dry eyes).   . tretinoin (RETIN-A)  0.025 % cream Apply 1 application topically at bedtime.    No current facility-administered medications on file prior to visit.     Allergies:   Patient has no known allergies.   Social History   Tobacco Use  . Smoking status: Never Smoker  . Smokeless tobacco: Never Used  Vaping Use  . Vaping Use: Never used  Substance Use Topics  . Alcohol use: Yes    Alcohol/week: 0.0 standard drinks    Comment: 1 drink -every few weeks  . Drug use: No    Family History: The patient's family history includes Breast cancer in her sister; Deep vein thrombosis in her mother; Heart attack in her sister; Heart disease in her father, maternal uncle, maternal uncle, and maternal uncle; Stroke  in her father and mother.  ROS:   Please see the history of present illness.  Additional pertinent ROS otherwise unremarkable.  EKGs/Labs/Other Studies Reviewed:    The following studies were reviewed today: Prior notes and stress test results  EKG:  EKG is personally reviewed.  The ekg ordered today demonstrates normal sinus rhythm at 63 bpm  Recent Labs: 08/30/2020: ALT 18 09/02/2020: Hemoglobin 9.6; Platelets 198 09/03/2020: BUN 14; Creatinine, Ser 1.06; Potassium 3.8; Sodium 139  Recent Lipid Panel No results found for: CHOL, TRIG, HDL, CHOLHDL, VLDL, LDLCALC, LDLDIRECT  Physical Exam:    VS:  BP 130/77   Pulse 63   Ht 5\' 5"  (1.651 m)   Wt 161 lb 3.2 oz (73.1 kg)   SpO2 99%   BMI 26.83 kg/m     Wt Readings from Last 3 Encounters:  03/20/21 161 lb 3.2 oz (73.1 kg)  10/21/20 175 lb (79.4 kg)  09/09/20 176 lb (79.8 kg)    GEN: Well nourished, well developed in no acute distress HEENT: Normal, moist mucous membranes NECK: No JVD CARDIAC: regular rhythm, normal S1 and S2, no rubs or gallops. No murmur. VASCULAR: Radial and DP pulses 2+ bilaterally. No carotid bruits RESPIRATORY:  Clear to auscultation without rales, wheezing or rhonchi  ABDOMEN: Soft, non-tender, non-distended MUSCULOSKELETAL:  Ambulates independently SKIN: Warm and dry, no edema NEUROLOGIC:  Alert and oriented x 3. No focal neuro deficits noted. PSYCHIATRIC:  Normal affect   ASSESSMENT:    1. Palpitation   2. TIA (transient ischemic attack)   3. Shortness of breath   4. Cardiac risk counseling   5. Counseling on health promotion and disease prevention    PLAN:    Chest pain: resolved -coronary CTA without CAD, calcium score 0  Shortness of breath: chronic, stable  Palpitations: about once a month -reviewed [place] today -low utility to a monitor given current frequency of symptoms, but if becomes more frequent she will contact me and we can do monitor at that time -ECG normal  today  History of TIA: on clopidogrel and statin. Continue  Cardiac risk counseling and prevention recommendations: -recommend heart healthy/Mediterranean diet, with whole grains, fruits, vegetable, fish, lean meats, nuts, and olive oil. Limit salt.  -recommend moderate walking, 3-5 times/week for 30-50 minutes each session. Aim for at least 150 minutes.week. Goal should be pace of 3 miles/hours, or walking 1.5 miles in 30 minutes -recommend avoidance of tobacco products. Avoid excess alcohol.  Plan for follow up: 1 year or sooner as needed  Medication Adjustments/Labs and Tests Ordered: Current medicines are reviewed at length with the patient today.  Concerns regarding medicines are outlined above.  Orders Placed This Encounter  Procedures  . EKG 12-Lead  No orders of the defined types were placed in this encounter.   Patient Instructions  Medication Instructions:  Your Physician recommend you continue on your current medication as directed.    *If you need a refill on your cardiac medications before your next appointment, please call your pharmacy*   Lab Work: None   Testing/Procedures: None   Follow-Up: At [facility], you and your health needs are our priority.  As part of our continuing mission to provide you with exceptional heart care, we have created designated Provider Care Teams.  These Care Teams include your primary Cardiologist (physician) and Advanced Practice Providers (APPs -  Physician Assistants and Nurse Practitioners) who all work together to provide you with the care you need, when you need it.  We recommend signing up for the patient portal called "MyChart".  Sign up information is provided on this After Visit Summary.  MyChart is used to connect with patients for Virtual Visits (Telemedicine).  Patients are able to view lab/test results, encounter notes, upcoming appointments, etc.  Non-urgent messages can be sent to your provider as well.   To learn  more about what you can do with MyChart, go to [email or web address].    Your next appointment:   1 year(s) @ [address] [place], [place] [address]  The format for your next appointment:   In Person  Provider:   Buford Dresser, MD   Consider [facility] to track your intermittent episodes of hard heartbeats.    [employer]: Website: [email or web address]  DR. Harrell Gave RECOMMENDS YOU PURCHASE  " [employer]" [employer]  [employer] FROM THE  GOOGLE/ITUN[employer]  APP PLAY STORE.  THE APP IS FREE , BUT THE  EQUIPMENT HAS A COST. IT ALLOWS YOU TO OBTAIN A RECORDING OF YOUR HEART RATE AND RHYTHM BY PROVIDING A SHORT STRIP THAT YOU CAN SHARE WITH YOUR PROVIDER.       Signed, Yvonne Dresser, MD PhD 03/20/2021 5:39 PM    [place]

## 2021-03-20 NOTE — Patient Instructions (Addendum)
Medication Instructions:  Your Physician recommend you continue on your current medication as directed.    *If you need a refill on your cardiac medications before your next appointment, please call your pharmacy*   Lab Work: None   Testing/Procedures: None   Follow-Up: At [facility], you and your health needs are our priority.  As part of our continuing mission to provide you with exceptional heart care, we have created designated Provider Care Teams.  These Care Teams include your primary Cardiologist (physician) and Advanced Practice Providers (APPs -  Physician Assistants and Nurse Practitioners) who all work together to provide you with the care you need, when you need it.  We recommend signing up for the patient portal called "MyChart".  Sign up information is provided on this After Visit Summary.  MyChart is used to connect with patients for Virtual Visits (Telemedicine).  Patients are able to view lab/test results, encounter notes, upcoming appointments, etc.  Non-urgent messages can be sent to your provider as well.   To learn more about what you can do with MyChart, go to [email or web address].    Your next appointment:   1 year(s) @ [address] [place], [place] [address]  The format for your next appointment:   In Person  Provider:   Buford Dresser, MD   Consider [facility] to track your intermittent episodes of hard heartbeats.    [employer]: Website: [email or web address]  DR. Harrell Gave RECOMMENDS YOU PURCHASE  " K[employer]ia" [employer]  [employer] FROM THE  GOOGLE/[employer]  APP PLAY STORE.  THE APP IS FREE , BUT THE  EQUIPMENT HAS A COST. IT ALLOWS YOU TO OBTAIN A RECORDING OF YOUR HEART RATE AND RHYTHM BY PROVIDING A SHORT STRIP THAT YOU CAN SHARE WITH YOUR PROVIDER.

## 2021-03-21 ENCOUNTER — Other Ambulatory Visit: Payer: Self-pay | Admitting: Gastroenterology

## 2021-03-21 DIAGNOSIS — R197 Diarrhea, unspecified: Secondary | ICD-10-CM | POA: Diagnosis not present

## 2021-03-21 DIAGNOSIS — R109 Unspecified abdominal pain: Secondary | ICD-10-CM

## 2021-03-21 DIAGNOSIS — R1310 Dysphagia, unspecified: Secondary | ICD-10-CM

## 2021-03-21 DIAGNOSIS — R6881 Early satiety: Secondary | ICD-10-CM

## 2021-03-22 DIAGNOSIS — R197 Diarrhea, unspecified: Secondary | ICD-10-CM | POA: Diagnosis not present

## 2021-03-24 ENCOUNTER — Ambulatory Visit
Admission: RE | Admit: 2021-03-24 | Discharge: 2021-03-24 | Disposition: A | Discharge status: Home / Self Care | Payer: Medicare Other | Source: Ambulatory Visit | Attending: Gastroenterology | Admitting: Gastroenterology

## 2021-03-24 DIAGNOSIS — K449 Diaphragmatic hernia without obstruction or gangrene: Secondary | ICD-10-CM | POA: Diagnosis not present

## 2021-03-24 DIAGNOSIS — R109 Unspecified abdominal pain: Secondary | ICD-10-CM

## 2021-03-24 DIAGNOSIS — R6881 Early satiety: Secondary | ICD-10-CM

## 2021-03-24 DIAGNOSIS — R1310 Dysphagia, unspecified: Secondary | ICD-10-CM

## 2021-03-24 IMAGING — RF DG UGI W/ HIGH DENSITY W/O KUB
7 series · 14 of 24 positions shown · non-contrast
Comparison: [DATE] esophagram.  [DATE] chest CT.

CLINICAL DATA: History of robotic paraesophageal hernia repair
[DATE] with fundoplication and gastropexy. Patient reports upper
abdominal discomfort, dysphagia and early satiety.

EXAM:
UPPER GI SERIES WITH KUB
TECHNIQUE: After obtaining a scout radiograph a routine upper GI series was
performed using thin and high density barium.
FLUOROSCOPY TIME:  Fluoroscopy Time:  3 minutes 30 seconds
Radiation Exposure Index (if provided by the fluoroscopic device):
1,135 mGy
Number of Acquired Spot Images: 9

[Series 1: one shot · 0.14mm/px · 5 of 11 slices shown (1 of 3)]
[im 1/11]
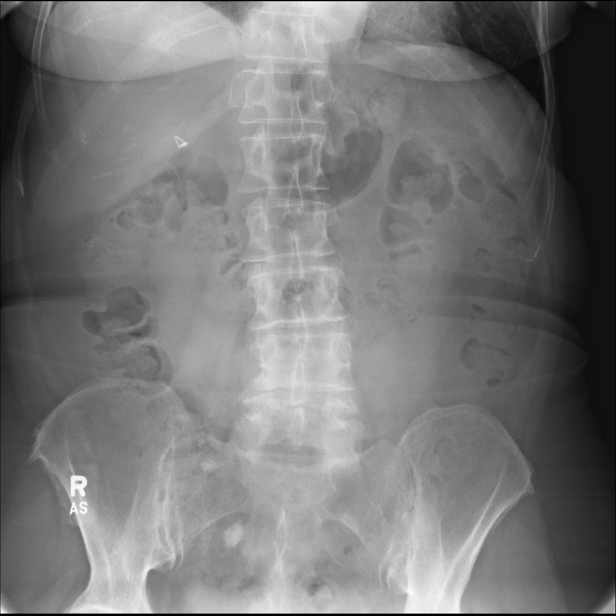
[im 4/11]
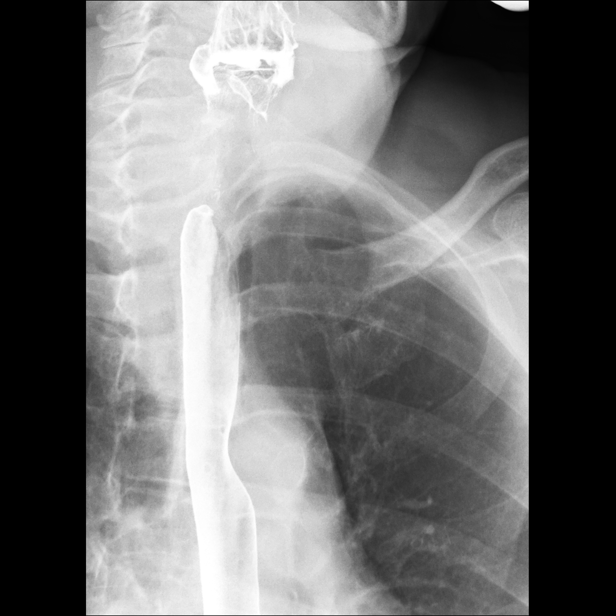
[im 7/11]
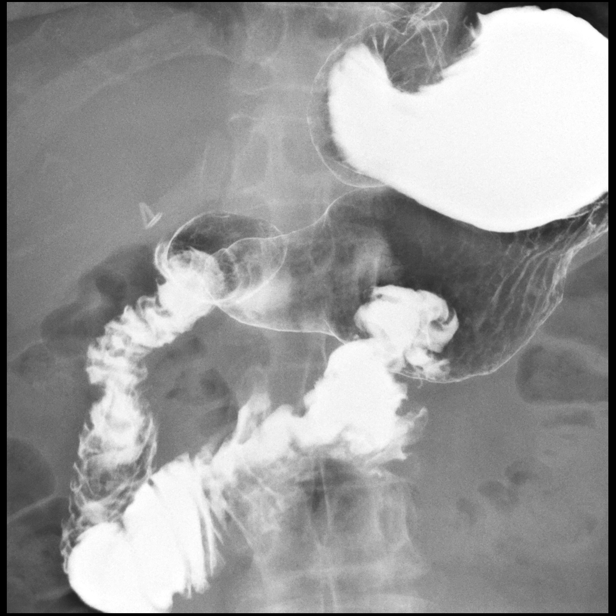
[im 9/11]
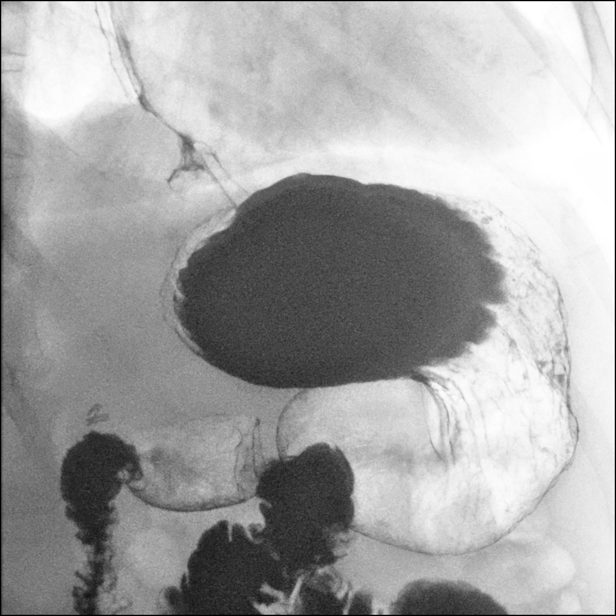
[im 11/11]
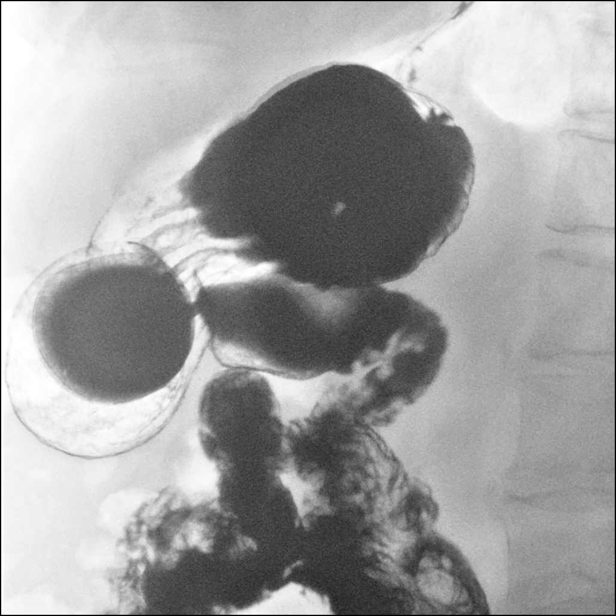

[Series 2: sequence · 1 of 46 frames shown (1 of 4)]
[frame 24/46]
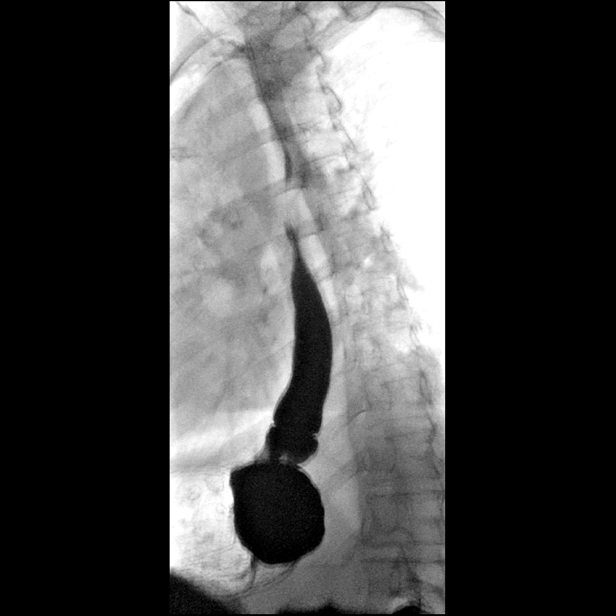

[Series 3: sequence · 2 of 118 frames shown (2 of 4)]
[frame 18/118]
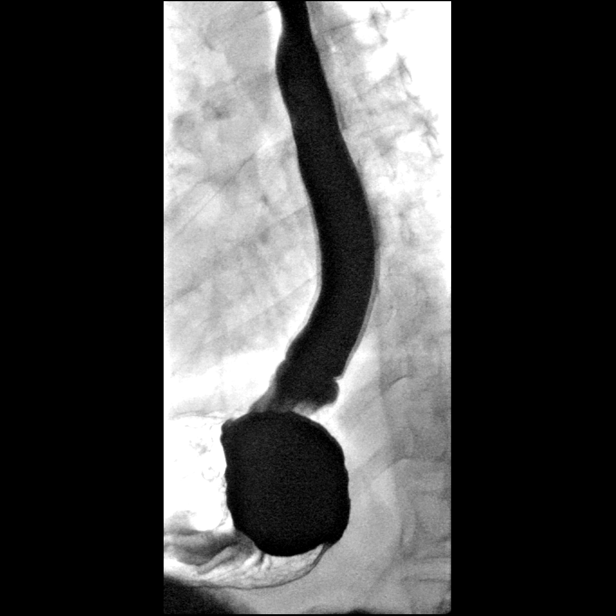
[frame 60/118]
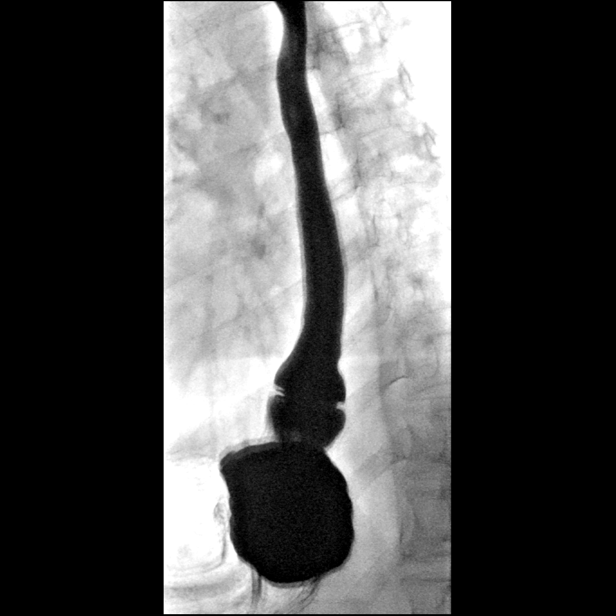

[Series 4: one shot · 2 of 5 slices shown (2 of 3)]
[im 1/5]
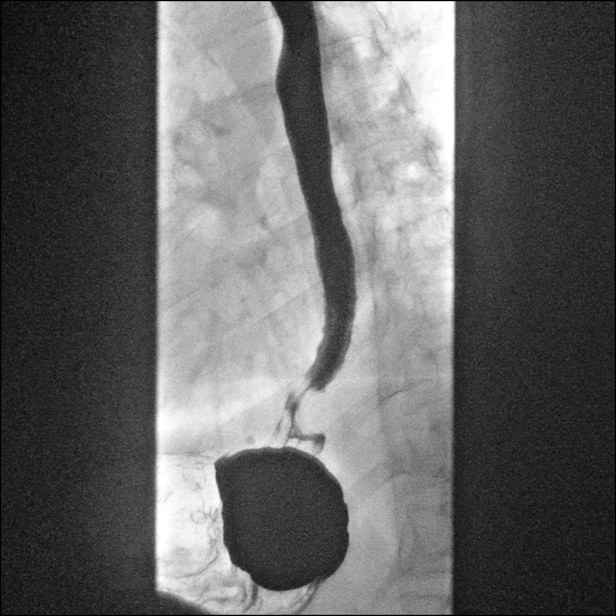
[im 4/5]
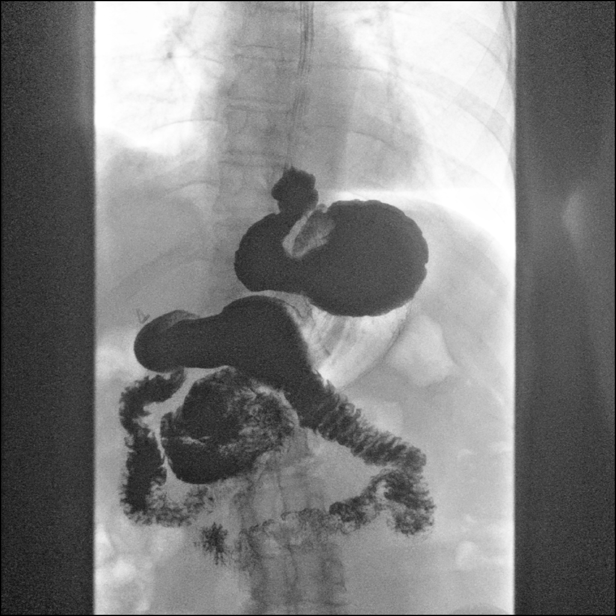

[Series 5: sequence · 2 of 94 frames shown (3 of 4)]
[frame 48/94]
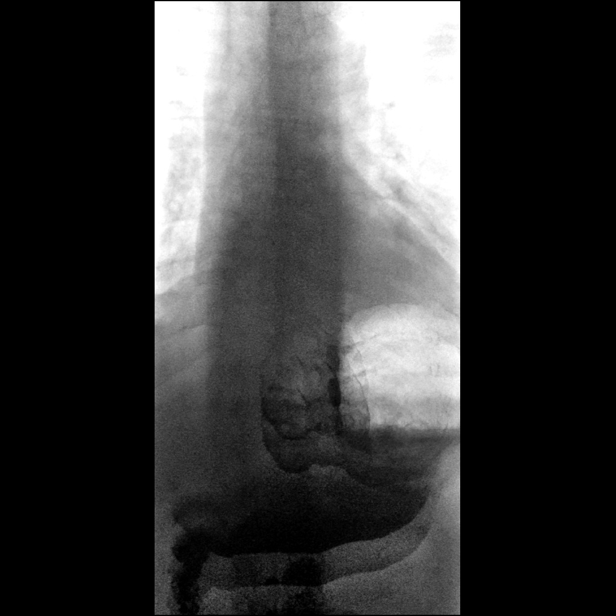
[frame 49/94]
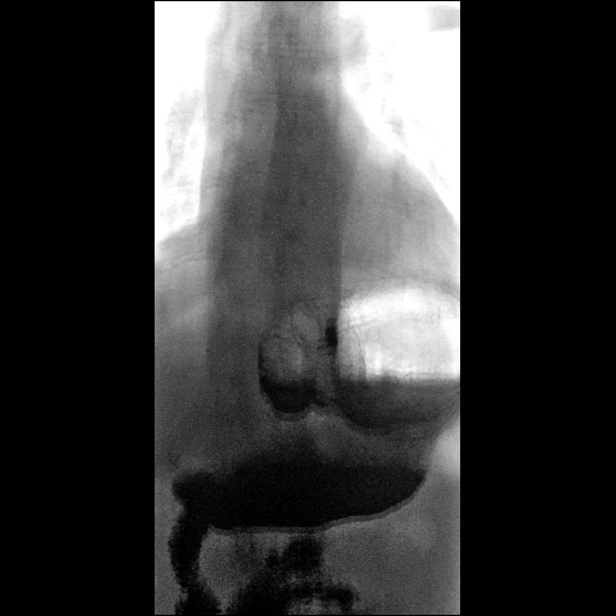

[Series 6: sequence · 1 of 48 frames shown (4 of 4)]
[frame 25/48]
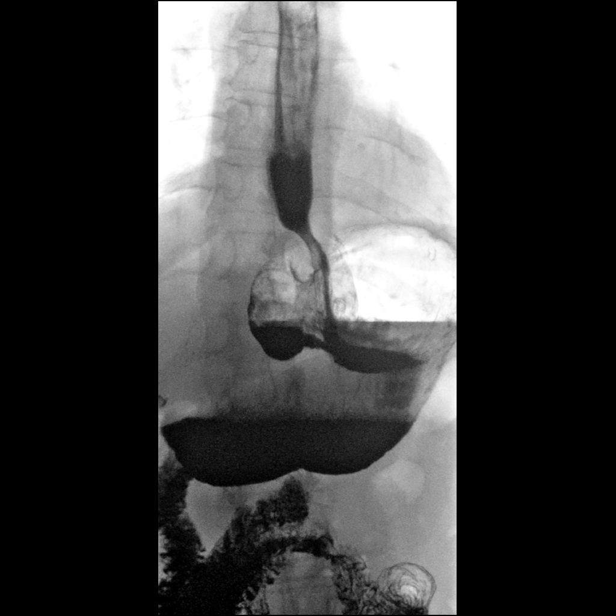

[Series 7: one shot · 1 of 1 slices shown (3 of 3)]
[im 1/1]
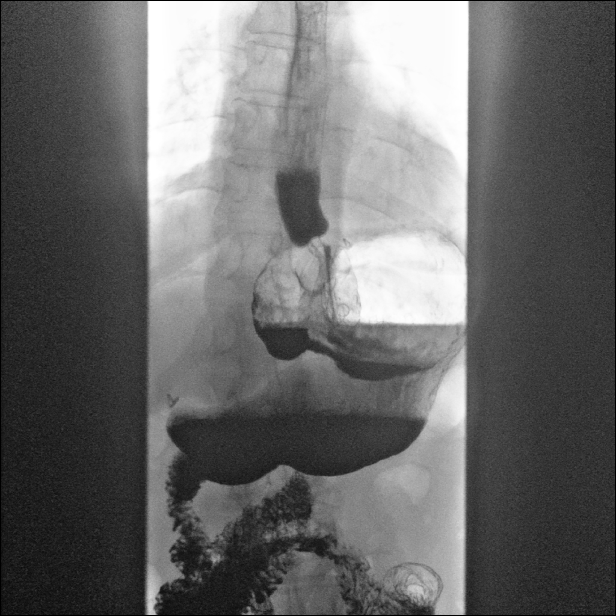

[14 of 24 positions shown; findings below may reference images not displayed]

FINDINGS: Scout radiograph demonstrates no dilated small bowel loops. Mild
colonic stool volume. Cholecystectomy clips are seen in the right
upper quadrant of the abdomen. No evidence of pneumatosis or
pneumoperitoneum. No radiopaque nephrolithiasis.

Small hiatal hernia. No gastroesophageal reflux elicited, despite
provocative maneuvers including water siphon test. Normal esophageal
mucosa, with no evidence of reflux esophagitis. Mild esophageal
dysmotility, characterized by intermittent mild weakening of primary
peristalsis throughout the thoracic esophagus. Small Schatzki ring
at the esophagogastric junction without significant narrowing. The
swallowed 13 mm barium tablet traversed the esophagus into the
proximal stomach without delay. There is mildly delayed pill transit
through the narrowed gastric cardia at the site of the funoplication
wrap, with pill clearance into the body of the stomach with a barium
swallow. No evidence of esophageal mass, stricture or ulcer. No
gastric fold thickening. Fundoplication defect visualized in the
proximal stomach. Otherwise no gastric filling defects. No gastric
ulcers or significant erosions. Normal gastric emptying. Normal
duodenal bulb and C sweep, with no evidence of duodenal ulcers, fold
thickening, filling defects or strictures. Normal duodenal jejunal
junction to the left of the spine. Visualized proximal jejunal loops
are normal caliber and without fold thickening.
IMPRESSION: 1. Small recurrent hiatal hernia. No gastroesophageal reflux
elicited.
2. Mild esophageal dysmotility, with a pattern suggestive of chronic
reflux related dysmotility.
3. Intact Nissen fundoplication wrap. Mildly delayed transit of the
13 mm barium tablet through the narrowed gastric cardia. No evidence
of esophageal stricture. No masses or ulcers.

## 2021-04-17 ENCOUNTER — Other Ambulatory Visit: Payer: Self-pay | Admitting: Gastroenterology

## 2021-04-17 DIAGNOSIS — R634 Abnormal weight loss: Secondary | ICD-10-CM | POA: Diagnosis not present

## 2021-04-17 DIAGNOSIS — R140 Abdominal distension (gaseous): Secondary | ICD-10-CM | POA: Diagnosis not present

## 2021-04-17 DIAGNOSIS — K8689 Other specified diseases of pancreas: Secondary | ICD-10-CM | POA: Diagnosis not present

## 2021-04-17 DIAGNOSIS — R109 Unspecified abdominal pain: Secondary | ICD-10-CM

## 2021-04-21 ENCOUNTER — Telehealth: Payer: Medicare Other | Admitting: Thoracic Surgery (Cardiothoracic Vascular Surgery)

## 2021-04-24 DIAGNOSIS — M85851 Other specified disorders of bone density and structure, right thigh: Secondary | ICD-10-CM | POA: Diagnosis not present

## 2021-04-24 DIAGNOSIS — N1831 Chronic kidney disease, stage 3a: Secondary | ICD-10-CM | POA: Diagnosis not present

## 2021-04-24 DIAGNOSIS — Z0000 Encounter for general adult medical examination without abnormal findings: Secondary | ICD-10-CM | POA: Diagnosis not present

## 2021-04-24 DIAGNOSIS — K219 Gastro-esophageal reflux disease without esophagitis: Secondary | ICD-10-CM | POA: Diagnosis not present

## 2021-04-24 DIAGNOSIS — J439 Emphysema, unspecified: Secondary | ICD-10-CM | POA: Diagnosis not present

## 2021-04-24 DIAGNOSIS — K8689 Other specified diseases of pancreas: Secondary | ICD-10-CM | POA: Diagnosis not present

## 2021-04-24 DIAGNOSIS — E559 Vitamin D deficiency, unspecified: Secondary | ICD-10-CM | POA: Diagnosis not present

## 2021-04-24 DIAGNOSIS — Z1389 Encounter for screening for other disorder: Secondary | ICD-10-CM | POA: Diagnosis not present

## 2021-04-24 DIAGNOSIS — J679 Hypersensitivity pneumonitis due to unspecified organic dust: Secondary | ICD-10-CM | POA: Diagnosis not present

## 2021-04-24 DIAGNOSIS — Z23 Encounter for immunization: Secondary | ICD-10-CM | POA: Diagnosis not present

## 2021-04-24 DIAGNOSIS — R5381 Other malaise: Secondary | ICD-10-CM | POA: Diagnosis not present

## 2021-04-24 DIAGNOSIS — E7800 Pure hypercholesterolemia, unspecified: Secondary | ICD-10-CM | POA: Diagnosis not present

## 2021-04-27 ENCOUNTER — Other Ambulatory Visit: Payer: Medicare Other

## 2021-04-28 ENCOUNTER — Telehealth: Payer: Medicare Other | Admitting: Thoracic Surgery (Cardiothoracic Vascular Surgery)

## 2021-05-04 ENCOUNTER — Ambulatory Visit
Admission: RE | Admit: 2021-05-04 | Discharge: 2021-05-04 | Disposition: A | Discharge status: Home / Self Care | Payer: Medicare Other | Source: Ambulatory Visit | Attending: Gastroenterology | Admitting: Gastroenterology

## 2021-05-04 DIAGNOSIS — R109 Unspecified abdominal pain: Secondary | ICD-10-CM

## 2021-05-04 DIAGNOSIS — M5126 Other intervertebral disc displacement, lumbar region: Secondary | ICD-10-CM | POA: Diagnosis not present

## 2021-05-04 DIAGNOSIS — R634 Abnormal weight loss: Secondary | ICD-10-CM | POA: Diagnosis not present

## 2021-05-04 DIAGNOSIS — K7689 Other specified diseases of liver: Secondary | ICD-10-CM | POA: Diagnosis not present

## 2021-05-04 DIAGNOSIS — K8689 Other specified diseases of pancreas: Secondary | ICD-10-CM

## 2021-05-04 DIAGNOSIS — Z853 Personal history of malignant neoplasm of breast: Secondary | ICD-10-CM | POA: Diagnosis not present

## 2021-05-04 IMAGING — CT CT ABD-PELV W/ CM
2 of 5 series · 16 of 46 positions shown, 18 images · IV contrast (isovue)
Comparison: None.

CLINICAL DATA: Abd pain Weight loss - 30 pounds x 8 months Hx appy,
GB, hernia repair Hx Left breast ca - lumpectomy, radiation Prior in
PACS 100ml isovue 300^100mL [RH] IOPAMIDOL ([RH])
INJECTION 61%

EXAM:
CT ABDOMEN AND PELVIS WITH CONTRAST
TECHNIQUE: Multidetector CT imaging of the abdomen and pelvis was performed
using the standard protocol following bolus administration of
intravenous contrast.
CONTRAST:  100mL [RH] IOPAMIDOL ([RH]) INJECTION 61%

[Series 2: abd pelvis 5.00 br40 s3 axial · axial · 0.75mm/px · z∈[+1208,+1623]mm · 13 of 95 slices shown, 15 images]
[im 6/95  soft-tissue]
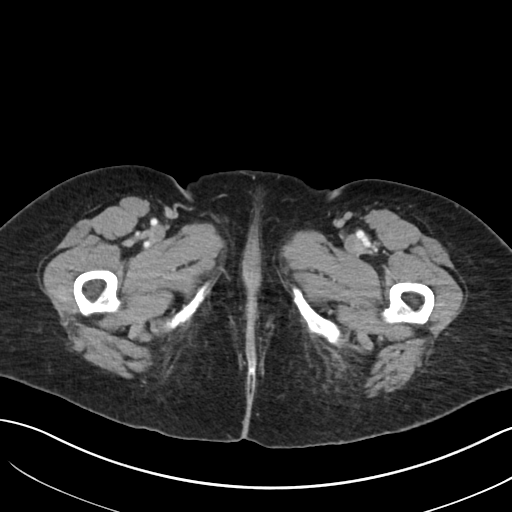
[im 6/95  bone]
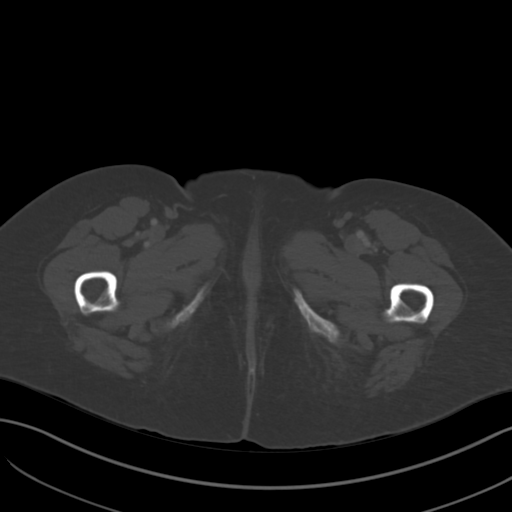
[im 11/95  soft-tissue]
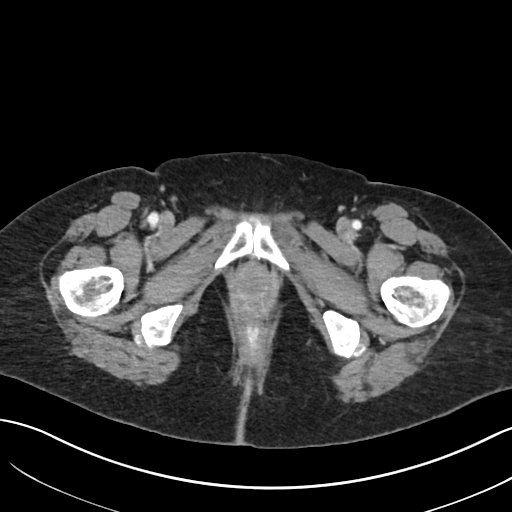
[im 21/95  soft-tissue]
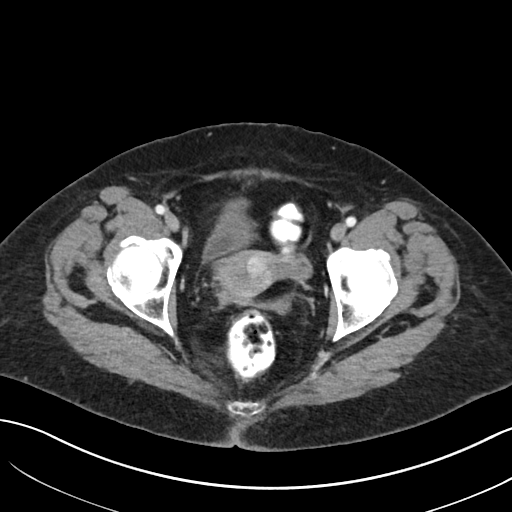
[im 27/95  soft-tissue]
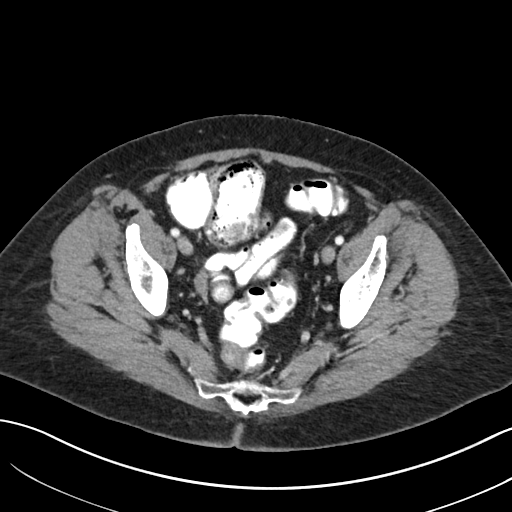
[im 32/95  soft-tissue]
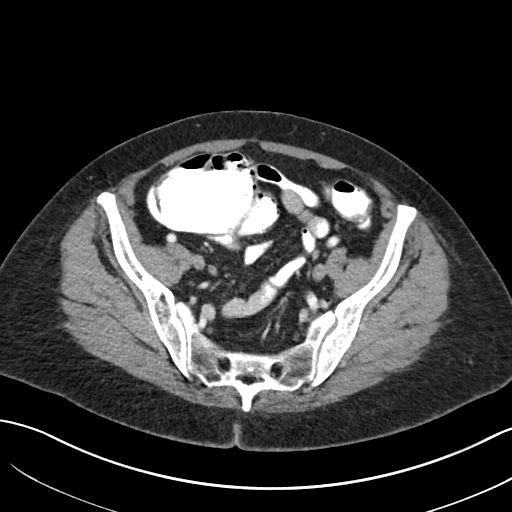
[im 42/95  soft-tissue]
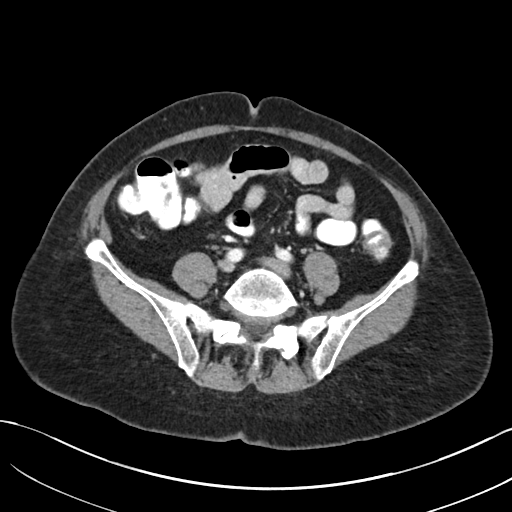
[im 48/95  soft-tissue]
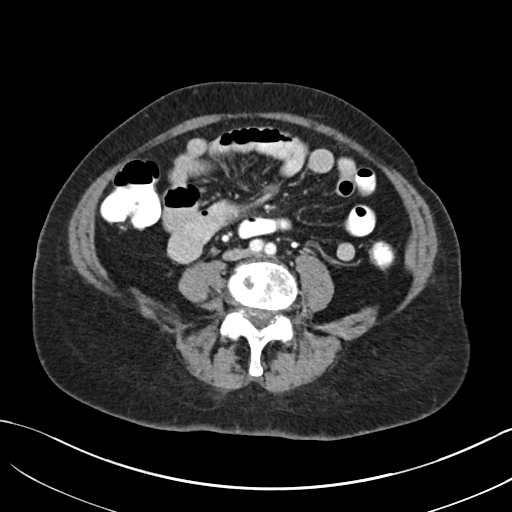
[im 53/95  soft-tissue]
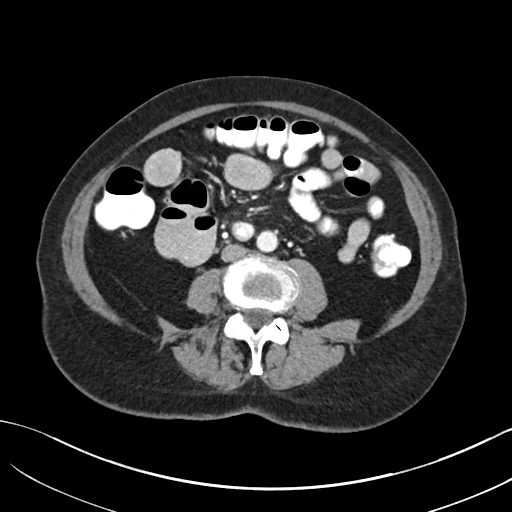
[im 63/95  soft-tissue]
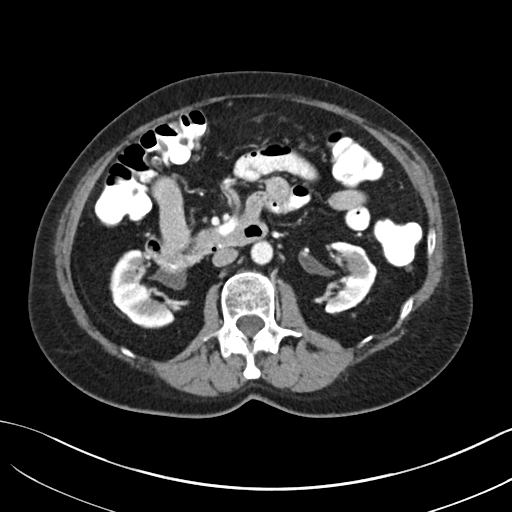
[im 63/95  bone]
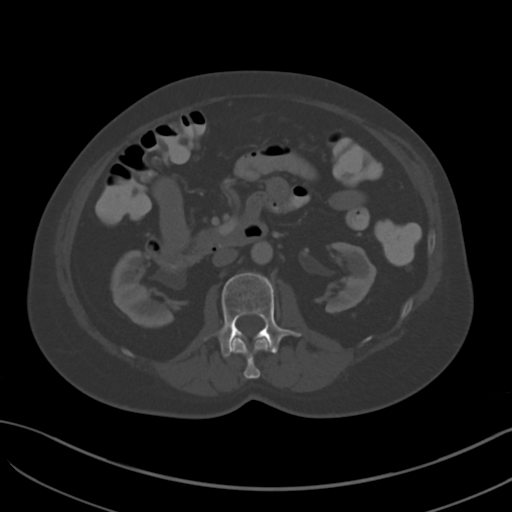
[im 68/95  soft-tissue]
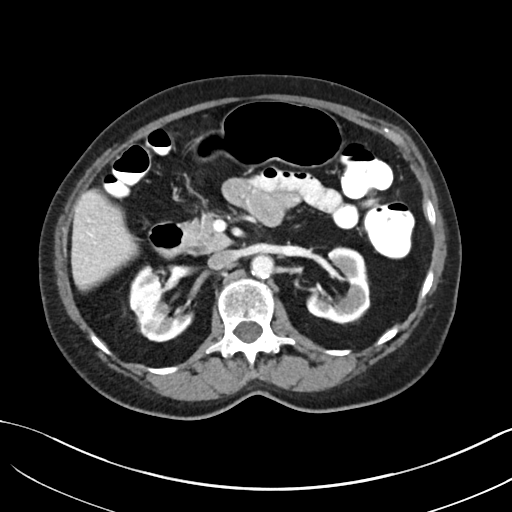
[im 74/95  soft-tissue]
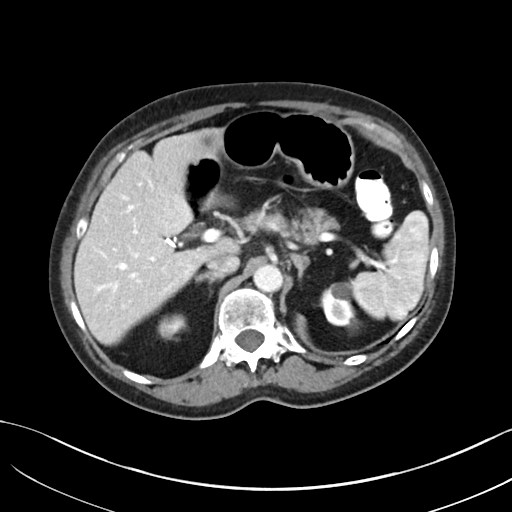
[im 84/95  soft-tissue]
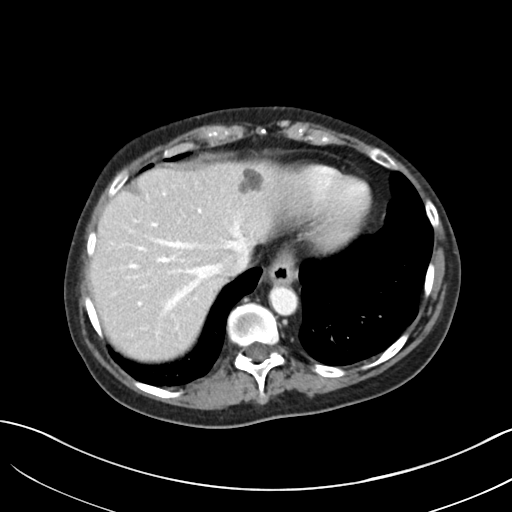
[im 89/95  soft-tissue]
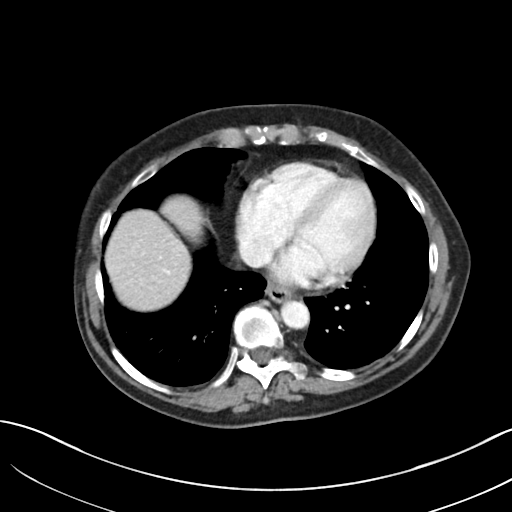

[Series 6: abd pelvis 2.00 br40 s3 cor · coronal · 0.75mm/px · 3 of 190 slices shown]
[im 64/190  soft-tissue]
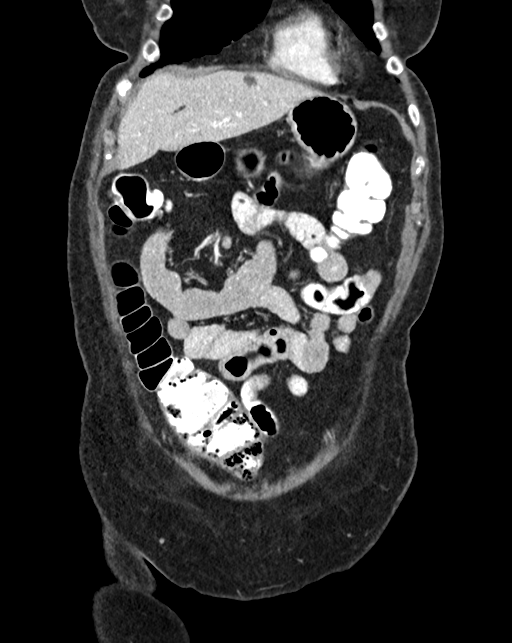
[im 85/190  soft-tissue]
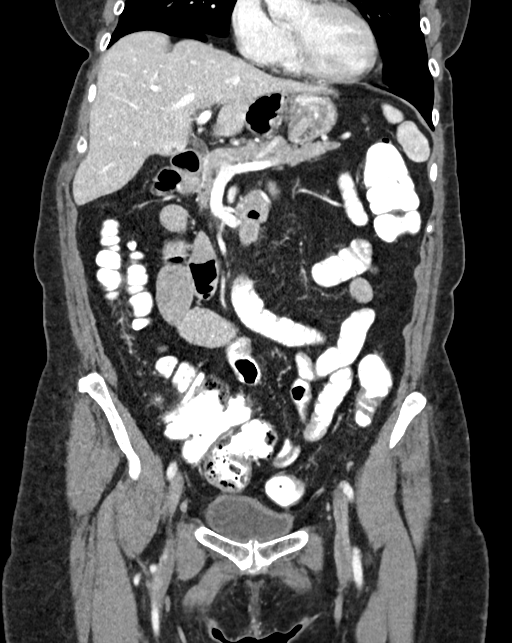
[im 106/190  soft-tissue]
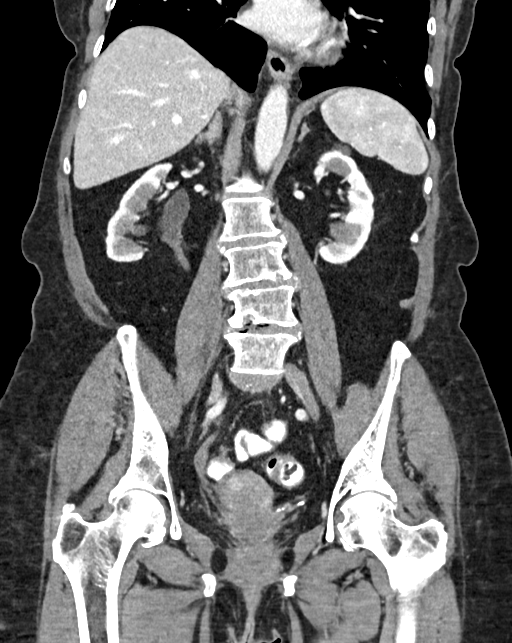

[16 of 46 positions shown; findings below may reference images not displayed]

FINDINGS: Lower chest: Lung bases are clear.

Hepatobiliary: Lobular cyst in the LEFT hepatic lobe measures 22 mm.
Postcholecystectomy. Second fluid density cyst in the inferior RIGHT
hepatic lobe.

Pancreas: Pancreas is normal. No ductal dilatation. No pancreatic
inflammation.

Spleen: Normal spleen

Adrenals/urinary tract: Simple fluid attenuation cyst of the LEFT
kidney measures 27 mm. Smaller low density cyst in the LEFT kidney
measures 16 mm cannot be fully characterized. Cortical low-density
cyst in the RIGHT kidney measures 10 mm incompletely characterized.

Ureters and bladder normal

Stomach/Bowel: Stomach, duodenum small-bowel normal. Cecum normal.
The colon and rectosigmoid colon are normal.

Vascular/Lymphatic: Abdominal aorta is normal caliber. No periportal
or retroperitoneal adenopathy. No pelvic adenopathy.

Reproductive: Uterus and adnexa unremarkable.

Other: No peritoneal disease

Musculoskeletal: No aggressive osseous lesion. Disc bulge at L4-L5
(image 97/series 8)
IMPRESSION: 1. No evidence of malignancy in the abdomen pelvis.
2. Indeterminate low-density lesions in the kidneys are favored
benign cysts.
3. Benign hepatic cysts.
4. Disc protrusion at L4-L5.

## 2021-05-04 MED ORDER — IOPAMIDOL (ISOVUE-300) INJECTION 61%
100.0000 mL | Freq: Once | INTRAVENOUS | Status: AC | PRN
  Administered 2021-05-04: 100 mL via INTRAVENOUS

## 2021-05-05 ENCOUNTER — Telehealth (INDEPENDENT_AMBULATORY_CARE_PROVIDER_SITE_OTHER): Payer: Medicare Other | Admitting: Thoracic Surgery (Cardiothoracic Vascular Surgery)

## 2021-05-05 ENCOUNTER — Other Ambulatory Visit: Payer: Self-pay

## 2021-05-05 DIAGNOSIS — Z9889 Other specified postprocedural states: Secondary | ICD-10-CM

## 2021-05-05 NOTE — Progress Notes (Signed)
     [address]       [place] [address]             [phone]       Patient: Home Provider: Office Consent for Telemedicine visit obtained.  Today's visit was completed via a real-time telehealth (see specific modality noted below). The patient/authorized person provided oral consent at the time of the visit to engage in a telemedicine encounter with the present provider at [facility]. The patient/authorized person was informed of the potential benefits, limitations, and risks of telemedicine. The patient/authorized person expressed understanding that the laws that protect confidentiality also apply to telemedicine. The patient/authorized person acknowledged understanding that telemedicine does not provide emergency services and that he or she would need to call 911 or proceed to the nearest hospital for help if such a need arose.  . Total time spent in the clinical discussion 10 minutes. . Telehealth Modality: Phone visit (audio only)  I had a telephone visit with Yvonne Jordan.  Over the last several months Yvonne Jordan has been diagnosed with pancreatic insufficiency, and has had significant diarrhea and weight loss.  Her reflux symptoms are no longer the main issue, and she has been able to tolerate food without any difficulty.  She did undergo a swallow study that showed a small recurrence in her hiatal hernia but there was no evidence of any reflux.  She also had a CT abdomen which did not show a hiatal hernia.  She is currently undergoing therapy for pancreatic insufficiency.  She will follow-up with [place] as needed.

## 2021-06-07 ENCOUNTER — Other Ambulatory Visit: Payer: Self-pay | Admitting: Family Medicine

## 2021-06-07 DIAGNOSIS — Z1231 Encounter for screening mammogram for malignant neoplasm of breast: Secondary | ICD-10-CM

## 2021-06-23 ENCOUNTER — Other Ambulatory Visit: Payer: Self-pay

## 2021-06-23 ENCOUNTER — Encounter: Payer: Medicare Other | Attending: Gastroenterology | Admitting: Dietician

## 2021-06-23 DIAGNOSIS — K8689 Other specified diseases of pancreas: Secondary | ICD-10-CM | POA: Insufficient documentation

## 2021-06-23 DIAGNOSIS — N182 Chronic kidney disease, stage 2 (mild): Secondary | ICD-10-CM | POA: Diagnosis not present

## 2021-06-23 NOTE — Patient Instructions (Addendum)
You Tube "Sit and Be Fit" armchair exercises Consider trying a probiotic  Small frequent meals Chew foods well Limit fiber if you are having problems tolerating this or you link this to diarrhea  Choose unsaturated fats, avoid trans fats, limit saturated fats. Choose a fat free creamer Make your dips with fat free sour cream Consider natural peanut butter rather than processed with hydroginated fat. Consider olive oil rather than mayo Avoid or limit alcohol Avoid laying down for 3-4 hours after eating Carbonated beverages may increase bloating  Consider a smoothie:  1/2 banana 3/4 cup berries or peaches, low fat yogurt, almond milk or skim milk  Make snacks nutritious and limit chips  Start a diary.  What foods/meals/portions sizes do you not tolerate?  Do you always not tolerate or is it occasional.

## 2021-06-23 NOTE — Progress Notes (Signed)
Medical Nutrition Therapy  Appointment Start time:  [phone]  Appointment End time:  1015  Primary concerns today: Patient states that she was diagnosed with EPI and this is mostly controlled with Creon but states that she has diarrhea at times and needs to learn more related to how to eat.  She complains of increased gas. She is concerned that she is losing weight.   She would like to start back to walking but feels very weak.  She uses a walker when she walks for a distance. Referral diagnosis: Gastroparesis, CKD Preferred learning style: no preference indicated Learning readiness: ready   NUTRITION ASSESSMENT   Anthropometrics  65" 158 lbs 06/23/2021 190 lbs 08/2020 and lost post hiatal hernia repair surgery  Clinical Medical Hx: EPI, gastroparesis, [employer] Fundoplication 07/3150, vitamin D deficiency, breast cancer (age 75 in remission), CKD stage 3 Medications: Creon 36,000 units (2 with each meal and 1 with snacks) Labs: Pancreatic Elastase <50 - severe pancreatic insufficiency, eGFR 03/17/2021 60 increased from 09/02/2020 Notable Signs/Symptoms: diarrhea at times, gas, weight loss  Lifestyle & Dietary Hx Patient lives with her husband who is in a wheel chair and has diabetes.  He does help some with dishes and breakfast.  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  Current average weekly physical activity: limited.  Weakness and risk for falls  24-Hr Dietary Recall First Meal: instant oatmeal with apple and cinnamon OR 1 slice bread with egg Snack: occasional angel food cake Second Meal: chicken, potato salad, slaw OR spaghetti with marinara and 1 meatball, [place] toast garlic bread OR 1/2 [place] sandwich with lettuce, tomato, onion, mayo, chips  Snack: potato chips Third Meal: similar to lunch OR taco's with fried tortillas, beef, beans, cheese  Snack: fruit cup or grapefruit segments or watermelon Beverages: water, low acid caffeinated coffee with  stevia and hazelnut sweetened creamer, regular coke, occasional alcohol, occasional lemonade, occasional tea   NUTRITION DIAGNOSIS  NB-1.1 Food and nutrition-related knowledge deficit As related to diet for exocrine pancreatic insufficiency.  As evidenced by patient report.   NUTRITION INTERVENTION  Nutrition education (E-1) on the following topics:  Benefits of exercise to increase strength with options secondary current barriers Benefits of small frequent meals Chew foods well which helps digestion Types of fat and recommendation to choose unsaturated fats and avoid saturated and trans fats.  High saturated fat meals may cause diarrhea. Fiber as tolerated Discussed alcohol, concern of risk to pancrease and current very low intak Recommendations to avoid laying down for 3-4 hours after meals Carbonated beverages may increase bloating Snacks with improved nutrition quality Benefits of journaling  Handouts Provided Include    Learning Style & Readiness for Change Teaching method utilized: Visual & Auditory  Demonstrated degree of understanding via: Teach Back  Barriers to learning/adherence to lifestyle change: risk of falls  Goals Established by Pt You Tube "Sit and Be Fit" armchair exercises Consider trying a probiotic  Small frequent meals Chew foods well Limit fiber if you are having problems tolerating this or you link this to diarrhea  Choose unsaturated fats, avoid trans fats, limit saturated fats. Choose a fat free creamer Make your dips with fat free sour cream Consider natural peanut butter rather than processed with hydroginated fat. Consider olive oil rather than mayo Avoid or limit alcohol Avoid laying down for 3-4 hours after eating Carbonated beverages may increase bloating  Consider a smoothie:  1/2 banana 3/4 cup berries or peaches, low fat  yogurt, almond milk or skim milk  Make snacks nutritious and limit chips  Start a diary.  What  foods/meals/portions sizes do you not tolerate?  Do you always not tolerate or is it occasional.   MONITORING & EVALUATION Dietary intake, weekly physical activity, and diet tolerance in 6 weeks.  Next Steps  Patient is to call for questions as needed.

## 2021-07-24 ENCOUNTER — Other Ambulatory Visit: Payer: Self-pay | Admitting: Surgery

## 2021-07-24 DIAGNOSIS — E042 Nontoxic multinodular goiter: Secondary | ICD-10-CM

## 2021-07-25 DIAGNOSIS — K8681 Exocrine pancreatic insufficiency: Secondary | ICD-10-CM | POA: Diagnosis not present

## 2021-07-26 DIAGNOSIS — K8681 Exocrine pancreatic insufficiency: Secondary | ICD-10-CM | POA: Diagnosis not present

## 2021-07-26 DIAGNOSIS — E042 Nontoxic multinodular goiter: Secondary | ICD-10-CM | POA: Diagnosis not present

## 2021-08-01 ENCOUNTER — Other Ambulatory Visit: Payer: Self-pay

## 2021-08-01 ENCOUNTER — Ambulatory Visit
Admission: RE | Admit: 2021-08-01 | Discharge: 2021-08-01 | Disposition: A | Discharge status: Home / Self Care | Payer: Medicare Other | Source: Ambulatory Visit | Attending: Surgery | Admitting: Surgery

## 2021-08-01 DIAGNOSIS — E042 Nontoxic multinodular goiter: Secondary | ICD-10-CM

## 2021-08-01 DIAGNOSIS — E041 Nontoxic single thyroid nodule: Secondary | ICD-10-CM | POA: Diagnosis not present

## 2021-08-01 IMAGING — US US THYROID
1 series · 13 of 25 positions shown · non-contrast
Comparison: [DATE]

CLINICAL DATA: 74-year-old female with a history of thyroid nodules

EXAM:
THYROID ULTRASOUND
TECHNIQUE: Ultrasound examination of the thyroid gland and adjacent soft
tissues was performed.

[Series 1: us thyroid · 0.08mm/px · 13 of 42 slices shown]
[im 1/42]
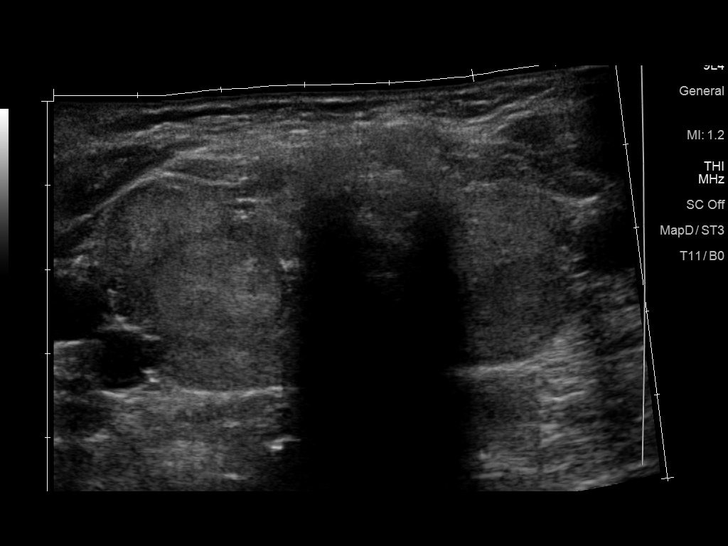
[im 4/42]
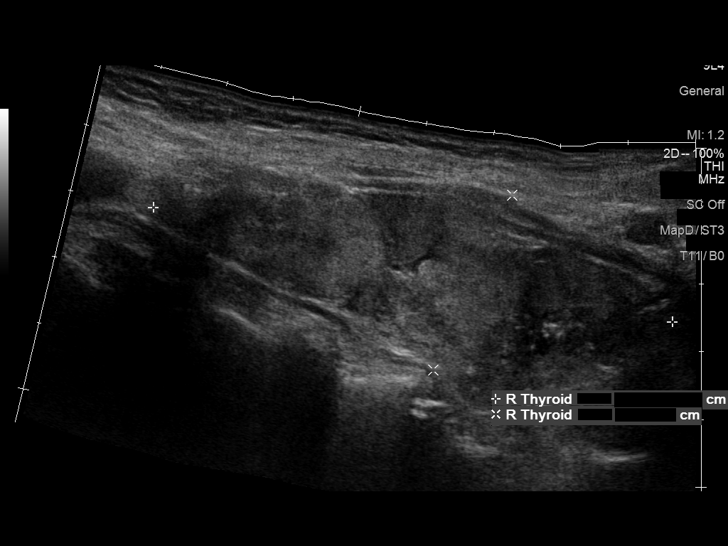
[im 7/42]
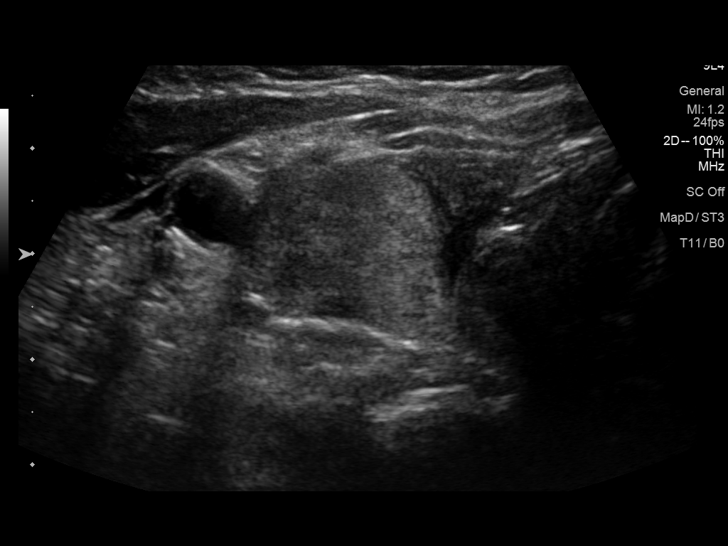
[im 11/42]
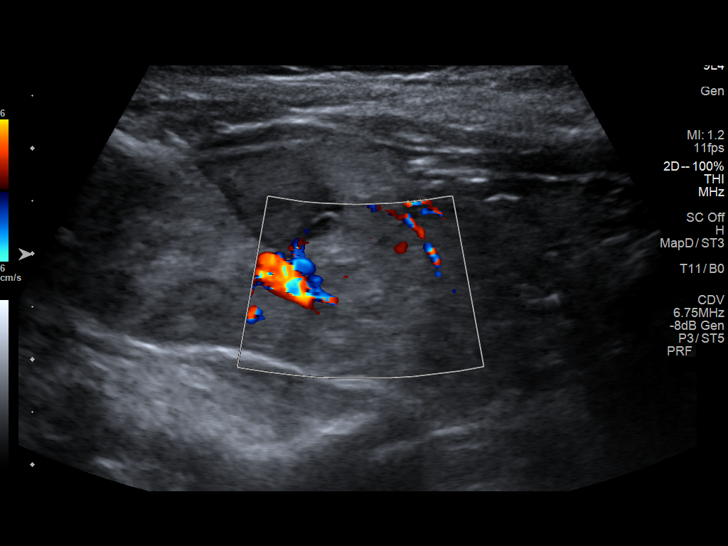
[im 14/42]
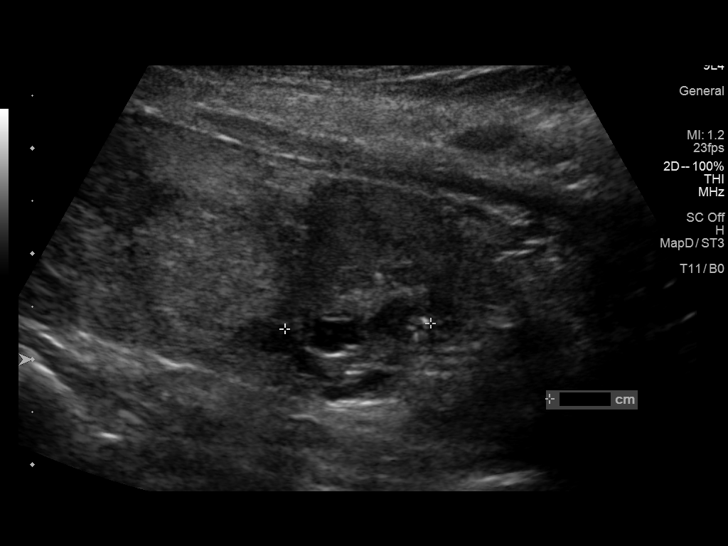
[im 18/42]
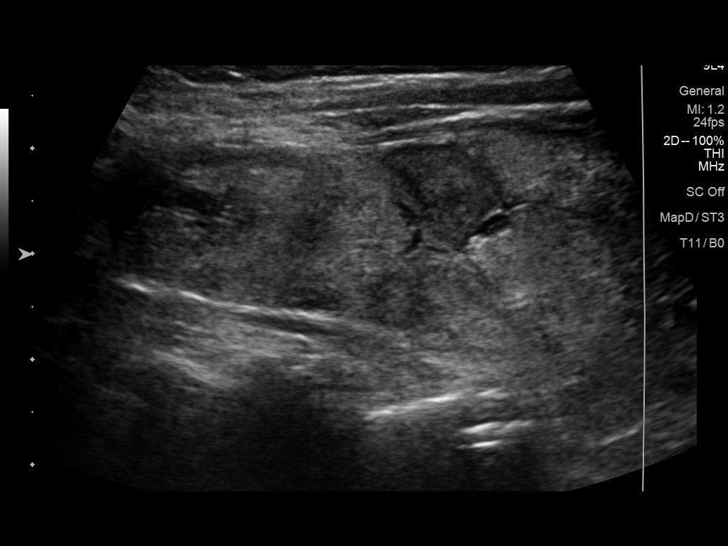
[im 21/42]
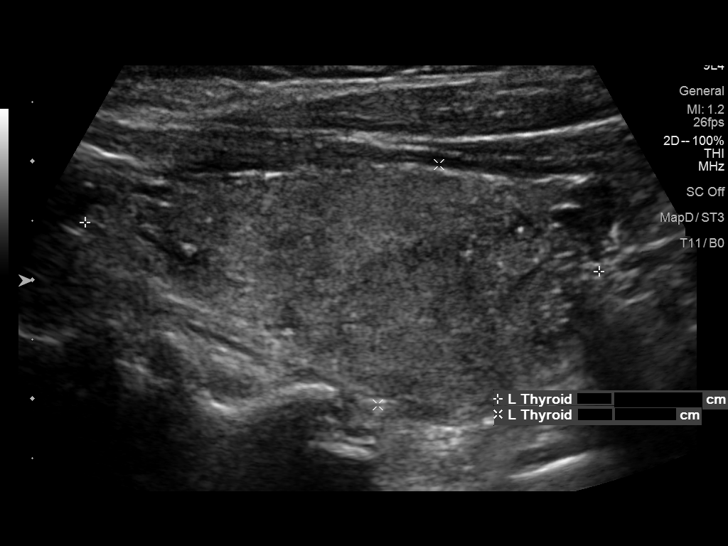
[im 24/42]
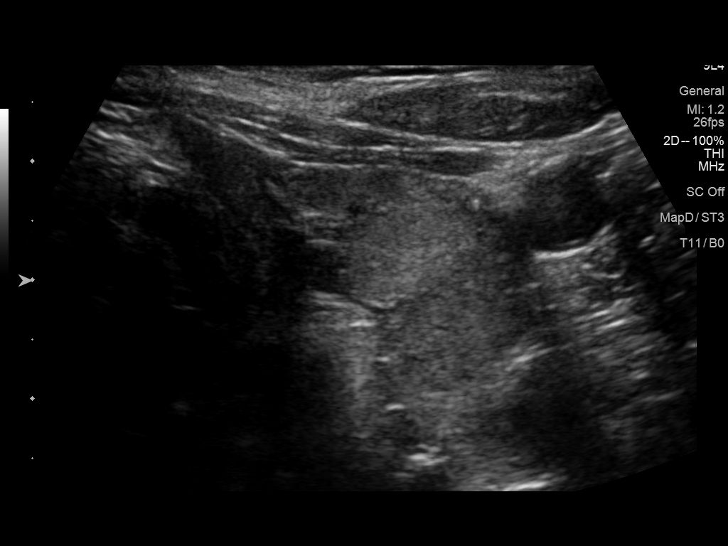
[im 28/42]
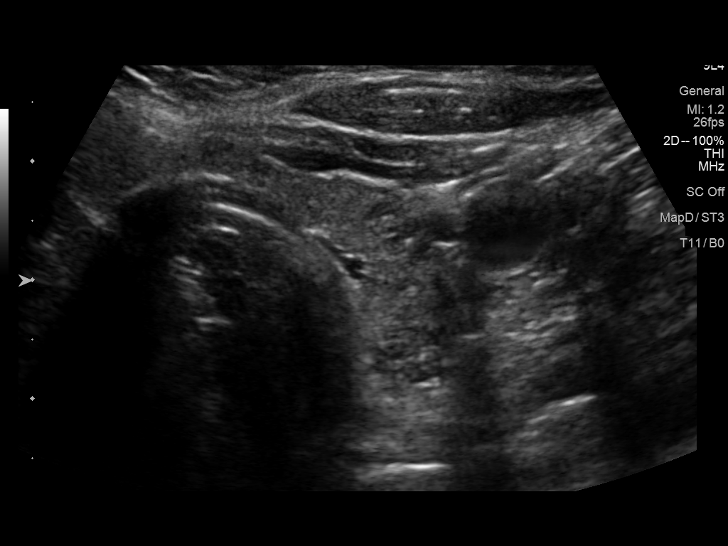
[im 31/42]
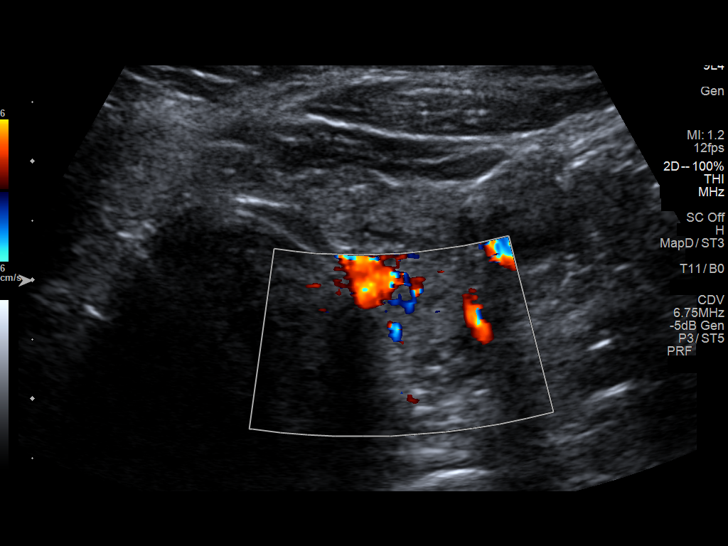
[im 35/42]
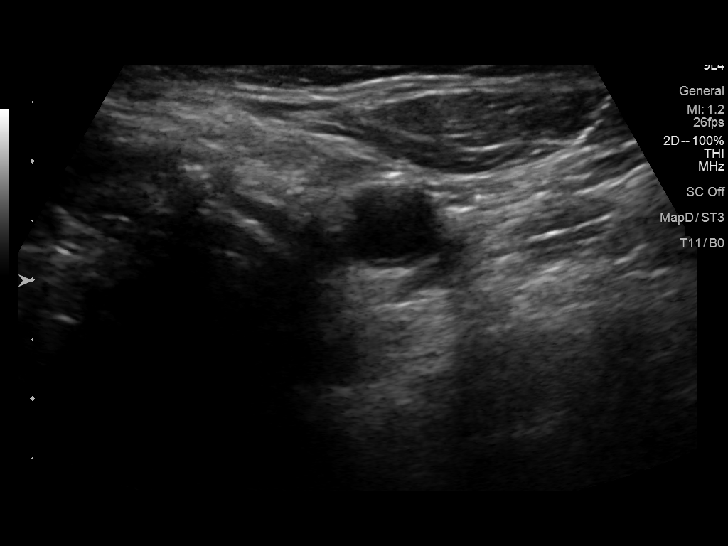
[im 38/42]
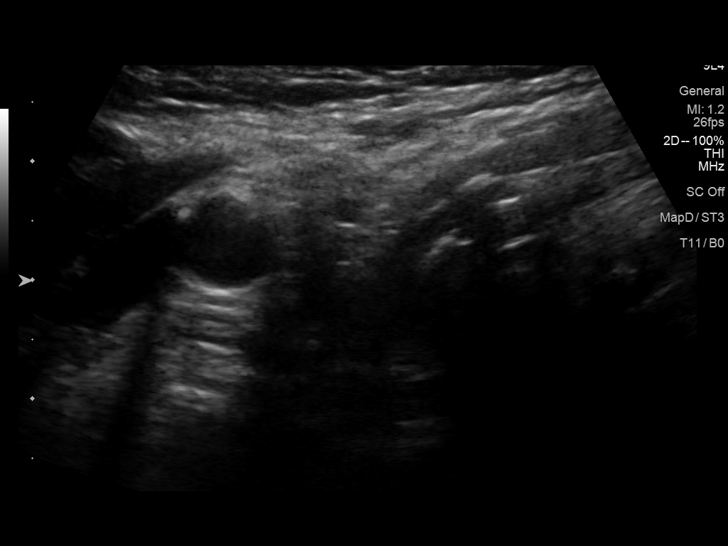
[im 42/42]
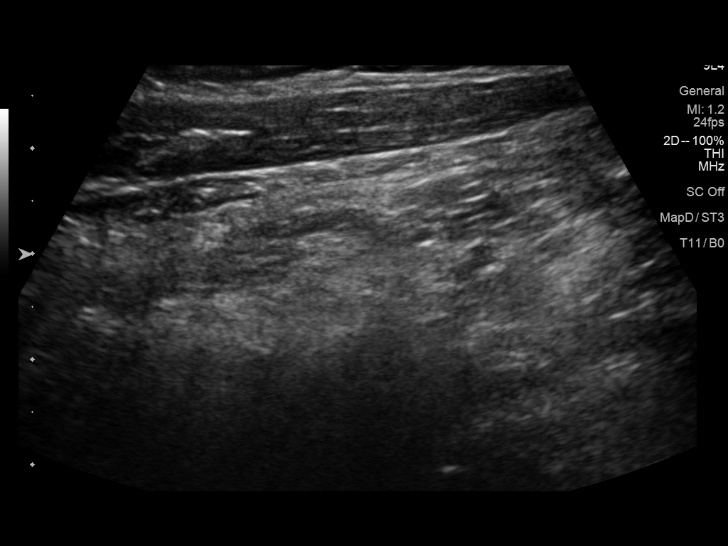

[13 of 25 positions shown; findings below may reference images not displayed]

FINDINGS: Parenchymal Echotexture: Moderately heterogenous

Isthmus: 0.4 cm

Right lobe: 2.8 cm x 2.8 cm x 2.6 cm

Left lobe: 4.4 cm x 2.1 cm x 1.7 cm

_________________________________________________________

Estimated total number of nodules >/= 1 cm: 2

Number of spongiform nodules >/=  2 cm not described below (TR1): 0

Number of mixed cystic and solid nodules >/= 1.5 cm not described
below (TR2): 0

_________________________________________________________

Nodule labeled 1 in the lower right thyroid measures smaller than
previous, 1.4 cm with spongiform/cystic change. Nodule does not meet
criteria for further surveillance.

Nodule labeled 2 in the mid left thyroid, 1.49 cm, not significantly
changed from prior. Nodule remains TR 4 and meets criteria for
surveillance.

Nodule labeled 3 inferior left thyroid does not meet criteria for
surveillance.

No adenopathy
IMPRESSION: Left mid thyroid nodule (labeled 2, TR 4) meets criteria for
continued surveillance, as designated by the newly established ACR
TI-RADS criteria. Surveillance ultrasound study recommended to be
performed annually up to 5 years.

Recommendations follow those established by the new ACR TI-RADS
criteria ([HOSPITAL] [BZ];[DATE]).

## 2021-08-02 ENCOUNTER — Ambulatory Visit
Admission: RE | Admit: 2021-08-02 | Discharge: 2021-08-02 | Disposition: A | Discharge status: Home / Self Care | Payer: Medicare Other | Source: Ambulatory Visit | Attending: Family Medicine | Admitting: Family Medicine

## 2021-08-02 DIAGNOSIS — Z1231 Encounter for screening mammogram for malignant neoplasm of breast: Secondary | ICD-10-CM | POA: Diagnosis not present

## 2021-08-02 IMAGING — MG MM DIGITAL SCREENING BILAT W/ TOMO AND CAD
6 of 10 series · 6 of 30 positions shown · non-contrast
Comparison: Previous exam(s).

CLINICAL DATA: Screening.

EXAM:
DIGITAL SCREENING BILATERAL MAMMOGRAM WITH TOMOSYNTHESIS AND CAD
TECHNIQUE: Bilateral screening digital craniocaudal and mediolateral oblique
mammograms were obtained. Bilateral screening digital breast
tomosynthesis was performed. The images were evaluated with
computer-aided detection.

[L MLO synth-2D]
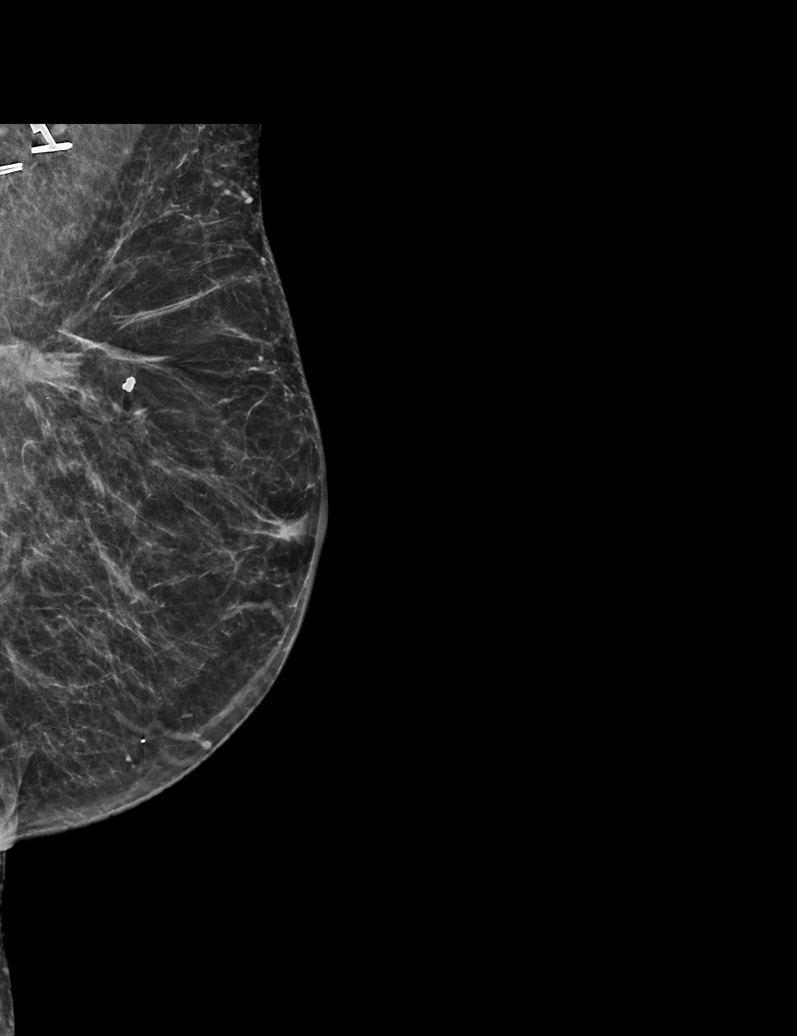

[L CC synth-2D (1 of 2)]
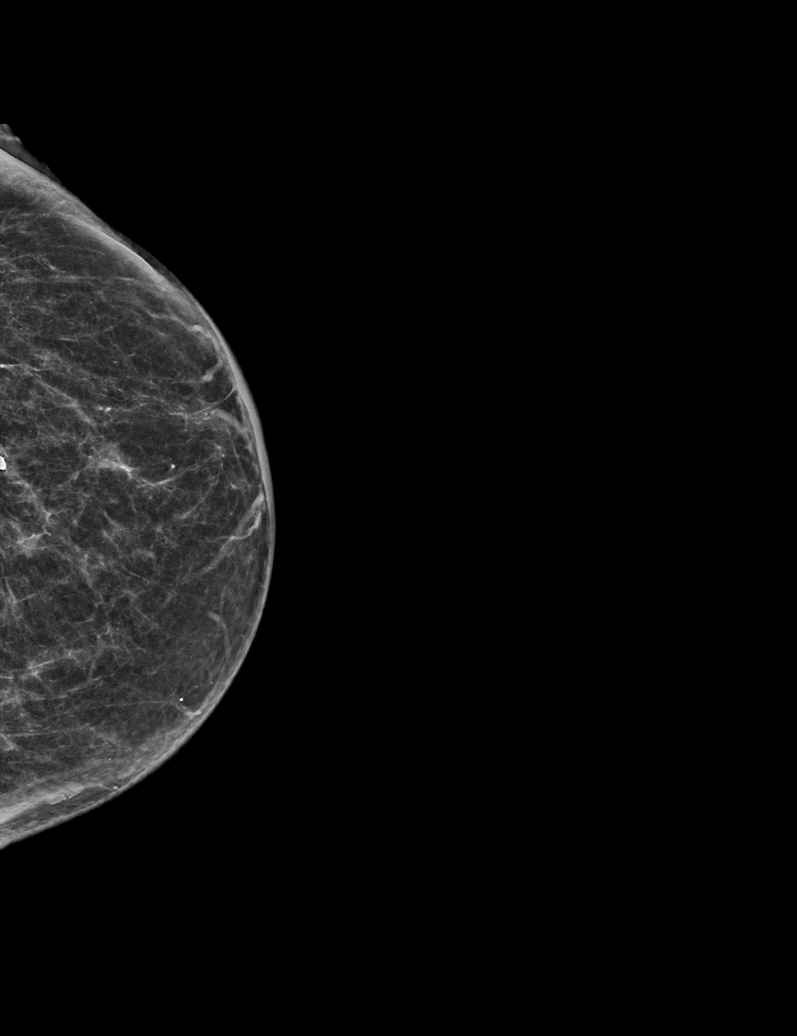

[L CC synth-2D (2 of 2)]
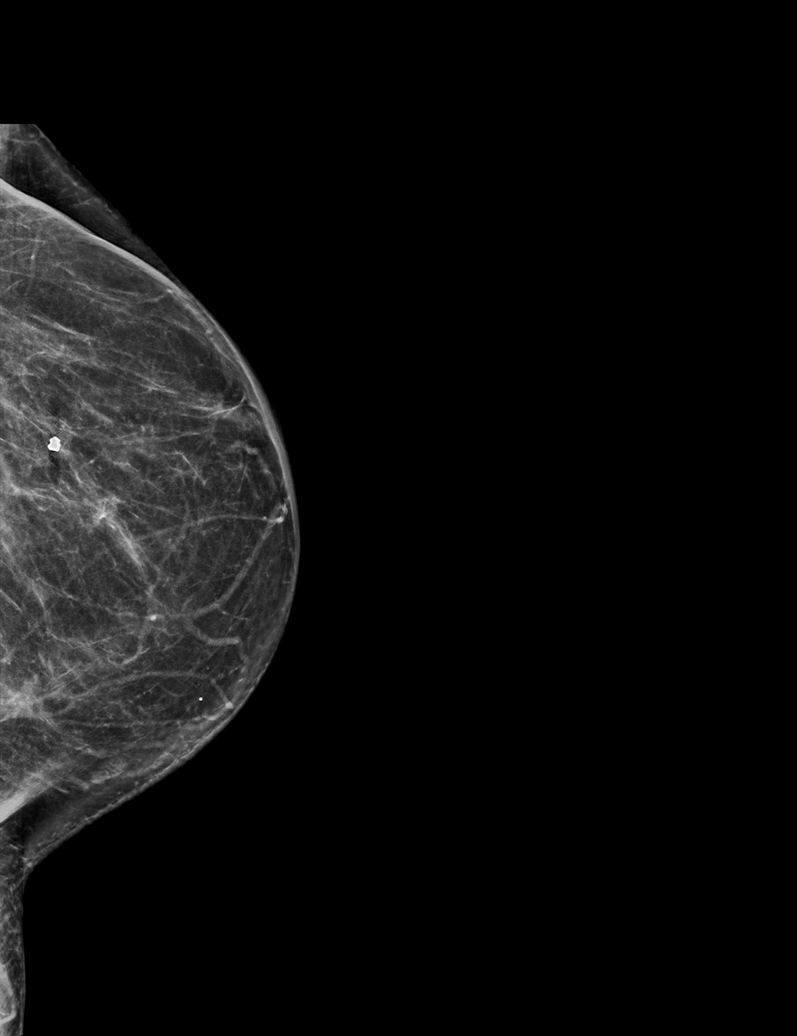

[R MLO synth-2D]
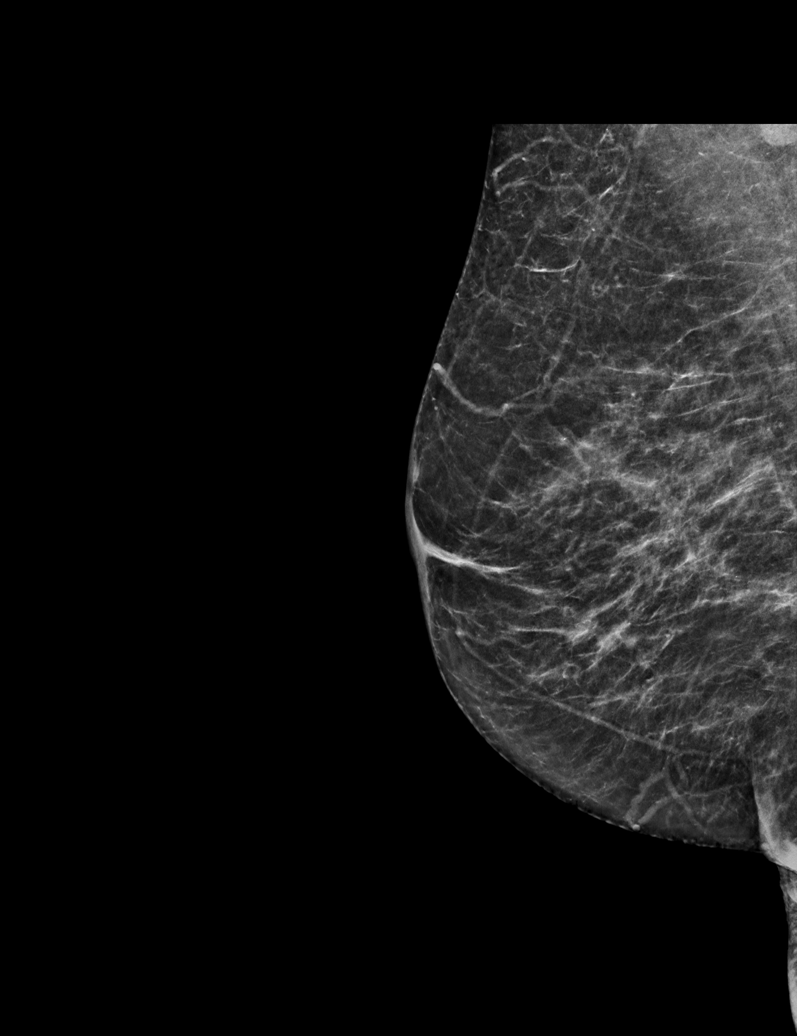

[R CC synth-2D]
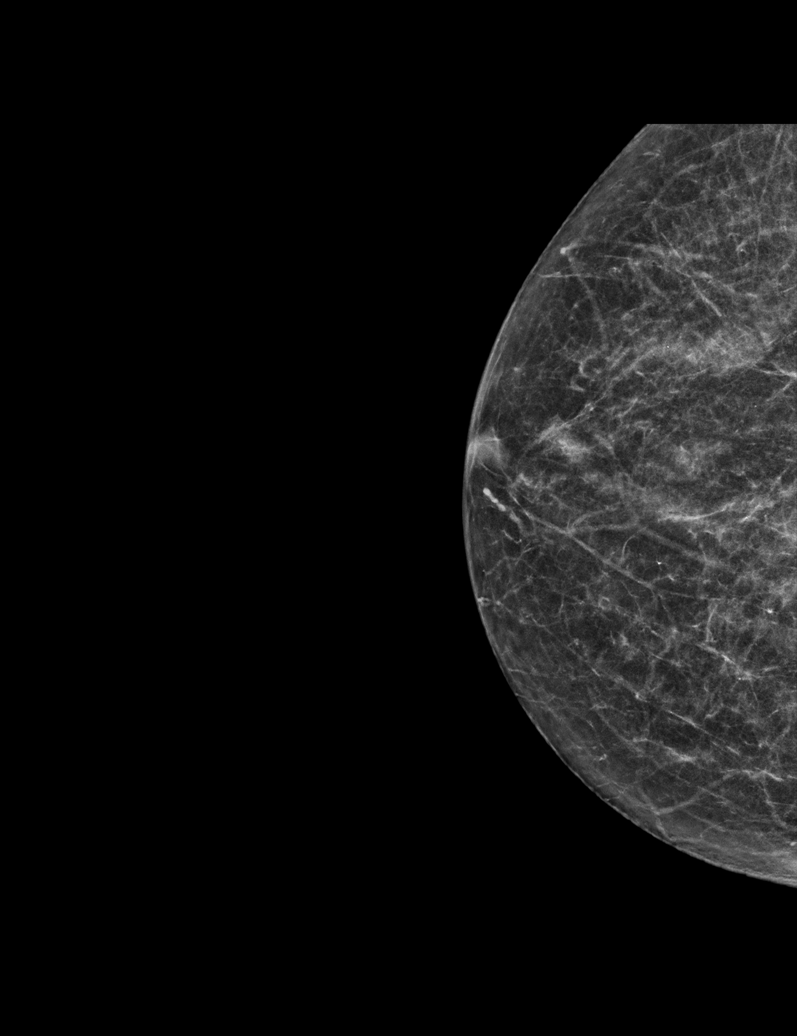

[L MLO tomo · tomo slice 25/50.0]
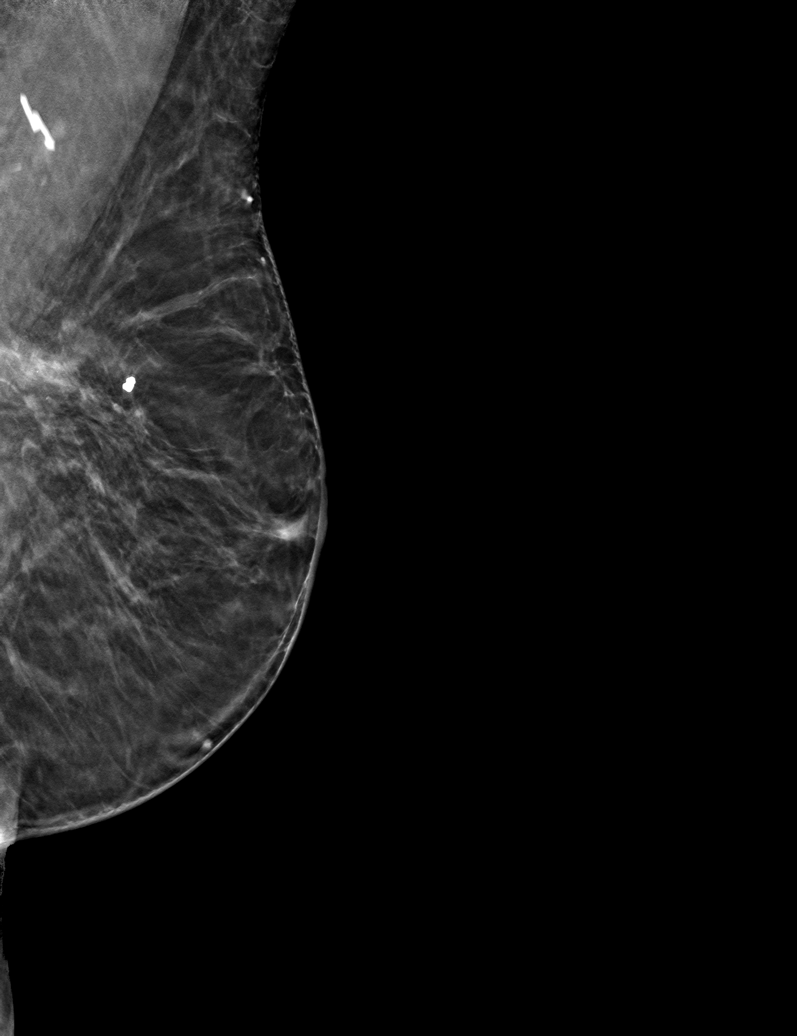

[6 of 30 positions shown; findings below may reference images not displayed]

ACR Breast Density Category b: There are scattered areas of
fibroglandular density.
FINDINGS: In the left breast, a possible asymmetry warrants further
evaluation. In the right breast, no findings suspicious for
malignancy.
IMPRESSION: Further evaluation is suggested for possible asymmetry in the left
breast.

RECOMMENDATION:
Diagnostic mammogram and possibly ultrasound of the left breast.
(Code:[1K])

The patient will be contacted regarding the findings, and additional
imaging will be scheduled.

BI-RADS CATEGORY  0: Incomplete. Need additional imaging evaluation
and/or prior mammograms for comparison.

## 2021-08-07 ENCOUNTER — Other Ambulatory Visit: Payer: Self-pay | Admitting: Family Medicine

## 2021-08-07 DIAGNOSIS — R928 Other abnormal and inconclusive findings on diagnostic imaging of breast: Secondary | ICD-10-CM

## 2021-08-12 ENCOUNTER — Other Ambulatory Visit: Payer: Self-pay

## 2021-08-12 ENCOUNTER — Ambulatory Visit: Payer: Medicare Other

## 2021-08-12 ENCOUNTER — Ambulatory Visit
Admission: RE | Admit: 2021-08-12 | Discharge: 2021-08-12 | Disposition: A | Discharge status: Home / Self Care | Payer: Medicare Other | Source: Ambulatory Visit | Attending: Family Medicine | Admitting: Family Medicine

## 2021-08-12 DIAGNOSIS — R928 Other abnormal and inconclusive findings on diagnostic imaging of breast: Secondary | ICD-10-CM | POA: Diagnosis not present

## 2021-08-12 DIAGNOSIS — R922 Inconclusive mammogram: Secondary | ICD-10-CM | POA: Diagnosis not present

## 2021-08-12 IMAGING — MG MM DIGITAL DIAGNOSTIC UNILAT*L* W/ TOMO W/ CAD
6 series · 6 of 18 positions shown · non-contrast
Comparison: Previous exams.

CLINICAL DATA: Screening recall for possible left breast asymmetry.
History of left lumpectomy [7T].

EXAM:
DIGITAL DIAGNOSTIC UNILATERAL LEFT MAMMOGRAM WITH TOMOSYNTHESIS AND
CAD
TECHNIQUE: Left digital diagnostic mammography and breast tomosynthesis was
performed. The images were evaluated with computer-aided detection.

[L ML synth-2D]
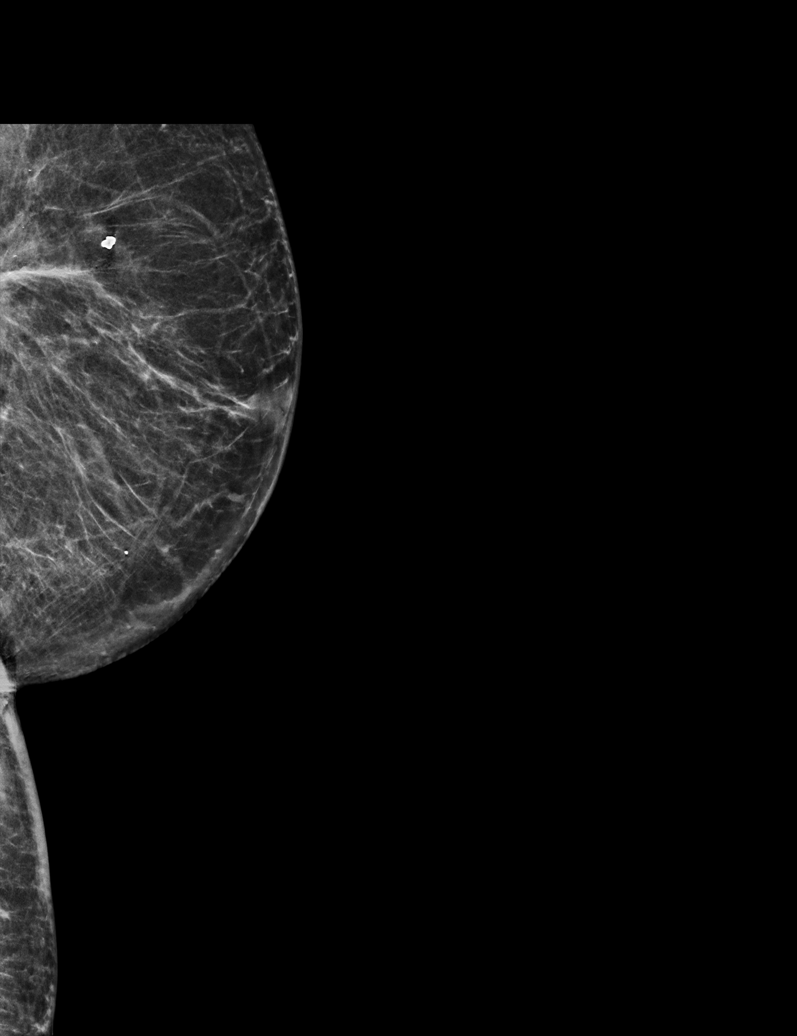

[L CC synth-2D (1 of 2)]
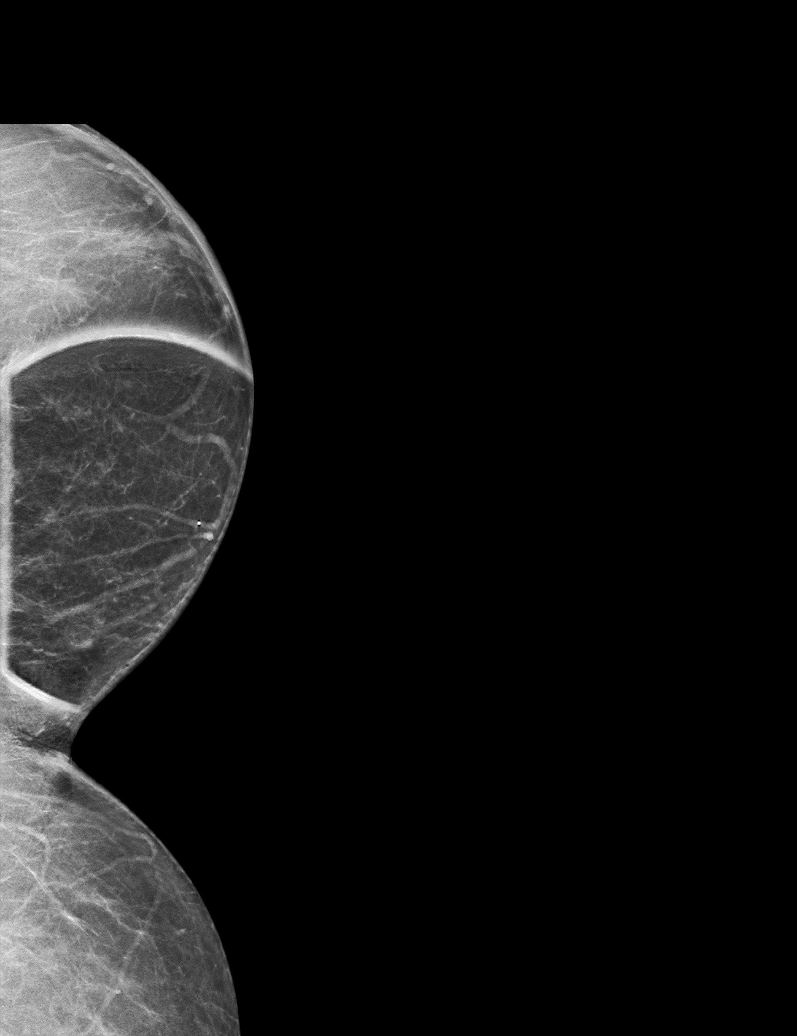

[L CC synth-2D (2 of 2)]
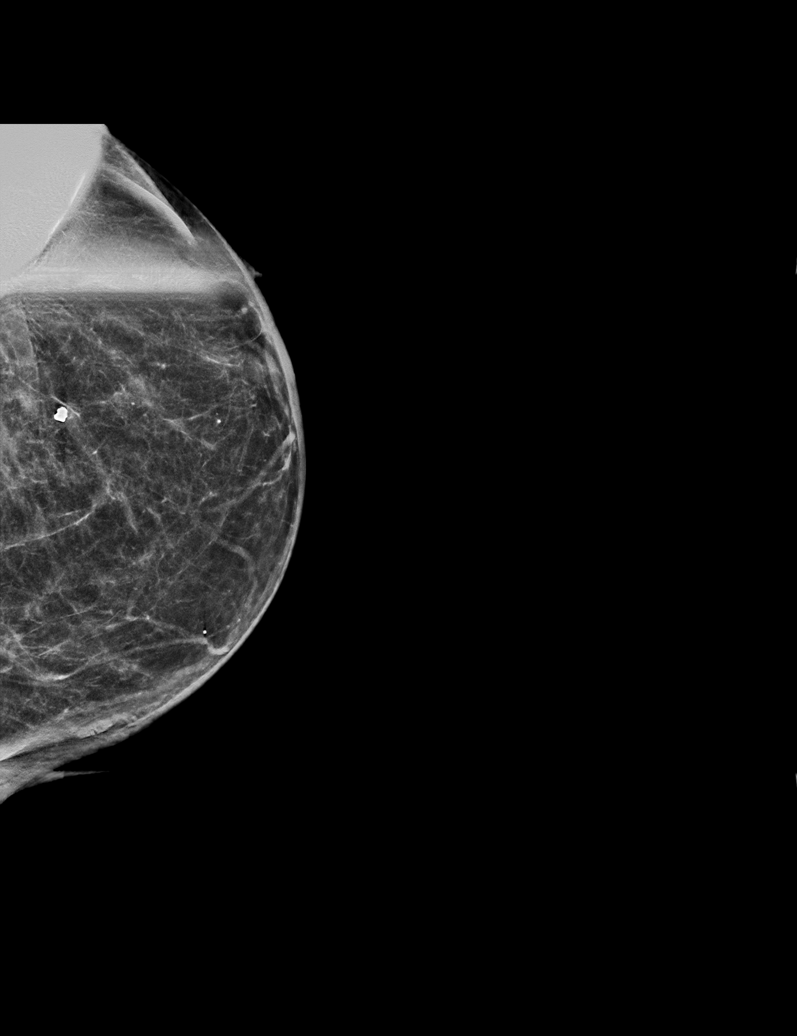

[L ML tomo · tomo slice 30/59.0]
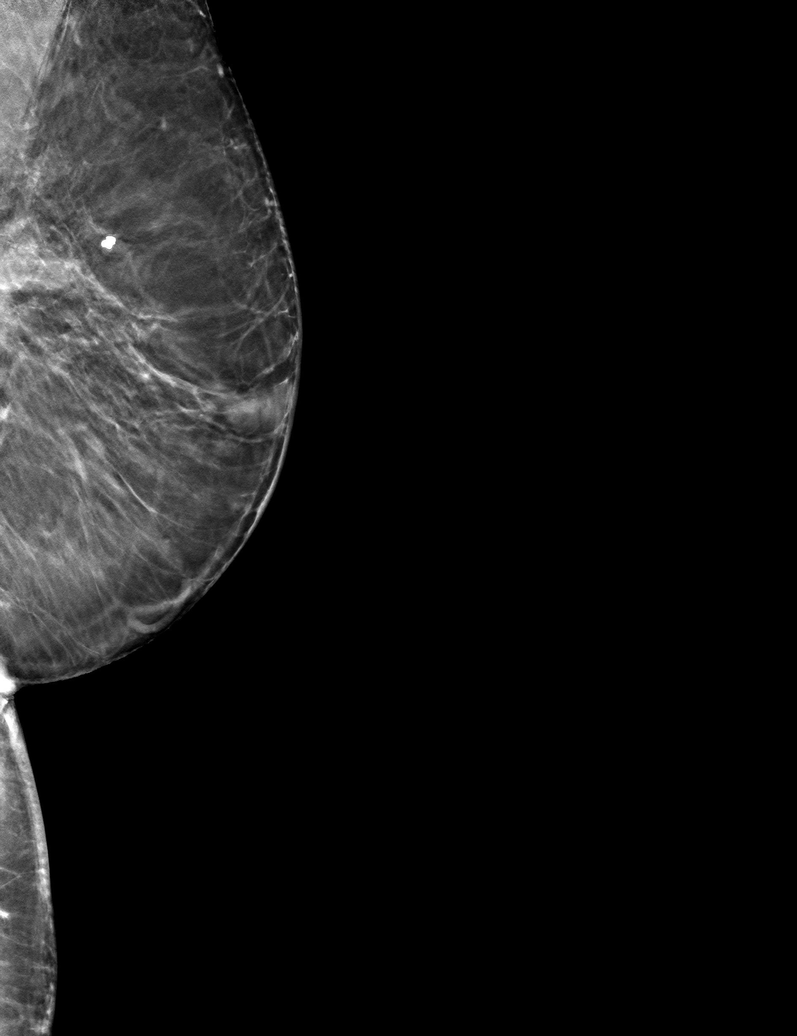

[L CC tomo (1 of 2) · tomo slice 25/48.0]
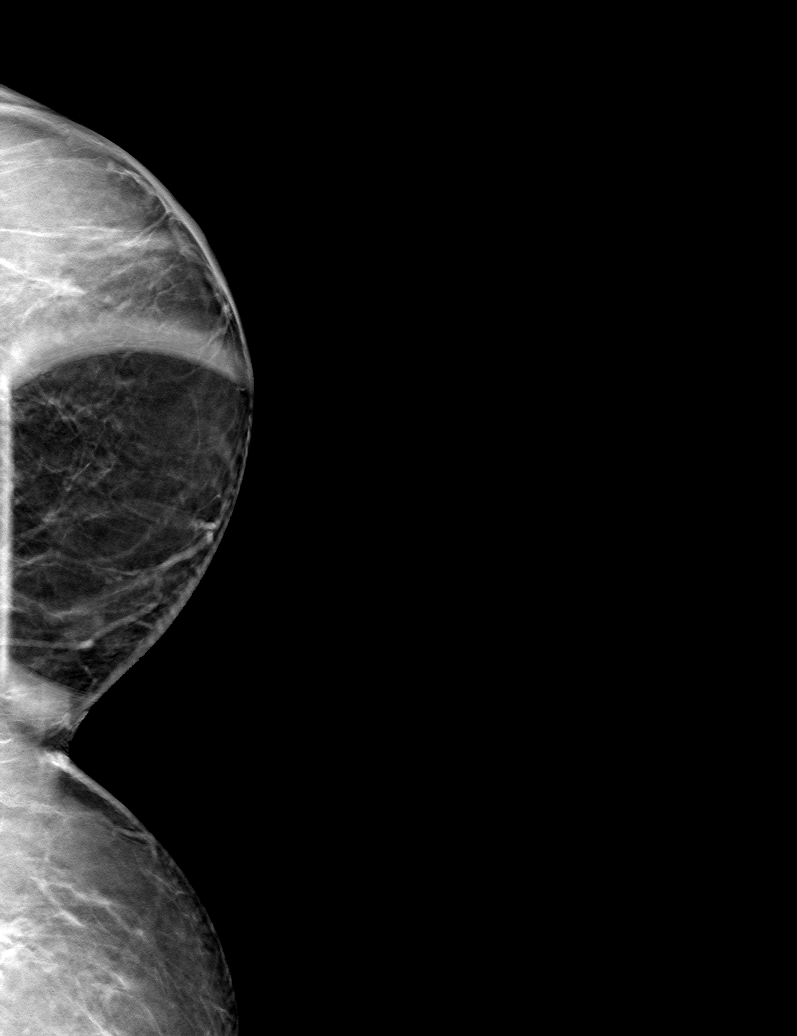

[L CC tomo (2 of 2) · tomo slice 27/53.0]
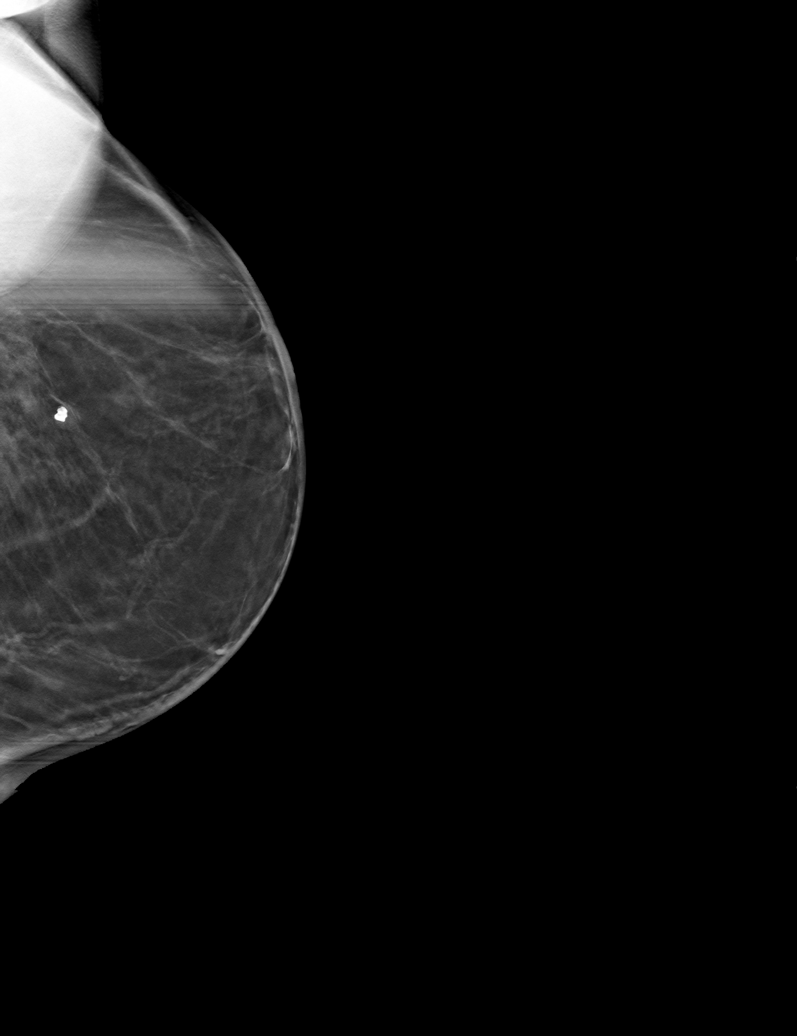

[6 of 18 positions shown; findings below may reference images not displayed]

ACR Breast Density Category b: There are scattered areas of
fibroglandular density.
FINDINGS: Additional tomograms were performed of the left breast. The
initially questioned possible left breast asymmetry resolves on the
additional imaging with findings related to overlapping vascular
structures. There is no mammographic evidence of malignancy in the
left breast.
IMPRESSION: No mammographic evidence of malignancy in the left breast.

RECOMMENDATION:
Screening mammogram in one year.(Code:[7T])

I have discussed the findings and recommendations with the patient.
If applicable, a reminder letter will be sent to the patient
regarding the next appointment.

BI-RADS CATEGORY  1: Negative.

## 2021-08-17 ENCOUNTER — Ambulatory Visit: Payer: Medicare Other | Admitting: Dietician

## 2021-08-18 DIAGNOSIS — H25013 Cortical age-related cataract, bilateral: Secondary | ICD-10-CM | POA: Diagnosis not present

## 2021-08-18 DIAGNOSIS — H04123 Dry eye syndrome of bilateral lacrimal glands: Secondary | ICD-10-CM | POA: Diagnosis not present

## 2021-08-18 DIAGNOSIS — H35363 Drusen (degenerative) of macula, bilateral: Secondary | ICD-10-CM | POA: Diagnosis not present

## 2021-08-18 DIAGNOSIS — H2513 Age-related nuclear cataract, bilateral: Secondary | ICD-10-CM | POA: Diagnosis not present

## 2021-08-23 DIAGNOSIS — E042 Nontoxic multinodular goiter: Secondary | ICD-10-CM | POA: Diagnosis not present

## 2021-09-27 ENCOUNTER — Telehealth: Payer: Self-pay | Admitting: *Deleted

## 2021-09-27 NOTE — Telephone Encounter (Signed)
Patient's husband is calling concerning upcoming appointment. The patient is having left foot pain.

## 2021-10-09 DIAGNOSIS — H35363 Drusen (degenerative) of macula, bilateral: Secondary | ICD-10-CM | POA: Diagnosis not present

## 2021-10-09 DIAGNOSIS — H25013 Cortical age-related cataract, bilateral: Secondary | ICD-10-CM | POA: Diagnosis not present

## 2021-10-09 DIAGNOSIS — H52213 Irregular astigmatism, bilateral: Secondary | ICD-10-CM | POA: Diagnosis not present

## 2021-10-09 DIAGNOSIS — H04123 Dry eye syndrome of bilateral lacrimal glands: Secondary | ICD-10-CM | POA: Diagnosis not present

## 2021-10-09 DIAGNOSIS — H2513 Age-related nuclear cataract, bilateral: Secondary | ICD-10-CM | POA: Diagnosis not present

## 2021-10-24 ENCOUNTER — Ambulatory Visit: Payer: Medicare Other | Admitting: Podiatry

## 2021-10-24 DIAGNOSIS — J849 Interstitial pulmonary disease, unspecified: Secondary | ICD-10-CM | POA: Diagnosis not present

## 2021-10-24 DIAGNOSIS — M85851 Other specified disorders of bone density and structure, right thigh: Secondary | ICD-10-CM | POA: Diagnosis not present

## 2021-10-24 DIAGNOSIS — K219 Gastro-esophageal reflux disease without esophagitis: Secondary | ICD-10-CM | POA: Diagnosis not present

## 2021-10-24 DIAGNOSIS — J439 Emphysema, unspecified: Secondary | ICD-10-CM | POA: Diagnosis not present

## 2021-10-24 DIAGNOSIS — K8681 Exocrine pancreatic insufficiency: Secondary | ICD-10-CM | POA: Diagnosis not present

## 2021-10-24 DIAGNOSIS — I700 Atherosclerosis of aorta: Secondary | ICD-10-CM | POA: Diagnosis not present

## 2021-10-24 DIAGNOSIS — Z853 Personal history of malignant neoplasm of breast: Secondary | ICD-10-CM | POA: Diagnosis not present

## 2021-10-24 DIAGNOSIS — E559 Vitamin D deficiency, unspecified: Secondary | ICD-10-CM | POA: Diagnosis not present

## 2021-10-24 DIAGNOSIS — E7800 Pure hypercholesterolemia, unspecified: Secondary | ICD-10-CM | POA: Diagnosis not present

## 2021-10-24 DIAGNOSIS — N1831 Chronic kidney disease, stage 3a: Secondary | ICD-10-CM | POA: Diagnosis not present

## 2021-10-24 DIAGNOSIS — Z23 Encounter for immunization: Secondary | ICD-10-CM | POA: Diagnosis not present

## 2021-10-24 DIAGNOSIS — Z8673 Personal history of transient ischemic attack (TIA), and cerebral infarction without residual deficits: Secondary | ICD-10-CM | POA: Diagnosis not present

## 2021-11-07 ENCOUNTER — Ambulatory Visit (INDEPENDENT_AMBULATORY_CARE_PROVIDER_SITE_OTHER): Payer: Medicare Other | Admitting: Podiatry

## 2021-11-07 ENCOUNTER — Encounter: Payer: Self-pay | Admitting: Podiatry

## 2021-11-07 ENCOUNTER — Other Ambulatory Visit: Payer: Self-pay

## 2021-11-07 DIAGNOSIS — D2372 Other benign neoplasm of skin of left lower limb, including hip: Secondary | ICD-10-CM | POA: Diagnosis not present

## 2021-11-07 DIAGNOSIS — S9032XA Contusion of left foot, initial encounter: Secondary | ICD-10-CM

## 2021-11-07 NOTE — Progress Notes (Signed)
She presents today complaining of pain to the forefoot left.  States that she is lost a lot of weight due to a pancreatic problem after her hernia repair.  States that she still having pain beneath the forefoot left.  Objective: Pulses are palpable.  Forefoot left demonstrates more of a tyloma now than previous porokeratosis.  Assessment: Benign skin lesions forefoot left.  Fat pad atrophy metatarsalgia.  Plan: Debridement of all reactive hyperkeratotic tissue benign skin lesions and placed padding.

## 2021-11-09 DIAGNOSIS — H04123 Dry eye syndrome of bilateral lacrimal glands: Secondary | ICD-10-CM | POA: Diagnosis not present

## 2021-11-09 DIAGNOSIS — H25013 Cortical age-related cataract, bilateral: Secondary | ICD-10-CM | POA: Diagnosis not present

## 2021-11-09 DIAGNOSIS — H0102A Squamous blepharitis right eye, upper and lower eyelids: Secondary | ICD-10-CM | POA: Diagnosis not present

## 2021-11-09 DIAGNOSIS — H2513 Age-related nuclear cataract, bilateral: Secondary | ICD-10-CM | POA: Diagnosis not present

## 2021-11-27 DIAGNOSIS — Z23 Encounter for immunization: Secondary | ICD-10-CM | POA: Diagnosis not present

## 2022-01-01 ENCOUNTER — Other Ambulatory Visit: Payer: Self-pay

## 2022-01-01 ENCOUNTER — Other Ambulatory Visit: Payer: Self-pay | Admitting: Gastroenterology

## 2022-01-01 DIAGNOSIS — K8689 Other specified diseases of pancreas: Secondary | ICD-10-CM | POA: Diagnosis not present

## 2022-01-01 DIAGNOSIS — R109 Unspecified abdominal pain: Secondary | ICD-10-CM | POA: Diagnosis not present

## 2022-01-02 DIAGNOSIS — K8689 Other specified diseases of pancreas: Secondary | ICD-10-CM | POA: Diagnosis not present

## 2022-01-10 DIAGNOSIS — H04123 Dry eye syndrome of bilateral lacrimal glands: Secondary | ICD-10-CM | POA: Diagnosis not present

## 2022-01-10 DIAGNOSIS — H2513 Age-related nuclear cataract, bilateral: Secondary | ICD-10-CM | POA: Diagnosis not present

## 2022-01-10 DIAGNOSIS — H35363 Drusen (degenerative) of macula, bilateral: Secondary | ICD-10-CM | POA: Diagnosis not present

## 2022-01-10 DIAGNOSIS — H25013 Cortical age-related cataract, bilateral: Secondary | ICD-10-CM | POA: Diagnosis not present

## 2022-01-23 ENCOUNTER — Other Ambulatory Visit: Payer: Self-pay

## 2022-01-23 ENCOUNTER — Ambulatory Visit
Admission: RE | Admit: 2022-01-23 | Discharge: 2022-01-23 | Disposition: A | Discharge status: Home / Self Care | Payer: Medicare Other | Source: Ambulatory Visit | Attending: Gastroenterology | Admitting: Gastroenterology

## 2022-01-23 DIAGNOSIS — R109 Unspecified abdominal pain: Secondary | ICD-10-CM

## 2022-01-23 DIAGNOSIS — K7689 Other specified diseases of liver: Secondary | ICD-10-CM | POA: Diagnosis not present

## 2022-01-23 DIAGNOSIS — K469 Unspecified abdominal hernia without obstruction or gangrene: Secondary | ICD-10-CM | POA: Diagnosis not present

## 2022-01-23 DIAGNOSIS — Z853 Personal history of malignant neoplasm of breast: Secondary | ICD-10-CM | POA: Diagnosis not present

## 2022-01-23 DIAGNOSIS — D259 Leiomyoma of uterus, unspecified: Secondary | ICD-10-CM | POA: Diagnosis not present

## 2022-01-23 IMAGING — CT CT ABD-PELV W/ CM
1 of 3 series · 11 of 32 positions shown, 17 images · IV contrast (APPLIED)
Comparison: CT abdomen pelvis [DATE]

CLINICAL DATA: LUQ PAIN, GENERALIZED ABD PAIN x2 MONTHS HX OF LT
BREAST CANCER WITH RADIATION AND LUMPECTOMY SX-CHOLECYSTECTOMY,
HERNIA, APPENDECTOMY

EXAM:
CT ABDOMEN AND PELVIS WITH CONTRAST
TECHNIQUE: Multidetector CT imaging of the abdomen and pelvis was performed
using the standard protocol following bolus administration of
intravenous contrast.

[Series 2: abd/pelvis w/cm · axial · 0.72mm/px · z∈[-459,-74]mm · 11 of 93 slices shown, 17 images]
[im 8/93  soft-tissue]
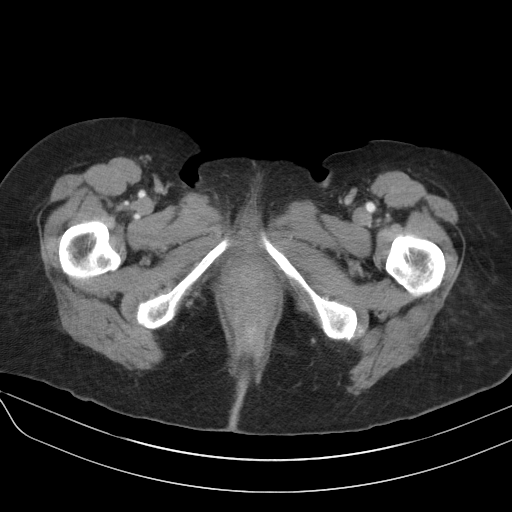
[im 8/93  bone]
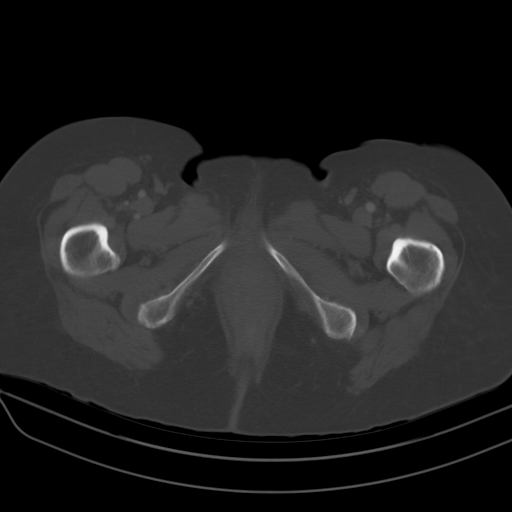
[im 16/93  soft-tissue]
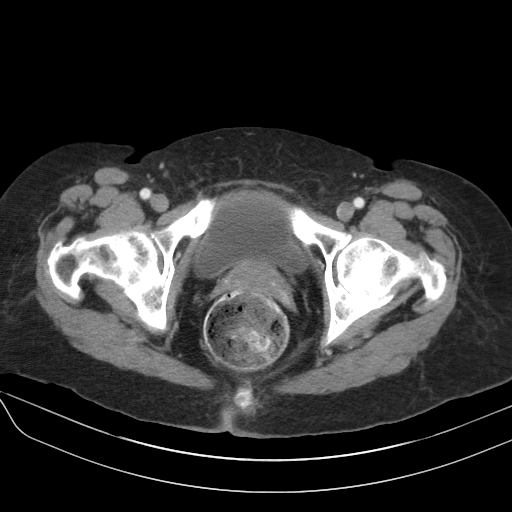
[im 24/93  soft-tissue]
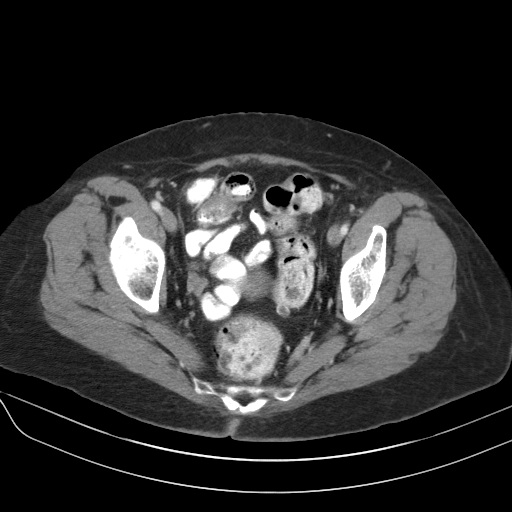
[im 31/93  soft-tissue]
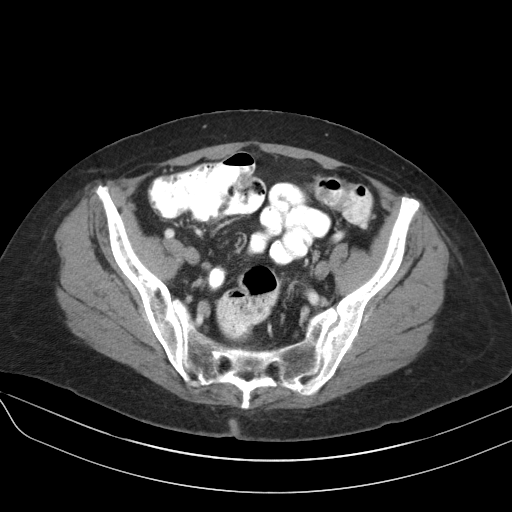
[im 39/93  soft-tissue]
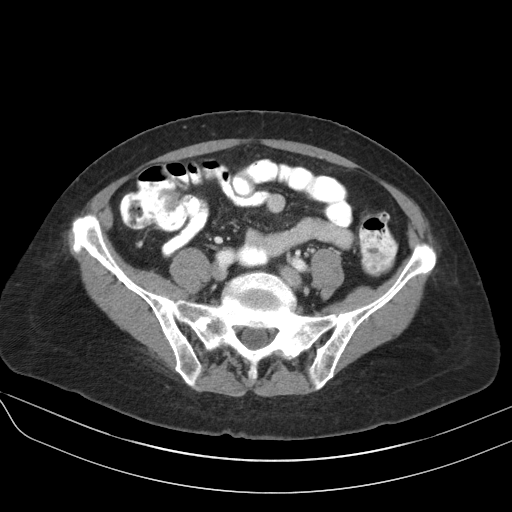
[im 47/93  soft-tissue]
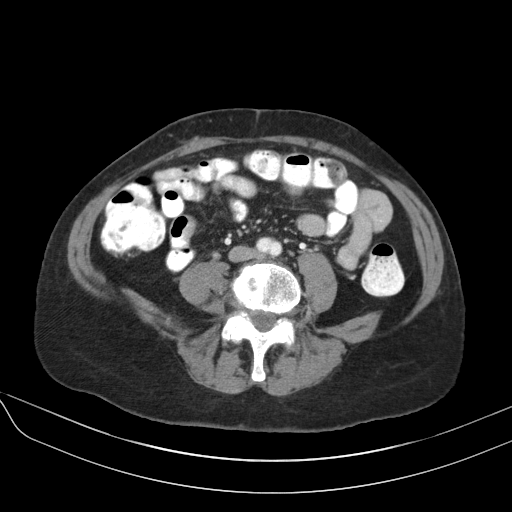
[im 54/93  soft-tissue]
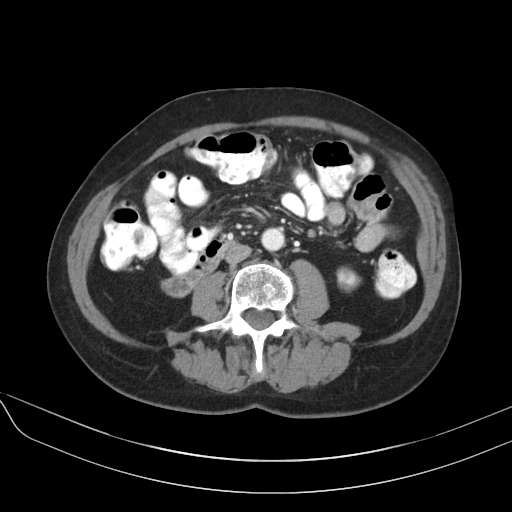
[im 62/93  soft-tissue]
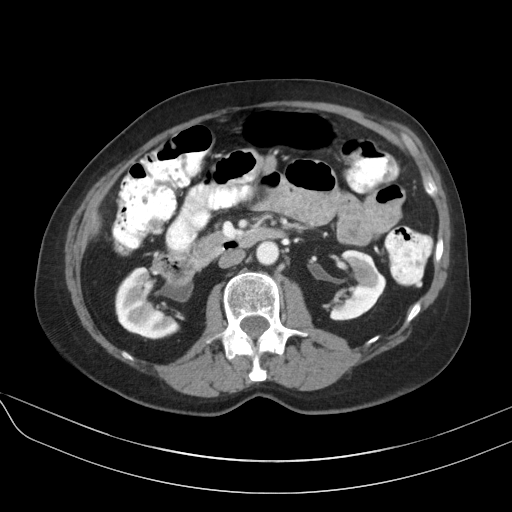
[im 62/93  lung]
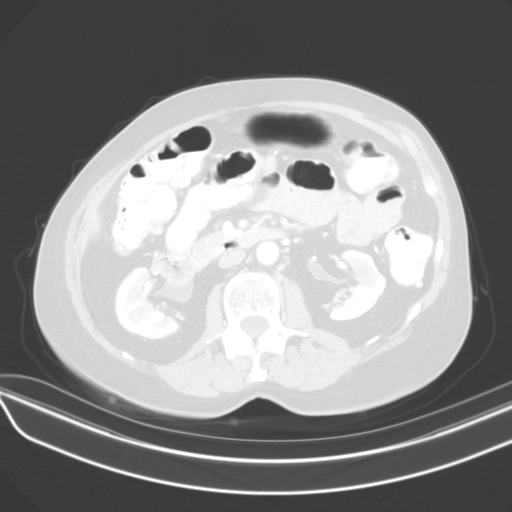
[im 70/93  soft-tissue]
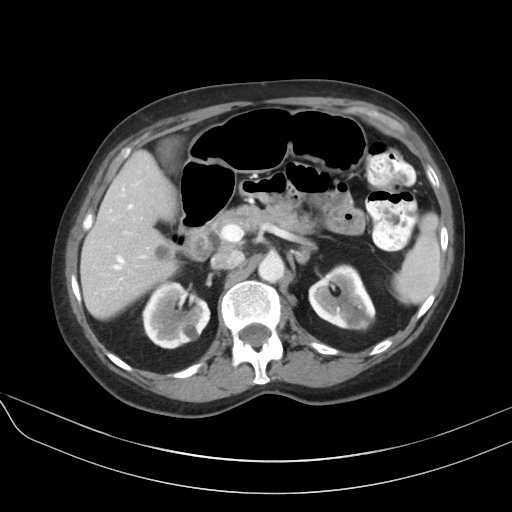
[im 70/93  lung]
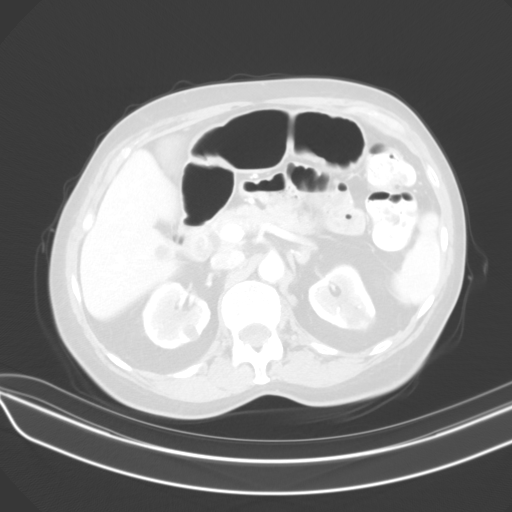
[im 70/93  bone]
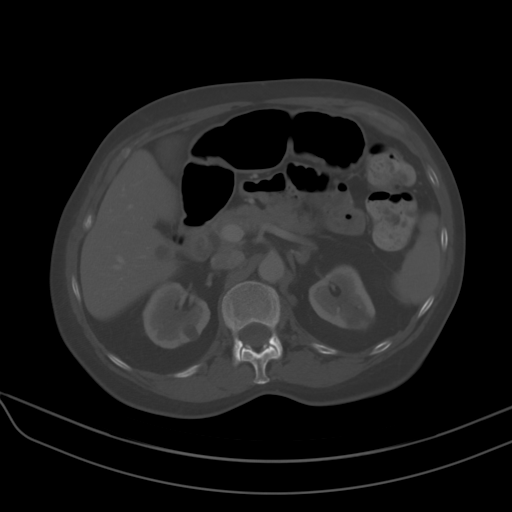
[im 77/93  soft-tissue]
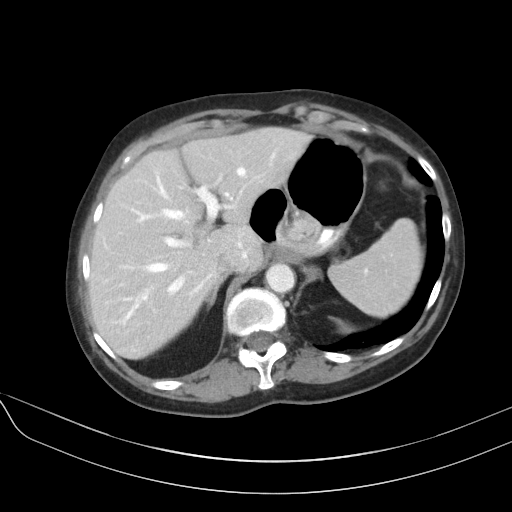
[im 77/93  lung]
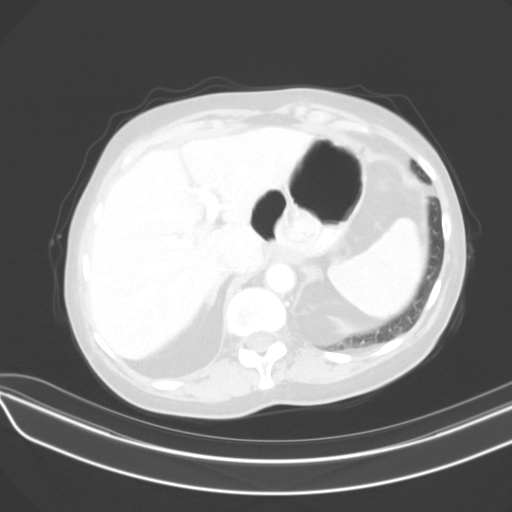
[im 85/93  soft-tissue]
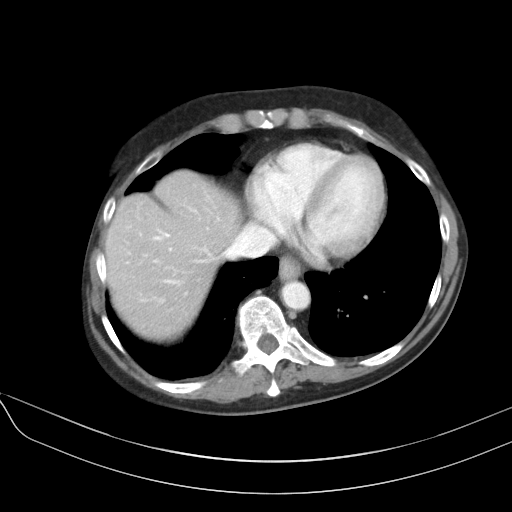
[im 85/93  lung]
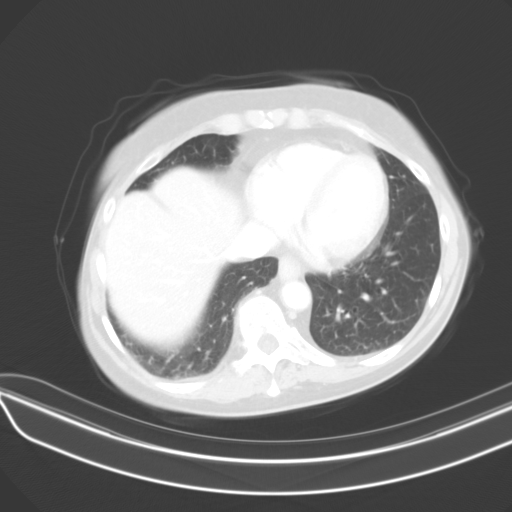

[11 of 32 positions shown; findings below may reference images not displayed]

RADIATION DOSE REDUCTION: This exam was performed according to the
departmental dose-optimization program which includes automated
exposure control, adjustment of the mA and/or kV according to
patient size and/or use of iterative reconstruction technique.

CONTRAST:  100mL [LE] IOPAMIDOL ([LE]) INJECTION 61%
FINDINGS: Lower chest: No acute abnormality. Persistent small fat containing
Bochdalek's hernia.

Hepatobiliary: There is a lobulated stable in size 2 x 2.1 cm left
hepatic lobe fluid density likely representing a hepatic cyst.
Another fluid dense lesion is noted within the right hepatic lobe
and likely represents a simple hepatic cyst ([DATE]). Status post
cholecystectomy. No biliary dilatation.

Pancreas: No focal lesion. Normal pancreatic contour. No surrounding
inflammatory changes. No main pancreatic ductal dilatation.

Spleen: Normal in size without focal abnormality.

Adrenals/Urinary Tract:

No adrenal nodule bilaterally.

Bilateral kidneys enhance symmetrically. Bilateral renal cortical
scarring. Similar-appearing several fluid density lesions within the
kidneys likely represent simple renal cysts with the largest
measuring 2.6 cm on the left. Subcentimeter hypodensities are too
small to characterize.

No hydronephrosis. No hydroureter.

The urinary bladder is unremarkable.

On delayed imaging, there is no urothelial wall thickening and there
are no filling defects in the opacified portions of the bilateral
collecting systems or ureters.

Stomach/Bowel: Surgical changes related to a paraesophageal hernia
repair with fundoplication gastropexy. PO contrast reaches the
rectum. Stomach is within normal limits. No evidence of bowel wall
thickening or dilatation. Status post appendectomy.

Vascular/Lymphatic: No abdominal aorta or iliac aneurysm. Mild
atherosclerotic plaque of the aorta and its branches. No abdominal,
pelvic, or inguinal lymphadenopathy.

Reproductive: Several hyperdense rounded uterine lesions likely
represent fibroids. Uterus and bilateral adnexa are unremarkable.

Other: No intraperitoneal free fluid. No intraperitoneal free gas.
No organized fluid collection.

Musculoskeletal:

No abdominal wall hernia or abnormality.

Several scattered densely sclerotic lesions within the bones likely
represent bone islands. No suspicious lytic or blastic osseous
lesions. No acute displaced fracture. Multilevel degenerative
changes of the spine.
IMPRESSION: 1. No acute intra-abdominal or intrapelvic abnormality in a patient
status post fundoplication and gastropexy as well as appendectomy.
2. Uterine fibroids.
3.  Aortic Atherosclerosis ([LE]-[LE]).

## 2022-01-23 MED ORDER — IOPAMIDOL (ISOVUE-300) INJECTION 61%
100.0000 mL | Freq: Once | INTRAVENOUS | Status: AC | PRN
  Administered 2022-01-23: 100 mL via INTRAVENOUS

## 2022-02-12 ENCOUNTER — Ambulatory Visit (INDEPENDENT_AMBULATORY_CARE_PROVIDER_SITE_OTHER): Payer: Medicare Other | Admitting: Podiatry

## 2022-02-12 ENCOUNTER — Other Ambulatory Visit: Payer: Self-pay

## 2022-02-12 DIAGNOSIS — M21372 Foot drop, left foot: Secondary | ICD-10-CM

## 2022-02-12 NOTE — Progress Notes (Signed)
° °  HPI: 76 y.o. female presenting today with a new complaint regarding the inability to lift her left foot up.  Patient does relate a history of stroke creating left-sided weakness.  She says that she trips and falls several times because of her inability to lift her foot up when walking.  She is concerned for dropfoot.  She says that she was talking to her sister who is a retired Marine scientist and recommended she be evaluated for dropfoot.  She does not have any pain associated to the foot.  She presents for further treatment and evaluation  Past Medical History:  Diagnosis Date   Allergic rhinitis    Arthritis    Cancer ([place])    Breast- left   CKD (chronic kidney disease)    stage III   Colon polyp    Dyspnea    GERD (gastroesophageal reflux disease)    History of hiatal hernia    Hyperlipidemia    Personal history of radiation therapy    Stroke [facility])    TIA before 2013   TIA (transient ischemic attack)    Vitamin D deficiency     Past Surgical History:  Procedure Laterality Date   APPENDECTOMY     BREAST LUMPECTOMY Left 2006   BREAST SURGERY     2006...treated with Tamoxifen for 6 years   CHOLECYSTECTOMY     COLONOSCOPY     ESOPHAGOGASTRODUODENOSCOPY N/A 09/01/2020   Procedure: ESOPHAGOGASTRODUODENOSCOPY (EGD);  Surgeon: Lajuana Matte, MD;  Location: [place];  Service: Thoracic;  Laterality: N/A;   XI ROBOTIC ASSISTED PARAESOPHAGEAL HERNIA REPAIR N/A 09/01/2020   Procedure: XI ROBOTIC ASSISTED HIATAL HERNIA REPAIR WITH FUNDOPLICATION AND GASTROPEXY;  Surgeon: Lajuana Matte, MD;  Location: [place];  Service: Thoracic;  Laterality: N/A;    No Known Allergies   Physical Exam: General: The patient is alert and oriented x3 in no acute distress.  Dermatology: Skin is warm, dry and supple bilateral lower extremities. Negative for open lesions or macerations.  Vascular: Palpable pedal pulses bilaterally. Capillary refill within normal limits.  Negative for any significant edema  or erythema  Neurological: Light touch and protective threshold grossly intact  Musculoskeletal Exam: No pedal deformities noted.  Negative for any significant pain to the foot or ankle.  Loss of ankle joint dorsiflexion and eversion noted consistent with dropfoot deformity.    Assessment: 1.  Dropfoot left lower extremity   Plan of Care:  1. Patient evaluated.  2.  Appointment with Pedorthist for possible AFO bracing 3.  Recommend walking with a cane or walker in the meantime to avoid falls 4.  Return to clinic 6 months      Edrick Kins, DPM [facility]  Dr. Edrick Kins, DPM    [address], [place] [address]                Office [phone]  Fax [phone]

## 2022-02-13 ENCOUNTER — Telehealth: Payer: Self-pay

## 2022-02-13 ENCOUNTER — Ambulatory Visit: Payer: Medicare Other

## 2022-02-13 ENCOUNTER — Other Ambulatory Visit: Payer: Medicare Other

## 2022-02-13 DIAGNOSIS — M21372 Foot drop, left foot: Secondary | ICD-10-CM

## 2022-02-13 NOTE — Progress Notes (Signed)
SITUATION Patient Name:  Yvonne Jordan MRN:   [id number] Reason for Visit: Evaluation for Speciality AFO  Patient Report: Chief Complaint:   Foot drop Petra Kuba of Discomfort/Pain:  Ambulatory Location:    left lower extremity Onset & Duration:   Sudden Course:    unchanged Aggravating or Alleviating Factors: Walking  OBJECTIVE DATA & MEASUREMENTS Prognosis:    Good Duration of use:   5 years  Diagnosis:   ICD-10-CM   1. Left foot drop  M21.372       Patient Weight:   Shoe Size:   45M  GOALS, NECESSITIES, & JUSTIFICATIONS Recommended Device: [place] WalkOn Trimmable Size:    Small Side:    left  Laterality HCPCS Code Description Justification  left L1951 Ankle foot orthosis (AFO), spiral, ([facility] type), plastic or other material, prefabricated, includes fitting and adjustment Necessary to prevent foot drop during ambulation in order to prevent tripping and injury.    I certify that Yvonne Jordan qualifies for and will benefit from an ankle foot orthosis used during ambulation based on meeting all of the following criteria;   The patient is: - Ambulatory, and - Has weakness or deformity of the foot and ankle, and - Requires stabilization for medical reasons, and - Has the potential to benefit functionally  The patients medical record contains sufficient documentation of the patients medical condition to substantiate the necessity for the type and quantity of the items ordered.  The goals of this therapy: - Improve Mobility - Improve Lower Extremity Stability - Decrease Pain  I hereby certify that the ankle foot orthotic described above is a rigid or semi-rigid device which is used for the purpose of supporting a weak or deformed body member or restricting or eliminating motion in a diseased or injured part of the body. It is designed to provide support and counterforce on the limb or body part that is being braced. In my opinion,  the ankle foot orthosis reasonable and necessary in reference to accepted standards of medical practice in the treatment  of the patient condition and rehabilitation.  ACTIONS PERFORMED Patient was evaluated and measured for Prefabricated Carbon Spiral AFO. Procedure was explained to patient. Patient tolerated procedure. patient selected device color and closure method.   PLAN Patient to return in four to six weeks for fitting and delivery of device. Plan of care was explained to and agreed upon by patient. All questions were answered and concerns addressed.

## 2022-02-13 NOTE — Telephone Encounter (Signed)
Ordered Metallurgist

## 2022-03-02 ENCOUNTER — Other Ambulatory Visit: Payer: Self-pay

## 2022-03-02 ENCOUNTER — Ambulatory Visit (INDEPENDENT_AMBULATORY_CARE_PROVIDER_SITE_OTHER): Payer: Medicare Other

## 2022-03-02 DIAGNOSIS — M21372 Foot drop, left foot: Secondary | ICD-10-CM

## 2022-03-02 NOTE — Progress Notes (Signed)
SITUATION: ?Reason for Visit: Fitting and Delivery of Prefab Carbon Spiral AFO ?Patient Report: Patient reports comfort and is satisfied with device. ? ?OBJECTIVE DATA: ?Patient History / Diagnosis:  ?  ICD-10-CM   ?1. Left foot drop  M21.372   ?  ? ? ?Provided Device: Thomas Hoff [id number] Small Left ? ?GOAL OF ORTHOSIS ?- Improve gait ?- Decrease energy expenditure ?- Improve Balance ?- Compensate for muscle weakness ?- Facilitate motion ? ?ACTIONS PERFORMED ?Patient was fit with [place] WalkOn Spiral AFO trimmed to shoe last. Patient tolerated fittign procedure. Device was modified as follows to better fit patient: ?- Toe plate was trimmed to shoe last ?- Strap was trimmed to appropriate length ? ?Patient was provided with verbal and written instruction and demonstration regarding donning, doffing, wear, care, proper fit, function, purpose, cleaning, and use of the orthosis and in all related precautions and risks and benefits regarding the orthosis. ? ?Patient was also provided with verbal instruction regarding how to report any failures or malfunctions of the orthosis and necessary follow up care. Patient was also instructed to contact our office regarding any change in status that may affect the function of the orthosis. ? ?Patient demonstrated independence with proper donning, doffing, and fit and verbalized understanding of all instructions. ? ?PLAN: ?Patient is to follow up in one week or as necessary (PRN). All questions were answered and concerns addressed. Plan of care was discussed with and agreed upon by the patient. ? ?

## 2022-03-11 ENCOUNTER — Other Ambulatory Visit: Payer: Self-pay

## 2022-03-11 ENCOUNTER — Emergency Department (HOSPITAL_COMMUNITY): Payer: Medicare Other

## 2022-03-11 ENCOUNTER — Encounter (HOSPITAL_COMMUNITY): Payer: Self-pay | Admitting: Emergency Medicine

## 2022-03-11 ENCOUNTER — Emergency Department (HOSPITAL_COMMUNITY)
Admission: EM | Admit: 2022-03-11 | Discharge: 2022-03-11 | Disposition: A | Discharge status: Home / Self Care | Payer: Medicare Other | Attending: Emergency Medicine | Admitting: Emergency Medicine

## 2022-03-11 DIAGNOSIS — S199XXA Unspecified injury of neck, initial encounter: Secondary | ICD-10-CM | POA: Diagnosis not present

## 2022-03-11 DIAGNOSIS — S0990XA Unspecified injury of head, initial encounter: Secondary | ICD-10-CM | POA: Diagnosis present

## 2022-03-11 DIAGNOSIS — R9431 Abnormal electrocardiogram [ECG] [EKG]: Secondary | ICD-10-CM | POA: Diagnosis not present

## 2022-03-11 DIAGNOSIS — Z7902 Long term (current) use of antithrombotics/antiplatelets: Secondary | ICD-10-CM | POA: Diagnosis not present

## 2022-03-11 DIAGNOSIS — S0101XA Laceration without foreign body of scalp, initial encounter: Secondary | ICD-10-CM | POA: Diagnosis not present

## 2022-03-11 DIAGNOSIS — W01198A Fall on same level from slipping, tripping and stumbling with subsequent striking against other object, initial encounter: Secondary | ICD-10-CM | POA: Insufficient documentation

## 2022-03-11 IMAGING — CT CT CERVICAL SPINE W/O CM
3 of 4 series · 12 of 33 positions shown, 14 images · non-contrast
Comparison: None.

CLINICAL DATA: Neck trauma (Age >= 65y)

Fall on Plavix.
EXAM:
CT CERVICAL SPINE WITHOUT CONTRAST
TECHNIQUE: Multidetector CT imaging of the cervical spine was performed without
intravenous contrast. Multiplanar CT image reconstructions were also
generated.
RADIATION DOSE REDUCTION: This exam was performed according to the
departmental dose-optimization program which includes automated
exposure control, adjustment of the mA and/or kV according to
patient size and/or use of iterative reconstruction technique.

[Series 8: sag bone · sagittal · 0.22mm/px · 5 of 73 slices shown, 6 images]
[im 25/73  bone]
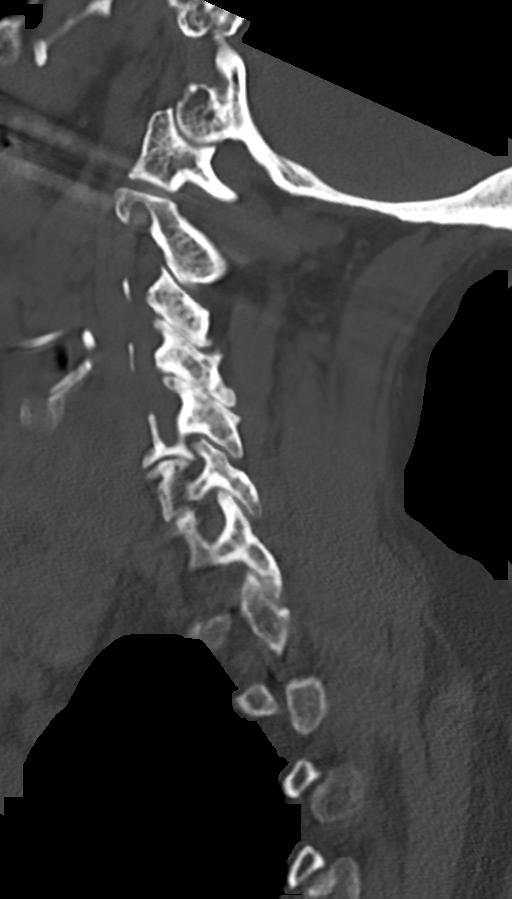
[im 31/73  bone]
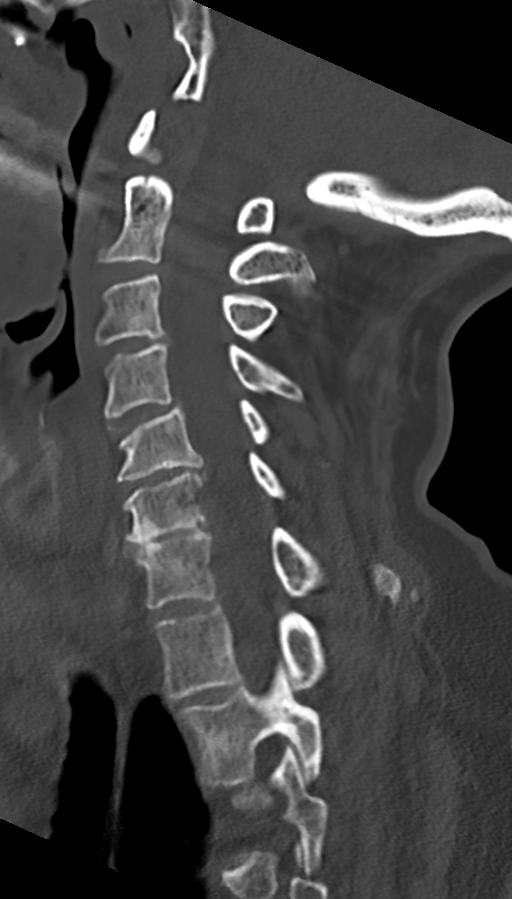
[im 37/73  soft-tissue]
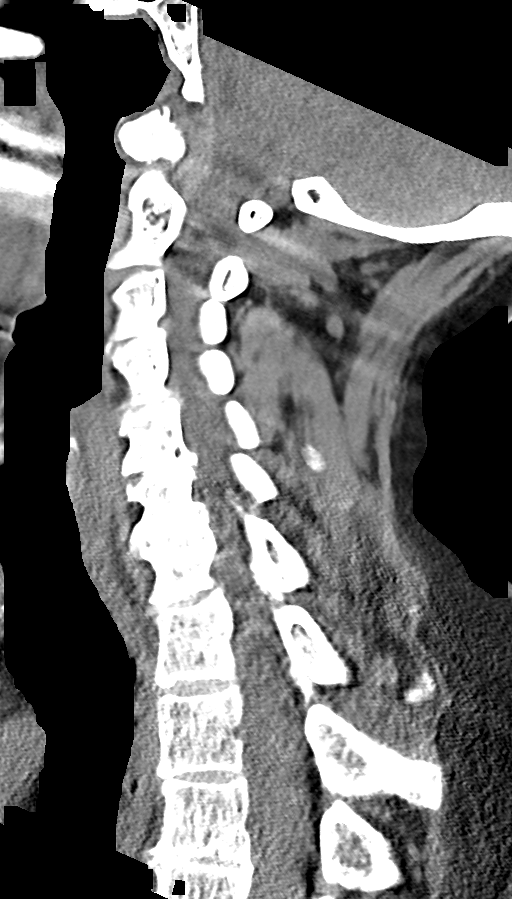
[im 37/73  bone]
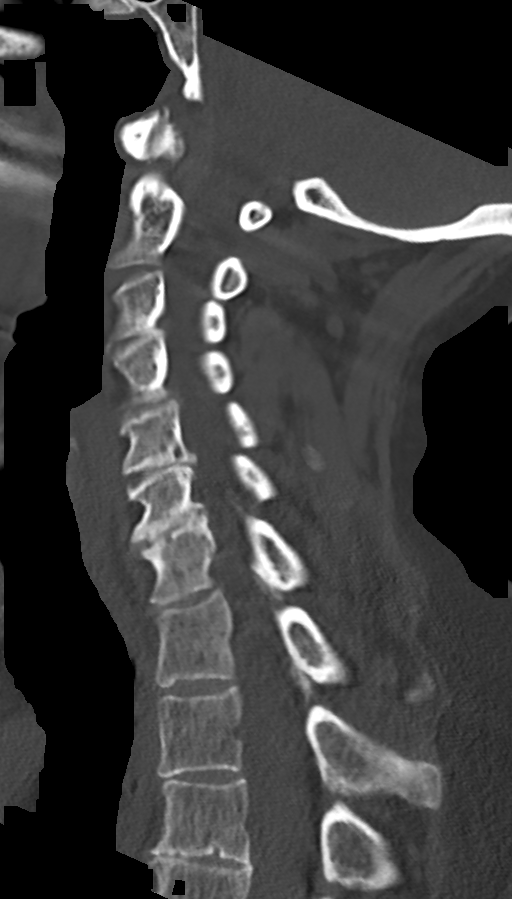
[im 43/73  bone]
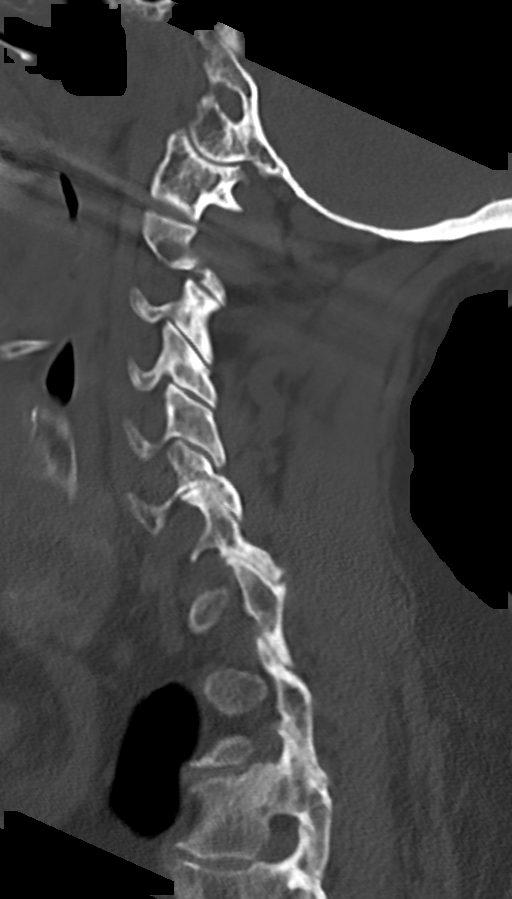
[im 49/73  bone]
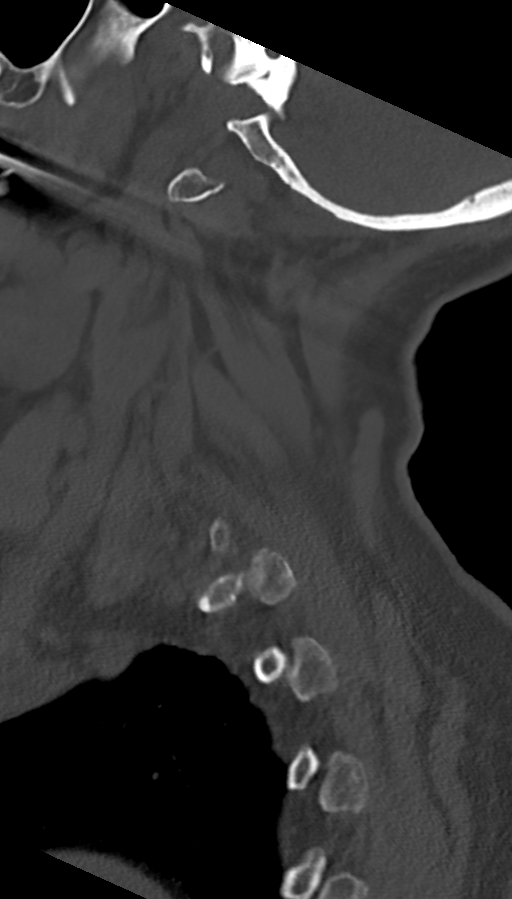

[Series 9: cor bone · coronal · 0.30mm/px · 3 of 75 slices shown]
[im 26/75  bone]
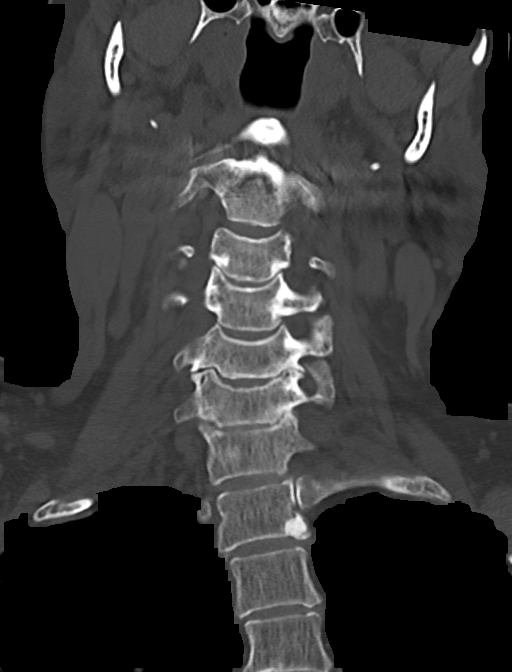
[im 35/75  bone]
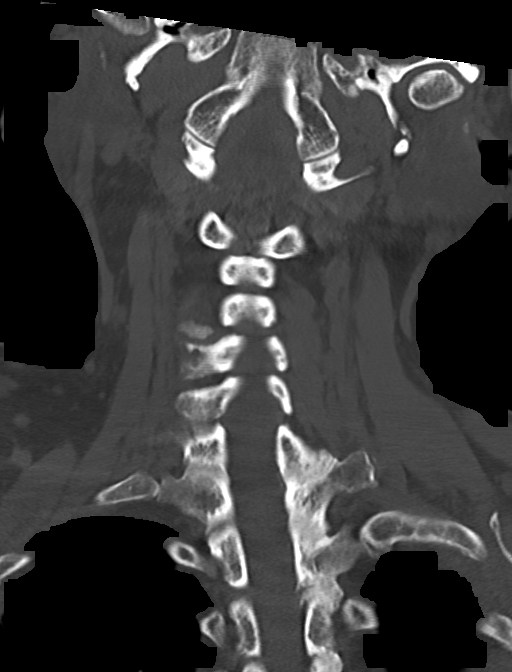
[im 45/75  bone]
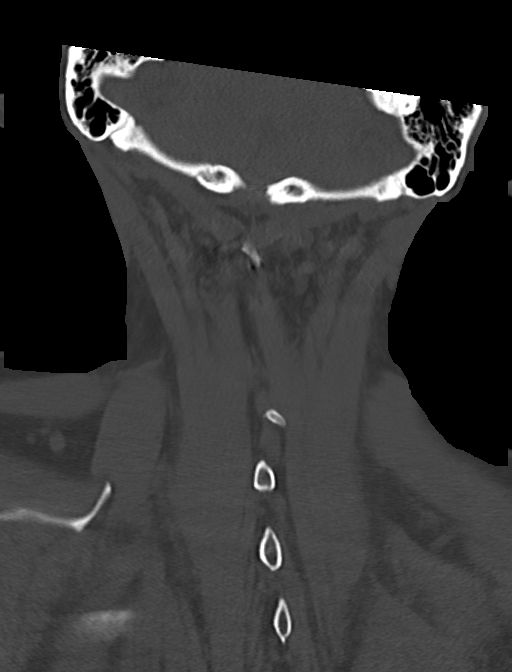

[Series 10: orthogonal axials · axial · 0.21mm/px · z∈[-283,-174]mm · 4 of 92 slices shown, 5 images]
[im 16/92  soft-tissue]
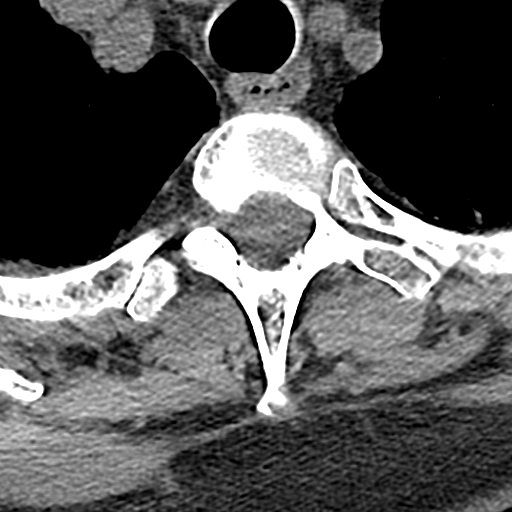
[im 16/92  bone]
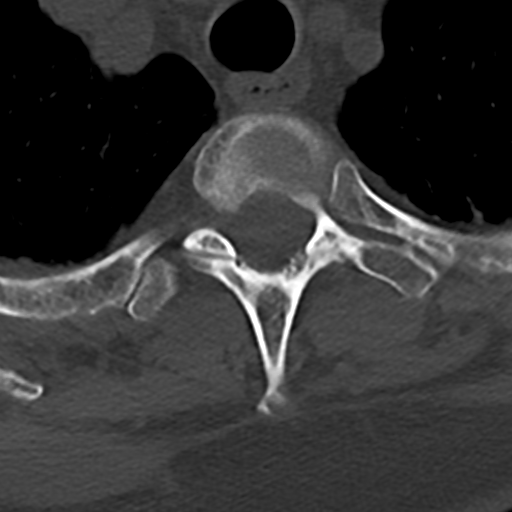
[im 31/92  bone]
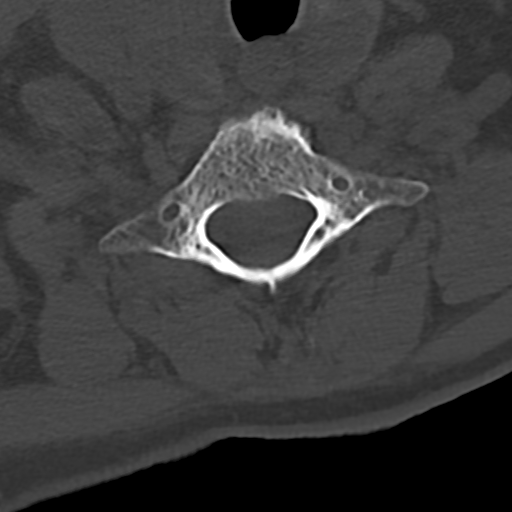
[im 61/92  bone]
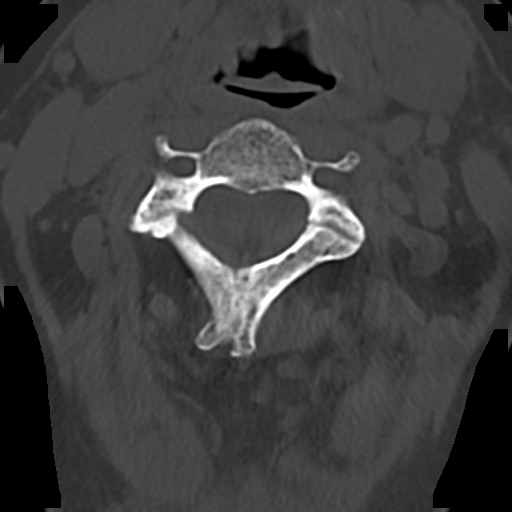
[im 76/92  bone]
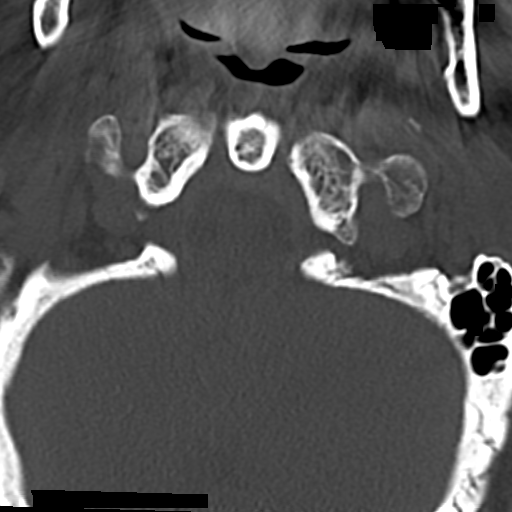

[12 of 33 positions shown; findings below may reference images not displayed]

FINDINGS: Alignment: No traumatic subluxation. Trace anterolisthesis of C4 on
C5. there is mild broad-based rightward curvature.

Skull base and vertebrae: No acute fracture. Vertebral body heights
are maintained. The dens and skull base are intact. Incidental bone
island within T1 vertebral body.

Soft tissues and spinal canal: No prevertebral fluid or swelling. No
visible canal hematoma.

Disc levels: Diffuse degenerative disc disease with disc space
narrowing and endplate spurring, as well as partially calcified disc
osteophyte complex at C4-C5. Multilevel facet hypertrophy. There is
no high-grade canal stenosis

Upper chest: No acute apical findings heterogeneous enlarged thyroid
gland. This has been evaluated on previous imaging. (ref: [HOSPITAL]. [DATE]): 143-50).Thyroid ultrasound [DATE]

Other: None.
IMPRESSION: Multilevel degenerative disc disease and facet hypertrophy. No acute
fracture or subluxation.

## 2022-03-11 IMAGING — CT CT HEAD W/O CM
4 series · 17 of 47 positions shown, 19 images · non-contrast
Comparison: None.

CLINICAL DATA: Head trauma, minor (Age >= 65y) Fall on plavix



[Series 3: head wo · axial · 0.44mm/px · z∈[-141,-21]mm · 7 of 34 slices shown, 9 images]
[im 5/34  brain]
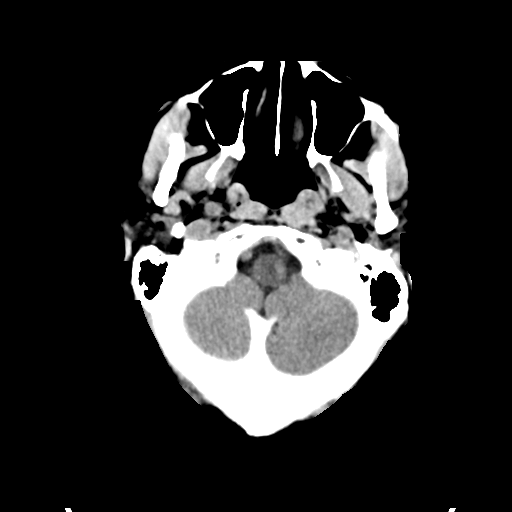
[im 5/34  bone]
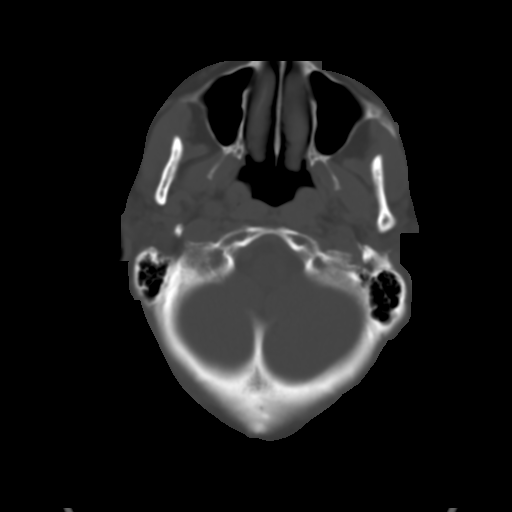
[im 9/34  brain]
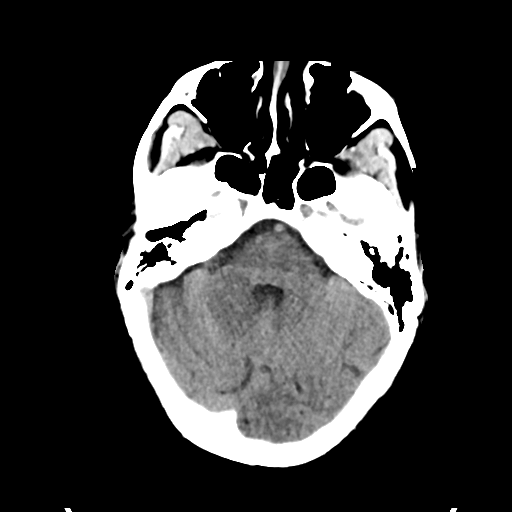
[im 13/34  brain]
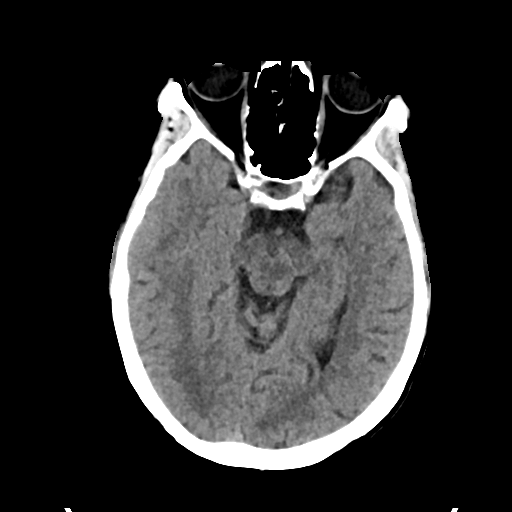
[im 17/34  brain]
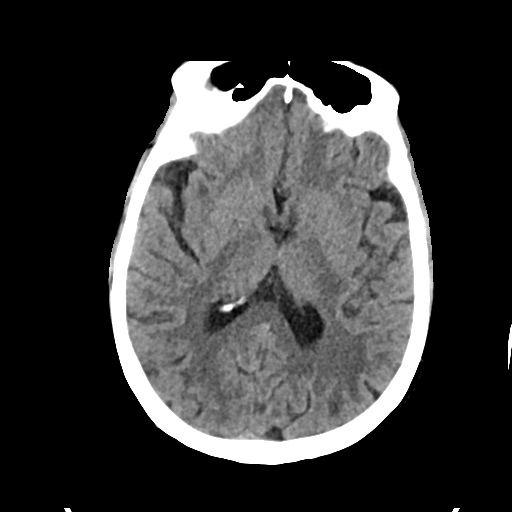
[im 21/34  brain]
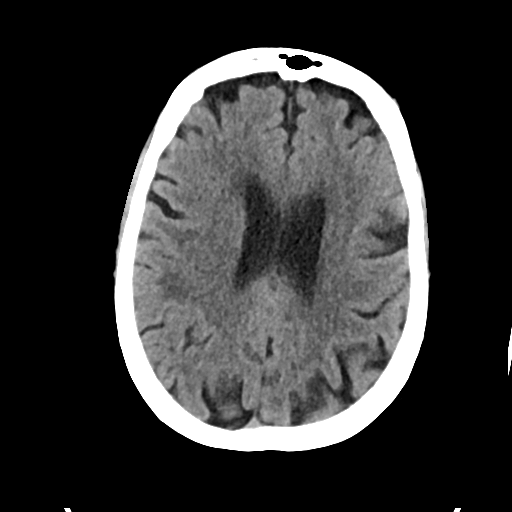
[im 21/34  bone]
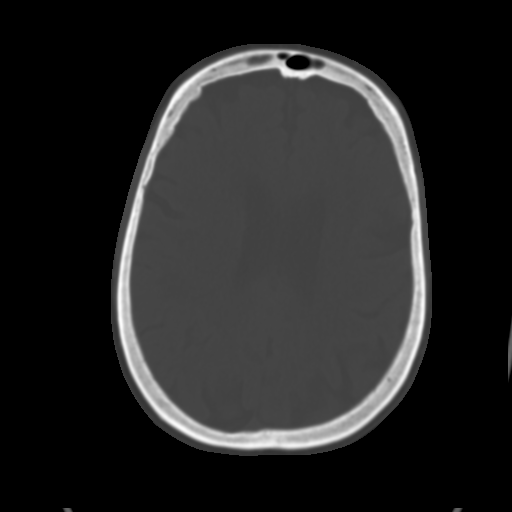
[im 25/34  brain]
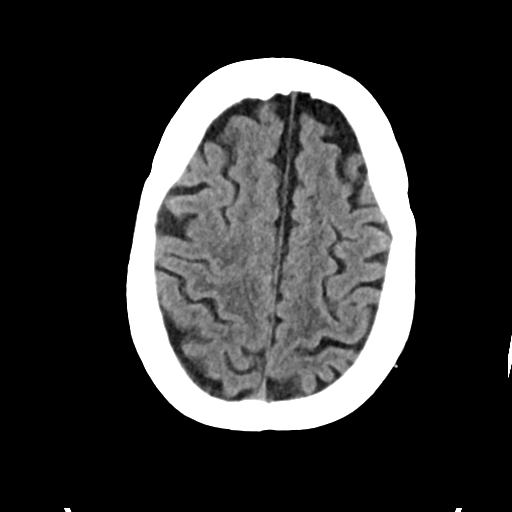
[im 29/34  brain]
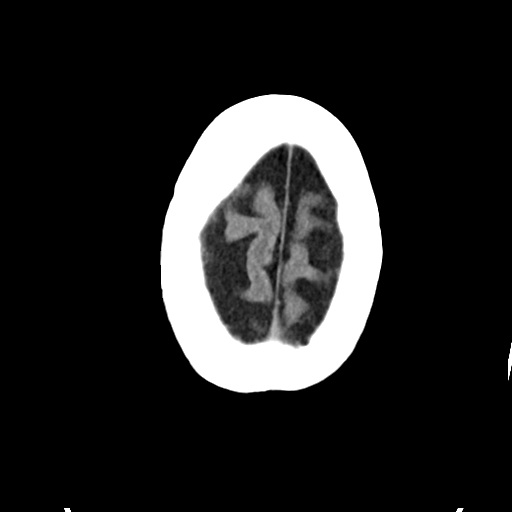

[Series 4: head bone · axial · 0.44mm/px · z∈[-145,-87]mm · 4 of 85 slices shown]
[im 9/85  bone]
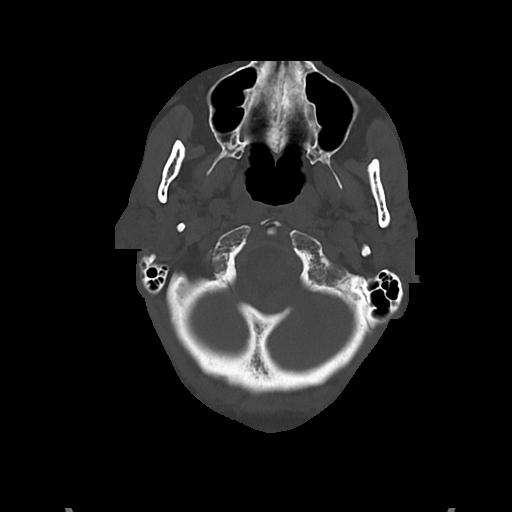
[im 17/85  bone]
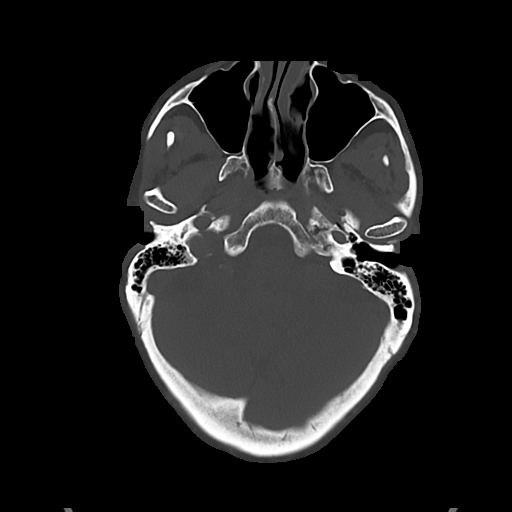
[im 26/85  bone]
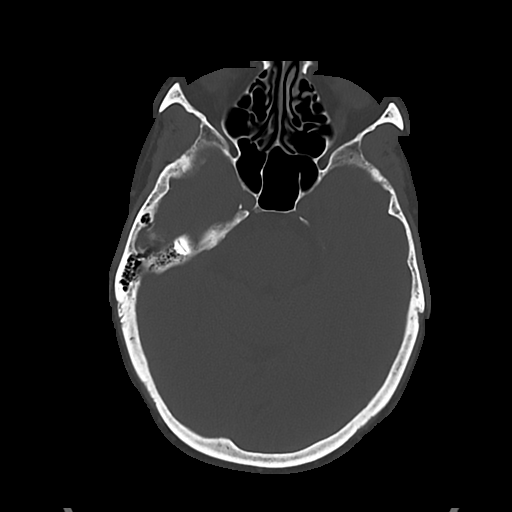
[im 38/85  bone]
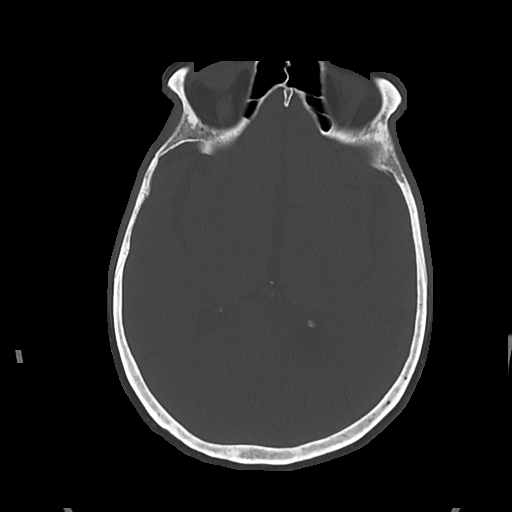

[Series 5: cor soft · coronal · 0.33mm/px · 3 of 69 slices shown]
[im 23/69  brain]
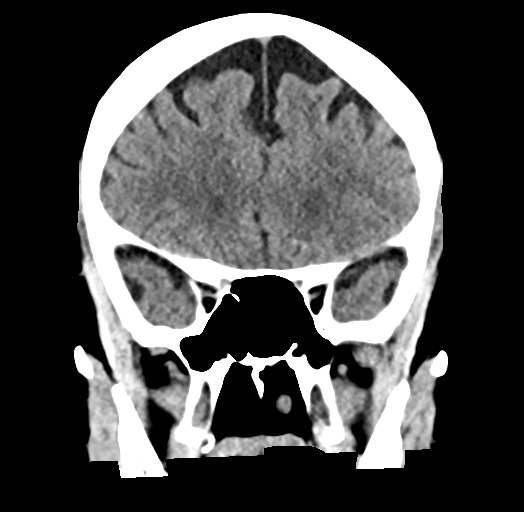
[im 31/69  brain]
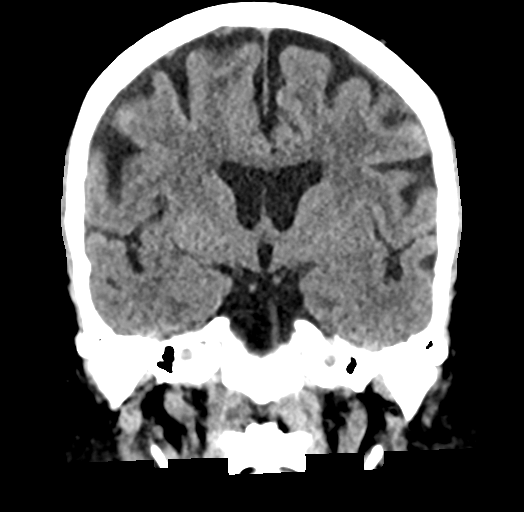
[im 38/69  brain]
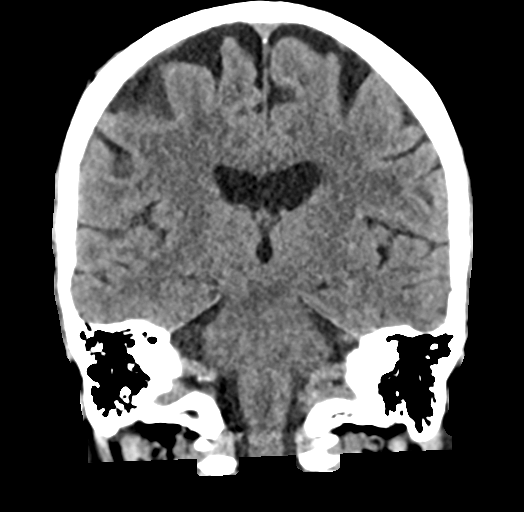

[Series 6: sag soft · sagittal · 0.37mm/px · 3 of 58 slices shown]
[im 20/58  brain]
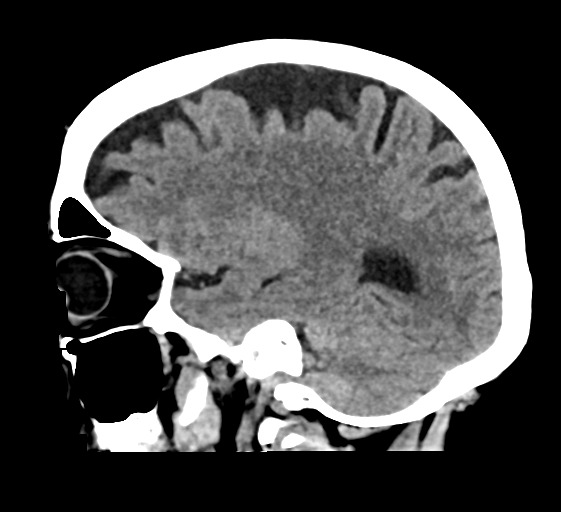
[im 29/58  brain]
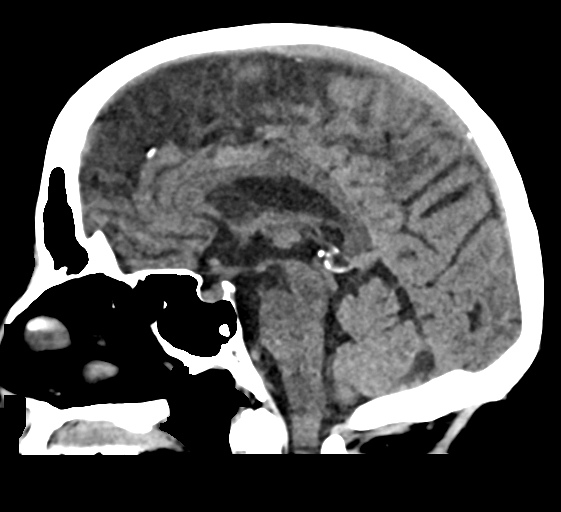
[im 39/58  brain]
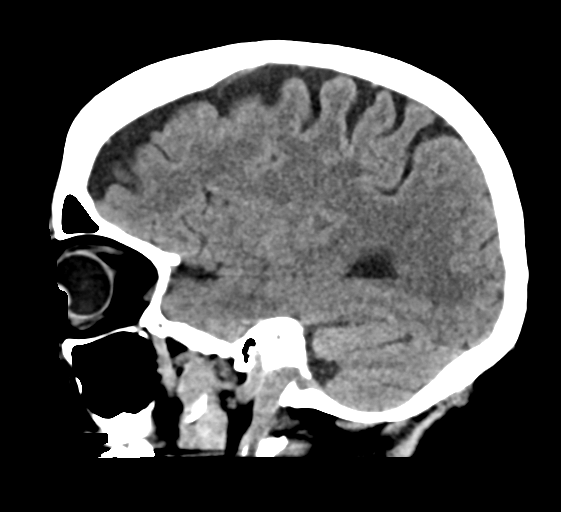

[17 of 47 positions shown; findings below may reference images not displayed]

FINDINGS: Brain: Generalized atrophy is normal for age. No intracranial
hemorrhage, mass effect, or midline shift. No hydrocephalus. The
basilar cisterns are patent. Minor periventricular and deep chronic
small vessel ischemia. No evidence of territorial infarct or acute
ischemia. No extra-axial or intracranial fluid collection.

Vascular: No hyperdense vessel or unexpected calcification.

Skull: No fracture or focal lesion.

Sinuses/Orbits: Paranasal sinuses and mastoid air cells are clear.
The visualized orbits are unremarkable. Tiny sphenoid osteoma,
incidental

Other: Small right parietal scalp laceration and hematoma.
IMPRESSION: Small right parietal scalp laceration and hematoma. No acute
intracranial abnormality. No skull fracture.

## 2022-03-11 NOTE — ED Notes (Signed)
Patient Alert and oriented to baseline. Stable and ambulatory to baseline. Patient verbalized understanding of the discharge instructions.  Patient belongings were taken by the patient.   

## 2022-03-11 NOTE — ED Notes (Signed)
Trauma Response Nurse Documentation ? ? ?Yvonne Jordan is a 76 y.o. female arriving to [facility] ED via EMS ? ?On clopidogrel 75 mg daily. Trauma was activated as a Level 2 based on the following trauma criteria Elderly patients > 65 with head trauma on anti-coagulation (excluding ASA). Trauma team at the bedside on patient arrival. Patient cleared for CT by Dr. Zenia Resides. Patient to CT with team. GCS 15. ? ?History  ? Past Medical History:  ?Diagnosis Date  ? Allergic rhinitis   ? Arthritis   ? Cancer [facility])   ? Breast- left  ? CKD (chronic kidney disease)   ? stage III  ? Colon polyp   ? Dyspnea   ? GERD (gastroesophageal reflux disease)   ? History of hiatal hernia   ? Hyperlipidemia   ? Personal history of radiation therapy   ? Stroke [facility])   ? TIA before 2013  ? TIA (transient ischemic attack)   ? Vitamin D deficiency   ?  ? Past Surgical History:  ?Procedure Laterality Date  ? APPENDECTOMY    ? BREAST LUMPECTOMY Left 2006  ? BREAST SURGERY    ? 2006...treated with Tamoxifen for 6 years  ? CHOLECYSTECTOMY    ? COLONOSCOPY    ? ESOPHAGOGASTRODUODENOSCOPY N/A 09/01/2020  ? Procedure: ESOPHAGOGASTRODUODENOSCOPY (EGD);  Surgeon: Lajuana Matte, MD;  Location: [place];  Service: Thoracic;  Laterality: N/A;  ? XI ROBOTIC ASSISTED PARAESOPHAGEAL HERNIA REPAIR N/A 09/01/2020  ? Procedure: XI ROBOTIC ASSISTED HIATAL HERNIA REPAIR WITH FUNDOPLICATION AND GASTROPEXY;  Surgeon: Lajuana Matte, MD;  Location: [place];  Service: Thoracic;  Laterality: N/A;  ?  ? ? ? ?Initial Focused Assessment (If applicable, or please see trauma documentation): ?Patient was at the grocery store, handed her husband a bag and lost her balance hitting the right side of her head on the concrete. Approx 1cm laceration, bleeding controlled. No other signs of trauma. Patient is A&Ox4, GCS 15. Pupils 3 ERR. ? ?CT's Completed:   ?CT Head and CT C-Spine  ? ?Interventions:  ?IV, Labs ?CT Head/C-Spine ?Stapled to right head lac ? ?Plan for  disposition:  ?Discharge home if imaging is negative ? ?Bedside handoff with ED RN Crystal.   ? ?Park Pope Raney Antwine  ?Trauma Response RN ? ?Please call TRN at [phone] for further assistance. ?  ?

## 2022-03-11 NOTE — Progress Notes (Signed)
Chaplain responded to Fall on Thinners; pt not available, no family is present.  Checked in with RN.  Please advise if spiritual care support is needed. ? ?Minus Liberty, Chaplain ?Pager:  [phone] ? ? ? 03/11/22 1600  ?Clinical Encounter Type  ?Visited With Patient not available  ?Visit Type Initial;Trauma  ?Referral From Nurse  ?Consult/Referral To Chaplain  ?Stress Factors  ?Patient Stress Factors Health changes  ? ? ?

## 2022-03-11 NOTE — ED Triage Notes (Signed)
Patient tripped and fell in a parking lot, hitting the right side of her head on the concrete. No LOC, takes plavix. Patient is alert, oriented, and holding pressure to right side of head to slow bleeding.  ?

## 2022-03-11 NOTE — ED Provider Notes (Signed)
?[facility] ?Provider Note ? ? ?CSN: [id number] ?Arrival date & time: 03/11/22  1618 ? ?  ? ?History ? ?Chief Complaint  ?Patient presents with  ? Fall  ? ? ?Yvonne Jordan is a 76 y.o. female. ? ?76 year old female here after mechanical fall.  Patient states that she tripped and fell and struck the right side of her head.  No loss of conscious.  Does take Plavix.  Denies any nausea or vomiting.  No mental status changes.  Denies any neck discomfort.  Presents for further evaluation ? ? ?  ? ?Home Medications ?Prior to Admission medications   ?Medication Sig Start Date End Date Taking? Authorizing Provider  ?acetaminophen (TYLENOL) 325 MG tablet Take 650 mg by mouth every 6 (six) hours as needed for moderate pain.     [provider]  ?atorvastatin (LIPITOR) 20 MG tablet Take 20 mg by mouth daily.  01/02/19   [provider]  ?clopidogrel (PLAVIX) 75 MG tablet Take 75 mg by mouth daily with breakfast.    [provider]  ?diclofenac Sodium (VOLTAREN) 1 % GEL Voltaren 1 % topical gel ? APPLY 2 GRAMS TO THE AFFECTED AREA(S) BY TOPICAL ROUTE 4 TIMES PER DAY    [provider]  ?lipase/protease/amylase (CREON) 12000-38000 units CPEP capsule Take 36,000 Units by mouth. 2 with each meal and 1 with each snack    [provider]  ?ondansetron (ZOFRAN-ODT) 4 MG disintegrating tablet Take 1 tablet (4 mg total) by mouth every 8 (eight) hours as needed for nausea or vomiting. ?Patient not taking: Reported on 06/23/2021 09/03/20   Antony Odea, PA-C  ?pantoprazole (PROTONIX) 40 MG tablet Take 1 tablet (40 mg total) by mouth 2 (two) times daily before a meal. For 30 days ?Patient not taking: Reported on 06/23/2021 11/10/14   Tanda Rockers, MD  ?Polyethyl Glycol-Propyl Glycol (SYSTANE OP) Place 1 drop into both eyes 2 (two) times daily as needed (dry eyes).     [provider]  ?tretinoin (RETIN-A) 0.025 % cream Apply 1 application  topically at bedtime.  07/06/20   [provider]  ?   ? ?Allergies    ?Patient has no known allergies.   ? ?Review of Systems   ?Review of Systems  ?All other systems reviewed and are negative. ? ?Physical Exam ?Updated Vital Signs ?BP 116/68   Pulse 84   Temp (!) 97.2 ?F (36.2 ?C) (Temporal)   Resp 16   Ht 1.651 m ('5\' 5"'$ )   Wt 68 kg   SpO2 96%   BMI 24.96 kg/m?  ?Physical Exam ?Vitals and nursing note reviewed.  ?Constitutional:   ?   General: She is not in acute distress. ?   Appearance: Normal appearance. She is well-developed. She is not toxic-appearing.  ?HENT:  ?   Head:  ? ?Eyes:  ?   General: Lids are normal.  ?   Conjunctiva/sclera: Conjunctivae normal.  ?   Pupils: Pupils are equal, round, and reactive to light.  ?Neck:  ?   Thyroid: No thyroid mass.  ?   Trachea: No tracheal deviation.  ?Cardiovascular:  ?   Rate and Rhythm: Normal rate and regular rhythm.  ?   Heart sounds: Normal heart sounds. No murmur heard. ?  No gallop.  ?Pulmonary:  ?   Effort: Pulmonary effort is normal. No respiratory distress.  ?   Breath sounds: Normal breath sounds. No stridor. No decreased breath sounds, wheezing, rhonchi or  rales.  ?Abdominal:  ?   General: There is no distension.  ?   Palpations: Abdomen is soft.  ?   Tenderness: There is no abdominal tenderness. There is no rebound.  ?Musculoskeletal:     ?   General: No tenderness. Normal range of motion.  ?   Cervical back: Normal range of motion and neck supple.  ?Skin: ?   General: Skin is warm and dry.  ?   Findings: No abrasion or rash.  ?Neurological:  ?   General: No focal deficit present.  ?   Mental Status: She is alert and oriented to person, place, and time. Mental status is at baseline.  ?   GCS: GCS eye subscore is 4. GCS verbal subscore is 5. GCS motor subscore is 6.  ?   Cranial Nerves: No cranial nerve deficit.  ?   Sensory: No sensory deficit.  ?   Motor: Motor function is intact.  ?Psychiatric:     ?   Attention and Perception: Attention  normal.     ?   Speech: Speech normal.     ?   Behavior: Behavior normal.  ? ? ?ED Results / Procedures / Treatments   ?Labs ?(all labs ordered are listed, but only abnormal results are displayed) ?Labs Reviewed - No data to display ? ?EKG ?None ? ?Radiology ?No results found. ? ?Procedures ?Procedures  ? ? ?Medications Ordered in ED ?Medications - No data to display ? ?ED Course/ Medical Decision Making/ A&P ?  ?                        ?Medical Decision Making ? ?LACERATION REPAIR ?Performed by: Leota Jacobsen ?Authorized by: Leota Jacobsen ?Consent: Verbal consent obtained. ?Risks and benefits: risks, benefits and alternatives were discussed ?Consent given by: patient ?Patient identity confirmed: provided demographic data ?Prepped and Draped in normal sterile fashion ?Wound explored ? ?Laceration Location: Scalp ? ?Laceration Length: cm ?1.5 ?No Foreign Bodies seen or palpated ? ?Anesthesia: local infiltration ? ? ?Irrigation method: syringe ?Amount of cleaning: standard ? ?Skin closure: Staples ? ?Number of staples: 3 ? ?Technique: Simple ? ?Patient tolerance: Patient tolerated the procedure well with no immediate complications.  ? ? ?Patient presented after mechanical fall just prior to arrival.  Patient is on Plavix and concern for intracranial hemorrhage.  Patient had a head CT which per my interpretation did not show any signs of intracranial hemorrhage.  She has no focal neurological features at this time.  I also had a CT scan of her cervical spine which per my interpretation did not show any acute findings.  Laceration repaired as above.  No indication for labs at this time.  We discharged home with friend ? ? ? ? ? ? ?Final Clinical Impression(s) / ED Diagnoses ?Final diagnoses:  ?None  ? ? ?Rx / DC Orders ?ED Discharge Orders   ? ? None  ? ?  ? ? ?  ?Lacretia Leigh, MD ?03/11/22 1752 ? ?

## 2022-03-11 NOTE — Discharge Instructions (Signed)
Staples out in 7 to 10 days 

## 2022-03-11 NOTE — Progress Notes (Signed)
Orthopedic Tech Progress Note ?Patient Details:  ?Yvonne Jordan [birth year] ?[id number] ? ?Level 2 trauma ? ?Patient ID: Yvonne Jordan, female   DOB: 1946-12-04, 76 y.o.   MRN: [id number] ? ?[name] ?03/11/2022, 4:32 PM ? ?

## 2022-03-12 DIAGNOSIS — H1012 Acute atopic conjunctivitis, left eye: Secondary | ICD-10-CM | POA: Diagnosis not present

## 2022-03-12 DIAGNOSIS — R220 Localized swelling, mass and lump, head: Secondary | ICD-10-CM | POA: Diagnosis not present

## 2022-03-19 ENCOUNTER — Other Ambulatory Visit: Payer: Self-pay | Admitting: Family Medicine

## 2022-03-19 ENCOUNTER — Ambulatory Visit
Admission: RE | Admit: 2022-03-19 | Discharge: 2022-03-19 | Disposition: A | Discharge status: Home / Self Care | Payer: Medicare Other | Source: Ambulatory Visit | Attending: Family Medicine | Admitting: Family Medicine

## 2022-03-19 ENCOUNTER — Other Ambulatory Visit: Payer: Self-pay

## 2022-03-19 DIAGNOSIS — R410 Disorientation, unspecified: Secondary | ICD-10-CM | POA: Diagnosis not present

## 2022-03-19 DIAGNOSIS — S0990XA Unspecified injury of head, initial encounter: Secondary | ICD-10-CM | POA: Diagnosis not present

## 2022-03-19 DIAGNOSIS — M21372 Foot drop, left foot: Secondary | ICD-10-CM | POA: Diagnosis not present

## 2022-03-19 DIAGNOSIS — S0101XD Laceration without foreign body of scalp, subsequent encounter: Secondary | ICD-10-CM | POA: Diagnosis not present

## 2022-03-19 IMAGING — CT CT HEAD W/O CM
4 series · 16 of 47 positions shown, 18 images · non-contrast
Comparison: [DATE].

CLINICAL DATA: A 75-year-old female presents post fall, reportedly
on Plavix. Also with history of breast cancer.



[Series 2: head 5.00 hr40 s3 axial ibhc · axial · 0.46mm/px · z∈[-690,-570]mm · 7 of 33 slices shown, 9 images]
[im 5/33  brain]
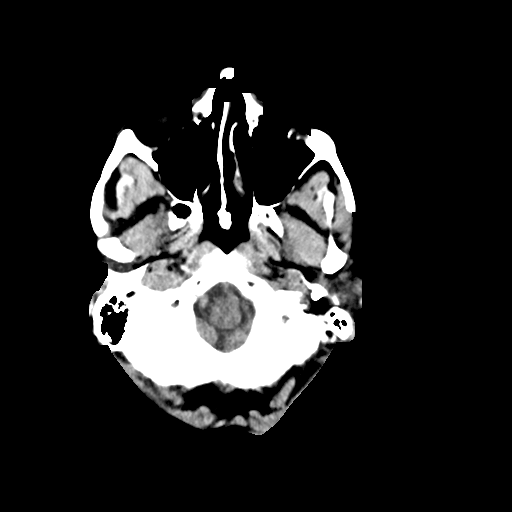
[im 5/33  bone]
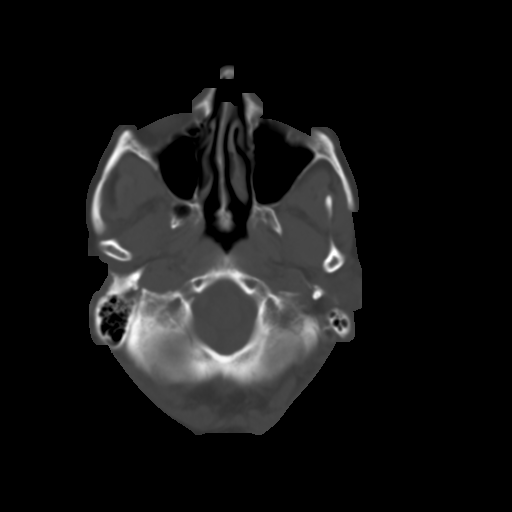
[im 9/33  brain]
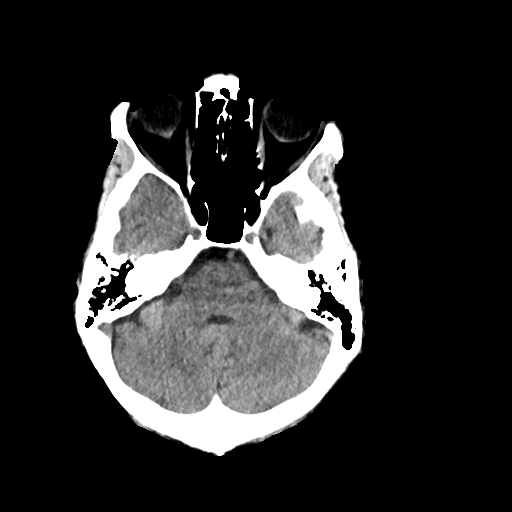
[im 13/33  brain]
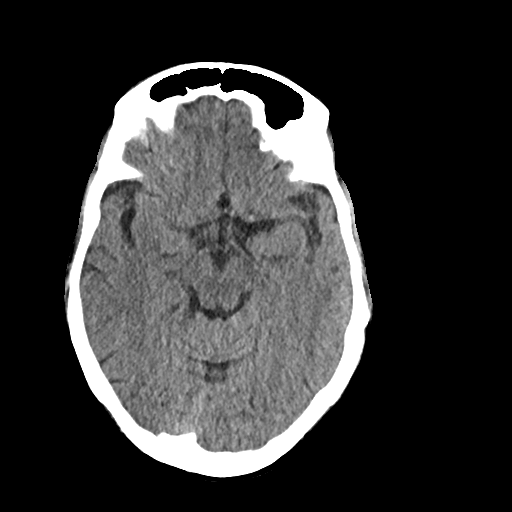
[im 17/33  brain]
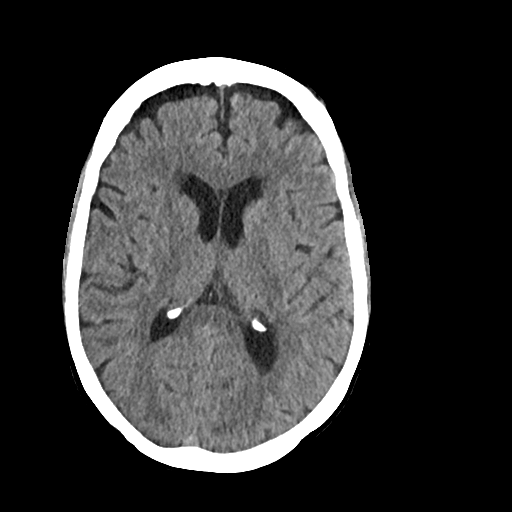
[im 21/33  brain]
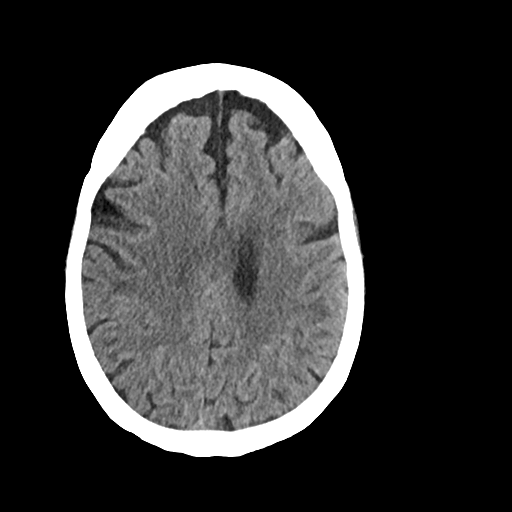
[im 21/33  bone]
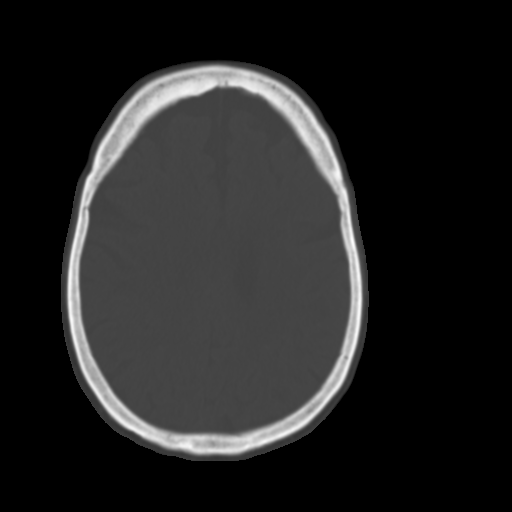
[im 25/33  brain]
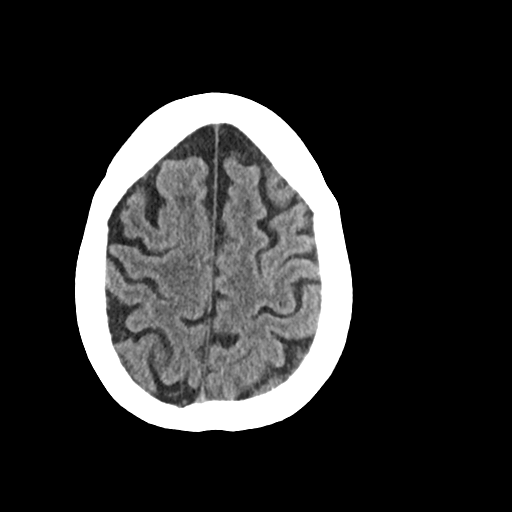
[im 29/33  brain]
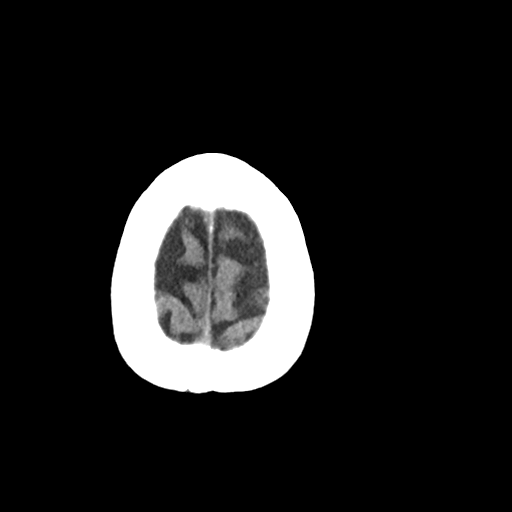

[Series 3: head 2.00 hr60 s3 axial bone · axial · 0.46mm/px · z∈[-695,-663]mm · 3 of 83 slices shown]
[im 9/83  bone]
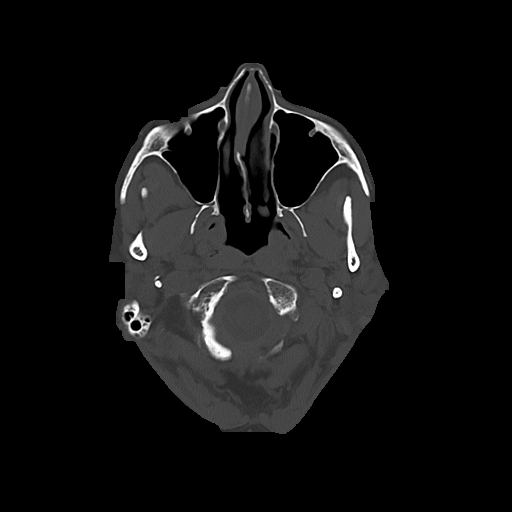
[im 17/83  bone]
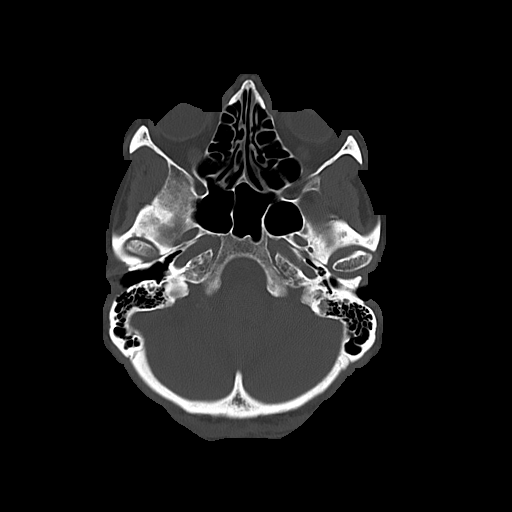
[im 25/83  bone]
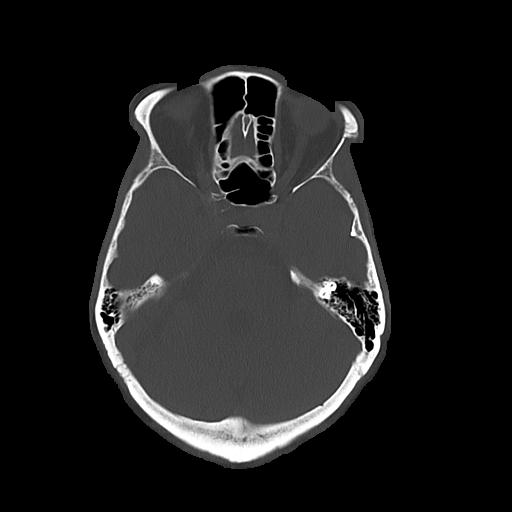

[Series 4: head 3.00 hr40 s3 sag · sagittal · 0.33mm/px · 3 of 58 slices shown]
[im 20/58  brain]
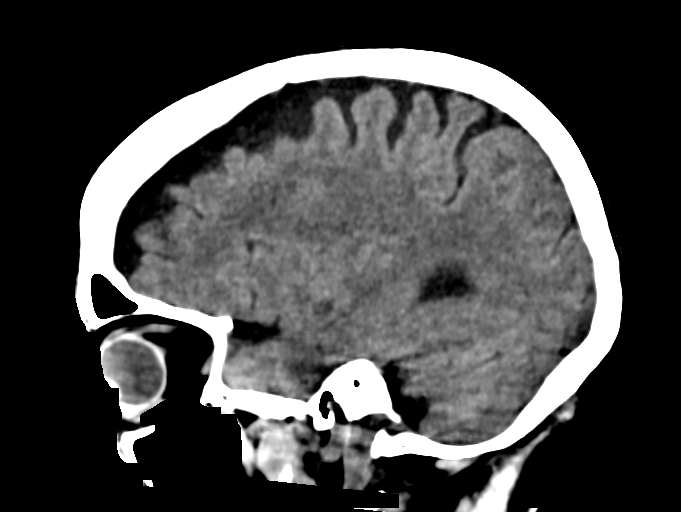
[im 29/58  brain]
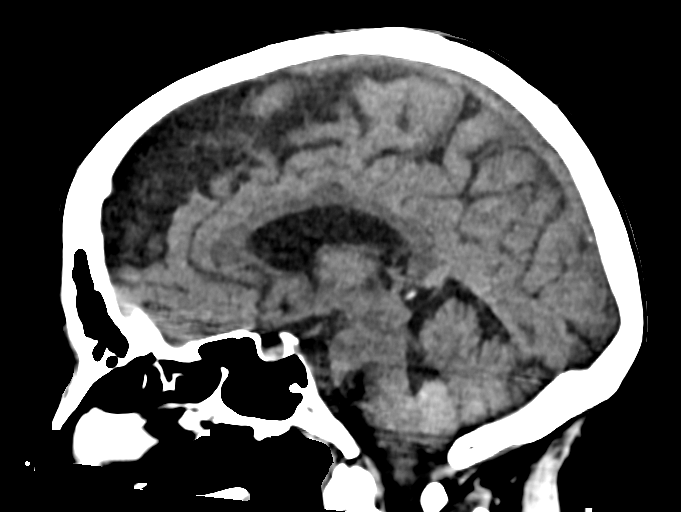
[im 39/58  brain]
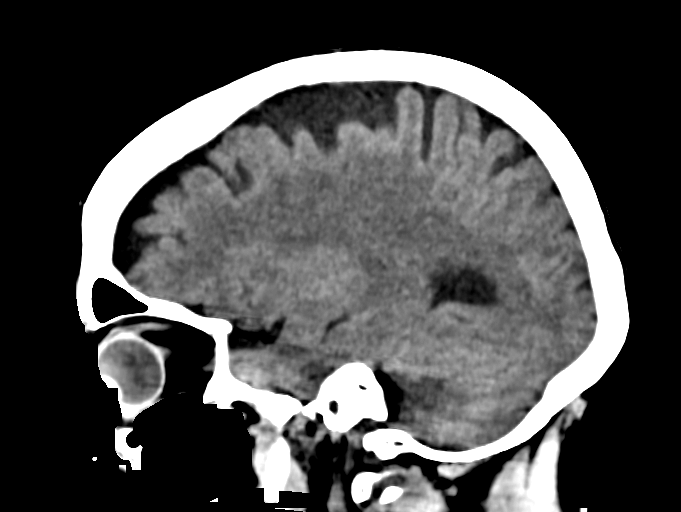

[Series 6: head 3.00 hr40 s3 cor · coronal · 0.33mm/px · 3 of 74 slices shown]
[im 25/74  brain]
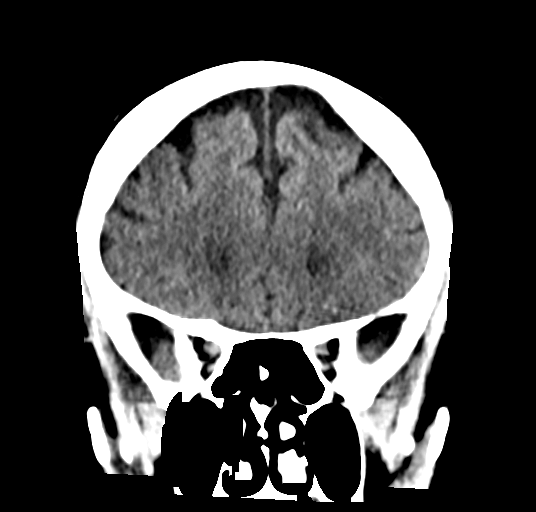
[im 33/74  brain]
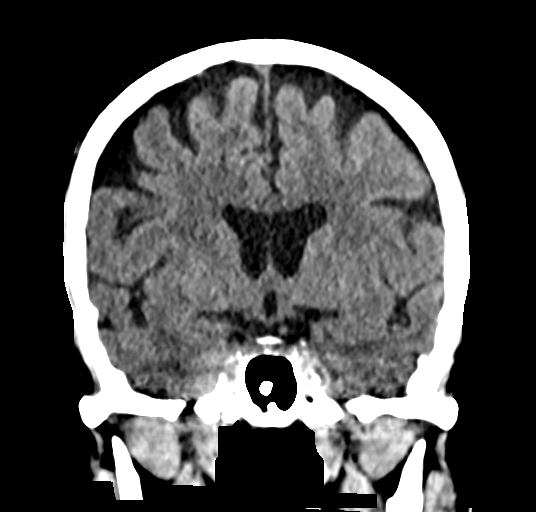
[im 41/74  brain]
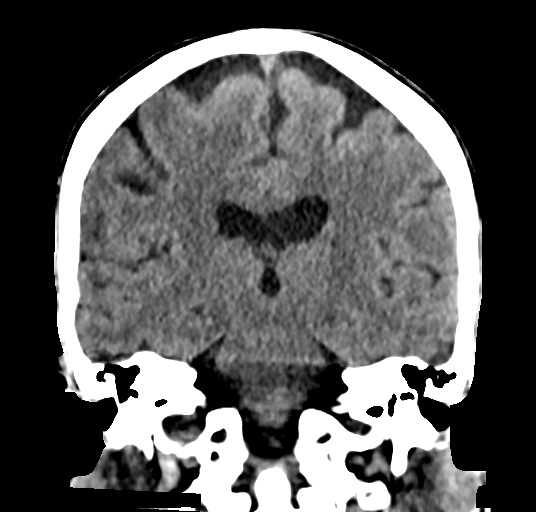

[16 of 47 positions shown; findings below may reference images not displayed]

FINDINGS: Brain: No evidence of acute infarction, hemorrhage, hydrocephalus,
extra-axial collection or mass lesion/mass effect. Signs of atrophy
and chronic microvascular ischemic change as before.

Vascular: No hyperdense vessel or unexpected calcification.

Skull: Normal. Negative for fracture or focal lesion.

Sinuses/Orbits: Visualized paranasal sinuses and orbits are
unremarkable. Maxillary sinuses are incompletely imaged.

Other: None
IMPRESSION: 1. No acute intracranial abnormality.
2. Signs of atrophy and chronic microvascular ischemic change
similar to previous imaging.

## 2022-03-27 DIAGNOSIS — H2511 Age-related nuclear cataract, right eye: Secondary | ICD-10-CM | POA: Diagnosis not present

## 2022-03-27 DIAGNOSIS — H25811 Combined forms of age-related cataract, right eye: Secondary | ICD-10-CM | POA: Diagnosis not present

## 2022-04-13 ENCOUNTER — Encounter (HOSPITAL_BASED_OUTPATIENT_CLINIC_OR_DEPARTMENT_OTHER): Payer: Self-pay | Admitting: Cardiology

## 2022-04-13 ENCOUNTER — Ambulatory Visit (INDEPENDENT_AMBULATORY_CARE_PROVIDER_SITE_OTHER): Payer: Medicare Other | Admitting: Cardiology

## 2022-04-13 VITALS — BP 100/52 | HR 84 | Ht 65.0 in | Wt 146.1 lb

## 2022-04-13 DIAGNOSIS — R002 Palpitations: Secondary | ICD-10-CM | POA: Diagnosis not present

## 2022-04-13 DIAGNOSIS — Z7189 Other specified counseling: Secondary | ICD-10-CM | POA: Diagnosis not present

## 2022-04-13 NOTE — Progress Notes (Signed)
?Cardiology Office Note:   ? ?Date:  04/20/2022  ? ?ID:  Yvonne Jordan, DOB August 27, 1946, MRN [id number] ? ?PCP:  Antony Contras, MD  ?Cardiologist:  Buford Dresser, MD ? ?Referring MD: Antony Contras, MD  ? ?CC: follow up ? ?History of Present Illness:   ? ?Yvonne Jordan is a 76 y.o. female with a hx of TIA on statin and clopidogrel, hyperlipidemia who is for follow up. I initially saw her 02/2019 as a new consult at the request of Antony Contras, MD for the evaluation and management of chest pain and dyspnea. ? ?Family history: sister died of MI in her 37s, brother had MI, mother had MI, many members with strokes (mom, dad both).  ? ?Today: ?Had a mechanical fall 03/11/22. Had another fall yesterday. Has foot drop, brace causes her to have a lot of foot pain.  ? ?On Creon for pancreatic insufficiency, has struggled with diarrhea. Feels like speech is slowing down. ? ?Has palpitations after eating, if she lays down flat. Happens only briefly, goes away on its own. Not limiting. ? ?Denies chest pain, shortness of breath at rest or with normal exertion. No PND, orthopnea, LE edema or unexpected weight gain. No syncope.  ? ?Past Medical History:  ?Diagnosis Date  ? Allergic rhinitis   ? Arthritis   ? Cancer [facility])   ? Breast- left  ? CKD (chronic kidney disease)   ? stage III  ? Colon polyp   ? Dyspnea   ? GERD (gastroesophageal reflux disease)   ? History of hiatal hernia   ? Hyperlipidemia   ? Personal history of radiation therapy   ? Stroke [facility])   ? TIA before 2013  ? TIA (transient ischemic attack)   ? Vitamin D deficiency   ? ? ?Past Surgical History:  ?Procedure Laterality Date  ? APPENDECTOMY    ? BREAST LUMPECTOMY Left 2006  ? BREAST SURGERY    ? 2006...treated with Tamoxifen for 6 years  ? CHOLECYSTECTOMY    ? COLONOSCOPY    ? ESOPHAGOGASTRODUODENOSCOPY N/A 09/01/2020  ? Procedure: ESOPHAGOGASTRODUODENOSCOPY (EGD);  Surgeon: Lajuana Matte, MD;  Location: [place];  Service: Thoracic;  Laterality:  N/A;  ? XI ROBOTIC ASSISTED PARAESOPHAGEAL HERNIA REPAIR N/A 09/01/2020  ? Procedure: XI ROBOTIC ASSISTED HIATAL HERNIA REPAIR WITH FUNDOPLICATION AND GASTROPEXY;  Surgeon: Lajuana Matte, MD;  Location: [place];  Service: Thoracic;  Laterality: N/A;  ? ? ?Current Medications: ?Current Outpatient Medications on File Prior to Visit  ?Medication Sig  ? acetaminophen (TYLENOL) 325 MG tablet Take 650 mg by mouth every 6 (six) hours as needed for moderate pain.   ? atorvastatin (LIPITOR) 20 MG tablet Take 20 mg by mouth daily.   ? azelastine (OPTIVAR) 0.05 % ophthalmic solution Place 1 drop into the left eye 2 (two) times daily.  ? brimonidine (ALPHAGAN) 0.2 % ophthalmic solution Place 1 drop into the right eye 3 (three) times daily.  ? clopidogrel (PLAVIX) 75 MG tablet Take 75 mg by mouth daily with breakfast.  ? lipase/protease/amylase (CREON) 12000-38000 units CPEP capsule Take 36,000 Units by mouth. 2 with each meal and 1 with each snack  ? Polyethyl Glycol-Propyl Glycol (SYSTANE OP) Place 1 drop into both eyes 2 (two) times daily as needed (dry eyes).   ? RESTASIS 0.05 % ophthalmic emulsion Place 1 drop into both eyes 2 (two) times daily.  ? ?No current facility-administered medications on file prior to visit.  ?  ? ?Allergies:   Patient  has no known allergies.  ? ?Social History  ? ?Tobacco Use  ? Smoking status: Never  ? Smokeless tobacco: Never  ?Vaping Use  ? Vaping Use: Never used  ?Substance Use Topics  ? Alcohol use: Yes  ?  Alcohol/week: 0.0 standard drinks  ?  Comment: 1 drink -every few weeks  ? Drug use: No  ? ? ?Family History: ?The patient's family history includes Breast cancer in her sister; Deep vein thrombosis in her mother; Heart attack in her sister; Heart disease in her father, maternal uncle, maternal uncle, and maternal uncle; Stroke in her father and mother. ? ?ROS:   ?Please see the history of present illness.  Additional pertinent ROS otherwise unremarkable. ? ?EKGs/Labs/Other Studies  Reviewed:   ? ?The following studies were reviewed today: ?Prior notes and stress test results ? ?EKG:  EKG is personally reviewed.   ?03/11/22: SR, artifact (I do not appreciate PVCs) ?03/10/21: normal sinus rhythm at 63 bpm ? ?Recent Labs: ?No results found for requested labs within last 8760 hours.  ?Recent Lipid Panel ?No results found for: CHOL, TRIG, HDL, CHOLHDL, VLDL, LDLCALC, LDLDIRECT ? ?Physical Exam:   ? ?VS:  BP (!) 100/52   Pulse 84   Ht '5\' 5"'$  (1.651 m)   Wt 146 lb 1.6 oz (66.3 kg)   SpO2 98%   BMI 24.31 kg/m?    ? ?Wt Readings from Last 3 Encounters:  ?04/13/22 146 lb 1.6 oz (66.3 kg)  ?03/11/22 150 lb (68 kg)  ?06/23/21 158 lb (71.7 kg)  ?  ?GEN: Well nourished, well developed in no acute distress ?HEENT: Normal, moist mucous membranes ?NECK: No JVD ?CARDIAC: regular rhythm, normal S1 and S2, no rubs or gallops. No murmur. ?VASCULAR: Radial and DP pulses 2+ bilaterally. No carotid bruits ?RESPIRATORY:  Clear to auscultation without rales, wheezing or rhonchi  ?ABDOMEN: Soft, non-tender, non-distended ?MUSCULOSKELETAL:  Ambulates independently ?SKIN: Warm and dry, no edema ?NEUROLOGIC:  Alert and oriented x 3. No focal neuro deficits noted. ?PSYCHIATRIC:  Normal affect   ? ?ASSESSMENT:   ? ?No diagnosis found. ? ?PLAN:   ? ?Chest pain: resolved ?-coronary CTA without CAD, calcium score 0 ? ?Palpitations: with lying in bed ?-reviewed [facility] today ?- if becomes more frequent/bothersome she will contact me and we can do monitor at that time ?-ECG normal 03/11/22 (artifact) ? ?History of TIA: on clopidogrel and statin. Continue ? ?Cardiac risk counseling and prevention recommendations: ?-recommend heart healthy/Mediterranean diet, with whole grains, fruits, vegetable, fish, lean meats, nuts, and olive oil. Limit salt.  ?-recommend moderate walking, 3-5 times/week for 30-50 minutes each session. Aim for at least 150 minutes.week. Goal should be pace of 3 miles/hours, or walking 1.5 miles in 30  minutes ?-recommend avoidance of tobacco products. Avoid excess alcohol. ? ?Plan for follow up: 1 year or sooner as needed ? ?Medication Adjustments/Labs and Tests Ordered: ?Current medicines are reviewed at length with the patient today.  Concerns regarding medicines are outlined above.  ?No orders of the defined types were placed in this encounter. ? ?No orders of the defined types were placed in this encounter. ? ? ?Patient Instructions  ?Medication Instructions:  ?Your Physician recommend you continue on your current medication as directed.   ? ?*If you need a refill on your cardiac medications before your next appointment, please call your pharmacy* ? ? ?Lab Work: ?None ordered today ? ? ?Testing/Procedures: ?None ordered today ? ? ?Follow-Up: ?At [facility], you and your health needs are  our priority.  As part of our continuing mission to provide you with exceptional heart care, we have created designated Provider Care Teams.  These Care Teams include your primary Cardiologist (physician) and Advanced Practice Providers (APPs -  Physician Assistants and Nurse Practitioners) who all work together to provide you with the care you need, when you need it. ? ?We recommend signing up for the patient portal called "MyChart".  Sign up information is provided on this After Visit Summary.  MyChart is used to connect with patients for Virtual Visits (Telemedicine).  Patients are able to view lab/test results, encounter notes, upcoming appointments, etc.  Non-urgent messages can be sent to your provider as well.   ?To learn more about what you can do with MyChart, go to [email or web address].   ? ?Your next appointment:   ?1 year(s) ? ?The format for your next appointment:   ?In Person ? ?Provider:   ?Buford Dresser, MD{ ? ? ?Important Information About Sugar ? ? ? ? ? ?  ? ?Signed, ?Buford Dresser, MD PhD ?04/20/2022 2:14 PM    ?[place] ?

## 2022-04-13 NOTE — Patient Instructions (Signed)
Medication Instructions:  ?Your Physician recommend you continue on your current medication as directed.   ? ?*If you need a refill on your cardiac medications before your next appointment, please call your pharmacy* ? ? ?Lab Work: ?None ordered today ? ? ?Testing/Procedures: ?None ordered today ? ? ?Follow-Up: ?At [facility], you and your health needs are our priority.  As part of our continuing mission to provide you with exceptional heart care, we have created designated Provider Care Teams.  These Care Teams include your primary Cardiologist (physician) and Advanced Practice Providers (APPs -  Physician Assistants and Nurse Practitioners) who all work together to provide you with the care you need, when you need it. ? ?We recommend signing up for the patient portal called "MyChart".  Sign up information is provided on this After Visit Summary.  MyChart is used to connect with patients for Virtual Visits (Telemedicine).  Patients are able to view lab/test results, encounter notes, upcoming appointments, etc.  Non-urgent messages can be sent to your provider as well.   ?To learn more about what you can do with MyChart, go to [email or web address].   ? ?Your next appointment:   ?1 year(s) ? ?The format for your next appointment:   ?In Person ? ?Provider:   ?Buford Dresser, MD{ ? ? ?Important Information About Sugar ? ? ? ? ? ? ?

## 2022-04-18 DIAGNOSIS — H2512 Age-related nuclear cataract, left eye: Secondary | ICD-10-CM | POA: Diagnosis not present

## 2022-04-18 DIAGNOSIS — H25012 Cortical age-related cataract, left eye: Secondary | ICD-10-CM | POA: Diagnosis not present

## 2022-04-20 ENCOUNTER — Encounter (HOSPITAL_BASED_OUTPATIENT_CLINIC_OR_DEPARTMENT_OTHER): Payer: Self-pay | Admitting: Cardiology

## 2022-04-24 DIAGNOSIS — H25812 Combined forms of age-related cataract, left eye: Secondary | ICD-10-CM | POA: Diagnosis not present

## 2022-04-24 DIAGNOSIS — H25012 Cortical age-related cataract, left eye: Secondary | ICD-10-CM | POA: Diagnosis not present

## 2022-04-24 DIAGNOSIS — H2512 Age-related nuclear cataract, left eye: Secondary | ICD-10-CM | POA: Diagnosis not present

## 2022-05-08 ENCOUNTER — Telehealth: Payer: Self-pay | Admitting: Neurology

## 2022-05-08 ENCOUNTER — Ambulatory Visit (INDEPENDENT_AMBULATORY_CARE_PROVIDER_SITE_OTHER): Payer: Medicare Other | Admitting: Neurology

## 2022-05-08 ENCOUNTER — Encounter: Payer: Self-pay | Admitting: Neurology

## 2022-05-08 VITALS — BP 114/65 | HR 77 | Ht 65.0 in | Wt 141.0 lb

## 2022-05-08 DIAGNOSIS — R471 Dysarthria and anarthria: Secondary | ICD-10-CM | POA: Diagnosis not present

## 2022-05-08 DIAGNOSIS — K903 Pancreatic steatorrhea: Secondary | ICD-10-CM

## 2022-05-08 DIAGNOSIS — R269 Unspecified abnormalities of gait and mobility: Secondary | ICD-10-CM | POA: Insufficient documentation

## 2022-05-08 DIAGNOSIS — R531 Weakness: Secondary | ICD-10-CM | POA: Diagnosis not present

## 2022-05-08 DIAGNOSIS — M21372 Foot drop, left foot: Secondary | ICD-10-CM | POA: Diagnosis not present

## 2022-05-08 NOTE — Progress Notes (Signed)
? ?Chief Complaint  ?Patient presents with  ? New Patient (Initial Visit)  ?  Rm 14. Alone. ?NX Aiman Noe 2017/Paper proficient/David Swayne MD [facility]/Memory loss, hx of TIA and TBI.  ? ? ? ? ?ASSESSMENT AND PLAN ? ?Yvonne Jordan is a 76 y.o. female   ?Gradual onset of painless muscle atrophy, weakness, ? Noticeable weakness involving bulbar muscles, bilateral cervical, and lumbar sacral myotomes, previously was evaluated for shortness of breath, suggest involvement of thoracic myotomes. ? Hyperreflexia on examination, bilateral Babinski signs, no sensory loss, ? 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 Extensive laboratory evaluations, ? MRI of neuraxis ? EMG nerve conduction study ? ? ?DIAGNOSTIC DATA (LABS, IMAGING, TESTING) ?- I reviewed patient records, labs, notes, testing and imaging myself where available. ? ?Laboratory evaluation in March 2023, normal CMP, creatinine of 0.96, CBC hemoglobin of 12 ? ?MEDICAL HISTORY: ? ?Yvonne Jordan is a 76 year old female, seen in request by her primary care physician Dr. Antony Contras, for evaluation of left foot drop, dysarthria, mild memory loss, initial evaluation was on May 08, 2022 ? ?I reviewed and summarized the referring note. PMHx. ?HLD ?Left breast Cancer, s/p left lobectomy, radiation. ?Chronic kidney disease,  ?GERD ?TIA- 20 years ago, presented with right facial numbness, dysarthria, ?Hiatal Hernia surgery,  ? ? ?She was seen by cardiothoracic surgeon for evaluation of chronic aspiration, hiatal hernia, aspiration pneumonia, had robotic assisted laparoscopic surgery in September 2021, following that surgery, she reported gradual onset of language difficulty, slurred speech, gradually getting worse over the past couple years, she continue has mild swallowing difficulty, choking on food  occasionally, ? ?At the beginning of the interview, she denied leg muscle weakness, on further questioning, she actually developed profound left foot drop, gait abnormality, has fell multiple times, wearing left AFO, ? ?She denies significant pain, no sensory change, no incontinence, no double vision, she is the main caregiver of her husband, who suffered significant gait abnormality per patient due to lower extremity arthritis, and also memory loss, she has 2 adult children live in [place] and [place], ? ? ?PHYSICAL EXAM: ?  ?Vitals:  ? 05/08/22 1012  ?BP: 114/65  ?Pulse: 77  ?Weight: 141 lb (64 kg)  ?Height: '5\' 5"'$  (1.651 m)  ? ?Not recorded ?  ? ? ?Body mass index is 23.46 kg/m?. ? ?PHYSICAL EXAMNIATION: ? ?Gen: NAD, conversant, well nourised, well groomed                     ?Cardiovascular: Regular rate rhythm, no peripheral edema, warm, nontender. ?Eyes: Conjunctivae clear without exudates or hemorrhage ?Neck: Supple, no carotid bruits. ?Pulmonary: Clear to auscultation bilaterally  ? ?NEUROLOGICAL EXAM: ? ?MENTAL STATUS: ?Speech/cognition: ?Awake, alert, oriented to history taking and casual conversation, slow spastic dysarthria ? ? ?  05/08/2022  ? 10:16 AM  ?Montreal Cognitive Assessment   ?Visuospatial/ Executive (0/5) 4  ?Naming (0/3) 1  ?Attention: Read list of digits (0/2) 2  ?Attention: Read list of letters (0/1) 1  ?Attention: Serial 7 subtraction starting at 100 (0/3) 3  ?Language: Repeat phrase (0/2) 2  ?Language : Fluency (0/1) 0  ?Abstraction (0/2) 1  ?Delayed Recall (0/5) 2  ?Orientation (0/6) 5  ?Total 21  ?Adjusted Score (based on education) 21  ?  ?CRANIAL NERVES: ?CN II: Visual fields are full to confrontation. Pupils are round equal and briskly reactive to light. ?CN III,  IV, VI: extraocular movement are normal. No ptosis. ?CN V: Facial sensation is intact to light touch ?CN VII: Face is symmetric, mild eye closure, moderate cheek puff weakness ?CN VIII: Hearing is normal to causal  conversation. ?CN IX, X: Phonation is normal. ?CN XI: Head turning and shoulder shrug are intact ?CN XII: No significant tongue atrophy noticed, mild bilateral lateral tongue area muscle fasciculation, ? ?MOTOR: Mild right more than left upper extremity proximal muscle weakness, no significant distal weakness, mild bilateral hip flexion weakness, significant left ankle dorsiflexion, plantarflexion weakness, left worse than right, ? ?REFLEXES: ?Reflexes are 3 and symmetric at the biceps, triceps, knees, and ankles. Plantar responses are bilaterally ? ?SENSORY: ?Intact to light touch, pinprick and vibratory sensation are intact in fingers and toes. ? ?COORDINATION: ?There is no trunk or limb dysmetria noted. ? ?GAIT/STANCE: Needs push-up to get up from seated position, steppage gait bilaterally, unsteady, left worse than right, ? ?REVIEW OF SYSTEMS:  ?Full 14 system review of systems performed and notable only for as above ?All other review of systems were negative. ? ? ?ALLERGIES: ?No Known Allergies ? ?HOME MEDICATIONS: ?Current Outpatient Medications  ?Medication Sig Dispense Refill  ? acetaminophen (TYLENOL) 325 MG tablet Take 650 mg by mouth every 6 (six) hours as needed for moderate pain.     ? atorvastatin (LIPITOR) 20 MG tablet Take 20 mg by mouth daily.     ? clopidogrel (PLAVIX) 75 MG tablet Take 75 mg by mouth daily with breakfast.    ? lipase/protease/amylase (CREON) 12000-38000 units CPEP capsule Take 36,000 Units by mouth. 2 with each meal and 1 with each snack    ? Polyethyl Glycol-Propyl Glycol (SYSTANE OP) Place 1 drop into both eyes 2 (two) times daily as needed (dry eyes).     ? RESTASIS 0.05 % ophthalmic emulsion Place 1 drop into both eyes 2 (two) times daily.    ? ?No current facility-administered medications for this visit.  ? ? ?PAST MEDICAL HISTORY: ?Past Medical History:  ?Diagnosis Date  ? Allergic rhinitis   ? Arthritis   ? Cancer [facility])   ? Breast- left  ? CKD (chronic kidney disease)   ?  stage III  ? Colon polyp   ? Dyspnea   ? GERD (gastroesophageal reflux disease)   ? History of hiatal hernia   ? Hyperlipidemia   ? Personal history of radiation therapy   ? Stroke [facility])   ? TIA before 2013  ? TIA (transient ischemic attack)   ? Vitamin D deficiency   ? ? ?PAST SURGICAL HISTORY: ?Past Surgical History:  ?Procedure Laterality Date  ? APPENDECTOMY    ? BREAST LUMPECTOMY Left 2006  ? BREAST SURGERY    ? 2006...treated with Tamoxifen for 6 years  ? CHOLECYSTECTOMY    ? COLONOSCOPY    ? ESOPHAGOGASTRODUODENOSCOPY N/A 09/01/2020  ? Procedure: ESOPHAGOGASTRODUODENOSCOPY (EGD);  Surgeon: Lajuana Matte, MD;  Location: [place];  Service: Thoracic;  Laterality: N/A;  ? XI ROBOTIC ASSISTED PARAESOPHAGEAL HERNIA REPAIR N/A 09/01/2020  ? Procedure: XI ROBOTIC ASSISTED HIATAL HERNIA REPAIR WITH FUNDOPLICATION AND GASTROPEXY;  Surgeon: Lajuana Matte, MD;  Location: [place];  Service: Thoracic;  Laterality: N/A;  ? ? ?FAMILY HISTORY: ?Family History  ?Problem Relation Age of Onset  ? Breast cancer Sister   ? Heart attack Sister   ? Heart disease Father   ? Stroke Father   ? Heart disease Maternal Uncle   ? Heart disease Maternal Uncle   ?  Heart disease Maternal Uncle   ? Stroke Mother   ? Deep vein thrombosis Mother   ? ? ?SOCIAL HISTORY: ?Social History  ? ?Socioeconomic History  ? Marital status: Married  ?  Spouse name: Not on file  ? Number of children: Not on file  ? Years of education: Not on file  ? Highest education level: Not on file  ?Occupational History  ? Occupation: Sales  ?Tobacco Use  ? Smoking status: Never  ? Smokeless tobacco: Never  ?Vaping Use  ? Vaping Use: Never used  ?Substance and Sexual Activity  ? Alcohol use: Yes  ?  Alcohol/week: 0.0 standard drinks  ?  Comment: 1 drink -every few weeks  ? Drug use: No  ? Sexual activity: Not on file  ?Other Topics Concern  ? Not on file  ?Social History Narrative  ? Drinks about 2 cups of coffee a day   ? ?Social Determinants of Health   ? ?Financial Resource Strain: Not on file  ?Food Insecurity: Not on file  ?Transportation Needs: Not on file  ?Physical Activity: Not on file  ?Stress: Not on file  ?Social Connections: Not on file  ?Intimate Partner Violence: Not on file

## 2022-05-08 NOTE — Telephone Encounter (Signed)
medicare/bcbs fed sent to [facility] they obtain auth and call patient to schedule 

## 2022-05-09 ENCOUNTER — Telehealth: Payer: Self-pay | Admitting: Neurology

## 2022-05-09 NOTE — Telephone Encounter (Signed)
Order for NCV/EMG sent to [facility] [phone]. 

## 2022-05-20 ENCOUNTER — Ambulatory Visit
Admission: RE | Admit: 2022-05-20 | Discharge: 2022-05-20 | Disposition: A | Discharge status: Home / Self Care | Payer: Medicare Other | Source: Ambulatory Visit | Attending: Neurology | Admitting: Neurology

## 2022-05-20 DIAGNOSIS — R471 Dysarthria and anarthria: Secondary | ICD-10-CM

## 2022-05-20 DIAGNOSIS — R270 Ataxia, unspecified: Secondary | ICD-10-CM | POA: Diagnosis not present

## 2022-05-20 DIAGNOSIS — M21372 Foot drop, left foot: Secondary | ICD-10-CM

## 2022-05-20 DIAGNOSIS — R531 Weakness: Secondary | ICD-10-CM

## 2022-05-20 DIAGNOSIS — M50221 Other cervical disc displacement at C4-C5 level: Secondary | ICD-10-CM | POA: Diagnosis not present

## 2022-05-20 DIAGNOSIS — M47812 Spondylosis without myelopathy or radiculopathy, cervical region: Secondary | ICD-10-CM | POA: Diagnosis not present

## 2022-05-20 DIAGNOSIS — S199XXA Unspecified injury of neck, initial encounter: Secondary | ICD-10-CM | POA: Diagnosis not present

## 2022-05-20 IMAGING — MR MR HEAD W/O CM
10 series · 48 of 48 positions shown · non-contrast
Comparison: Head CT [DATE]

CLINICAL DATA: Memory loss.  Ataxia.  Weakness.

EXAM:
MRI HEAD WITHOUT CONTRAST
TECHNIQUE: Multiplanar, multiecho pulse sequences of the brain and surrounding
structures were obtained without intravenous contrast.

[Series 2: T1 · sagittal · 5.0mm · 0.49mm/px · 3 of 24 slices shown]
[im 1/24]
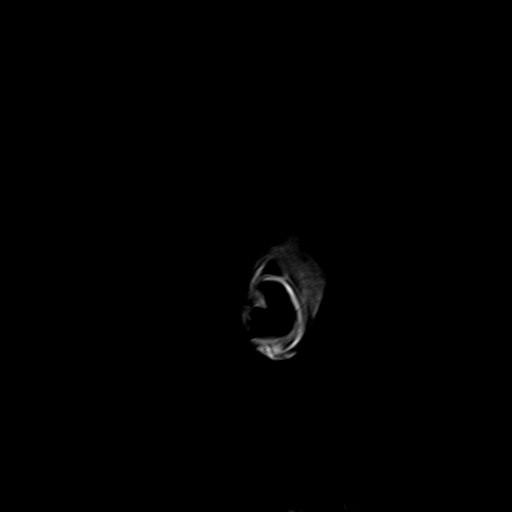
[im 12/24]
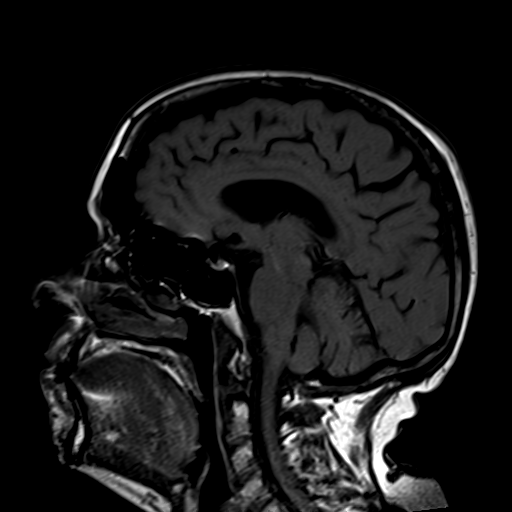
[im 24/24]
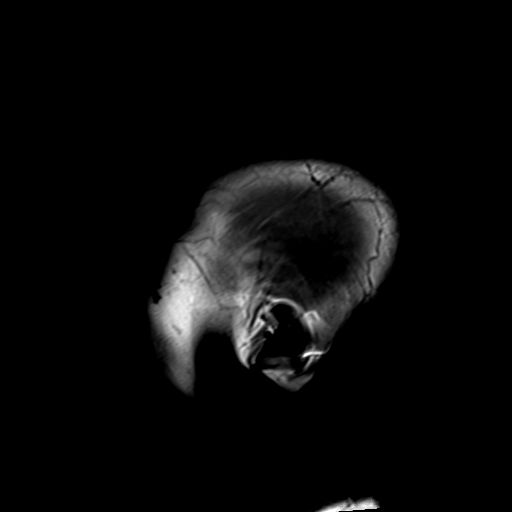

[Series 3: ax ep2d_diff_3 · axial · 3.0mm · 1.80mm/px · z∈[-43,+94]mm · 9 of 101 slices shown]
[im 1/101]
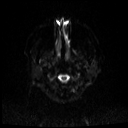
[im 13/101]
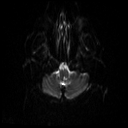
[im 26/101]
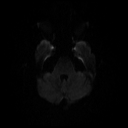
[im 38/101]
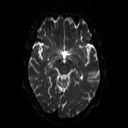
[im 51/101]
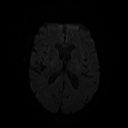
[im 63/101]
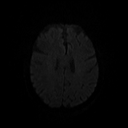
[im 76/101]
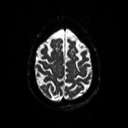
[im 88/101]
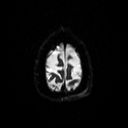
[im 101/101]
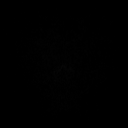

[Series 4: ax ep2d_diff_3_adc · axial · 3.0mm · 1.80mm/px · z∈[-43,+94]mm · 4 of 51 slices shown]
[im 1/51]
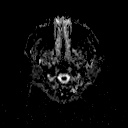
[im 17/51]
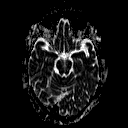
[im 34/51]
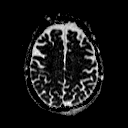
[im 51/51]
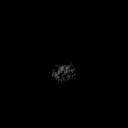

[Series 5: cor ep2d_diff · coronal · 5.0mm · 1.77mm/px · 5 of 56 slices shown]
[im 1/56]
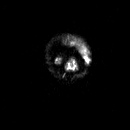
[im 14/56]
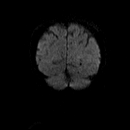
[im 28/56]
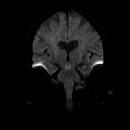
[im 42/56]
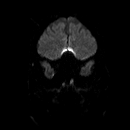
[im 56/56]
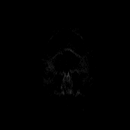

[Series 6: cor ep2d_diff_adc · coronal · 5.0mm · 1.77mm/px · 2 of 28 slices shown]
[im 1/28]
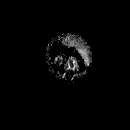
[im 28/28]
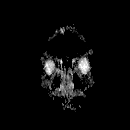

[Series 8: swi_images · axial · 2.0mm · 0.98mm/px · z∈[-32,+98]mm · 6 of 72 slices shown]
[im 1/72]
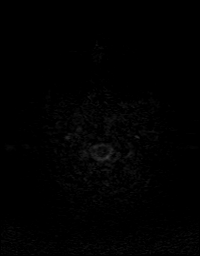
[im 15/72]
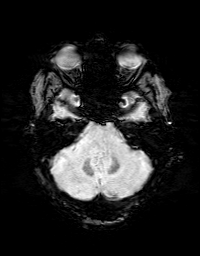
[im 29/72]
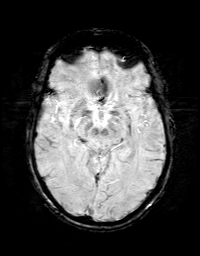
[im 43/72]
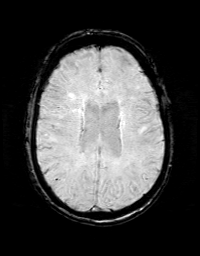
[im 57/72]
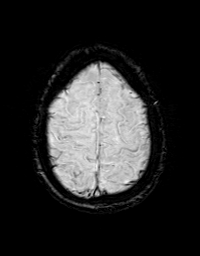
[im 72/72]
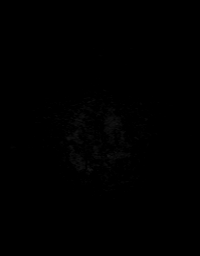

[Series 9: FLAIR · axial · 3.0mm · 0.45mm/px · z∈[-41,+98]mm · 3 of 40 slices shown]
[im 1/40]
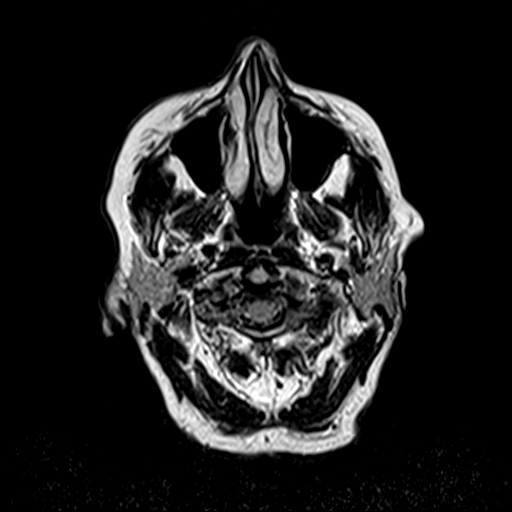
[im 20/40]
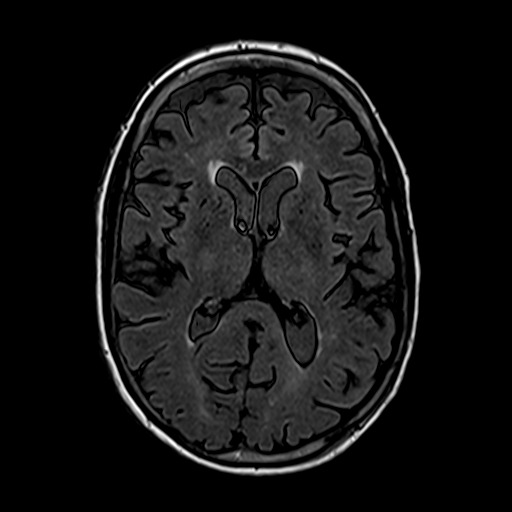
[im 40/40]
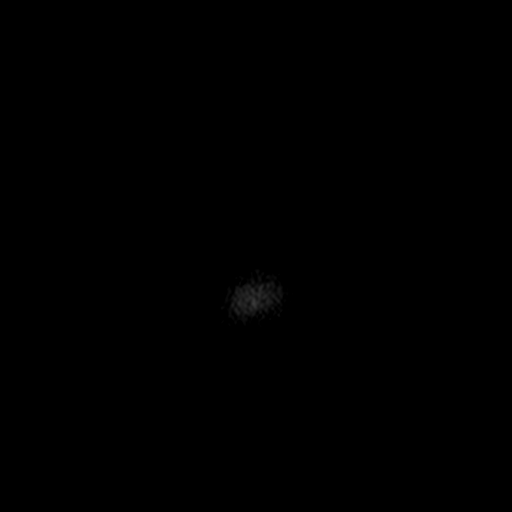

[Series 10: T2 · axial · 5.0mm · 0.65mm/px · z∈[-36,+101]mm · 2 of 26 slices shown (1 of 2)]
[im 1/26]
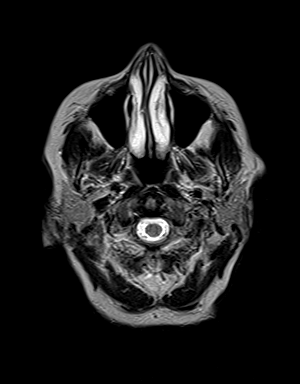
[im 26/26]
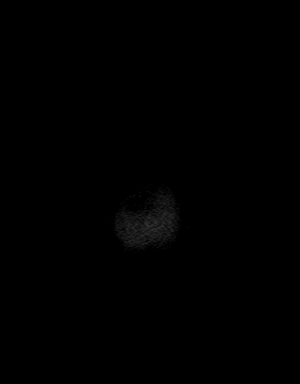

[Series 11: t1_mpr_tra · axial · 1.0mm · 0.72mm/px · z∈[-37,+94]mm · 12 of 144 slices shown]
[im 1/144]
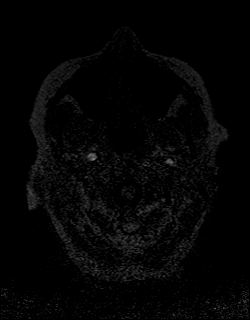
[im 14/144]
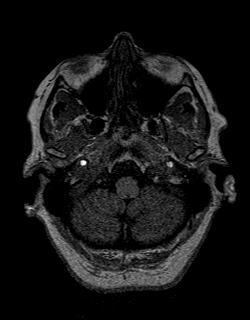
[im 27/144]
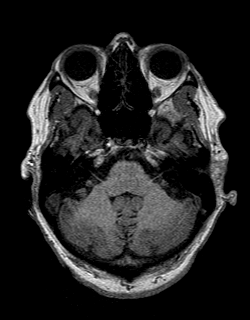
[im 40/144]
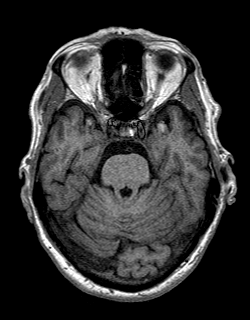
[im 53/144]
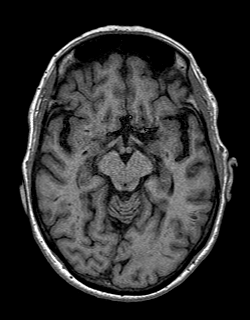
[im 66/144]
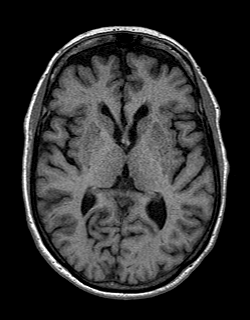
[im 79/144]
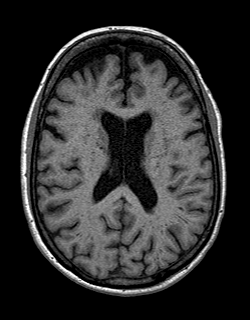
[im 92/144]
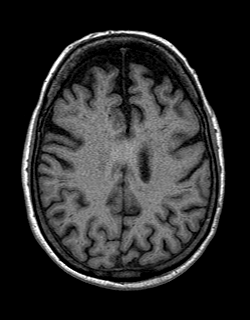
[im 105/144]
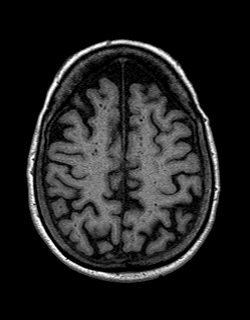
[im 118/144]
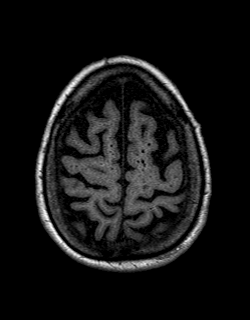
[im 131/144]
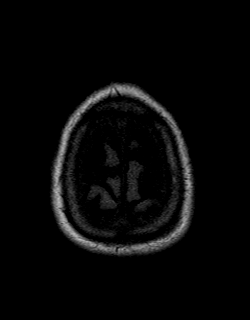
[im 144/144]
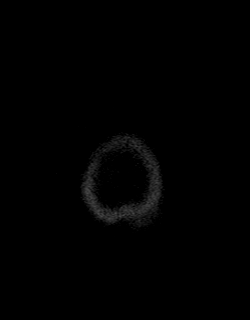

[Series 12: T2 · coronal · 5.0mm · 0.43mm/px · 2 of 29 slices shown (2 of 2)]
[im 1/29]
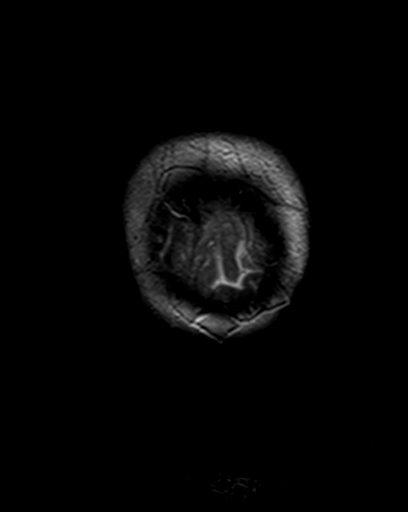
[im 29/29]
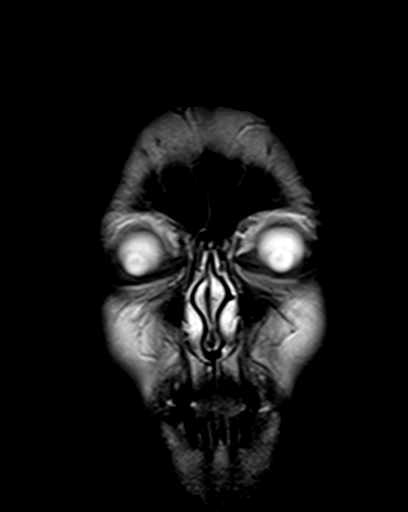

[48 of 48 positions shown; findings below may reference images not displayed]

FINDINGS: Brain: Diffusion imaging does not show any acute or subacute
infarction. There is generalized brain volume loss. No focal
abnormality affects the brainstem or cerebellum. Cerebral
hemispheres show mild chronic small-vessel ischemic change of the
white matter. Dilated perivascular spaces incidentally noted. No
cortical or large vessel territory infarction. No mass lesion,
hemorrhage, hydrocephalus or extra-axial collection.

Vascular: Major vessels at the base of the brain show flow.

Skull and upper cervical spine: Negative

Sinuses/Orbits: Clear/normal

Other: None
IMPRESSION: No acute or reversible finding. Age related volume loss. Mild
chronic small-vessel ischemic change of the cerebral hemispheric
white matter.

## 2022-05-20 IMAGING — MR MR CERVICAL SPINE W/O CM
4 of 5 series · 30 of 48 positions shown · non-contrast
Comparison: CT [DATE].  MRI [DATE]

CLINICAL DATA: Ataxia, nontraumatic. Cervical pathology suspected.
Difficulty walking. Falling.

EXAM:
MRI CERVICAL SPINE WITHOUT CONTRAST
TECHNIQUE: Multiplanar, multisequence MR imaging of the cervical spine was
performed. No intravenous contrast was administered.

[Series 2: T2 · sagittal · 3.0mm · 0.72mm/px · 8 of 16 slices shown (1 of 2)]
[im 1/16]
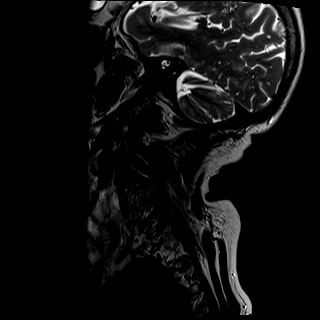
[im 3/16]
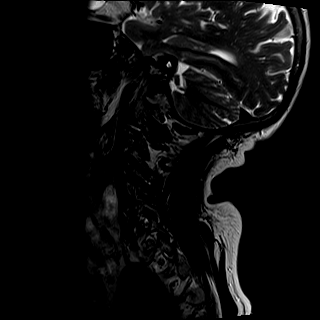
[im 5/16]
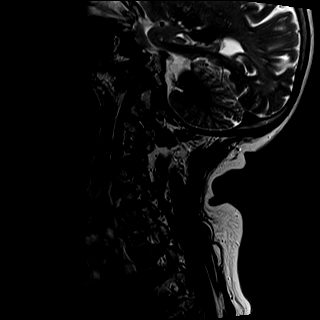
[im 7/16]
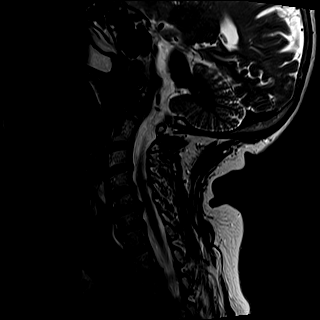
[im 9/16]
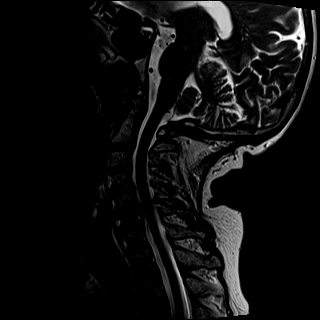
[im 11/16]
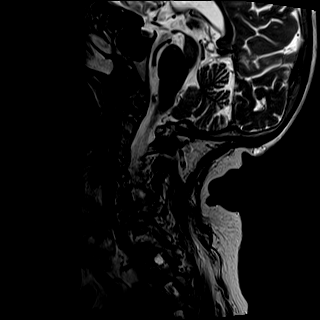
[im 13/16]
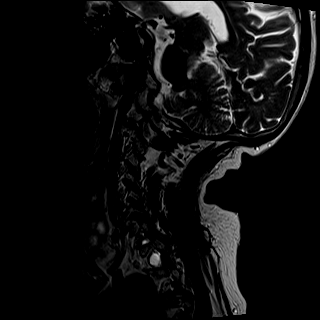
[im 16/16]
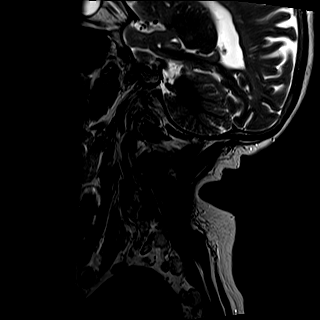

[Series 3: T1 · sagittal · 3.0mm · 0.45mm/px · 8 of 16 slices shown]
[im 1/16]
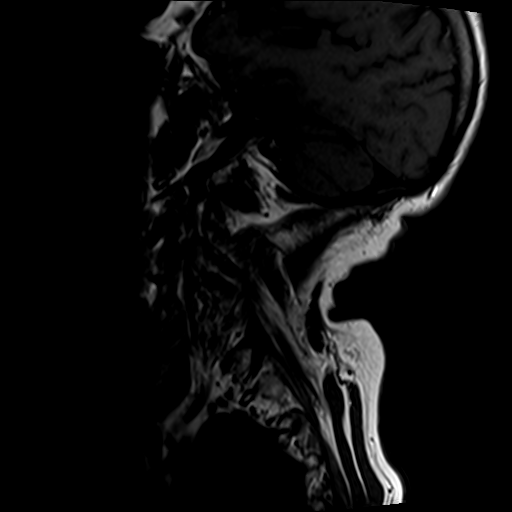
[im 3/16]
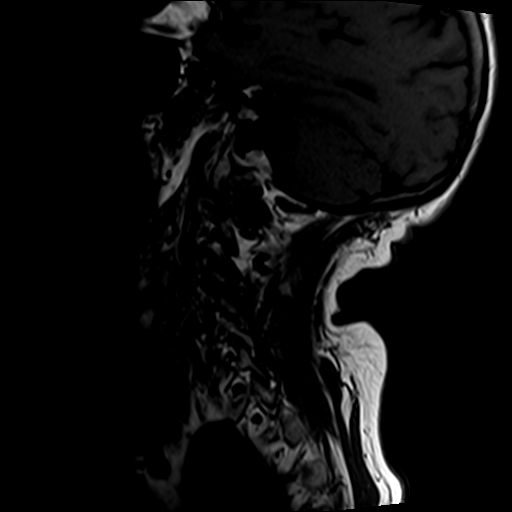
[im 5/16]
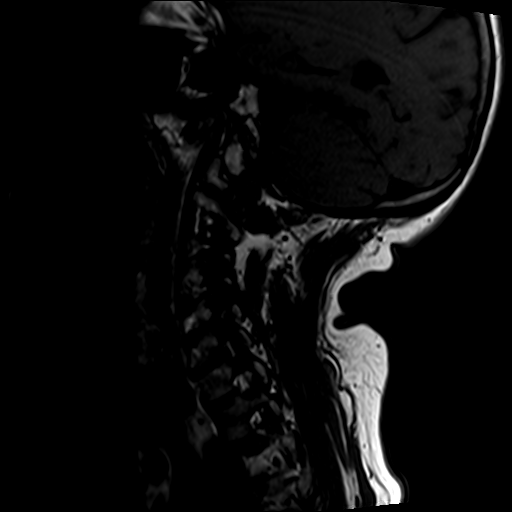
[im 7/16]
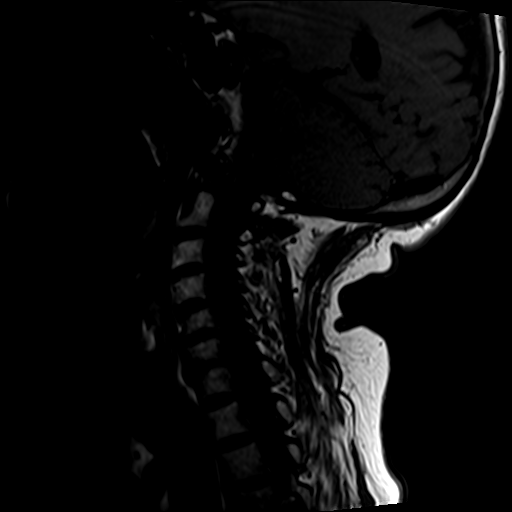
[im 9/16]
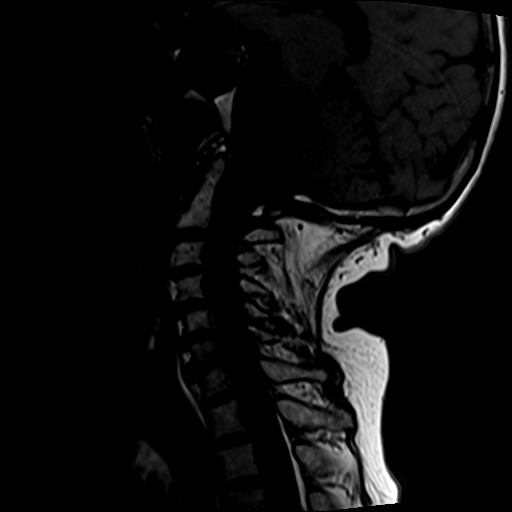
[im 11/16]
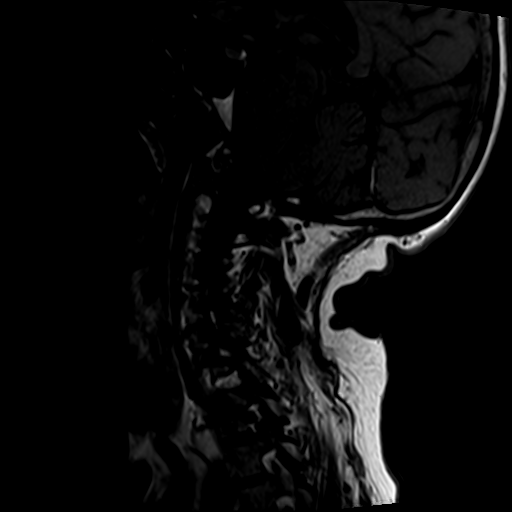
[im 13/16]
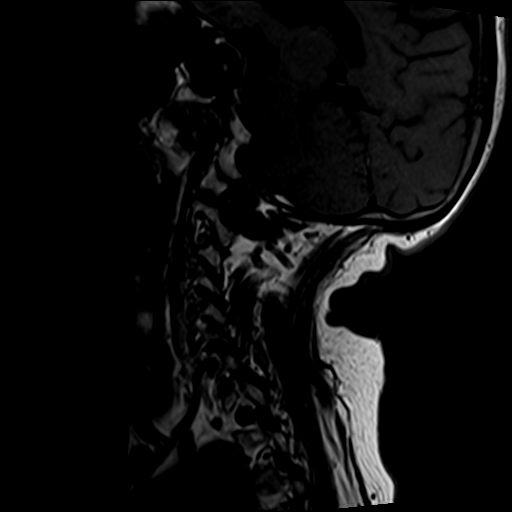
[im 16/16]
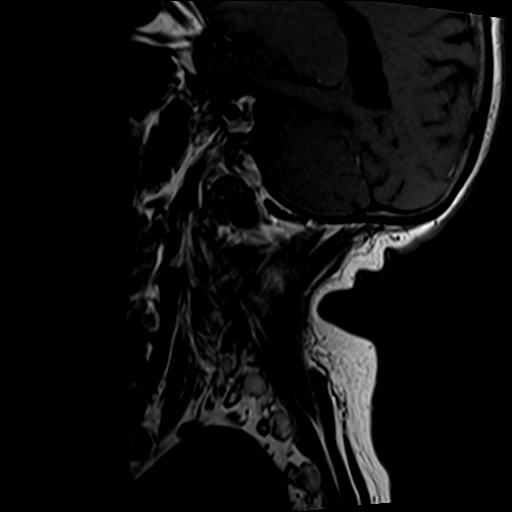

[Series 4: tir sag · sagittal · 3.0mm · 0.45mm/px · 5 of 16 slices shown]
[im 1/16]
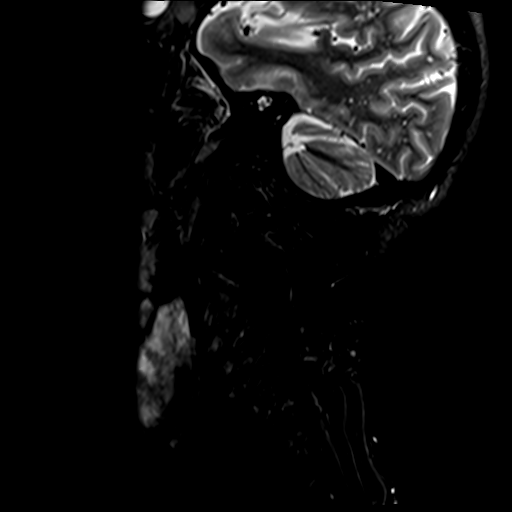
[im 3/16]
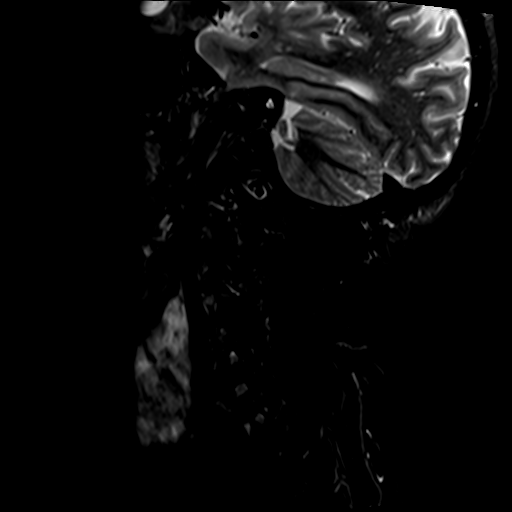
[im 5/16]
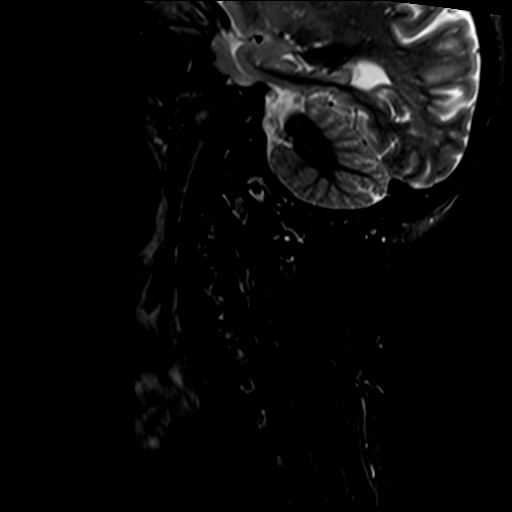
[im 9/16]
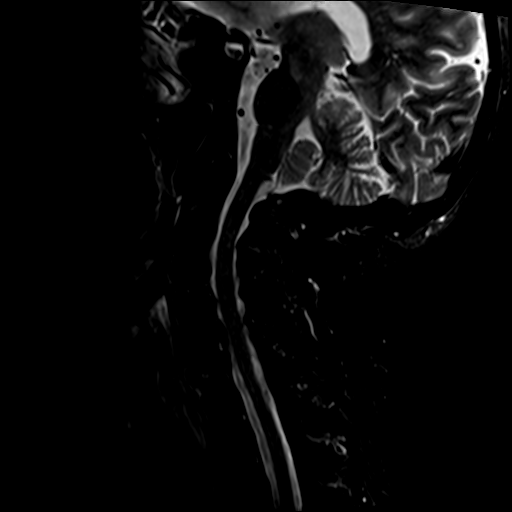
[im 13/16]
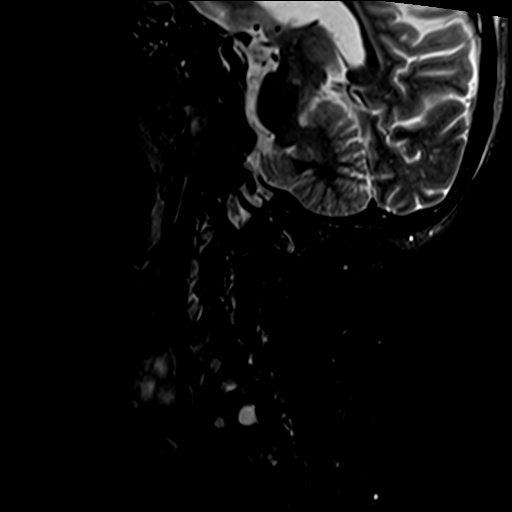

[Series 8: T2 · axial · 3.0mm · 0.90mm/px · z∈[-252,-171]mm · 9 of 25 slices shown (2 of 2)]
[im 1/25]
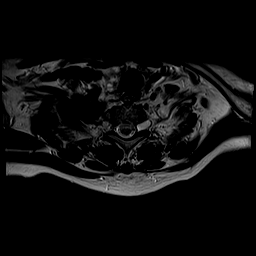
[im 5/25]
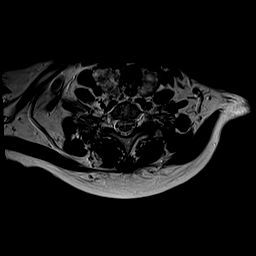
[im 7/25]
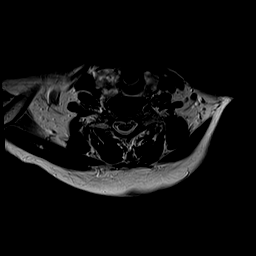
[im 11/25]
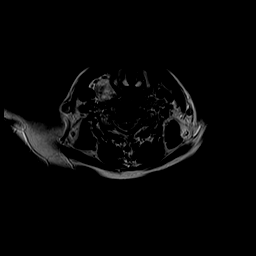
[im 14/25]
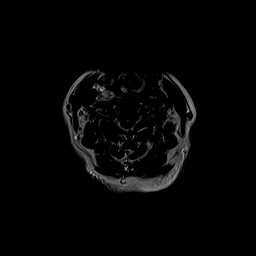
[im 18/25]
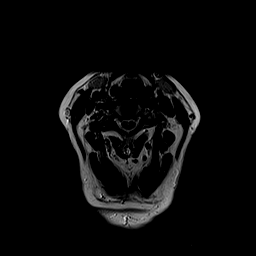
[im 20/25]
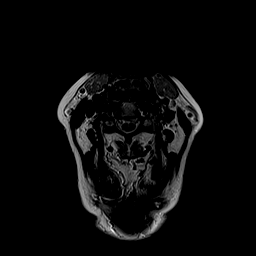
[im 22/25]
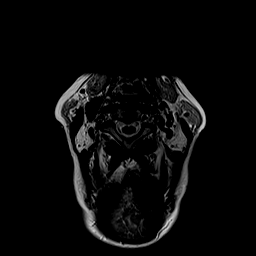
[im 25/25]
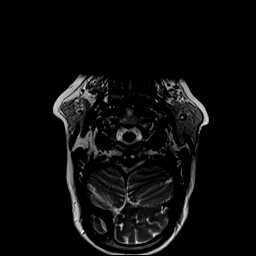

[30 of 48 positions shown; findings below may reference images not displayed]

FINDINGS: Alignment: Normal

Vertebrae: No fracture or focal bone lesion.

Cord: No cord compression or focal cord lesion.

Posterior Fossa, vertebral arteries, paraspinal tissues: Chronic
thyroid goiter previously evaluated by ultrasound, most recently
[DATE].

Disc levels:

Foramen magnum widely patent. No significant finding at C1-2 or
C2-3.

C3-4: Mild bulging of the disc. No compressive canal or foraminal
narrowing.

C4-5: Mild bulging of the disc. No compressive canal or foraminal
narrowing.

C5-6: Endplate osteophytes and shallow protrusion of the disc.
Narrowing of the ventral subarachnoid space but no compression of
the cord. Bilateral foraminal narrowing that could affect either C6
nerve.

C6-7: Endplate osteophytes and mild bulging of the disc. No
compressive canal or foraminal narrowing.

C7-T1: Normal interspace.
IMPRESSION: No significant change noted since the study of [OF]. Widespread
cervical spondylosis. No compressive canal stenosis, cord
compression or cord lesion. Bilateral foraminal narrowing at C5-6
would have some potential to affect the exiting C6 nerves.

## 2022-05-28 DIAGNOSIS — Z86010 Personal history of colonic polyps: Secondary | ICD-10-CM | POA: Diagnosis not present

## 2022-05-28 DIAGNOSIS — R197 Diarrhea, unspecified: Secondary | ICD-10-CM | POA: Diagnosis not present

## 2022-05-28 LAB — ACETYLCHOLINE RECEPTOR AB, ALL
AChR Binding Ab, Serum: <0.03 nmol/L (ref 0.00–0.24)
AChR-modulating Ab: 0 % (ref 0–45)
Acetylchol Block Ab: 19 % (ref 0–25)

## 2022-05-28 LAB — MULTIPLE MYELOMA PANEL, SERUM
Albumin SerPl Elph-Mcnc: 3.6 g/dL (ref 2.9–4.4)
Albumin/Glob SerPl: 1.3 (ref 0.7–1.7)
Alpha 1: 0.2 g/dL (ref 0.0–0.4)
Alpha2 Glob SerPl Elph-Mcnc: 0.8 g/dL (ref 0.4–1.0)
B-Globulin SerPl Elph-Mcnc: 0.8 g/dL (ref 0.7–1.3)
Gamma Glob SerPl Elph-Mcnc: 1 g/dL (ref 0.4–1.8)
Globulin, Total: 2.8 g/dL (ref 2.2–3.9)
IgA/Immunoglobulin A, Serum: 152 mg/dL (ref 64–422)
IgG (Immunoglobin G), Serum: 862 mg/dL (ref 586–1602)
IgM (Immunoglobulin M), Srm: 244 mg/dL — ABNORMAL HIGH (ref 26–217)
Total Protein: 6.4 g/dL (ref 6.0–8.5)

## 2022-05-28 LAB — VITAMIN D 25 HYDROXY (VIT D DEFICIENCY, FRACTURES): Vit D, 25-Hydroxy: 32.5 ng/mL (ref 30.0–100.0)

## 2022-05-28 LAB — CK: Total CK: 268 U/L — ABNORMAL HIGH (ref 32–182)

## 2022-05-28 LAB — C-REACTIVE PROTEIN: CRP: 1 mg/L (ref 0–10)

## 2022-05-28 LAB — ANA W/REFLEX IF POSITIVE: Anti Nuclear Antibody (ANA): NEGATIVE

## 2022-05-28 LAB — FOLATE: Folate: >20 ng/mL (ref >3.0)

## 2022-05-28 LAB — COPPER, SERUM: Copper: 102 ug/dL (ref 80–158)

## 2022-05-28 LAB — SEDIMENTATION RATE: Sed Rate: 6 mm/hr (ref 0–40)

## 2022-05-28 LAB — VITAMIN B12: Vitamin B-12: 771 pg/mL (ref 232–1245)

## 2022-05-28 LAB — TSH: TSH: 1.18 u[IU]/mL (ref 0.450–4.500)

## 2022-05-28 LAB — RPR: RPR Ser Ql: NONREACTIVE

## 2022-05-30 ENCOUNTER — Encounter: Payer: Self-pay | Admitting: Neurology

## 2022-05-30 ENCOUNTER — Ambulatory Visit (INDEPENDENT_AMBULATORY_CARE_PROVIDER_SITE_OTHER): Payer: Medicare Other | Admitting: Neurology

## 2022-05-30 DIAGNOSIS — R499 Unspecified voice and resonance disorder: Secondary | ICD-10-CM

## 2022-05-30 DIAGNOSIS — G1220 Motor neuron disease, unspecified: Secondary | ICD-10-CM

## 2022-05-30 DIAGNOSIS — R471 Dysarthria and anarthria: Secondary | ICD-10-CM

## 2022-05-30 DIAGNOSIS — M21372 Foot drop, left foot: Secondary | ICD-10-CM

## 2022-05-30 DIAGNOSIS — K903 Pancreatic steatorrhea: Secondary | ICD-10-CM

## 2022-05-30 DIAGNOSIS — R269 Unspecified abnormalities of gait and mobility: Secondary | ICD-10-CM

## 2022-05-30 DIAGNOSIS — R531 Weakness: Secondary | ICD-10-CM

## 2022-05-30 DIAGNOSIS — R634 Abnormal weight loss: Secondary | ICD-10-CM | POA: Diagnosis not present

## 2022-05-30 NOTE — Progress Notes (Signed)
No chief complaint on file.     ASSESSMENT AND PLAN  Yvonne Jordan is a 76 y.o. female   Motor neuron disease Presented with gradual onset of painless muscle atrophy, weakness, unsteady gait, slurred speech She has evidence of bulbar involvement, mild weak cough, apparent slurred speech, I have offered to discuss the diagnosis with her husband and 2 children who live out of state, she declined, MRI of brain, cervical, lumbar and thoracic spine showed no structural abnormality to explain above findings, Laboratory evaluation showed no treatable etiology EMG nerve conduction study showed widespread chronic neuropathic changes involving right bulbar, cervical, thoracic, bilateral lumbosacral myotomes, consistent with diagnosis of motor neuron disease, Examination showed combination of lower motor neuron and upper motor neuron signs with bilateral Babinski signs, More extensive evaluation to rule out treatable etiology including CT abdomen chest, pelvic to rule out tumor More extensive laboratory evaluations Return to clinic in 6 months or call clinic for worsening symptoms  DIAGNOSTIC DATA (LABS, IMAGING, TESTING) - I reviewed patient records, labs, notes, testing and imaging myself where available.  Laboratory evaluation in March 2023, normal CMP, creatinine of 0.96, CBC hemoglobin of 12  MEDICAL HISTORY:  Yvonne Jordan is a 76 year old female, seen in request by her primary care physician Dr. Antony Contras, for evaluation of left foot drop, dysarthria, mild memory loss, initial evaluation was on May 08, 2022  I reviewed and summarized the referring note. PMHx. HLD Left breast Cancer, s/p left lobectomy, radiation. Chronic kidney disease,  GERD TIA- 20 years ago, presented with right facial numbness, dysarthria, Hiatal Hernia surgery,    She was seen by cardiothoracic surgeon for evaluation of chronic aspiration, hiatal hernia, aspiration pneumonia, had robotic  assisted laparoscopic surgery in September 2021, following that surgery, she reported gradual onset of language difficulty, slurred speech, gradually getting worse over the past couple years, she continue has mild swallowing difficulty, choking on food occasionally,  At the beginning of the interview, she denied leg muscle weakness, on further questioning, she actually developed profound left foot drop, gait abnormality, has fell multiple times, wearing left AFO,  She denies significant pain, no sensory change, no incontinence, no double vision, she is the main caregiver of her husband, who suffered significant gait abnormality per patient due to lower extremity arthritis, and also memory loss, she has 2 adult children live in [place] and [place],  Update June 06/2022: Patient gives a poor story line, today she reported about 18 months ago in January 2022, when she was attending her father-in-law's funeral, equipment bumped into her left shin area, she suffered left leg pain for a while, ever since then, she began to notice left foot drop, gradually getting worse over the past few months especially since 2023, she began to use AFO which has helped her, since March 2023 she also developed slow worsening slurred speech, she denies difficulty swallowing denies sensory loss  We personally reviewed MRI of the brain without contrast, no acute abnormality, scattered small vessel disease, no acute change compared to previous MRI in 2017  MRI of cervical spine mild degenerative changes no evidence of significant canal or foraminal narrowing  MRI of thoracic spine no significant abnormality  MRI of lumbar spine multilevel degenerative changes, most noticeable at L4-5, mild spinal stenosis, moderate right foraminal narrowing, moderate bilateral lateral recess stenosis,  Extensive laboratory evaluation showed normal or negative acetylcholine receptor antibody, copper, vitamin D, protein electrophoresis, ANA,  ESR, C-reactive protein, TSH, folic acid, RPR J24, elevated  CPK 268  EMG nerve conduction study today May 30, 2022 showed evidence of widespread chronic neuropathic changes involving right sternocleidomastoid, genioglossus, right cervical, bilateral lumbar sacral myotomes, also evidence of active denervation at mid and lower thoracic paraspinal muscles.  Above findings most supportive of motor neuron disease  PHYSICAL EXAM:   There were no vitals filed for this visit.  PHYSICAL EXAMNIATION:  Gen: NAD, conversant, well nourised, well groomed                     Cardiovascular: Regular rate rhythm, no peripheral edema, warm, nontender. Eyes: Conjunctivae clear without exudates or hemorrhage Neck: Supple, no carotid bruits. Pulmonary: Clear to auscultation bilaterally   NEUROLOGICAL EXAM:  MENTAL STATUS: Speech/cognition: Awake, alert, oriented to history taking and casual conversation, slow spastic dysarthria     05/08/2022   10:16 AM  Montreal Cognitive Assessment   Visuospatial/ Executive (0/5) 4  Naming (0/3) 1  Attention: Read list of digits (0/2) 2  Attention: Read list of letters (0/1) 1  Attention: Serial 7 subtraction starting at 100 (0/3) 3  Language: Repeat phrase (0/2) 2  Language : Fluency (0/1) 0  Abstraction (0/2) 1  Delayed Recall (0/5) 2  Orientation (0/6) 5  Total 21  Adjusted Score (based on education) 21    CRANIAL NERVES: CN II: Visual fields are full to confrontation. Pupils are round equal and briskly reactive to light. CN III, IV, VI: extraocular movement are normal. No ptosis. CN V: Facial sensation is intact to light touch CN VII: Face is symmetric, mild eye closure, moderate cheek puff weakness CN VIII: Hearing is normal to causal conversation. CN IX, X: Phonation is normal. CN XI: Head turning and shoulder shrug are intact CN XII: No significant tongue atrophy noticed, mild bilateral lateral tongue area muscle fasciculation,  MOTOR: Mild neck  flexion weakness,   UE Shoulder Abduction Shoulder External Rotation Elbow Flexion Elbow  Extension Wrist Flexion Wrist Extension Grip  R 4 4 4  4- 5 4 4+  L 4 4 4  4- 5 4 4+   LE Hip Flexion Knee flexion Knee extension Ankle Dorsiflexion Ankle plantar Flexion  R 4 5 5 4 5   L 4 5 4+ 3 4    REFLEXES: Reflexes are 3 and symmetric at the biceps, triceps, knees, and ankles. Plantar responses are extensor bilaterally  SENSORY: Intact to light touch, pinprick and vibratory sensation are intact in fingers and toes.  COORDINATION: There is no trunk or limb dysmetria noted.  GAIT/STANCE: Needs push-up to get up from seated position, steppage gait bilaterally, left worse than right, unsteady, improved to his left ankle brace  REVIEW OF SYSTEMS:  Full 14 system review of systems performed and notable only for as above All other review of systems were negative.   ALLERGIES: No Known Allergies  HOME MEDICATIONS: Current Outpatient Medications  Medication Sig Dispense Refill   acetaminophen (TYLENOL) 325 MG tablet Take 650 mg by mouth every 6 (six) hours as needed for moderate pain.      atorvastatin (LIPITOR) 20 MG tablet Take 20 mg by mouth daily.      clopidogrel (PLAVIX) 75 MG tablet Take 75 mg by mouth daily with breakfast.     lipase/protease/amylase (CREON) 12000-38000 units CPEP capsule Take 36,000 Units by mouth. 2 with each meal and 1 with each snack     Polyethyl Glycol-Propyl Glycol (SYSTANE OP) Place 1 drop into both eyes 2 (two) times daily as needed (dry eyes).  RESTASIS 0.05 % ophthalmic emulsion Place 1 drop into both eyes 2 (two) times daily.     No current facility-administered medications for this visit.    PAST MEDICAL HISTORY: Past Medical History:  Diagnosis Date   Allergic rhinitis    Arthritis    Cancer ([place])    Breast- left   CKD (chronic kidney disease)    stage III   Colon polyp    Dyspnea    GERD (gastroesophageal reflux disease)    History  of hiatal hernia    Hyperlipidemia    Personal history of radiation therapy    Stroke [facility])    TIA before 2013   TIA (transient ischemic attack)    Vitamin D deficiency     PAST SURGICAL HISTORY: Past Surgical History:  Procedure Laterality Date   APPENDECTOMY     BREAST LUMPECTOMY Left 2006   BREAST SURGERY     2006...treated with Tamoxifen for 6 years   CHOLECYSTECTOMY     COLONOSCOPY     ESOPHAGOGASTRODUODENOSCOPY N/A 09/01/2020   Procedure: ESOPHAGOGASTRODUODENOSCOPY (EGD);  Surgeon: Lajuana Matte, MD;  [facility];  Service: Thoracic;  Laterality: N/A;   XI ROBOTIC ASSISTED PARAESOPHAGEAL HERNIA REPAIR N/A 09/01/2020   Procedure: XI ROBOTIC ASSISTED HIATAL HERNIA REPAIR WITH FUNDOPLICATION AND GASTROPEXY;  Surgeon: Lajuana Matte, MD;  Location: [place];  Service: Thoracic;  Laterality: N/A;    FAMILY HISTORY: Family History  Problem Relation Age of Onset   Breast cancer Sister    Heart attack Sister    Heart disease Father    Stroke Father    Heart disease Maternal Uncle    Heart disease Maternal Uncle    Heart disease Maternal Uncle    Stroke Mother    Deep vein thrombosis Mother     SOCIAL HISTORY: Social History   Socioeconomic History   Marital status: Married    Spouse name: Not on file   Number of children: Not on file   Years of education: Not on file   Highest education level: Not on file  Occupational History   Occupation: Sales  Tobacco Use   Smoking status: Never   Smokeless tobacco: Never  Vaping Use   Vaping Use: Never used  Substance and Sexual Activity   Alcohol use: Yes    Alcohol/week: 0.0 standard drinks    Comment: 1 drink -every few weeks   Drug use: No   Sexual activity: Not on file  Other Topics Concern   Not on file  Social History Narrative   Drinks about 2 cups of coffee a day    Social Determinants of Health   Financial Resource Strain: Not on file  Food Insecurity: Not on file  Transportation Needs: Not  on file  Physical Activity: Not on file  Stress: Not on file  Social Connections: Not on file  Intimate Partner Violence: Not on file      Marcial Pacas, M.D. Ph.D.  [facility] [address], [place], [place] [address] Ph: [phone] Fax: [phone]  CC:  Antony Contras, MD [address],  [place] [address]  Antony Contras, MD  ,

## 2022-05-30 NOTE — Procedures (Signed)
Full Name: Yvonne Jordan Gender: Female MRN #: [id number] Date of Birth: 06/26/1959    Visit Date: 05/09/2022 07:55 Age: 76 Years Examining Physician: Marcial Pacas Referring Physician: Marcial Pacas Height: 5 feet 11 inch Patient History: elevated CPK, weakness History: 76 year old female, present with painless muscle atrophy since beginning of 2022, gradually getting worse, left foot drop, gait abnormality, bulbar weakness, slurred speech, mild swallowing difficulty  Summary of the test:  Nerve conduction study: Right sural, superficial peroneal  median, and ulnar sensory responses were normal. Right median, ulnar motor responses were normal.  Right tibial motor response showed mildly decreased CMAP amplitude, otherwise with normal distal latency, conduction velocity and F-wave latency.  Electromyography: Selected needle examination was performed at bilateral lower extremity muscles, bilateral lumbosacral paraspinal muscles, right upper extremity muscles; right cervical paraspinal muscles; right thoracic paraspinal muscles; right genioglossus, right sternocleidomastoid muscles.  There is evidence of active denervation, with chronic neuropathic changes at bilateral lumbosacral myotomes, left worse than right.  There is also evidence of chronic neuropathic changes involving right cervical myotomes, with evidence of occasional fasciculation and right proximal arm muscles,   There is also noticeable chronic neuropathic changes at right sternocleidomastoid, right genioglossus  There is evidence of active denervation, increased insertional activity, complex motor unit potentials noticed at mid and lower thoracic paraspinal muscles.  Conclusion: This is an abnormal study.  There is electrodiagnostic evidence of widespread active and chronic neuropathic changes involving bilateral lumbosacral, right cervical myotomes.  In addition, there is also evidence of chronic neuropathic changes  involving the right bulbar and thoracic paraspinal muscles.  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  Differential diagnoses also include nutritional deficiency, infection, inflammatory process, paraneoplastic syndrome.  Extensive evaluation was planned.    ------------------------------- Marcial Pacas, M.D. Ph.D.  [facility] [address], [place], [place] [address] Tel: [phone] Fax: [phone]  Verbal informed consent was obtained from the patient, patient was informed of potential risk of procedure, including bruising, bleeding, hematoma formation, infection, muscle weakness, muscle pain, numbness, among others.    [place]    Nerve / Sites Muscle Latency Ref. Amplitude Ref. Rel Amp Segments Distance Velocity Ref. Area    ms ms mV mV %  cm m/s m/s mVms  R Median - APB     Wrist APB 3.5 ?4.4 3.3 ?4.0 100 Wrist - APB 7   15.5     Upper arm APB 6.9  3.2  96.1 Upper arm - Wrist 20 59 ?49 15.1  R Ulnar - ADM     Wrist ADM 2.9 ?3.3 8.1 ?6.0 100 Wrist - ADM 7   30.2     B.Elbow ADM 5.5  7.1  88.5 B.Elbow - Wrist 16 62 ?49 29.5     A.Elbow ADM 7.5  7.6  107 A.Elbow - B.Elbow 13 63 ?49 31.0  R Tibial - AH     Ankle AH 5.4 ?5.8 3.2 ?4.0 100 Ankle - AH 9   6.9     Pop fossa AH 15.7  2.5  77.5 Pop fossa - Ankle 45 43 ?41 6.5           SNC    Nerve / Sites Rec. Site Peak Lat Ref.  Amp Ref. Segments Distance    ms ms V V  cm  R Sural - Ankle (Calf)     Calf Ankle 4.0 ?4.4 6 ?6 Calf -  Ankle 14  R Superficial peroneal - Ankle     Lat leg Ankle 3.8 ?4.4 6 ?6 Lat leg - Ankle 14  R Median - Orthodromic (Dig II, Mid palm)     Dig II Wrist 3.1 ?3.4 27 ?10 Dig II - Wrist 13  R Ulnar - Orthodromic, (Dig V, Mid palm)     Dig V Wrist 2.7 ?3.1 23 ?5 Dig V - Wrist 47             F  Wave    Nerve F Lat Ref.   ms ms  R Ulnar - ADM 25.8 ?32.0  R Tibial - AH 52.4 ?56.0         EMG Summary Table     Spontaneous MUAP Recruitment  Muscle IA Fib PSW Fasc Other Amp Dur. Poly Pattern  R. Tibialis anterior Increased 1+ None None _______ Increased Increased 1+ Reduced  R. Tibialis posterior Increased 1+ None None _______ Increased Increased 1+ Reduced  R. Gastrocnemius (Medial head) Increased 1+ None None _______ Increased Increased 1+ Reduced  R. Vastus lateralis Increased None None None _______ Increased Increased 1+ Reduced  R. Lumbar paraspinals (low) Increased None None None _______ Increased Increased 1+ Normal  R. Lumbar paraspinals (mid) Increased 1+ None None _______ Increased Increased 1+ Reduced  L. Tibialis anterior Increased 1+ None None _______ Increased Increased 1+ Reduced  L. Tibialis posterior Increased 1+ None None _______ Increased Increased 1+ Reduced  L. Peroneus longus Increased 1+ None None _______ Increased Increased 1+ Reduced  L. Gastrocnemius (Medial head) Increased 1+ None None _______ Increased Increased 1+ Reduced  L. Vastus lateralis Increased None None Rare _______ Increased Increased 1+ Reduced  L. Lumbar paraspinals (low) Increased 1+ None None _______ Increased Increased 1+ Normal  L. Lumbar paraspinals (mid) Increased None None None _______ Increased Increased 1+ Normal  R. First dorsal interosseous Increased 1+ None None _______ Increased Increased 1+ Reduced  R. Pronator teres Increased 1+ None None _______ Increased Increased 1+ Reduced  R. Biceps brachii Increased None None None _______ Increased Increased 1+ Reduced  R. Deltoid Increased 1+ 1+ Rare _______ Increased Increased 1+ Reduced  R. Triceps brachii Increased 1+ 1+ Rare _______ Increased Increased 1+ Reduced  R. Cervical paraspinals Increased 1+ None None _______ Increased Increased 1+ Normal  R. Thoracic paraspinals (low) Increased 1+ None None _______ Increased Increased 1+ Normal  R. Thoracic paraspinals (mid) Increased 1+ 1+ None _______ Increased Increased Normal Normal  R.  Sternocleidomastoid Increased None None None _______ Increased Increased 1+ Reduced  R. Genioglossus Increased None None None _______ Increased Increased 1+ Reduced

## 2022-05-31 ENCOUNTER — Other Ambulatory Visit: Payer: Self-pay | Admitting: Surgery

## 2022-05-31 ENCOUNTER — Ambulatory Visit
Admission: RE | Admit: 2022-05-31 | Discharge: 2022-05-31 | Disposition: A | Discharge status: Home / Self Care | Payer: Medicare Other | Source: Ambulatory Visit | Attending: Surgery | Admitting: Surgery

## 2022-05-31 DIAGNOSIS — E042 Nontoxic multinodular goiter: Secondary | ICD-10-CM

## 2022-06-01 ENCOUNTER — Telehealth: Payer: Self-pay | Admitting: Neurology

## 2022-06-01 DIAGNOSIS — J439 Emphysema, unspecified: Secondary | ICD-10-CM | POA: Diagnosis not present

## 2022-06-01 DIAGNOSIS — E559 Vitamin D deficiency, unspecified: Secondary | ICD-10-CM | POA: Diagnosis not present

## 2022-06-01 DIAGNOSIS — E042 Nontoxic multinodular goiter: Secondary | ICD-10-CM | POA: Diagnosis not present

## 2022-06-01 DIAGNOSIS — E7800 Pure hypercholesterolemia, unspecified: Secondary | ICD-10-CM | POA: Diagnosis not present

## 2022-06-01 DIAGNOSIS — G1220 Motor neuron disease, unspecified: Secondary | ICD-10-CM | POA: Diagnosis not present

## 2022-06-01 DIAGNOSIS — I700 Atherosclerosis of aorta: Secondary | ICD-10-CM | POA: Diagnosis not present

## 2022-06-01 DIAGNOSIS — Z8673 Personal history of transient ischemic attack (TIA), and cerebral infarction without residual deficits: Secondary | ICD-10-CM | POA: Diagnosis not present

## 2022-06-01 DIAGNOSIS — Z0000 Encounter for general adult medical examination without abnormal findings: Secondary | ICD-10-CM | POA: Diagnosis not present

## 2022-06-01 DIAGNOSIS — N1831 Chronic kidney disease, stage 3a: Secondary | ICD-10-CM | POA: Diagnosis not present

## 2022-06-01 DIAGNOSIS — M85851 Other specified disorders of bone density and structure, right thigh: Secondary | ICD-10-CM | POA: Diagnosis not present

## 2022-06-01 DIAGNOSIS — Z1331 Encounter for screening for depression: Secondary | ICD-10-CM | POA: Diagnosis not present

## 2022-06-01 DIAGNOSIS — M21372 Foot drop, left foot: Secondary | ICD-10-CM | POA: Diagnosis not present

## 2022-06-01 DIAGNOSIS — K8681 Exocrine pancreatic insufficiency: Secondary | ICD-10-CM | POA: Diagnosis not present

## 2022-06-01 NOTE — Telephone Encounter (Signed)
Medicare/BCBS federal NPR sent to GI they will call the patient to schedule

## 2022-06-03 NOTE — Progress Notes (Signed)
USN shows a 3.0 cm nodule in the right superior lobe which is mildly suspicious and meets criteria for biopsy.  Claiborne Billings - please arrange USN guided FNA biopsy.  [place], MD [facility] [place] practice Office: [phone]

## 2022-06-04 LAB — PARANEOPLASTIC AB
AGNA-1: NEGATIVE
Amphiphysin Antibody: NEGATIVE
Anti-Hu Ab: NEGATIVE
Anti-Ri Ab: NEGATIVE
Anti-Yo Ab: NEGATIVE
Antineruonal nuclear Ab Type 3: NEGATIVE
CASPR2 Antibody,Cell-based IFA: NEGATIVE
CRMP-5 IgG: NEGATIVE
Interpretation: NEGATIVE
LGI1 Antibody, Cell-based IFA: NEGATIVE
Purkinje Cell Cyto Ab Type 2: NEGATIVE
Purkinje Cell Cyto Ab Type Tr: NEGATIVE
VGCC Antibody: <1 pmol/L (ref 0.0–30.0)

## 2022-06-04 LAB — VITAMIN B1: Thiamine: 173 nmol/L (ref 66.5–200.0)

## 2022-06-04 LAB — VITAMIN B6: Vitamin B6: 24.7 ug/L (ref 3.4–65.2)

## 2022-06-04 LAB — CK: Total CK: 141 U/L (ref 32–182)

## 2022-06-07 ENCOUNTER — Other Ambulatory Visit: Payer: Self-pay | Admitting: Family Medicine

## 2022-06-07 DIAGNOSIS — M85851 Other specified disorders of bone density and structure, right thigh: Secondary | ICD-10-CM

## 2022-06-11 ENCOUNTER — Other Ambulatory Visit: Payer: Self-pay

## 2022-06-11 DIAGNOSIS — E041 Nontoxic single thyroid nodule: Secondary | ICD-10-CM

## 2022-06-12 ENCOUNTER — Other Ambulatory Visit: Payer: Self-pay | Admitting: Surgery

## 2022-06-12 DIAGNOSIS — E041 Nontoxic single thyroid nodule: Secondary | ICD-10-CM

## 2022-06-19 ENCOUNTER — Ambulatory Visit
Admission: RE | Admit: 2022-06-19 | Discharge: 2022-06-19 | Disposition: A | Discharge status: Home / Self Care | Payer: Medicare Other | Source: Ambulatory Visit | Attending: Surgery | Admitting: Surgery

## 2022-06-19 ENCOUNTER — Other Ambulatory Visit (HOSPITAL_COMMUNITY)
Admission: RE | Admit: 2022-06-19 | Discharge: 2022-06-19 | Disposition: A | Discharge status: Home / Self Care | Payer: Medicare Other | Source: Ambulatory Visit | Attending: Surgery | Admitting: Surgery

## 2022-06-19 DIAGNOSIS — E041 Nontoxic single thyroid nodule: Secondary | ICD-10-CM | POA: Diagnosis not present

## 2022-06-19 DIAGNOSIS — E0789 Other specified disorders of thyroid: Secondary | ICD-10-CM | POA: Diagnosis not present

## 2022-06-20 LAB — CYTOLOGY - NON PAP

## 2022-06-26 NOTE — Progress Notes (Signed)
FNA biopsy shows mild atypia.  It is being sent for molecular genetic testing [facility]).  This will take about 2 weeks to get results.

## 2022-06-28 ENCOUNTER — Ambulatory Visit
Admission: RE | Admit: 2022-06-28 | Discharge: 2022-06-28 | Disposition: A | Discharge status: Home / Self Care | Payer: Medicare Other | Source: Ambulatory Visit | Attending: Neurology | Admitting: Neurology

## 2022-06-28 DIAGNOSIS — D3502 Benign neoplasm of left adrenal gland: Secondary | ICD-10-CM | POA: Diagnosis not present

## 2022-06-28 DIAGNOSIS — K7689 Other specified diseases of liver: Secondary | ICD-10-CM | POA: Diagnosis not present

## 2022-06-28 DIAGNOSIS — G1220 Motor neuron disease, unspecified: Secondary | ICD-10-CM

## 2022-06-28 DIAGNOSIS — R634 Abnormal weight loss: Secondary | ICD-10-CM

## 2022-06-28 DIAGNOSIS — R918 Other nonspecific abnormal finding of lung field: Secondary | ICD-10-CM | POA: Diagnosis not present

## 2022-06-28 DIAGNOSIS — R499 Unspecified voice and resonance disorder: Secondary | ICD-10-CM

## 2022-06-28 DIAGNOSIS — I700 Atherosclerosis of aorta: Secondary | ICD-10-CM | POA: Diagnosis not present

## 2022-06-28 DIAGNOSIS — K5730 Diverticulosis of large intestine without perforation or abscess without bleeding: Secondary | ICD-10-CM | POA: Diagnosis not present

## 2022-06-28 DIAGNOSIS — R590 Localized enlarged lymph nodes: Secondary | ICD-10-CM | POA: Diagnosis not present

## 2022-07-03 ENCOUNTER — Encounter (HOSPITAL_COMMUNITY): Payer: Self-pay

## 2022-07-04 ENCOUNTER — Encounter: Payer: Self-pay | Admitting: Speech Pathology

## 2022-07-04 ENCOUNTER — Ambulatory Visit: Payer: Medicare Other | Attending: Family Medicine | Admitting: Speech Pathology

## 2022-07-04 ENCOUNTER — Other Ambulatory Visit: Payer: Self-pay

## 2022-07-04 DIAGNOSIS — R1312 Dysphagia, oropharyngeal phase: Secondary | ICD-10-CM | POA: Diagnosis not present

## 2022-07-04 DIAGNOSIS — R471 Dysarthria and anarthria: Secondary | ICD-10-CM | POA: Diagnosis not present

## 2022-07-04 DIAGNOSIS — R498 Other voice and resonance disorders: Secondary | ICD-10-CM | POA: Diagnosis not present

## 2022-07-04 NOTE — Patient Instructions (Signed)
   We talk on exhalation, so be mindful to take more frequent breaths  You may have to say less words on a breath as this progresses  Practice reading aloud 5-7 words on a breath, then take another breath and read 5-7 more words for 5 minutes twice a day (or whatever works for you)  You are likely running out of air more frequently than before, this results in a more hoarse voice as you are not giving your voice enough breath power  You may not be sensing to swallow your saliva as frequently, try to be mindful of swallowing more frequently. Use a loose rubber band on your wrist to remind yourself  Respiratory muscle training: Expiratory muscle training, Inspiratory muscle training. EMST 150 is what we use here, or we have another device that is lower resistance if needed  Look up RMST for ALS  The same for eating, be mindful that we hold our breath at the moment of the swallow. If you are tired or having a bad day, you may need to be mindful of taking a breath before you swallow   Slowing your rate of speech also helps    Get the persons attention before you speak  Use eye contact and face the person you are speaking to  Be in close proximity to the person you are speaking to  Turn down any noise in the environment such as the TV, walk away from loud appliances, air conditioners, fans, dish washers etc  In large gatherings, sit or stay on the side not the center of the room  Try to sit with a wall behind you or in a corner so noise isn't coming at you from all directions when dining out or attending gatherings  Over the phone, have family get into a quiet room, turn off TV, stop doing chores to focus on your speech so you don't have to work too hard  Let others know off the bat on the business call or a stranger that you have trouble talking and need more time to get your words out  Come up with a spiel that quickly explains your condition and that you are more unsteady and have  trouble talking - add some humor to it if you want. This will be a quick 1-2 sentences you can spit out in any situation. You may need to include that your intelligence is intact

## 2022-07-04 NOTE — Therapy (Addendum)
OUTPATIENT SPEECH LANGUAGE PATHOLOGY APHASIA EVALUATION   Patient Name: Yvonne Jordan MRN: [id number] DOB:10-17-1946, 76 y.o., female Today's Date: 07/06/2022  PCP: Antony Contras, MD Gretchen Short PROVIDER: Antony Contras, MD    End of Session - 07/06/22 0906     Visit Number 1    Number of Visits 25    Date for SLP Re-Evaluation 09/26/22    SLP Start Time 50    SLP Stop Time  1150    SLP Time Calculation (min) 50 min    Activity Tolerance Patient tolerated treatment well             Past Medical History:  Diagnosis Date   Allergic rhinitis    Arthritis    Cancer ([place])    Breast- left   CKD (chronic kidney disease)    stage III   Colon polyp    Dyspnea    GERD (gastroesophageal reflux disease)    History of hiatal hernia    Hyperlipidemia    Personal history of radiation therapy    Stroke [facility])    TIA before 2013   TIA (transient ischemic attack)    Vitamin D deficiency    Past Surgical History:  Procedure Laterality Date   APPENDECTOMY     BREAST LUMPECTOMY Left 2006   BREAST SURGERY     2006...treated with Tamoxifen for 6 years   CHOLECYSTECTOMY     COLONOSCOPY     ESOPHAGOGASTRODUODENOSCOPY N/A 09/01/2020   Procedure: ESOPHAGOGASTRODUODENOSCOPY (EGD);  Surgeon: Lajuana Matte, MD;  Location: [place];  Service: Thoracic;  Laterality: N/A;   XI ROBOTIC ASSISTED PARAESOPHAGEAL HERNIA REPAIR N/A 09/01/2020   Procedure: XI ROBOTIC ASSISTED HIATAL HERNIA REPAIR WITH FUNDOPLICATION AND GASTROPEXY;  Surgeon: Lajuana Matte, MD;  Location: [place];  Service: Thoracic;  Laterality: N/A;   Patient Active Problem List   Diagnosis Date Noted   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   Weakness 05/08/2022   Left foot drop 05/08/2022   Dysarthria 05/08/2022   Gait abnormality 05/08/2022   S/P repair of paraesophageal hernia 09/01/2020   ILD (interstitial lung disease) ([place]) 07/21/2020   Pain in right knee 12/30/2018   Trochanteric bursitis of right  hip 12/30/2018   Low back pain 04/19/2016   Abnormality of gait 03/29/2016   Fall 03/29/2016   Dyspnea 11/10/2014   GERD (gastroesophageal reflux disease) 11/10/2014   TIA (transient ischemic attack)    Vitamin D deficiency    Allergic rhinitis    CKD (chronic kidney disease)    Colon polyp    Hyperlipidemia     ONSET DATE: 06/2022 was last neuro consult   REFERRING DIAG: R47.1 (ICD-10-CM) - Dysarthria and anarthria ,  Pt with upper and lower motor neuron disease, 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 Upper and lower motor neuron disease per neurology consult. Referral for [facility], appointment in Nov 2023.    PAIN:  Are you having pain? No  FALLS: Has patient fallen in last 6 months?  Yes, Number of falls: many, Comment: no falls without brace for drop foot  LIVING  ENVIRONMENT: Lives with: lives with their spouse Lives in: House/apartment  PLOF:  Level of assistance: Independent with ADLs, Independent with IADLs Employment: Retired   PATIENT GOALS "To learn how to speak better"  OBJECTIVE:   DIAGNOSTIC FINDINGS:   MBSS 2016 with c/o voice changes, dysphagia,  SOB at rest at that time   MBSS 2016 results:  "Pt demonstrates a mild oropharyngeal dysphagia with sensory deficits leading to a mild delay in swallow. Pt did have one instance of trace sensed aspiration on initial sip. Otherwise only high flash penetration occurred. Given that pt expels aspirate and deficits are minimal would not recommend any modification in  behavior or diet. Would question why an otherwise healthy 76 yr old has sensory deficits and delayed swallow. Given vocal changes and reports of reflux would consider mucosal changes due to possible episodes of laryngopharyngeal reflux as a cause. No SLP f/u needed at this time."  COGNITION: Overall cognitive status: Within functional limits for tasks assessed   AUDITORY COMPREHENSION: Overall auditory comprehension: Appears intact   EXPRESSION: verbal  VERBAL EXPRESSION: WFL   MOTOR SPEECH: Overall motor speech: impaired Level of impairment: Phrase Respiration: thoracic breathing and speaking on residual capacity Phonation: hoarse Resonance: WFL Articulation: Impaired: phrase Intelligibility: Intelligibility reduced Motor planning: Appears intact Motor speech errors: aware Interfering components:  Effective technique: slow rate, pause, and speak on exhale, more frequent breath support   ORAL MOTOR EXAMINATION Overall status: WFL    PATIENT REPORTED OUTCOME MEASURES (PROM): To be completed next 1-2 sessions   TODAY'S TREATMENT:  Initiated training in use of more frequent breath support for speech to reduce speaking on residual air which results in increased hoarseness. In oral reading task with visual cues to take a breath every 5-6 words, or at natural pauses, Yvonne Jordan required cues not to exhale the breaths,  but to speak on the breath. Education re: reduced pharyngeal sensation as likely cause for choking on saliva with instruction to be mindful to swallow more frequently. She has questioned the use of an incentive spirometer she has at home. We discussed energy conservation and possible RMT. HEP focusing on more frequent breathing in reading tasks provided. Instructed Yvonne Jordan on environmental modifications to maximize intelligibility and reduce speaking effort at home and over the phone. Written instructions for family and friends provided.    PATIENT EDUCATION: Education  details: See today's treatment Person educated: Patient Education method: Explanation, Demonstration, Verbal cues, and Handouts Education comprehension: verbalized understanding, returned demonstration, verbal cues required, and needs further education   GOALS: Goals reviewed with patient? Yes  SHORT TERM GOALS: Target date: 08/01/22  Pt will demonstrate breathing ever 4-7 words to  reduce glottal fry and improve intelligibility as well as conserve energy over 8 minute conversation with rare min A Baseline: Goal status: INITIAL  2.  Pt will follow basic swallow precautions and verbalize awareness of possible silent aspiration d/t h/o of reduced pharyngeal sensation on MBSS 2016.  Baseline:  Goal status: INITIAL  3.  Pt will complete PROM for speech/communication first 1-2 sessions Baseline:  Goal status: INITIAL  4.  Pt will carryover compensatory strategies for dysarthria over 8 minute conversation with rare min A Baseline:  Goal status: INITIAL  5.  Pt will complete RMST at 50% MIP and MEP to improve cough and maintain PO Baseline:  Goal status: INITIAL  6.  Pt will carryover 3 environmental strategies (including family education and behavior modification) to maximize intelligibility while expanding minimal energy Baseline:  Goal  status: INITIAL   7. Pt will demonstrate use of multimodal communication, AAC  over 2 sessions to plan for communication with disease progression Baseline: Goal Status: INITIAL   LONG TERM GOALS: Target date: 09/26/22  Pt will carryover breath support and compensatory strategies for dysarthria to be 100% intelligible over 15 minute conversation at home or in community settings Baseline:  Goal status: INITIAL  2.  Repeat MBSS as indicated and follow swallow precautions/ diet modifications with mod I Baseline:  Goal status: INITIAL  3.  Complete RMST at 60% of MIP/MEP to prolong PO intake and breath support for speech Baseline:  Goal status:  INITIAL  4.  Pt will carryover 2 energy conservation strategies for speech and swallowing with rare min A over 1 week Baseline:  Goal status: INITIAL  5.  Pt will report 50% reduced request for repetitions, subjectively, on phone calls over 1 week Baseline:  Goal status: INITIAL  6.  Pt will improve score on PROM by 2 point Baseline:  Goal status: INITIAL  ASSESSMENT:  CLINICAL IMPRESSION: Patient is a 76 y.o. female who was seen today for concerns re: her speech and voice. Yvonne Jordan reports increased hoarseness which affects her intelligibility especially over the phone. Today she presents with mild dysarthria and mild to moderate dysphonia. Today she is 100% intelligible in this quiet environment face to face. At this time, she denies dysphagia, however MBSS 2016 revealed a mild sensory based dysphagia with voice changes. While Yvonne Jordan denies overt s/s of aspiration with meals and pills, she states "I get choked on nothing at all" during the day. In light of prior h/o of reduced pharyngeal sensation, poor vocal quality and dysarthria, cannot r/o silent aspiration. I suspect she is not sensing to swallow her saliva, resulting in coughing/choking throughout the day. Sustained /a/ averaged 6.5 seconds (20 sec is WNL) and s/z ratio is less than 1.4, which is not indicative of vocal fold pathology. Sustained /a/ , /s/ and /z/ all averaged 6.25 seconds well below WNL due to hoarseness as well as reduced breath support for speech. She is observed to talk on residual air resulting in glottal fry and increased hoarseness, as well as increased effort to speak. Increased effort also expanded when she has to repeat herself in conversations and over the phone.  Pitch range limited on pitch glides. Speech is mildly slurred with imprecise consonants.No evidence of verbal apraxia or word finding difficulty. Yvonne Jordan does endorse her speech and voice worsen throughout the day. In light of upper and lower motor neuron  disease, I recommend skilled ST to maximize intelligibility and safety of swallow, focusing on maintaining current level, energy conservation, training for compensatory strategies and planning for communication options as the disease progresses. Repeat MBSS will be warranted if [place] is in agreement. 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  OBJECTIVE IMPAIRMENTS include memory, dysarthria, voice disorder, and dysphagia. These impairments are limiting patient from effectively communicating at home and in community and safety when swallowing. Factors affecting potential to achieve goals and functional outcome are medical prognosis. Patient will benefit from skilled SLP services to address above impairments and improve overall function.  REHAB POTENTIAL: Good  PLAN: SLP FREQUENCY: 2x/week  SLP DURATION: 12 weeks  PLANNED INTERVENTIONS: Aspiration precaution training, Pharyngeal strengthening exercises, Diet toleration management , Environmental controls, Trials of upgraded texture/liquids, Cueing hierachy, Cognitive reorganization, Internal/external aids, Functional tasks, Multimodal communication approach, SLP instruction and feedback, Compensatory strategies, Patient/family education, Re-evaluation, and objective  swallow study (FEES/MBSS)    Yvonne Jordan, Annye Rusk, CCC-SLP 07/06/2022, 9:07 AM

## 2022-07-05 DIAGNOSIS — E042 Nontoxic multinodular goiter: Secondary | ICD-10-CM | POA: Diagnosis not present

## 2022-07-09 ENCOUNTER — Ambulatory Visit: Payer: Medicare Other | Admitting: Speech Pathology

## 2022-07-09 ENCOUNTER — Encounter: Payer: Self-pay | Admitting: Speech Pathology

## 2022-07-09 DIAGNOSIS — R498 Other voice and resonance disorders: Secondary | ICD-10-CM

## 2022-07-09 DIAGNOSIS — R471 Dysarthria and anarthria: Secondary | ICD-10-CM

## 2022-07-09 DIAGNOSIS — R1312 Dysphagia, oropharyngeal phase: Secondary | ICD-10-CM

## 2022-07-09 NOTE — Therapy (Signed)
OUTPATIENT SPEECH LANGUAGE PATHOLOGY TREATMENT NOTE   Patient Name: Yvonne Jordan MRN: [id number] DOB:[birth year], 76 y.o., female Today's Date: 07/09/2022  PCP: Antony Contras, MD  REFERRING PROVIDER: Antony Contras, MD   END OF SESSION:   End of Session - 07/09/22 1019     Visit Number 2    Number of Visits 25    Date for SLP Re-Evaluation 09/26/22    SLP Start Time 1015    SLP Stop Time  1100    SLP Time Calculation (min) 45 min    Activity Tolerance Patient tolerated treatment well             Past Medical History:  Diagnosis Date   Allergic rhinitis    Arthritis    Cancer ([place])    Breast- left   CKD (chronic kidney disease)    stage III   Colon polyp    Dyspnea    GERD (gastroesophageal reflux disease)    History of hiatal hernia    Hyperlipidemia    Personal history of radiation therapy    Stroke [facility])    TIA before 2013   TIA (transient ischemic attack)    Vitamin D deficiency    Past Surgical History:  Procedure Laterality Date   APPENDECTOMY     BREAST LUMPECTOMY Left 2006   BREAST SURGERY     2006...treated with Tamoxifen for 6 years   CHOLECYSTECTOMY     COLONOSCOPY     ESOPHAGOGASTRODUODENOSCOPY N/A 09/01/2020   Procedure: ESOPHAGOGASTRODUODENOSCOPY (EGD);  Surgeon: Lajuana Matte, MD;  Location: [place];  Service: Thoracic;  Laterality: N/A;   XI ROBOTIC ASSISTED PARAESOPHAGEAL HERNIA REPAIR N/A 09/01/2020   Procedure: XI ROBOTIC ASSISTED HIATAL HERNIA REPAIR WITH FUNDOPLICATION AND GASTROPEXY;  Surgeon: Lajuana Matte, MD;  Location: [place];  Service: Thoracic;  Laterality: N/A;   Patient Active Problem List   Diagnosis Date Noted   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   Weakness 05/08/2022   Left foot drop 05/08/2022   Dysarthria 05/08/2022   Gait abnormality 05/08/2022   S/P repair of paraesophageal hernia 09/01/2020   ILD (interstitial lung disease) (Yvonne Jordan) 07/21/2020   Pain in right knee 12/30/2018   Trochanteric bursitis  of right hip 12/30/2018   Low back pain 04/19/2016   Abnormality of gait 03/29/2016   Fall 03/29/2016   Dyspnea 11/10/2014   GERD (gastroesophageal reflux disease) 11/10/2014   TIA (transient ischemic attack)    Vitamin D deficiency    Allergic rhinitis    CKD (chronic kidney disease)    Colon polyp    Hyperlipidemia     ONSET DATE: 2023 dx with upper and lower motor neuron disease  REFERRING DIAG: R47.1 (ICD-10-CM) - Dysarthria and anarthria   THERAPY DIAG:  Dysarthria and anarthria  Dysphagia, oropharyngeal phase - Plan: SLP modified barium swallow  Other voice and resonance disorders  Rationale for Evaluation and Treatment Rehabilitation  SUBJECTIVE: "I have lost 60 pounds" PAIN:  Are you having pain? No   OBJECTIVE: wrote order to repeat MBSS due to dysphagia and 60lb weight loss  TODAY'S TREATMENT:  07/09/22: Trained [place] in 3 environmental modifications to improve ease of communication and reduce need for repetition to conserve her voice, speech  and energy. She notes voice worsens as day progresses. Instructed her on resting her speech and voice if she has important evening events. She has not educated her family on strategies they can use to make communication easier in person and over the phone. I  continue to encourage her to do this. She reports her spouse often wants to talk to her from across the house. It is imperative that Yvonne Jordan instruct him to be as close as possible to reduce her effort required to be understood. See pt instructions. General energy conservation strategies including setting priorities and letting less important tasks be handled by someone else as she is able. 2 episodes of coughing on her saliva today, she is agreeable to a repeat MBSS due to h/o dysphagia and significant weight unintentional weight loss. Measured MIP: 31 cm H20 and MEP: 37 cm H20. Initiated training on IMST at 30% of MIP, 9.5. She completed this with effort rated at about  3-4/10 and occasional min A. She will start with 5 sets of 5 reps twice a day, paying attention to her energy level. Will initiate EMST next week at 30% MEP (11).   07/04/22: Initiated training in use of more frequent breath support for speech to reduce speaking on residual air which results in increased hoarseness. In oral reading task with visual cues to take a breath every 5-6 words, or at natural pauses, Yvonne Jordan required cues not to exhale the breaths,  but to speak on the breath. Education re: reduced pharyngeal sensation as likely cause for choking on saliva with instruction to be mindful to swallow more frequently. She has questioned the use of an incentive spirometer she has at home. We discussed energy conservation and possible RMT. HEP focusing on more frequent breathing in reading tasks provided. Instructed Cohen on environmental modifications to maximize intelligibility and reduce speaking effort at home and over the phone. Written instructions for family and friends provided.      PATIENT EDUCATION: Education details: See today's treatment Person educated: Patient Education method: Explanation, Demonstration, Verbal cues, and Handouts Education comprehension: verbalized understanding, returned demonstration, verbal cues required, and needs further education     GOALS: Goals reviewed with patient? Yes   SHORT TERM GOALS: Target date: 08/01/22   Pt will demonstrate breathing ever 4-7 words to  reduce glottal fry and improve intelligibility as well as conserve energy over 8 minute conversation with rare min A Baseline: Goal status: ONGOING   2.  Pt will follow basic swallow precautions and verbalize awareness of possible silent aspiration d/t h/o of reduced pharyngeal sensation on MBSS 2016.  Baseline:  Goal status: ONGOING   3.  Pt will complete PROM for speech/communication first 1-2 sessions Baseline:  Goal status: ONGOING   4.  Pt will carryover compensatory strategies for  dysarthria over 8 minute conversation with rare min A Baseline:  Goal status: ONGOING   5.  Pt will complete RMST at 50% MIP and MEP to improve cough and maintain PO Baseline:  Goal status: ONGOING   6.  Pt will carryover 3 environmental strategies (including family education and behavior modification) to maximize intelligibility while expanding minimal energy Baseline:  Goal status: ONGOING     7. Pt will demonstrate use of multimodal communication, AAC  over 2 sessions to plan for communication with disease progression Baseline: Goal Status:ONGOING     LONG TERM GOALS: Target date: 09/26/22   Pt will carryover breath support and compensatory strategies for dysarthria to be 100% intelligible over 15 minute conversation at home or in community settings Baseline:  Goal status: ONGOING   2.  Repeat MBSS as indicated and follow swallow precautions/ diet modifications with mod I Baseline:  Goal status: ONGOING   3.  Complete RMST at 60% of MIP/MEP  to prolong PO intake and breath support for speech Baseline:  Goal status:ONGOING   4.  Pt will carryover 2 energy conservation strategies for speech and swallowing with rare min A over 1 week Baseline:  Goal status: ONGOING   5.  Pt will report 50% reduced request for repetitions, subjectively, on phone calls over 1 week Baseline:  Goal status: ONGOING   6.  Pt will improve score on PROM by 2 point Baseline:  Goal status: ONGOING   ASSESSMENT:   CLINICAL IMPRESSION: Patient is a 76 y.o. female who was seen today for concerns re: her speech and voice. Synai reports increased hoarseness which affects her intelligibility especially over the phone. Today she presents with mild dysarthria and mild to moderate dysphonia. Today she is 100% intelligible in this quiet environment face to face.  Syvilla does endorse her speech and voice worsen throughout the day. In light of upper and lower motor neuron disease, I recommend skilled ST to  maximize intelligibility and safety of swallow, focusing on maintaining current level, energy conservation, training for compensatory strategies and planning for communication options as the disease progresses. Repeat MBSS will be warranted if Kendria is in agreement. 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   OBJECTIVE IMPAIRMENTS include memory, dysarthria, voice disorder, and dysphagia. These impairments are limiting patient from effectively communicating at home and in community and safety when swallowing. Factors affecting potential to achieve goals and functional outcome are medical prognosis. Patient will benefit from skilled SLP services to address above impairments and improve overall function.   REHAB POTENTIAL: Good   PLAN: SLP FREQUENCY: 2x/week   SLP DURATION: 12 weeks   PLANNED INTERVENTIONS: Aspiration precaution training, Pharyngeal strengthening exercises, Diet toleration management , Environmental controls, Trials of upgraded texture/liquids, Cueing hierachy, Cognitive reorganization, Internal/external aids, Functional tasks, Multimodal communication approach, SLP instruction and feedback, Compensatory strategies, Patient/family education, Re-evaluation, and objective swallow study (FEES/MBSS)         [place], Annye Rusk, [place] 07/09/2022, 12:25 PM

## 2022-07-09 NOTE — Patient Instructions (Addendum)
Energy conservation is KEY! Think of your top priorities for the day or week. Manage your energy so you can give the most to your priorities Have an event in the evening? Rest your voice during the day to preserve voice quality and speech Consider socializing in smaller groups in more quiet places - you may call ahead and explain the situation and see if you can get in a quiet spot. [facility] has a private room you can have the music turned off. This may be helpful to have spouse or family help you navigate this  Limit wasting energy on tasks that don't bring you joy or cause stress (this includes people)  If you have a bigger event (wedding, reunion) consider taking breaks in a quiet space if needed  The goal is to expand a little energy as possible when communicating and eating Remember, going to appointments (including speech or PT) uses your energy - plan around this  You have been issued a Respiratory Muscle Strength Trainer(s) to increase lung strength for improved speech and/or swallowing abilities. Please follow the instructions below provided by your Speech-Language Pathologist to complete your exercises.  Educate your family on how they can help you not have to repeat yourself - face to face, no distractions, limit   Expiratory Muscle Training  Blue Device - "Blue Equals Blow"  Perform  5 repetition, 5x twice a day Device set at 11 cm H20  How to perform:  Use the nose clip Take a big breath, then blow hard into the trainer (device) Rest for at least 2 seconds between repetitions  What you should feel/hear: Burst of air through the device  Inspiratory Muscle Training Clear Device - "Suck in"  Use the nose clup Perform 5 repetitions, 5 times  twice per day (50 a day) Device set at 9cm H2O  How to perform:       Melinda Crutch out all of your air Place trainer in your mouth and then suck into the trainer (device) Rest for at least 2 seconds between repetitions  What you should  feel/hear: Burst of air through the device  Take a break or discontinue use if you experience lightheadedness, dizziness, pain, shortness of breath, color change, or excessive sweating. Any questions, call [phone]. Please bring your device(s) to therapy sessions.

## 2022-07-10 ENCOUNTER — Other Ambulatory Visit (HOSPITAL_COMMUNITY): Payer: Self-pay

## 2022-07-10 DIAGNOSIS — R059 Cough, unspecified: Secondary | ICD-10-CM

## 2022-07-10 DIAGNOSIS — R1310 Dysphagia, unspecified: Secondary | ICD-10-CM

## 2022-07-11 ENCOUNTER — Ambulatory Visit: Payer: Medicare Other | Admitting: Speech Pathology

## 2022-07-11 ENCOUNTER — Encounter: Payer: Self-pay | Admitting: Speech Pathology

## 2022-07-11 DIAGNOSIS — R471 Dysarthria and anarthria: Secondary | ICD-10-CM

## 2022-07-11 DIAGNOSIS — R1312 Dysphagia, oropharyngeal phase: Secondary | ICD-10-CM | POA: Diagnosis not present

## 2022-07-11 DIAGNOSIS — R498 Other voice and resonance disorders: Secondary | ICD-10-CM | POA: Diagnosis not present

## 2022-07-11 NOTE — Therapy (Signed)
OUTPATIENT SPEECH LANGUAGE PATHOLOGY TREATMENT NOTE   Patient Name: Yvonne Jordan MRN: [id number] DOB:1946/05/23, 76 y.o., female Today's Date: 07/11/2022  PCP: Yvonne Contras, MD  REFERRING PROVIDER: Antony Contras, MD   END OF SESSION:   End of Session - 07/11/22 1324     Visit Number 3    Number of Visits 25    Date for SLP Re-Evaluation 09/26/22    SLP Start Time 87    SLP Stop Time  1400    SLP Time Calculation (min) 42 min    Activity Tolerance Patient tolerated treatment well             Past Medical History:  Diagnosis Date   Allergic rhinitis    Arthritis    Cancer ([place])    Breast- left   CKD (chronic kidney disease)    stage III   Colon polyp    Dyspnea    GERD (gastroesophageal reflux disease)    History of hiatal hernia    Hyperlipidemia    Personal history of radiation therapy    Stroke [facility])    TIA before 2013   TIA (transient ischemic attack)    Vitamin D deficiency    Past Surgical History:  Procedure Laterality Date   APPENDECTOMY     BREAST LUMPECTOMY Left 2006   BREAST SURGERY     2006...treated with Tamoxifen for 6 years   CHOLECYSTECTOMY     COLONOSCOPY     ESOPHAGOGASTRODUODENOSCOPY N/A 09/01/2020   Procedure: ESOPHAGOGASTRODUODENOSCOPY (EGD);  Surgeon: Lajuana Matte, MD;  Location: [place];  Service: Thoracic;  Laterality: N/A;   XI ROBOTIC ASSISTED PARAESOPHAGEAL HERNIA REPAIR N/A 09/01/2020   Procedure: XI ROBOTIC ASSISTED HIATAL HERNIA REPAIR WITH FUNDOPLICATION AND GASTROPEXY;  Surgeon: Lajuana Matte, MD;  Location: [place];  Service: Thoracic;  Laterality: N/A;   Patient Active Problem List   Diagnosis Date Noted   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   Weakness 05/08/2022   Left foot drop 05/08/2022   Dysarthria 05/08/2022   Gait abnormality 05/08/2022   S/P repair of paraesophageal hernia 09/01/2020   ILD (interstitial lung disease) ([place]) 07/21/2020   Pain in right knee 12/30/2018   Trochanteric bursitis  of right hip 12/30/2018   Low back pain 04/19/2016   Abnormality of gait 03/29/2016   Fall 03/29/2016   Dyspnea 11/10/2014   GERD (gastroesophageal reflux disease) 11/10/2014   TIA (transient ischemic attack)    Vitamin D deficiency    Allergic rhinitis    CKD (chronic kidney disease)    Colon polyp    Hyperlipidemia     ONSET DATE: 2023 dx with upper and lower motor neuron disease  REFERRING DIAG: R47.1 (ICD-10-CM) - Dysarthria and anarthria   THERAPY DIAG:  Dysarthria and anarthria  Dysphagia, oropharyngeal phase  Other voice and resonance disorders  Rationale for Evaluation and Treatment Rehabilitation  SUBJECTIVE: "I have an appointment on Tuesday" re: MBSS PAIN:  Are you having pain? No   OBJECTIVE:   TODAY'S TREATMENT:  07/11/22: MBSS scheduled for Tuesday. Yvonne Jordan reports she has educated her spouse on strategies he can use to support her ease of communication and reduce need for repetition. Targeted breath support for speech, speaking few words on exhale and breathing more frequently to reduce effort of speech and speaking on residual air. At this time, Yvonne Jordan is avoiding phone calls due to reduced intelligibility. She has not called [facility] to see if she is still on the waitlist for a sooner appointment.  We generated a script of what she wanted to say, including her name, DOB and questions. She demonstrated slow rate and over articulation and pausing to improve intelligibility over the phone in role play. Also instructed her to have her husband call if that is easier. She is also avoiding phone calls from extended family. We generated a strategy of letting the other person know "the doctor said I can only talk for 15 minutes" as a strategy to conserve speech/voice and energy. Trained in strategies to maximize nutrition and minimize effort of eating/swallowing. Provided handout re: maximizing nutrition in ALS. Weight stable last week, and down 1.5 lbs this week.      07/09/22: Trained Yvonne Jordan in 3 environmental modifications to improve ease of communication and reduce need for repetition to conserve her voice, speech  and energy. She notes voice worsens as day progresses. Instructed her on resting her speech and voice if she has important evening events. She has not educated her family on strategies they can use to make communication easier in person and over the phone. I continue to encourage her to do this. She reports her spouse often wants to talk to her from across the house. It is imperative that Yvonne Jordan instruct him to be as close as possible to reduce her effort required to be understood. See pt instructions. General energy conservation strategies including setting priorities and letting less important tasks be handled by someone else as she is able. 2 episodes of coughing on her saliva today, she is agreeable to a repeat MBSS due to h/o dysphagia and significant weight unintentional weight loss. Measured MIP: 31 cm H20 and MEP: 37 cm H20. Initiated training on IMST at 30% of MIP, 9.5. She completed this with effort rated at about 3-4/10 and occasional min A. She will start with 5 sets of 5 reps twice a day, paying attention to her energy level. Will initiate EMST next week at 30% MEP (11).   07/04/22: Initiated training in use of more frequent breath support for speech to reduce speaking on residual air which results in increased hoarseness. In oral reading task with visual cues to take a breath every 5-6 words, or at natural pauses, Yvonne Jordan required cues not to exhale the breaths,  but to speak on the breath. Education re: reduced pharyngeal sensation as likely cause for choking on saliva with instruction to be mindful to swallow more frequently. She has questioned the use of an incentive spirometer she has at home. We discussed energy conservation and possible RMT. HEP focusing on more frequent breathing in reading tasks provided. Instructed Yvonne Jordan on  environmental modifications to maximize intelligibility and reduce speaking effort at home and over the phone. Written instructions for family and friends provided.      PATIENT EDUCATION: Education details: See today's treatment Person educated: Patient Education method: Explanation, Demonstration, Verbal cues, and Handouts Education comprehension: verbalized understanding, returned demonstration, verbal cues required, and needs further education     GOALS: Goals reviewed with patient? Yes   SHORT TERM GOALS: Target date: 08/01/22   Pt will demonstrate breathing ever 4-7 words to  reduce glottal fry and improve intelligibility as well as conserve energy over 8 minute conversation with rare min A Baseline: Goal status: ONGOING   2.  Pt will follow basic swallow precautions and verbalize awareness of possible silent aspiration d/t h/o of reduced pharyngeal sensation on MBSS 2016.  Baseline:  Goal status: ONGOING   3.  Pt will complete PROM for speech/communication first  1-2 sessions Baseline:  Goal status: ONGOING   4.  Pt will carryover compensatory strategies for dysarthria over 8 minute conversation with rare min A Baseline:  Goal status: ONGOING   5.  Pt will complete RMST at 50% MIP and MEP to improve cough and maintain PO Baseline:  Goal status: ONGOING   6.  Pt will carryover 3 environmental strategies (including family education and behavior modification) to maximize intelligibility while expanding minimal energy Baseline:  Goal status: ONGOING     7. Pt will demonstrate use of multimodal communication, AAC  over 2 sessions to plan for communication with disease progression Baseline: Goal Status:ONGOING     LONG TERM GOALS: Target date: 09/26/22   Pt will carryover breath support and compensatory strategies for dysarthria to be 100% intelligible over 15 minute conversation at home or in community settings Baseline:  Goal status: ONGOING   2.  Repeat MBSS as  indicated and follow swallow precautions/ diet modifications with mod I Baseline:  Goal status: ONGOING   3.  Complete RMST at 60% of MIP/MEP to prolong PO intake and breath support for speech Baseline:  Goal status:ONGOING   4.  Pt will carryover 2 energy conservation strategies for speech and swallowing with rare min A over 1 week Baseline:  Goal status: ONGOING   5.  Pt will report 50% reduced request for repetitions, subjectively, on phone calls over 1 week Baseline:  Goal status: ONGOING   6.  Pt will improve score on PROM by 2 point Baseline:  Goal status: ONGOING   ASSESSMENT:   CLINICAL IMPRESSION: Patient is a 76 y.o. female who was seen today for concerns re: her speech and voice. Johana reports increased hoarseness which affects her intelligibility especially over the phone. Today she presents with mild dysarthria and mild to moderate dysphonia. Today she is 100% intelligible in this quiet environment face to face.  [place] does endorse her speech and voice worsen throughout the day. In light of upper and lower motor neuron disease, I recommend skilled ST to maximize intelligibility and safety of swallow, focusing on maintaining current level, energy conservation, training for compensatory strategies and planning for communication options as the disease progresses. Repeat MBSS will be warranted if Yanel[place] is in agreement. 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   OBJECTIVE IMPAIRMENTS include memory, dysarthria, voice disorder, and dysphagia. These impairments are limiting patient from effectively communicating at home and in community and safety when swallowing. Factors affecting potential to achieve goals and functional outcome are medical prognosis. Patient will benefit from skilled SLP services to address above impairments and improve overall function.   REHAB POTENTIAL: Good   PLAN: SLP FREQUENCY: 2x/week   SLP DURATION: 12  weeks   PLANNED INTERVENTIONS: Aspiration precaution training, Pharyngeal strengthening exercises, Diet toleration management , Environmental controls, Trials of upgraded texture/liquids, Cueing hierachy, Cognitive reorganization, Internal/external aids, Functional tasks, Multimodal communication approach, SLP instruction and feedback, Compensatory strategies, Patient/family education, Re-evaluation, and objective swallow study (FEES/MBSS)         Mick [place], Annye Rusk, [place] 07/11/2022, 1:25 PM

## 2022-07-11 NOTE — Therapy (Deleted)
OUTPATIENT SPEECH LANGUAGE PATHOLOGY TREATMENT   Patient Name: Yvonne Jordan MRN: [id number] DOB:1946/06/08, 76 y.o., female Today's Date: 07/11/2022  PCP: Antony Contras, MD Yvonne Jordan REFERRING PROVIDER: Antony Contras, MD      Past Medical History:  Diagnosis Date   Allergic rhinitis    Arthritis    Cancer ([place])    Breast- left   CKD (chronic kidney disease)    stage III   Colon polyp    Dyspnea    GERD (gastroesophageal reflux disease)    History of hiatal hernia    Hyperlipidemia    Personal history of radiation therapy    Stroke [facility])    TIA before 2013   TIA (transient ischemic attack)    Vitamin D deficiency    Past Surgical History:  Procedure Laterality Date   APPENDECTOMY     BREAST LUMPECTOMY Left 2006   BREAST SURGERY     2006...treated with Tamoxifen for 6 years   CHOLECYSTECTOMY     COLONOSCOPY     ESOPHAGOGASTRODUODENOSCOPY N/A 09/01/2020   Procedure: ESOPHAGOGASTRODUODENOSCOPY (EGD);  Surgeon: Lajuana Matte, MD;  Location: [place];  Service: Thoracic;  Laterality: N/A;   XI ROBOTIC ASSISTED PARAESOPHAGEAL HERNIA REPAIR N/A 09/01/2020   Procedure: XI ROBOTIC ASSISTED HIATAL HERNIA REPAIR WITH FUNDOPLICATION AND GASTROPEXY;  Surgeon: Lajuana Matte, MD;  Location: [place];  Service: Thoracic;  Laterality: N/A;   Patient Active Problem List   Diagnosis Date Noted   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   Weakness 05/08/2022   Left foot drop 05/08/2022   Dysarthria 05/08/2022   Gait abnormality 05/08/2022   S/P repair of paraesophageal hernia 09/01/2020   ILD (interstitial lung disease) ([place]) 07/21/2020   Pain in right knee 12/30/2018   Trochanteric bursitis of right hip 12/30/2018   Low back pain 04/19/2016   Abnormality of gait 03/29/2016   Fall 03/29/2016   Dyspnea 11/10/2014   GERD (gastroesophageal reflux disease) 11/10/2014   TIA (transient ischemic attack)    Vitamin D deficiency    Allergic rhinitis    CKD (chronic kidney  disease)    Colon polyp    Hyperlipidemia     ONSET DATE: 06/2022 was last neuro consult   REFERRING DIAG: R47.1 (ICD-10-CM) - Dysarthria and anarthria ,  Pt with upper and lower motor neuron disease, 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  Rationale for Evaluation and Treatment Rehabilitation  SUBJECTIVE:   SUBJECTIVE STATEMENT: *** Pt accompanied by: self  PAIN:  Are you having pain? No  OBJECTIVE:    PATIENT REPORTED OUTCOME MEASURES (PROM): To be completed next 1-2 sessions   TODAY'S TREATMENT:  07-11-22: ***  *** Initiated training in use of more frequent breath support for speech to reduce speaking on residual air which results in increased hoarseness. In oral reading task with visual cues to take a breath every 5-6 words, or at natural pauses, Yvonne Jordan required cues not to exhale the breaths,  but to speak on the breath. Education re: reduced pharyngeal sensation as likely cause for choking on saliva with instruction to be mindful to swallow more frequently. She has questioned the use of an incentive spirometer she has at home. We discussed energy conservation and possible RMT. HEP focusing on more frequent breathing in reading tasks provided. Instructed Yvonne Jordan on environmental modifications to maximize intelligibility and reduce speaking effort at home and over the phone. Written instructions for family and friends provided.    PATIENT EDUCATION: Education details: See today's treatment  Person educated: Patient Education method: Explanation, Demonstration, Verbal cues, and Handouts Education comprehension: verbalized understanding, returned demonstration, verbal cues required, and needs further education   GOALS: Goals reviewed with patient? Yes  SHORT TERM GOALS: Target date: 08/01/22  Pt will demonstrate breathing ever 4-7 words to  reduce glottal fry and improve intelligibility as well as conserve energy over 8 minute conversation with rare min  A Baseline: Goal status: IN PROGRESS  2.  Pt will follow basic swallow precautions and verbalize awareness of possible silent aspiration d/t h/o of reduced pharyngeal sensation on MBSS 2016.  Baseline:  Goal status: IN PROGRESS  3.  Pt will complete PROM for speech/communication first 1-2 sessions Baseline: *** Goal status: IN PROGRESS  4.  Pt will carryover compensatory strategies for dysarthria over 8 minute conversation with rare min A Baseline:  Goal status: IN PROGRESS  5.  Pt will complete RMST at 50% MIP and MEP to improve cough and maintain PO Baseline:  Goal status: IN PROGRESS  6.  Pt will carryover 3 environmental strategies (including family education and behavior modification) to maximize intelligibility while expanding minimal energy Baseline:  Goal status: IN PROGRESS   7. Pt will demonstrate use of multimodal communication, AAC  over 2 sessions to plan for communication with disease progression Baseline: Goal Status: INITIAL   LONG TERM GOALS: Target date: 09/26/22  Pt will carryover breath support and compensatory strategies for dysarthria to be 100% intelligible over 15 minute conversation at home or in community settings Baseline:  Goal status: IN PROGRESS  2.  Repeat MBSS as indicated and follow swallow precautions/ diet modifications with mod I Baseline:  Goal status: IN PROGRESS  3.  Complete RMST at 60% of MIP/MEP to prolong PO intake and breath support for speech Baseline:  Goal status: IN PROGRESS  4.  Pt will carryover 2 energy conservation strategies for speech and swallowing with rare min A over 1 week Baseline:  Goal status: IN PROGRESS  5.  Pt will report 50% reduced request for repetitions, subjectively, on phone calls over 1 week Baseline:  Goal status: IN PROGRESS  6.  Pt will improve score on PROM by 2 point Baseline:  Goal status: IN PROGRESS  ASSESSMENT:  CLINICAL IMPRESSION: Patient is a 76 y.o. female who was seen today  for concerns re: her speech and voice. Yvonne Jordan reports increased hoarseness which affects her intelligibility especially over the phone. Today she presents with mild dysarthria and mild to moderate dysphonia. Today she is 100% intelligible in this quiet environment face to face. At this time, she denies dysphagia, however MBSS 2016 revealed a mild sensory based dysphagia with voice changes. While Larita denies overt s/s of aspiration with meals and pills, she states "I get choked on nothing at all" during the day. In light of prior h/o of reduced pharyngeal sensation, poor vocal quality and dysarthria, cannot r/o silent aspiration. I suspect she is not sensing to swallow her saliva, resulting in coughing/choking throughout the day. Sustained /a/ averaged 6.5 seconds (20 sec is WNL) and s/z ratio is less than 1.4, which is not indicative of vocal fold pathology. Sustained /a/ , /s/ and /z/ all averaged 6.25 seconds well below WNL due to hoarseness as well as reduced breath support for speech. She is observed to talk on residual air resulting in glottal fry and increased hoarseness, as well as increased effort to speak. Increased effort also expanded when she has to repeat herself in conversations and over the phone.  Pitch range limited on pitch glides. Speech is mildly slurred with imprecise consonants.No evidence of verbal apraxia or word finding difficulty. Darrian does endorse her speech and voice worsen throughout the day. In light of upper and lower motor neuron disease, I recommend skilled ST to maximize intelligibility and safety of swallow, focusing on maintaining current level, energy conservation, training for compensatory strategies and planning for communication options as the disease progresses. Repeat MBSS will be warranted if Jermika is in agreement. 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  OBJECTIVE IMPAIRMENTS include memory, dysarthria, voice disorder, and  dysphagia. These impairments are limiting patient from effectively communicating at home and in community and safety when swallowing. Factors affecting potential to achieve goals and functional outcome are medical prognosis. Patient will benefit from skilled SLP services to address above impairments and improve overall function.  REHAB POTENTIAL: Good  PLAN: SLP FREQUENCY: 2x/week  SLP DURATION: 12 weeks  PLANNED INTERVENTIONS: Aspiration precaution training, Pharyngeal strengthening exercises, Diet toleration management , Environmental controls, Trials of upgraded texture/liquids, Cueing hierachy, Cognitive reorganization, Internal/external aids, Functional tasks, Multimodal communication approach, SLP instruction and feedback, Compensatory strategies, Patient/family education, Re-evaluation, and objective swallow study (FEES/MBSS)    Su Monks, [place] 07/11/2022, 9:01 AM

## 2022-07-11 NOTE — Patient Instructions (Signed)
Practice you name and b date with slow, over enunciated speech  "I'm having trouble talking. I need extra time. Please be patient" I'm checking on my status on the wait list. Do you have any openings before November?  When you make a business call or call to a stranger, let them know that you are having trouble talking right away   "The doctor said I can only talk for 15-20 minutes" - let the other person know right off the bat  The same thing with visitors - "the doctor said I can only visit for 15 minutes"  [employer] have adaptive clothing and shoes - look online  With swallowing, it is the same energy conservation - if you are having a dinner out with friends, you may benefit from eating softer, easier to chew swallow foods during the day to conserve your swallowing energy for the evening

## 2022-07-16 ENCOUNTER — Ambulatory Visit: Payer: Medicare Other | Admitting: Speech Pathology

## 2022-07-16 ENCOUNTER — Encounter: Payer: Self-pay | Admitting: Speech Pathology

## 2022-07-16 DIAGNOSIS — R1312 Dysphagia, oropharyngeal phase: Secondary | ICD-10-CM | POA: Diagnosis not present

## 2022-07-16 DIAGNOSIS — R471 Dysarthria and anarthria: Secondary | ICD-10-CM | POA: Diagnosis not present

## 2022-07-16 DIAGNOSIS — R498 Other voice and resonance disorders: Secondary | ICD-10-CM

## 2022-07-16 NOTE — Therapy (Signed)
OUTPATIENT SPEECH LANGUAGE PATHOLOGY TREATMENT NOTE   Patient Name: Yvonne Jordan MRN: [id number] DOB:1946/09/11, 76 y.o., female Today's Date: 07/16/2022  PCP: Antony Contras, MD  REFERRING PROVIDER: Antony Contras, MD   END OF SESSION:   End of Session - 07/16/22 1329     Visit Number 4    Number of Visits 25    Date for SLP Re-Evaluation 09/26/22    SLP Start Time 1315    SLP Stop Time  1400    SLP Time Calculation (min) 45 min    Activity Tolerance Patient tolerated treatment well             Past Medical History:  Diagnosis Date   Allergic rhinitis    Arthritis    Cancer ([place])    Breast- left   CKD (chronic kidney disease)    stage III   Colon polyp    Dyspnea    GERD (gastroesophageal reflux disease)    History of hiatal hernia    Hyperlipidemia    Personal history of radiation therapy    Stroke [facility])    TIA before 2013   TIA (transient ischemic attack)    Vitamin D deficiency    Past Surgical History:  Procedure Laterality Date   APPENDECTOMY     BREAST LUMPECTOMY Left 2006   BREAST SURGERY     2006...treated with Tamoxifen for 6 years   CHOLECYSTECTOMY     COLONOSCOPY     ESOPHAGOGASTRODUODENOSCOPY N/A 09/01/2020   Procedure: ESOPHAGOGASTRODUODENOSCOPY (EGD);  Surgeon: Lajuana Matte, MD;  Location: [place];  Service: Thoracic;  Laterality: N/A;   XI ROBOTIC ASSISTED PARAESOPHAGEAL HERNIA REPAIR N/A 09/01/2020   Procedure: XI ROBOTIC ASSISTED HIATAL HERNIA REPAIR WITH FUNDOPLICATION AND GASTROPEXY;  Surgeon: Lajuana Matte, MD;  Location: [place];  Service: Thoracic;  Laterality: N/A;   Patient Active Problem List   Diagnosis Date Noted   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   Weakness 05/08/2022   Left foot drop 05/08/2022   Dysarthria 05/08/2022   Gait abnormality 05/08/2022   S/P repair of paraesophageal hernia 09/01/2020   ILD (interstitial lung disease) ([place]) 07/21/2020   Pain in right knee 12/30/2018   Trochanteric bursitis  of right hip 12/30/2018   Low back pain 04/19/2016   Abnormality of gait 03/29/2016   Fall 03/29/2016   Dyspnea 11/10/2014   GERD (gastroesophageal reflux disease) 11/10/2014   TIA (transient ischemic attack)    Vitamin D deficiency    Allergic rhinitis    CKD (chronic kidney disease)    Colon polyp    Hyperlipidemia     ONSET DATE: 2023 dx with upper and lower motor neuron disease  REFERRING DIAG: R47.1 (ICD-10-CM) - Dysarthria and anarthria   THERAPY DIAG:  Dysarthria and anarthria  Dysphagia, oropharyngeal phase  Other voice and resonance disorders  Rationale for Evaluation and Treatment Rehabilitation  SUBJECTIVE: "I have an appointment on Tuesday" re: MBSS PAIN:  Are you having pain? No   OBJECTIVE:   TODAY'S TREATMENT:  07/16/22: Initiated EMST on the [employer] Threshold PEP at 8-9 cm H20, less than 30% MEP with rare min A. She completed 5 sets of 5 reps with mod I and denies fatigue IMT not changed, completed 5 sets of 5 reps with mod I. Yvonne Jordan reports she is tolerating RMST without reduced energy or fatigue. Educated how to tell the difference in EMT trainer vs IMT trainer. Yvonne Jordan required verbal cues 3x to ID when she is speaking on residual  air with increased vocal fry, reduced volume and increased effort to speak. Mod A to take more frequent breaths and to be aware of increased hoarseness and effort speaking as cue that she needs to breathe before continuing conversing.Initiated education of options for AAC. As her spouse is in a wheelchair and it takes effort to get groceries, encouraged her to order online groceries. MBSS tomorrow  07/11/22: MBSS scheduled for Tuesday. Yvonne Jordan reports she has educated her spouse on strategies he can use to support her ease of communication and reduce need for repetition. Targeted breath support for speech, speaking few words on exhale and breathing more frequently to reduce effort of speech and speaking on residual air. At  this time, Yvonne Jordan is avoiding phone calls due to reduced intelligibility. She has not called [facility] to see if she is still on the waitlist for a sooner appointment. We generated a script of what she wanted to say, including her name, DOB and questions. She demonstrated slow rate and over articulation and pausing to improve intelligibility over the phone in role play. Also instructed her to have her husband call if that is easier. She is also avoiding phone calls from extended family. We generated a strategy of letting the other person know "the doctor said I can only talk for 15 minutes" as a strategy to conserve speech/voice and energy. Trained in strategies to maximize nutrition and minimize effort of eating/swallowing. Provided handout re: maximizing nutrition in ALS. Weight stable last week, and down 1.5 lbs this week.     07/09/22: Trained Yvonne Jordan in 3 environmental modifications to improve ease of communication and reduce need for repetition to conserve her voice, speech  and energy. She notes voice worsens as day progresses. Instructed her on resting her speech and voice if she has important evening events. She has not educated her family on strategies they can use to make communication easier in person and over the phone. I continue to encourage her to do this. She reports her spouse often wants to talk to her from across the house. It is imperative that Yvonne Jordan instruct him to be as close as possible to reduce her effort required to be understood. See pt instructions. General energy conservation strategies including setting priorities and letting less important tasks be handled by someone else as she is able. 2 episodes of coughing on her saliva today, she is agreeable to a repeat MBSS due to h/o dysphagia and significant weight unintentional weight loss. Measured MIP: 31 cm H20 and MEP: 37 cm H20. Initiated training on IMST at 30% of MIP, 9.5. She completed this with effort rated at about 3-4/10 and  occasional min A. She will start with 5 sets of 5 reps twice a day, paying attention to her energy level. Will initiate EMST next week at 30% MEP (11).   07/04/22: Initiated training in use of more frequent breath support for speech to reduce speaking on residual air which results in increased hoarseness. In oral reading task with visual cues to take a breath every 5-6 words, or at natural pauses, Yvonne Jordan required cues not to exhale the breaths,  but to speak on the breath. Education re: reduced pharyngeal sensation as likely cause for choking on saliva with instruction to be mindful to swallow more frequently. She has questioned the use of an incentive spirometer she has at home. We discussed energy conservation and possible RMT. HEP focusing on more frequent breathing in reading tasks provided. Instructed Yvonne Jordan on environmental modifications to maximize  intelligibility and reduce speaking effort at home and over the phone. Written instructions for family and friends provided.      PATIENT EDUCATION: Education details: See today's treatment Person educated: Patient Education method: Explanation, Demonstration, Verbal cues, and Handouts Education comprehension: verbalized understanding, returned demonstration, verbal cues required, and needs further education     GOALS: Goals reviewed with patient? Yes   SHORT TERM GOALS: Target date: 08/01/22   Pt will demonstrate breathing ever 4-7 words to  reduce glottal fry and improve intelligibility as well as conserve energy over 8 minute conversation with rare min A Baseline: Goal status: ONGOING   2.  Pt will follow basic swallow precautions and verbalize awareness of possible silent aspiration d/t h/o of reduced pharyngeal sensation on MBSS 2016.  Baseline:  Goal status: ONGOING   3.  Pt will complete PROM for speech/communication first 1-2 sessions Baseline:  Goal status: ONGOING   4.  Pt will carryover compensatory strategies for dysarthria  over 8 minute conversation with rare min A Baseline:  Goal status: ONGOING   5.  Pt will complete RMST at 50% MIP and MEP to improve cough and maintain PO Baseline:  Goal status: ONGOING   6.  Pt will carryover 3 environmental strategies (including family education and behavior modification) to maximize intelligibility while expanding minimal energy Baseline:  Goal status: ONGOING     7. Pt will demonstrate use of multimodal communication, AAC  over 2 sessions to plan for communication with disease progression Baseline: Goal Status:ONGOING     LONG TERM GOALS: Target date: 09/26/22   Pt will carryover breath support and compensatory strategies for dysarthria to be 100% intelligible over 15 minute conversation at home or in community settings Baseline:  Goal status: ONGOING   2.  Repeat MBSS as indicated and follow swallow precautions/ diet modifications with mod I Baseline:  Goal status: ONGOING   3.  Complete RMST at 60% of MIP/MEP to prolong PO intake and breath support for speech Baseline:  Goal status:ONGOING   4.  Pt will carryover 2 energy conservation strategies for speech and swallowing with rare min A over 1 week Baseline:  Goal status: ONGOING   5.  Pt will report 50% reduced request for repetitions, subjectively, on phone calls over 1 week Baseline:  Goal status: ONGOING   6.  Pt will improve score on PROM by 2 point Baseline:  Goal status: ONGOING   ASSESSMENT:   CLINICAL IMPRESSION: Patient is a 76 y.o. female who was seen today for concerns re: her speech and voice. Yvonne Jordan reports increased hoarseness which affects her intelligibility especially over the phone. Today she presents with mild dysarthria and mild to moderate dysphonia. Today she is 100% intelligible in this quiet environment face to face.  Yvonne Jordan does endorse her speech and voice worsen throughout the day. In light of upper and lower motor neuron disease, I recommend skilled ST to maximize  intelligibility and safety of swallow, focusing on maintaining current level, energy conservation, training for compensatory strategies and planning for communication options as the disease progresses. Repeat MBSS will be warranted if Yvonne Jordan is in agreement. 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   OBJECTIVE IMPAIRMENTS include memory, dysarthria, voice disorder, and dysphagia. These impairments are limiting patient from effectively communicating at home and in community and safety when swallowing. Factors affecting potential to achieve goals and functional outcome are medical prognosis. Patient will benefit from skilled SLP services to address above impairments  and improve overall function.   REHAB POTENTIAL: Good   PLAN: SLP FREQUENCY: 2x/week   SLP DURATION: 12 weeks   PLANNED INTERVENTIONS: Aspiration precaution training, Pharyngeal strengthening exercises, Diet toleration management , Environmental controls, Trials of upgraded texture/liquids, Cueing hierachy, Cognitive reorganization, Internal/external aids, Functional tasks, Multimodal communication approach, SLP instruction and feedback, Compensatory strategies, Patient/family education, Re-evaluation, and objective swallow study (FEES/MBSS)         Yvonne Jordan, Annye Rusk, [place] 07/16/2022, 2:59 PM

## 2022-07-16 NOTE — Patient Instructions (Signed)
   [place] milk and chocolate milk has more protein   Consider ordering groceries on line to save your energy  We will work up to the blue respiratory trainer - start with the clear one. The exhale trainer says PEP and the inhale says IMT   Augmentative Alternative Communication for ALS  TOBII [employer]   Eye gaze board

## 2022-07-17 ENCOUNTER — Ambulatory Visit (HOSPITAL_COMMUNITY)
Admission: RE | Admit: 2022-07-17 | Discharge: 2022-07-17 | Disposition: A | Discharge status: Home / Self Care | Payer: Medicare Other | Source: Ambulatory Visit | Attending: Family Medicine | Admitting: Family Medicine

## 2022-07-17 DIAGNOSIS — R1312 Dysphagia, oropharyngeal phase: Secondary | ICD-10-CM | POA: Insufficient documentation

## 2022-07-17 DIAGNOSIS — R059 Cough, unspecified: Secondary | ICD-10-CM

## 2022-07-17 DIAGNOSIS — R1310 Dysphagia, unspecified: Secondary | ICD-10-CM

## 2022-07-17 NOTE — Procedures (Signed)
Objective Swallowing Evaluation: Type of Study: MBS-Modified Barium Swallow Study   Patient Details  Name: Yvonne Jordan MRN: [id number] Date of Birth: 04/09/1946  Today's Date: 07/17/2022 Time: SLP Start Time (ACUTE ONLY): 37 -SLP Stop Time (ACUTE ONLY): 1140  SLP Time Calculation (min) (ACUTE ONLY): 30 min   Past Medical History:  Past Medical History:  Diagnosis Date   Allergic rhinitis    Arthritis    Cancer ([place])    Breast- left   CKD (chronic kidney disease)    stage III   Colon polyp    Dyspnea    GERD (gastroesophageal reflux disease)    History of hiatal hernia    Hyperlipidemia    Personal history of radiation therapy    Stroke [facility])    TIA before 2013   TIA (transient ischemic attack)    Vitamin D deficiency    Past Surgical History:  Past Surgical History:  Procedure Laterality Date   APPENDECTOMY     BREAST LUMPECTOMY Left 2006   BREAST SURGERY     2006...treated with Tamoxifen for 6 years   CHOLECYSTECTOMY     COLONOSCOPY     ESOPHAGOGASTRODUODENOSCOPY N/A 09/01/2020   Procedure: ESOPHAGOGASTRODUODENOSCOPY (EGD);  Surgeon: Lajuana Matte, MD;  Location: [place];  Service: Thoracic;  Laterality: N/A;   XI ROBOTIC ASSISTED PARAESOPHAGEAL HERNIA REPAIR N/A 09/01/2020   Procedure: XI ROBOTIC ASSISTED HIATAL HERNIA REPAIR WITH FUNDOPLICATION AND GASTROPEXY;  Surgeon: Lajuana Matte, MD;  Location: [place];  Service: Thoracic;  Laterality: N/A;   No data recorded  No data recorded   Recommendations for follow up therapy are one component of a multi-disciplinary discharge planning process, led by the attending physician.  Recommendations may be updated based on patient status, additional functional criteria and insurance authorization.  Assessment / Plan / Recommendation     07/17/2022    3:00 PM  Clinical Impressions  Clinical Impression Yvonne Jordan presented with a mild oropharyngeal dysphagia c/b mild delays in oral preparation and delay in  onset of the pharyngeal swallow.  Thin liquids consistently entered the laryngeal vestibule before swallow onset; however there was reliable arytenoid-to-base of epiglottis contact, which served to eject most of the bolus from the larynx.  No aspiration was viewed over multiple swallows of thin liquids and sequential swallows. There was effective pharyngeal clearance. Recommend that she continue regular solids/thin liquids. She may benefit from being cautious when eating foods with a thin liquid base (e.g., cereal with milk, brothy soups with chunks of food). Continue respiratory muscle training, effortful swallow, and perhaps Mendelsohn maneuver with OP SLP.  OT and PT consults should be ordered ASAP.  We are available for repeat MBS at later date should it be of benefit.  SLP Visit Diagnosis Dysphagia, oropharyngeal phase (R13.12)  Impact on safety and function No limitations         07/17/2022    3:00 PM  Treatment Recommendations  Treatment Recommendations Defer treatment plan to f/u with SLP         No data to display             07/17/2022    3:00 PM  Diet Recommendations  SLP Diet Recommendations Regular solids;Thin liquid  Liquid Administration via Cup;Straw  Medication Administration Whole meds with puree         07/17/2022    3:00 PM  Other Recommendations  Oral Care Recommendations Oral care BID  Follow Up Recommendations Outpatient SLP  No data to display               07/17/2022    3:00 PM  Oral Phase  Oral Phase Impaired  Oral - Puree Delayed oral transit  Oral - Pill [facility]       07/17/2022    3:00 PM  Pharyngeal Phase  Pharyngeal Phase Impaired  Pharyngeal- Thin Cup Delayed swallow initiation-pyriform sinuses;Penetration/Aspiration before swallow  Pharyngeal Material enters airway, remains ABOVE vocal cords then ejected out;Material enters airway, remains ABOVE vocal cords and not ejected out  Pharyngeal- Puree Delayed swallow  initiation-vallecula  Pharyngeal- Regular Delayed swallow initiation-vallecula        07/17/2022    3:00 PM  Cervical Esophageal Phase   Cervical Esophageal Phase [facility]   [name] [name], MA CCC/SLP Clinical Specialist - Acute Care SLP Acute Rehabilitation Services Office number [phone]   Yvonne Jordan 07/17/2022, 3:35 PM

## 2022-07-18 ENCOUNTER — Encounter: Payer: Self-pay | Admitting: Speech Pathology

## 2022-07-18 ENCOUNTER — Ambulatory Visit: Payer: Medicare Other | Admitting: Speech Pathology

## 2022-07-18 DIAGNOSIS — R498 Other voice and resonance disorders: Secondary | ICD-10-CM | POA: Diagnosis not present

## 2022-07-18 DIAGNOSIS — R1312 Dysphagia, oropharyngeal phase: Secondary | ICD-10-CM

## 2022-07-18 DIAGNOSIS — R471 Dysarthria and anarthria: Secondary | ICD-10-CM | POA: Diagnosis not present

## 2022-07-18 NOTE — Patient Instructions (Addendum)
  SWALLOWING EXERCISES Effortful Swallows - Squeeze hard with the muscles in your neck while you swallow your  saliva or a sip of water - Repeat 20 times, 2-3 times a day, and use whenever you eat or drink    Mendelsohn Maneuver -  swallow as tight as you  for 5 seconds - Start to swallow, and keep your Adam's apple up by squeezing tight with the muscles of the throat - Hold the squeeze for 5-7 seconds and then relax - Repeat 20 times, 2-3 times a day  [email or web address]  Ask family about your "isms" when you talk  -bank these  [email or web address]   [name] Spokeman voice amplifier -  To use less energy for speech especially in restaurants, family gatherings, times you want to have extended conversations Microphones, lanyards, ear pieces are all variable according to user preference  Family should take care not to interrupt you, especially over the phone where they can see any visual cues that you are still wanting to talk  Review the family rules for communicating with your family  Consider a dietician consult   Avoid mixed consistencies that have a solid and liquid component, such as cereals, soups with meat or veggies, fruit cocktail

## 2022-07-18 NOTE — Therapy (Signed)
OUTPATIENT SPEECH LANGUAGE PATHOLOGY TREATMENT NOTE   Patient Name: Yvonne Jordan MRN: [id number] DOB:12/31/1945, 76 y.o., female Today's Date: 07/18/2022  PCP: Antony Contras, MD  REFERRING PROVIDER: Antony Contras, MD   END OF SESSION:   End of Session - 07/18/22 1236     Visit Number 5    Number of Visits 25    Date for SLP Re-Evaluation 09/26/22    SLP Start Time 95    SLP Stop Time  7619    SLP Time Calculation (min) 45 min    Activity Tolerance Patient tolerated treatment well             Past Medical History:  Diagnosis Date   Allergic rhinitis    Arthritis    Cancer ([place])    Breast- left   CKD (chronic kidney disease)    stage III   Colon polyp    Dyspnea    GERD (gastroesophageal reflux disease)    History of hiatal hernia    Hyperlipidemia    Personal history of radiation therapy    Stroke [facility])    TIA before 2013   TIA (transient ischemic attack)    Vitamin D deficiency    Past Surgical History:  Procedure Laterality Date   APPENDECTOMY     BREAST LUMPECTOMY Left 2006   BREAST SURGERY     2006...treated with Tamoxifen for 6 years   CHOLECYSTECTOMY     COLONOSCOPY     ESOPHAGOGASTRODUODENOSCOPY N/A 09/01/2020   Procedure: ESOPHAGOGASTRODUODENOSCOPY (EGD);  Surgeon: Lajuana Matte, MD;  Location: [place];  Service: Thoracic;  Laterality: N/A;   XI ROBOTIC ASSISTED PARAESOPHAGEAL HERNIA REPAIR N/A 09/01/2020   Procedure: XI ROBOTIC ASSISTED HIATAL HERNIA REPAIR WITH FUNDOPLICATION AND GASTROPEXY;  Surgeon: Lajuana Matte, MD;  Location: [place];  Service: Thoracic;  Laterality: N/A;   Patient Active Problem List   Diagnosis Date Noted   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   Weakness 05/08/2022   Left foot drop 05/08/2022   Dysarthria 05/08/2022   Gait abnormality 05/08/2022   S/P repair of paraesophageal hernia 09/01/2020   ILD (interstitial lung disease) ([place]) 07/21/2020   Pain in right knee 12/30/2018   Trochanteric bursitis  of right hip 12/30/2018   Low back pain 04/19/2016   Abnormality of gait 03/29/2016   Fall 03/29/2016   Dyspnea 11/10/2014   GERD (gastroesophageal reflux disease) 11/10/2014   TIA (transient ischemic attack)    Vitamin D deficiency    Allergic rhinitis    CKD (chronic kidney disease)    Colon polyp    Hyperlipidemia     ONSET DATE: 2023 dx with upper and lower motor neuron disease  REFERRING DIAG: R47.1 (ICD-10-CM) - Dysarthria and anarthria   THERAPY DIAG:  Dysphagia, oropharyngeal phase  Dysarthria and anarthria  Other voice and resonance disorders  Rationale for Evaluation and Treatment Rehabilitation  SUBJECTIVE: "I have an appointment on Tuesday" re: MBSS PAIN:  Are you having pain? No   OBJECTIVE:   TODAY'S TREATMENT:  07/18/22: MBSS completed yesterday - reviewed recommendations and initiated HEP for dysphagia of effortful swallow and mendelson. Effortful swallow with occasional min A 10/10 and Mendelson 0/8 attempts with max A. She will continue to practice this at home. Education provided on use of personal voice amplification to reduce effort of intelligible speech, especially in noisy environments. Provided handout with [name] information. Trained Sarabella on strategies her family should implement to maximize ease of speech and intelligibility. Including instructing them to not  interrupt her or finish her message for her or talk over her. Provided information re: voice banking.  07/16/22: Initiated EMST on the [employer] Threshold PEP at 8-9 cm H20, less than 30% MEP with rare min A. She completed 5 sets of 5 reps with mod I and denies fatigue IMT not changed, completed 5 sets of 5 reps with mod I. Consuelo reports she is tolerating RMST without reduced energy or fatigue. Educated how to tell the difference in EMT trainer vs IMT trainer. Brittnie required verbal cues 3x to ID when she is speaking on residual air with increased vocal fry, reduced volume and  increased effort to speak. Mod A to take more frequent breaths and to be aware of increased hoarseness and effort speaking as cue that she needs to breathe before continuing conversing.Initiated education of options for AAC. As her spouse is in a wheelchair and it takes effort to get groceries, encouraged her to order online groceries. MBSS tomorrow  07/11/22: MBSS scheduled for Tuesday. Jeweldean reports she has educated her spouse on strategies he can use to support her ease of communication and reduce need for repetition. Targeted breath support for speech, speaking few words on exhale and breathing more frequently to reduce effort of speech and speaking on residual air. At this time, Zyliah is avoiding phone calls due to reduced intelligibility. She has not called [facility] to see if she is still on the waitlist for a sooner appointment. We generated a script of what she wanted to say, including her name, DOB and questions. She demonstrated slow rate and over articulation and pausing to improve intelligibility over the phone in role play. Also instructed her to have her husband call if that is easier. She is also avoiding phone calls from extended family. We generated a strategy of letting the other person know "the doctor said I can only talk for 15 minutes" as a strategy to conserve speech/voice and energy. Trained in strategies to maximize nutrition and minimize effort of eating/swallowing. Provided handout re: maximizing nutrition in ALS. Weight stable last week, and down 1.5 lbs this week.     07/09/22: Trained Hodaya in 3 environmental modifications to improve ease of communication and reduce need for repetition to conserve her voice, speech  and energy. She notes voice worsens as day progresses. Instructed her on resting her speech and voice if she has important evening events. She has not educated her family on strategies they can use to make communication easier in person and over the phone. I continue  to encourage her to do this. She reports her spouse often wants to talk to her from across the house. It is imperative that Kaylor instruct him to be as close as possible to reduce her effort required to be understood. See pt instructions. General energy conservation strategies including setting priorities and letting less important tasks be handled by someone else as she is able. 2 episodes of coughing on her saliva today, she is agreeable to a repeat MBSS due to h/o dysphagia and significant weight unintentional weight loss. Measured MIP: 31 cm H20 and MEP: 37 cm H20. Initiated training on IMST at 30% of MIP, 9.5. She completed this with effort rated at about 3-4/10 and occasional min A. She will start with 5 sets of 5 reps twice a day, paying attention to her energy level. Will initiate EMST next week at 30% MEP (11).   07/04/22: Initiated training in use of more frequent breath support for speech to reduce  speaking on residual air which results in increased hoarseness. In oral reading task with visual cues to take a breath every 5-6 words, or at natural pauses, Laquanta required cues not to exhale the breaths,  but to speak on the breath. Education re: reduced pharyngeal sensation as likely cause for choking on saliva with instruction to be mindful to swallow more frequently. She has questioned the use of an incentive spirometer she has at home. We discussed energy conservation and possible RMT. HEP focusing on more frequent breathing in reading tasks provided. Instructed Shanda on environmental modifications to maximize intelligibility and reduce speaking effort at home and over the phone. Written instructions for family and friends provided.      PATIENT EDUCATION: Education details: See today's treatment Person educated: Patient Education method: Explanation, Demonstration, Verbal cues, and Handouts Education comprehension: verbalized understanding, returned demonstration, verbal cues required, and  needs further education     GOALS: Goals reviewed with patient? Yes   SHORT TERM GOALS: Target date: 08/01/22   Pt will demonstrate breathing ever 4-7 words to  reduce glottal fry and improve intelligibility as well as conserve energy over 8 minute conversation with rare min A Baseline: Goal status: ONGOING   2.  Pt will follow basic swallow precautions and verbalize awareness of possible silent aspiration d/t h/o of reduced pharyngeal sensation on MBSS 2016.  Baseline:  Goal status: ONGOING   3.  Pt will complete PROM for speech/communication first 1-2 sessions Baseline:  Goal status: ONGOING   4.  Pt will carryover compensatory strategies for dysarthria over 8 minute conversation with rare min A Baseline:  Goal status: ONGOING   5.  Pt will complete RMST at 50% MIP and MEP to improve cough and maintain PO Baseline:  Goal status: ONGOING   6.  Pt will carryover 3 environmental strategies (including family education and behavior modification) to maximize intelligibility while expanding minimal energy Baseline:  Goal status: ONGOING     7. Pt will demonstrate use of multimodal communication, AAC  over 2 sessions to plan for communication with disease progression Baseline: Goal Status:ONGOING     LONG TERM GOALS: Target date: 09/26/22   Pt will carryover breath support and compensatory strategies for dysarthria to be 100% intelligible over 15 minute conversation at home or in community settings Baseline:  Goal status: ONGOING   2.  Repeat MBSS as indicated and follow swallow precautions/ diet modifications with mod I Baseline:  Goal status: ONGOING   3.  Complete RMST at 60% of MIP/MEP to prolong PO intake and breath support for speech Baseline:  Goal status:ONGOING   4.  Pt will carryover 2 energy conservation strategies for speech and swallowing with rare min A over 1 week Baseline:  Goal status: ONGOING   5.  Pt will report 50% reduced request for repetitions,  subjectively, on phone calls over 1 week Baseline:  Goal status: ONGOING   6.  Pt will improve score on PROM by 2 point Baseline:  Goal status: ONGOING   ASSESSMENT:   CLINICAL IMPRESSION: Patient is a 76 y.o. female who was seen today for concerns re: her speech and voice. Tynasia reports increased hoarseness which affects her intelligibility especially over the phone. Today she presents with mild dysarthria and mild to moderate dysphonia. Today she is 100% intelligible in this quiet environment face to face.  Janiya does endorse her speech and voice worsen throughout the day. In light of upper and lower motor neuron disease, I recommend skilled ST  to maximize intelligibility and safety of swallow, focusing on maintaining current level, energy conservation, training for compensatory strategies and planning for communication options as the disease progresses. Repeat MBSS will be warranted if Santiago is in agreement. 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   OBJECTIVE IMPAIRMENTS include memory, dysarthria, voice disorder, and dysphagia. These impairments are limiting patient from effectively communicating at home and in community and safety when swallowing. Factors affecting potential to achieve goals and functional outcome are medical prognosis. Patient will benefit from skilled SLP services to address above impairments and improve overall function.   REHAB POTENTIAL: Good   PLAN: SLP FREQUENCY: 2x/week   SLP DURATION: 12 weeks   PLANNED INTERVENTIONS: Aspiration precaution training, Pharyngeal strengthening exercises, Diet toleration management , Environmental controls, Trials of upgraded texture/liquids, Cueing hierachy, Cognitive reorganization, Internal/external aids, Functional tasks, Multimodal communication approach, SLP instruction and feedback, Compensatory strategies, Patient/family education, Re-evaluation, and objective swallow study  (FEES/MBSS)         [place], Annye Rusk, [place] 07/18/2022, 3:44 PM

## 2022-07-19 ENCOUNTER — Other Ambulatory Visit: Payer: Self-pay | Admitting: Family Medicine

## 2022-07-19 DIAGNOSIS — Z1231 Encounter for screening mammogram for malignant neoplasm of breast: Secondary | ICD-10-CM

## 2022-07-23 ENCOUNTER — Encounter: Payer: Self-pay | Admitting: Speech Pathology

## 2022-07-23 ENCOUNTER — Ambulatory Visit: Payer: Medicare Other | Admitting: Speech Pathology

## 2022-07-23 DIAGNOSIS — R1312 Dysphagia, oropharyngeal phase: Secondary | ICD-10-CM | POA: Diagnosis not present

## 2022-07-23 DIAGNOSIS — R498 Other voice and resonance disorders: Secondary | ICD-10-CM | POA: Diagnosis not present

## 2022-07-23 DIAGNOSIS — R471 Dysarthria and anarthria: Secondary | ICD-10-CM | POA: Diagnosis not present

## 2022-07-23 NOTE — Therapy (Signed)
OUTPATIENT SPEECH LANGUAGE PATHOLOGY TREATMENT NOTE   Patient Name: Yvonne Jordan MRN: [id number] DOB:[birth year], 76 y.o., female Today's Date: 07/23/2022  PCP: Antony Contras, MD  REFERRING PROVIDER: Antony Contras, MD   END OF SESSION:   End of Session - 07/23/22 1318     Visit Number 6    Number of Visits 25    Date for SLP Re-Evaluation 09/26/22    SLP Start Time 1315    SLP Stop Time  1400    SLP Time Calculation (min) 45 min    Activity Tolerance Patient tolerated treatment well             Past Medical History:  Diagnosis Date   Allergic rhinitis    Arthritis    Cancer ([place])    Breast- left   CKD (chronic kidney disease)    stage III   Colon polyp    Dyspnea    GERD (gastroesophageal reflux disease)    History of hiatal hernia    Hyperlipidemia    Personal history of radiation therapy    Stroke [facility])    TIA before 2013   TIA (transient ischemic attack)    Vitamin D deficiency    Past Surgical History:  Procedure Laterality Date   APPENDECTOMY     BREAST LUMPECTOMY Left 2006   BREAST SURGERY     2006...treated with Tamoxifen for 6 years   CHOLECYSTECTOMY     COLONOSCOPY     ESOPHAGOGASTRODUODENOSCOPY N/A 09/01/2020   Procedure: ESOPHAGOGASTRODUODENOSCOPY (EGD);  Surgeon: Lajuana Matte, MD;  Location: [facility] OR;  Service: Thoracic;  Laterality: N/A;   XI ROBOTIC ASSISTED PARAESOPHAGEAL HERNIA REPAIR N/A 09/01/2020   Procedure: XI ROBOTIC ASSISTED HIATAL HERNIA REPAIR WITH FUNDOPLICATION AND GASTROPEXY;  Surgeon: Lajuana Matte, MD;  Location: [place];  Service: Thoracic;  Laterality: N/A;   Patient Active Problem List   Diagnosis Date Noted   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   Weakness 05/08/2022   Left foot drop 05/08/2022   Dysarthria 05/08/2022   Gait abnormality 05/08/2022   S/P repair of paraesophageal hernia 09/01/2020   ILD (interstitial lung disease) ([place]) 07/21/2020   Pain in right knee 12/30/2018   Trochanteric bursitis  of right hip 12/30/2018   Low back pain 04/19/2016   Abnormality of gait 03/29/2016   Fall 03/29/2016   Dyspnea 11/10/2014   GERD (gastroesophageal reflux disease) 11/10/2014   TIA (transient ischemic attack)    Vitamin D deficiency    Allergic rhinitis    CKD (chronic kidney disease)    Colon polyp    Hyperlipidemia     ONSET DATE: 2023 dx with upper and lower motor neuron disease  REFERRING DIAG: R47.1 (ICD-10-CM) - Dysarthria and anarthria   THERAPY DIAG:  Dysarthria and anarthria  Other voice and resonance disorders  Dysphagia, oropharyngeal phase  Rationale for Evaluation and Treatment Rehabilitation  SUBJECTIVE: "I have an appointment on Tuesday" re: MBSS PAIN:  Are you having pain? No   OBJECTIVE:   TODAY'S TREATMENT:  07/23/22: Advanced IMST to 15 (50% of MIP) and EMST to 13 (35% of MEP) with Trenia rating the effort 5-6/10. Increased RMST to 3 sets of 5 twice a day, with caution to monitor for increased fatigue or worsening speech/voice to ID if this frequency needs to be reduced. Yvonne Jordan verbalized she would monitor this and reduce down to 2 sets of 5 as needed. Today, over 20 minute conversation, Yvonne Jordan did not speak on residual air and demonstrated good breath  support for volume. Volume today averaged 72dB with mod I. She reports improved voice, volume and intelligibility. Yvonne Jordan reports she is having to repeat herself less now. Voice remained strong over 45 minute session. Yvonne Jordan verbalized swallow precautions and instructed her to focus on swallowing when she is out to eat or at social events to maintain airway protection in distracting environments. Introduced Patent examiner to the [employer] and options for head sets and microphones. Educated [place] on [employer] meal delivery which has options for meals with extra protein. She reports 2lb weight loss over past week.   07/18/22: MBSS completed yesterday - reviewed recommendations and initiated  HEP for dysphagia of effortful swallow and mendelson. Effortful swallow with occasional min A 10/10 and Mendelson 0/8 attempts with max A. She will continue to practice this at home. Education provided on use of personal voice amplification to reduce effort of intelligible speech, especially in noisy environments. Provided handout with [name] information. Trained Brailyn on strategies her family should implement to maximize ease of speech and intelligibility. Including instructing them to not interrupt her or finish her message for her or talk over her. Provided information re: voice banking.  07/16/22: Initiated EMST on the [employer] Threshold PEP at 8-9 cm H20, less than 30% MEP with rare min A. She completed 5 sets of 5 reps with mod I and denies fatigue IMT not changed, completed 5 sets of 5 reps with mod I. Yvonne Jordan reports she is tolerating RMST without reduced energy or fatigue. Educated how to tell the difference in EMT trainer vs IMT trainer. Yvonne Jordan required verbal cues 3x to ID when she is speaking on residual air with increased vocal fry, reduced volume and increased effort to speak. Mod A to take more frequent breaths and to be aware of increased hoarseness and effort speaking as cue that she needs to breathe before continuing conversing.Initiated education of options for AAC. As her spouse is in a wheelchair and it takes effort to get groceries, encouraged her to order online groceries. MBSS tomorrow  07/11/22: MBSS scheduled for Tuesday. Tasmin reports she has educated her spouse on strategies he can use to support her ease of communication and reduce need for repetition. Targeted breath support for speech, speaking few words on exhale and breathing more frequently to reduce effort of speech and speaking on residual air. At this time, Yvonne Jordan is avoiding phone calls due to reduced intelligibility. She has not called [facility] to see if she is still on the waitlist for a sooner appointment. We  generated a script of what she wanted to say, including her name, DOB and questions. She demonstrated slow rate and over articulation and pausing to improve intelligibility over the phone in role play. Also instructed her to have her husband call if that is easier. She is also avoiding phone calls from extended family. We generated a strategy of letting the other person know "the doctor said I can only talk for 15 minutes" as a strategy to conserve speech/voice and energy. Trained in strategies to maximize nutrition and minimize effort of eating/swallowing. Provided handout re: maximizing nutrition in ALS. Weight stable last week, and down 1.5 lbs this week.     07/09/22: Trained Yvonne Jordan in 3 environmental modifications to improve ease of communication and reduce need for repetition to conserve her voice, speech  and energy. She notes voice worsens as day progresses. Instructed her on resting her speech and voice if she has important evening events. She has not educated  her family on strategies they can use to make communication easier in person and over the phone. I continue to encourage her to do this. She reports her spouse often wants to talk to her from across the house. It is imperative that Saesha instruct him to be as close as possible to reduce her effort required to be understood. See pt instructions. General energy conservation strategies including setting priorities and letting less important tasks be handled by someone else as she is able. 2 episodes of coughing on her saliva today, she is agreeable to a repeat MBSS due to h/o dysphagia and significant weight unintentional weight loss. Measured MIP: 31 cm H20 and MEP: 37 cm H20. Initiated training on IMST at 30% of MIP, 9.5. She completed this with effort rated at about 3-4/10 and occasional min A. She will start with 5 sets of 5 reps twice a day, paying attention to her energy level. Will initiate EMST next week at 30% MEP (11).   PATIENT  EDUCATION: Education details: See today's treatment Person educated: Patient Education method: Explanation, Demonstration, Verbal cues, and Handouts Education comprehension: verbalized understanding, returned demonstration, verbal cues required, and needs further education     GOALS: Goals reviewed with patient? Yes   SHORT TERM GOALS: Target date: 08/01/22   Pt will demonstrate breathing ever 4-7 words to  reduce glottal fry and improve intelligibility as well as conserve energy over 8 minute conversation with rare min A Baseline: Goal status: ACHIEVED   2.  Pt will follow basic swallow precautions and verbalize awareness of possible silent aspiration d/t h/o of reduced pharyngeal sensation on MBSS 2016.  Baseline:  Goal status: ACHIEVED   3.  Pt will complete PROM for speech/communication first 1-2 sessions Baseline:  Goal status: ACHIEVED   4.  Pt will carryover compensatory strategies for dysarthria over 8 minute conversation with rare min A Baseline:  Goal status: ONGOING   5.  Pt will complete RMST at 50% MIP and MEP to improve cough and maintain PO Baseline:  Goal status: ONGOING   6.  Pt will carryover 3 environmental strategies (including family education and behavior modification) to maximize intelligibility while expanding minimal energy Baseline:  Goal status: ONGOING     7. Pt will demonstrate use of multimodal communication, AAC  over 2 sessions to plan for communication with disease progression Baseline: Goal Status:ONGOING     LONG TERM GOALS: Target date: 09/26/22   Pt will carryover breath support and compensatory strategies for dysarthria to be 100% intelligible over 15 minute conversation at home or in community settings Baseline:  Goal status: ONGOING   2.  Repeat MBSS as indicated and follow swallow precautions/ diet modifications with mod I Baseline:  Goal status: ONGOING   3.  Complete RMST at 60% of MIP/MEP to prolong PO intake and breath  support for speech Baseline:  Goal status:ONGOING   4.  Pt will carryover 2 energy conservation strategies for speech and swallowing with rare min A over 1 week Baseline:  Goal status: ONGOING   5.  Pt will report 50% reduced request for repetitions, subjectively, on phone calls over 1 week Baseline:  Goal status: ONGOING   6.  Pt will improve score on PROM by 2 point Baseline:  Goal status: ONGOING   ASSESSMENT:   CLINICAL IMPRESSION: Patient is a 76 y.o. female who was seen today for concerns re: her speech and voice. With RMST at 30-50% MIP/MEP over past 2 weeks, she reports improved voice  with reduced hoarseness and good volume  Thersia reports she is having to repeat herself less, which conserves energy. She has reduced speaking on residual breath, which has eliminated increased vocal fry and improved intelligibility.  I recommend skilled ST to maximize intelligibility and safety of swallow, focusing on maintaining current level, energy conservation, training for compensatory strategies and planning for communication options as the disease progresses. PT/OT evals recommended. Amaka is aware and will reach out to her PCP for referrals. In light of pt's good progress and mod I with HEP/RMST, will decrease to 1x a week.    OBJECTIVE IMPAIRMENTS include memory, dysarthria, voice disorder, and dysphagia. These impairments are limiting patient from effectively communicating at home and in community and safety when swallowing. Factors affecting potential to achieve goals and functional outcome are medical prognosis. Patient will benefit from skilled SLP services to address above impairments and improve overall function.   REHAB POTENTIAL: Good   PLAN: SLP FREQUENCY: 2x/week   SLP DURATION: 12 weeks   PLANNED INTERVENTIONS: Aspiration precaution training, Pharyngeal strengthening exercises, Diet toleration management , Environmental controls, Trials of upgraded texture/liquids, Cueing  hierachy, Cognitive reorganization, Internal/external aids, Functional tasks, Multimodal communication approach, SLP instruction and feedback, Compensatory strategies, Patient/family education, Re-evaluation, and objective swallow study (FEES/MBSS)         [place], Annye Rusk, [place] 07/23/2022, 2:12 PM

## 2022-07-23 NOTE — Patient Instructions (Signed)
   When you are eating out or eating at a party or get together, be mindful of continuing to focus on your swallow even though the environment is distracting  We increased your respiratory trainers, pay attention to how you feel  We also increased to 3 sets of 5 twice a day but listen to your body  Your voice and volume is nice a strong today - keep up the good work  [employer] deliver pre-made meals to your home, and you can order extra protein meals   Do stretching exercises on your own - [employer] stretch classes on YouTube

## 2022-07-25 ENCOUNTER — Ambulatory Visit: Payer: Medicare Other | Admitting: Speech Pathology

## 2022-07-30 ENCOUNTER — Encounter: Payer: Self-pay | Admitting: Speech Pathology

## 2022-07-30 ENCOUNTER — Ambulatory Visit: Payer: Medicare Other | Attending: Family Medicine | Admitting: Speech Pathology

## 2022-07-30 DIAGNOSIS — R498 Other voice and resonance disorders: Secondary | ICD-10-CM | POA: Insufficient documentation

## 2022-07-30 DIAGNOSIS — R471 Dysarthria and anarthria: Secondary | ICD-10-CM | POA: Diagnosis not present

## 2022-07-30 DIAGNOSIS — Z9181 History of falling: Secondary | ICD-10-CM | POA: Insufficient documentation

## 2022-07-30 DIAGNOSIS — M6281 Muscle weakness (generalized): Secondary | ICD-10-CM | POA: Diagnosis not present

## 2022-07-30 DIAGNOSIS — R1312 Dysphagia, oropharyngeal phase: Secondary | ICD-10-CM | POA: Diagnosis not present

## 2022-07-30 DIAGNOSIS — R2689 Other abnormalities of gait and mobility: Secondary | ICD-10-CM | POA: Insufficient documentation

## 2022-07-30 NOTE — Therapy (Signed)
OUTPATIENT SPEECH LANGUAGE PATHOLOGY TREATMENT NOTE   Patient Name: Yvonne Jordan MRN: [id number] DOB:1946/08/29, 76 y.o., female Today's Date: 07/30/2022  PCP: Antony Contras, MD  REFERRING PROVIDER: Antony Contras, MD   END OF SESSION:   End of Session - 07/30/22 1323     Visit Number 7    Number of Visits 25    Date for SLP Re-Evaluation 09/26/22    SLP Start Time 1319   pt arrived 4 minutes late   Activity Tolerance Patient tolerated treatment well             Past Medical History:  Diagnosis Date   Allergic rhinitis    Arthritis    Cancer ([place])    Breast- left   CKD (chronic kidney disease)    stage III   Colon polyp    Dyspnea    GERD (gastroesophageal reflux disease)    History of hiatal hernia    Hyperlipidemia    Personal history of radiation therapy    [facility]roke [facility])    TIA before 2013   TIA (transient ischemic attack)    Vitamin D deficiency    Past Surgical History:  Procedure Laterality Date   APPENDECTOMY     BREAST LUMPECTOMY Left 2006   BREAST SURGERY     2006...treated with Tamoxifen for 6 years   CHOLECYSTECTOMY     COLONOSCOPY     ESOPHAGOGASTRODUODENOSCOPY N/A 09/01/2020   Procedure: ESOPHAGOGASTRODUODENOSCOPY (EGD);  Surgeon: Lajuana Matte, MD;  Location: [facility] OR;  Service: Thoracic;  Laterality: N/A;   XI ROBOTIC ASSISTED PARAESOPHAGEAL HERNIA REPAIR N/A 09/01/2020   Procedure: XI ROBOTIC ASSISTED HIATAL HERNIA REPAIR WITH FUNDOPLICATION AND GASTROPEXY;  Surgeon: Lajuana Matte, MD;  Location: [place];  Service: Thoracic;  Laterality: N/A;   Patient Active Problem List   Diagnosis Date Noted   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   Weakness 05/08/2022   Left foot drop 05/08/2022   Dysarthria 05/08/2022   Gait abnormality 05/08/2022   S/P repair of paraesophageal hernia 09/01/2020   ILD (interstitial lung disease) ([place]) 07/21/2020   Pain in right knee 12/30/2018   Trochanteric bursitis of right hip 12/30/2018   Low back  pain 04/19/2016   Abnormality of gait 03/29/2016   Fall 03/29/2016   Dyspnea 11/10/2014   GERD (gastroesophageal reflux disease) 11/10/2014   TIA (transient ischemic attack)    Vitamin D deficiency    Allergic rhinitis    CKD (chronic kidney disease)    Colon polyp    Hyperlipidemia     ONSET DATE: 2023 dx with upper and lower motor neuron disease  REFERRING DIAG: R47.1 (ICD-10-CM) - Dysarthria and anarthria   THERAPY DIAG:  Dysarthria and anarthria  Other voice and resonance disorders  Dysphagia, oropharyngeal phase  Rationale for Evaluation and Treatment Rehabilitation  SUBJECTIVE: "I have an appointment on Tuesday" re: MBSS PAIN:  Are you having pain? No   OBJECTIVE:   TODAY'S TREATMENT:  07/30/22: Yvonne Jordan completed 5 sets of 5 reps EMST, by 4th set, fatigue noted with no air release - instructed Yvonne Jordan to take breaks as needed to maintain audible "bike pump" or air release. Volume remains strong and averaged 71dB with mod I over 25 minutes.  Yvonne Jordan continues to report improved intelligibility outside of therapy. Weight remains stable. Continues to deny any swallowing difficulties. Education re: future possibility of TF to support nutrition/PO intake.   07/23/22: Advanced IMST to 15 (50% of MIP) and EMST to 13 (35% of MEP) with  Yvonne Jordan rating the effort 5-6/10. Increased RMST to 3 sets of 5 twice a day, with caution to monitor for increased fatigue or worsening speech/voice to ID if this frequency needs to be reduced. Caitlyne verbalized she would monitor this and reduce down to 2 sets of 5 as needed. Today, over 20 minute conversation, Yvonne Jordan did not speak on residual air and demonstrated good breath support for volume. Volume today averaged 72dB with mod I. She reports improved voice, volume and intelligibility. Yvonne Jordan reports she is having to repeat herself less now. Voice remained strong over 45 minute session. Yvonne Jordan verbalized swallow precautions and instructed her to  focus on swallowing when she is out to eat or at social events to maintain airway protection in distracting environments. Introduced Patent examiner to the [employer] and options for head sets and microphones. Educated [place] on [employer] meal delivery which has options for meals with extra protein. She reports 2lb weight loss over past week.   07/18/22: MBSS completed yesterday - reviewed recommendations and initiated HEP for dysphagia of effortful swallow and mendelson. Effortful swallow with occasional min A 10/10 and Mendelson 0/8 attempts with max A. She will continue to practice this at home. Education provided on use of personal voice amplification to reduce effort of intelligible speech, especially in noisy environments. Provided handout with [name] information. Trained [place] on strategies her family should implement to maximize ease of speech and intelligibility. Including instructing them to not interrupt her or finish her message for her or talk over her. Provided information re: voice banking.  07/16/22: Initiated EMST on the [employer] Threshold PEP at 8-9 cm H20, less than 30% MEP with rare min A. She completed 5 sets of 5 reps with mod I and denies fatigue IMT not changed, completed 5 sets of 5 reps with mod I. Arvetta reports she is tolerating RMST without reduced energy or fatigue. Educated how to tell the difference in EMT trainer vs IMT trainer. Abbye required verbal cues 3x to ID when she is speaking on residual air with increased vocal fry, reduced volume and increased effort to speak. Mod A to take more frequent breaths and to be aware of increased hoarseness and effort speaking as cue that she needs to breathe before continuing conversing.Initiated education of options for AAC. As her spouse is in a wheelchair and it takes effort to get groceries, encouraged her to order online groceries. MBSS tomorrow  07/11/22: MBSS scheduled for Tuesday. Brianny reports  she has educated her spouse on strategies he can use to support her ease of communication and reduce need for repetition. Targeted breath support for speech, speaking few words on exhale and breathing more frequently to reduce effort of speech and speaking on residual air. At this time, Yvonne Jordan is avoiding phone calls due to reduced intelligibility. She has not called Yvonne Jordan to see if she is still on the waitlist for a sooner appointment. We generated a script of what she wanted to say, including her name, DOB and questions. She demonstrated slow rate and over articulation and pausing to improve intelligibility over the phone in role play. Also instructed her to have her husband call if that is easier. She is also avoiding phone calls from extended family. We generated a strategy of letting the other person know "the doctor said I can only talk for 15 minutes" as a strategy to conserve speech/voice and energy. Trained in strategies to maximize nutrition and minimize effort of eating/swallowing. Provided handout re: maximizing nutrition  in [place]. Weight stable last week, and down 1.5 lbs this week.     07/09/22: Trained Yvonne Jordan in 3 environmental modifications to improve ease of communication and reduce need for repetition to conserve her voice, speech  and energy. She notes voice worsens as day progresses. Instructed her on resting her speech and voice if she has important evening events. She has not educated her family on strategies they can use to make communication easier in person and over the phone. I continue to encourage her to do this. She reports her spouse often wants to talk to her from across the house. It is imperative that Yvonne Jordan instruct him to be as close as possible to reduce her effort required to be understood. See pt instructions. General energy conservation strategies including setting priorities and letting less important tasks be handled by someone else as she is able. 2 episodes of coughing  on her saliva today, she is agreeable to a repeat MBSS due to h/o dysphagia and significant weight unintentional weight loss. Measured MIP: 31 cm H20 and MEP: 37 cm H20. Initiated training on IMST at 30% of MIP, 9.5. She completed this with effort rated at about 3-4/10 and occasional min A. She will start with 5 sets of 5 reps twice a day, paying attention to her energy level. Will initiate EMST next week at 30% MEP (11).   PATIENT EDUCATION: Education details: See today's treatment Person educated: Patient Education method: Explanation, Demonstration, Verbal cues, and Handouts Education comprehension: verbalized understanding, returned demonstration, verbal cues required, and needs further education     GOALS: Goals reviewed with patient? Yes   SHORT TERM GOALS: Target date: 08/01/22   Pt will demonstrate breathing ever 4-7 words to  reduce glottal fry and improve intelligibility as well as conserve energy over 8 minute conversation with rare min A Baseline: Goal status: ACHIEVED   2.  Pt will follow basic swallow precautions and verbalize awareness of possible silent aspiration d/t h/o of reduced pharyngeal sensation on MBSS 2016.  Baseline:  Goal status: ACHIEVED   3.  Pt will complete PROM for speech/communication first 1-2 sessions Baseline:  Goal status: ACHIEVED   4.  Pt will carryover compensatory strategies for dysarthria over 8 minute conversation with rare min A Baseline:  Goal status: ONGOING   5.  Pt will complete RMST at 50% MIP and MEP to improve cough and maintain PO Baseline:  Goal status: ONGOING   6.  Pt will carryover 3 environmental strategies (including family education and behavior modification) to maximize intelligibility while expanding minimal energy Baseline:  Goal status: ONGOING     7. Pt will demonstrate use of multimodal communication, AAC  over 2 sessions to plan for communication with disease progression Baseline: Goal Status:ONGOING     LONG  TERM GOALS: Target date: 09/26/22   Pt will carryover breath support and compensatory strategies for dysarthria to be 100% intelligible over 15 minute conversation at home or in community settings Baseline:  Goal status: ONGOING   2.  Repeat MBSS as indicated and follow swallow precautions/ diet modifications with mod I Baseline:  Goal status: ONGOING   3.  Complete RMST at 60% of MIP/MEP to prolong PO intake and breath support for speech Baseline:  Goal status:ONGOING   4.  Pt will carryover 2 energy conservation strategies for speech and swallowing with rare min A over 1 week Baseline:  Goal status: ONGOING   5.  Pt will report 50% reduced request for repetitions, subjectively,  on phone calls over 1 week Baseline:  Goal status: ONGOING   6.  Pt will improve score on PROM by 2 point Baseline:  Goal status: ONGOING   ASSESSMENT:   CLINICAL IMPRESSION: Patient is a 76 y.o. female who was seen today for concerns re: her speech and voice. With RMST at 30-50% MIP/MEP over past 2 weeks, she reports improved voice  with reduced hoarseness and good volume  [place] reports she is having to repeat herself less, which conserves energy. She has reduced speaking on residual breath, which has eliminated increased vocal fry and improved intelligibility.  I recommend skilled ST to maximize intelligibility and safety of swallow, focusing on maintaining current level, energy conservation, training for compensatory strategies and planning for communication options as the disease progresses. PT/OT evals recommended. Kemari is aware and will reach out to her PCP for referrals. In light of pt's good progress and mod I with HEP/RMST, will decrease to 1x a week.    OBJECTIVE IMPAIRMENTS include memory, dysarthria, voice disorder, and dysphagia. These impairments are limiting patient from effectively communicating at home and in community and safety when swallowing. Factors affecting potential to achieve  goals and functional outcome are medical prognosis. Patient will benefit from skilled SLP services to address above impairments and improve overall function.   REHAB POTENTIAL: Good   PLAN: SLP FREQUENCY: 2x/week   SLP DURATION: 12 weeks   PLANNED INTERVENTIONS: Aspiration precaution training, Pharyngeal strengthening exercises, Diet toleration management , Environmental controls, Trials of upgraded texture/liquids, Cueing hierachy, Cognitive reorganization, Internal/external aids, Functional tasks, Multimodal communication approach, SLP instruction and feedback, Compensatory strategies, Patient/family education, Re-evaluation, and objective swallow study (FEES/MBSS)         [place], Annye Rusk, [place] 07/30/2022, 2:04 PM

## 2022-07-30 NOTE — Patient Instructions (Signed)
   When you use your respiratory trainers, make sure you can hear a robust auditory air release - if you don't hear it loudly, take a break for a few minutes then start again - the reps don't have to be done all at once  As this progresses, you may find in easier to eat your bigger meals earlier in the day to get in calories before you fatigue  Or eat 4-6 smaller meals - focus on protein and fat  With a feeding tube, you can still eat - it is there for back up if you don't feel like eating or are too fatigue to eat. You will just have to have a dietician help you figure out how much TF to take depending on how much you eat  You will need to flush the tube several times a day wether you are using it or not

## 2022-08-01 ENCOUNTER — Encounter: Payer: Medicare Other | Admitting: Speech Pathology

## 2022-08-03 ENCOUNTER — Ambulatory Visit
Admission: RE | Admit: 2022-08-03 | Discharge: 2022-08-03 | Disposition: A | Discharge status: Home / Self Care | Payer: Medicare Other | Source: Ambulatory Visit | Attending: Family Medicine | Admitting: Family Medicine

## 2022-08-03 DIAGNOSIS — Z1231 Encounter for screening mammogram for malignant neoplasm of breast: Secondary | ICD-10-CM | POA: Diagnosis not present

## 2022-08-06 ENCOUNTER — Encounter: Payer: Self-pay | Admitting: Speech Pathology

## 2022-08-06 ENCOUNTER — Ambulatory Visit: Payer: Medicare Other | Admitting: Speech Pathology

## 2022-08-06 ENCOUNTER — Ambulatory Visit: Payer: Medicare Other | Admitting: Physical Therapy

## 2022-08-06 DIAGNOSIS — R498 Other voice and resonance disorders: Secondary | ICD-10-CM | POA: Diagnosis not present

## 2022-08-06 DIAGNOSIS — R471 Dysarthria and anarthria: Secondary | ICD-10-CM | POA: Diagnosis not present

## 2022-08-06 DIAGNOSIS — R1312 Dysphagia, oropharyngeal phase: Secondary | ICD-10-CM | POA: Diagnosis not present

## 2022-08-06 DIAGNOSIS — R2689 Other abnormalities of gait and mobility: Secondary | ICD-10-CM

## 2022-08-06 DIAGNOSIS — M6281 Muscle weakness (generalized): Secondary | ICD-10-CM

## 2022-08-06 DIAGNOSIS — Z9181 History of falling: Secondary | ICD-10-CM | POA: Diagnosis not present

## 2022-08-06 NOTE — Therapy (Signed)
OUTPATIENT SPEECH LANGUAGE PATHOLOGY TREATMENT NOTE   Patient Name: Yvonne Jordan MRN: [id number] DOB:Jun 12, 1946, 76 y.o., female Today's Date: 08/06/2022  PCP: Yvonne Contras, MD  REFERRING PROVIDER: Antony Contras, MD   END OF SESSION:   End of Session - 08/06/22 1409     Visit Number 8    Number of Visits 25    Date for SLP Re-Evaluation 09/26/22    SLP Start Time 63    SLP Stop Time  7989    SLP Time Calculation (min) 45 min    Activity Tolerance Patient tolerated treatment well             Past Medical History:  Diagnosis Date   Allergic rhinitis    Arthritis    Cancer ([place])    Breast- left   CKD (chronic kidney disease)    stage III   Colon polyp    Dyspnea    GERD (gastroesophageal reflux disease)    History of hiatal hernia    Hyperlipidemia    Personal history of radiation therapy    Stroke [facility])    TIA before 2013   TIA (transient ischemic attack)    Vitamin D deficiency    Past Surgical History:  Procedure Laterality Date   APPENDECTOMY     BREAST LUMPECTOMY Left 2006   BREAST SURGERY     2006...treated with Tamoxifen for 6 years   CHOLECYSTECTOMY     COLONOSCOPY     ESOPHAGOGASTRODUODENOSCOPY N/A 09/01/2020   Procedure: ESOPHAGOGASTRODUODENOSCOPY (EGD);  Surgeon: Yvonne Matte, MD;  Location: [place];  Service: Thoracic;  Laterality: N/A;   XI ROBOTIC ASSISTED PARAESOPHAGEAL HERNIA REPAIR N/A 09/01/2020   Procedure: XI ROBOTIC ASSISTED HIATAL HERNIA REPAIR WITH FUNDOPLICATION AND GASTROPEXY;  Surgeon: Yvonne Matte, MD;  Location: [place];  Service: Thoracic;  Laterality: N/A;   Patient Active Problem List   Diagnosis Date Noted   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   Weakness 05/08/2022   Left foot drop 05/08/2022   Dysarthria 05/08/2022   Gait abnormality 05/08/2022   S/P repair of paraesophageal hernia 09/01/2020   ILD (interstitial lung disease) ([place]) 07/21/2020   Pain in right knee 12/30/2018   Trochanteric bursitis  of right hip 12/30/2018   Low back pain 04/19/2016   Abnormality of gait 03/29/2016   Fall 03/29/2016   Dyspnea 11/10/2014   GERD (gastroesophageal reflux disease) 11/10/2014   TIA (transient ischemic attack)    Vitamin D deficiency    Allergic rhinitis    CKD (chronic kidney disease)    Colon polyp    Hyperlipidemia     ONSET DATE: 2023 dx with upper and lower motor neuron disease  REFERRING DIAG: R47.1 (ICD-10-CM) - Dysarthria and anarthria   THERAPY DIAG:  Dysarthria and anarthria  Other voice and resonance disorders  Dysphagia, oropharyngeal phase  Rationale for Evaluation and Treatment Rehabilitation  SUBJECTIVE: PT evaluation today before ST PAIN:  Are you having pain? No   OBJECTIVE:   TODAY'S TREATMENT:  08/06/22: [name] completed RMST reps with mod I. Voice volume remains WNL (68-70db) even after PT eval. Re-educated [name] on high tech eye gaze AAC device ([employer]) and low tech [employer]. Demonstrated use of low tech board and how to establish consistent eye movement vs blink for yes/no. Provided paper AEIOU board. Had her follow [employer] on Instagram, as well as CCALS and alsONE. [name] was observed to wipe her mouth with a tissue during session. Pointed this out and instructed her to  swallow her saliva more frequently. She stated she was not aware that she was wiping her mouth so often. 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 We reviewed energy conservation, resting during the day to preserve her voice and environmental strategies to maximize ease of communication in noisy environments.   07/30/22: [name] completed 5 sets of 5 reps EMST, by 4th set, fatigue noted with no air release - instructed [name] to take breaks as needed to maintain audible "bike pump" or air release. Volume remains strong and averaged 71dB with mod I over 25 minutes.  [name] continues to report improved intelligibility outside of therapy. Weight  remains stable. Continues to deny any swallowing difficulties. Education re: future possibility of TF to support nutrition/PO intake.   07/23/22: Advanced IMST to 15 (50% of MIP) and EMST to 13 (35% of MEP) with [name] rating the effort 5-6/10. Increased RMST to 3 sets of 5 twice a day, with caution to monitor for increased fatigue or worsening speech/voice to ID if this frequency needs to be reduced. [name] verbalized she would monitor this and reduce down to 2 sets of 5 as needed. Today, over 20 minute conversation, [name] did not speak on residual air and demonstrated good breath support for volume. Volume today averaged 72dB with mod I. She reports improved voice, volume and intelligibility. [name] reports she is having to repeat herself less now. Voice remained strong over 45 minute session. [name] verbalized swallow precautions and instructed her to focus on swallowing when she is out to eat or at social events to maintain airway protection in distracting environments. Introduced Patent examiner to the [employer] and options for head sets and microphones. Educated [name] on [employer] meal delivery which has options for meals with extra protein. She reports 2lb weight loss over past week.   07/18/22: MBSS completed yesterday - reviewed recommendations and initiated HEP for dysphagia of effortful swallow and mendelson. Effortful swallow with occasional min A 10/10 and Mendelson 0/8 attempts with max A. She will continue to practice this at home. Education provided on use of personal voice amplification to reduce effort of intelligible speech, especially in noisy environments. Provided handout with [name] information. Trained [name] on strategies her family should implement to maximize ease of speech and intelligibility. Including instructing them to not interrupt her or finish her message for her or talk over her. Provided information re: voice banking.  07/16/22: Initiated EMST on  the [employer] Threshold PEP at 8-9 cm H20, less than 30% MEP with rare min A. She completed 5 sets of 5 reps with mod I and denies fatigue IMT not changed, completed 5 sets of 5 reps with mod I. [name] reports she is tolerating RMST without reduced energy or fatigue. Educated how to tell the difference in EMT trainer vs IMT trainer. Niccole required verbal cues 3x to ID when she is speaking on residual air with increased vocal fry, reduced volume and increased effort to speak. Mod A to take more frequent breaths and to be aware of increased hoarseness and effort speaking as cue that she needs to breathe before continuing conversing.Initiated education of options for AAC. As her spouse is in a wheelchair and it takes effort to get groceries, encouraged her to order online groceries. MBSS tomorrow  07/11/22: MBSS scheduled for Tuesday. Halli reports she has educated her spouse on strategies he can use to support her ease of communication and reduce need for repetition. Targeted breath support for speech,  speaking few words on exhale and breathing more frequently to reduce effort of speech and speaking on residual air. At this time, Navaeh is avoiding phone calls due to reduced intelligibility. She has not called [facility] to see if she is still on the waitlist for a sooner appointment. We generated a script of what she wanted to say, including her name, DOB and questions. She demonstrated slow rate and over articulation and pausing to improve intelligibility over the phone in role play. Also instructed her to have her husband call if that is easier. She is also avoiding phone calls from extended family. We generated a strategy of letting the other person know "the doctor said I can only talk for 15 minutes" as a strategy to conserve speech/voice and energy. Trained in strategies to maximize nutrition and minimize effort of eating/swallowing. Provided handout re: maximizing nutrition in ALS. Weight stable  last week, and down 1.5 lbs this week.   07/09/22: Trained Takya in 3 environmental modifications to improve ease of communication and reduce need for repetition to conserve her voice, speech  and energy. She notes voice worsens as day progresses. Instructed her on resting her speech and voice if she has important evening events. She has not educated her family on strategies they can use to make communication easier in person and over the phone. I continue to encourage her to do this. She reports her spouse often wants to talk to her from across the house. It is imperative that Verlin instruct him to be as close as possible to reduce her effort required to be understood. See pt instructions. General energy conservation strategies including setting priorities and letting less important tasks be handled by someone else as she is able. 2 episodes of coughing on her saliva today, she is agreeable to a repeat MBSS due to h/o dysphagia and significant weight unintentional weight loss. Measured MIP: 31 cm H20 and MEP: 37 cm H20. Initiated training on IMST at 30% of MIP, 9.5. She completed this with effort rated at about 3-4/10 and occasional min A. She will start with 5 sets of 5 reps twice a day, paying attention to her energy level. Will initiate EMST next week at 30% MEP (11).   PATIENT EDUCATION: Education details: See today's treatment Person educated: Patient Education method: Explanation, Demonstration, Verbal cues, and Handouts Education comprehension: verbalized understanding, returned demonstration, verbal cues required, and needs further education     GOALS: Goals reviewed with patient? Yes   SHORT TERM GOALS: Target date: 08/01/22   Pt will demonstrate breathing ever 4-7 words to  reduce glottal fry and improve intelligibility as well as conserve energy over 8 minute conversation with rare min A Baseline: Goal status: ACHIEVED   2.  Pt will follow basic swallow precautions and verbalize  awareness of possible silent aspiration d/t h/o of reduced pharyngeal sensation on MBSS 2016.  Baseline:  Goal status: ACHIEVED   3.  Pt will complete PROM for speech/communication first 1-2 sessions Baseline:  Goal status: ACHIEVED   4.  Pt will carryover compensatory strategies for dysarthria over 8 minute conversation with rare min A Baseline:  Goal status: ACHIEVED   5.  Pt will complete RMST at 50% MIP and MEP to improve cough and maintain PO Baseline: 08/06/22;  Goal status: ACHIEVED   6.  Pt will carryover 3 environmental strategies (including family education and behavior modification) to maximize intelligibility while expanding minimal energy Baseline:  Goal status: ACHIEVED     7. Pt  will demonstrate use of multimodal communication, AAC  over 2 sessions to plan for communication with disease progression Baseline: Goal Status:ACHIEVED     LONG TERM GOALS: Target date: 09/26/22   Pt will carryover breath support and compensatory strategies for dysarthria to be 100% intelligible over 15 minute conversation at home or in community settings Baseline:  Goal status: ACHIEVED   2.  Repeat MBSS as indicated and follow swallow precautions/ diet modifications with mod I Baseline:  Goal status: ACHIEVED   3.  Complete RMST at 60% of MIP/MEP to prolong PO intake and breath support for speech Baseline:  Goal status:ONGOING   4.  Pt will carryover 2 energy conservation strategies for speech and swallowing with rare min A over 1 week Baseline:  Goal status: Partially met   5.  Pt will report 50% reduced request for repetitions, subjectively, on phone calls over 1 week Baseline:  Goal status: ONGOING   6.  Pt will improve score on PROM by 2 point Baseline:  Goal status: ONGOING   ASSESSMENT:   CLINICAL IMPRESSION: Patient is a 76 y.o. female who was seen today for concerns re: her speech and voice. With RMST at 50-60% MIP/MEP over past 2 weeks, she reports improved voice   with reduced hoarseness and good volume  Saniyya reports she is having to repeat herself less, which conserves energy. She has reduced speaking on residual breath, which has eliminated increased vocal fry and improved intelligibility.  I recommend skilled ST to maximize intelligibility and safety of swallow, focusing on maintaining current level, energy conservation, training for compensatory strategies and planning for communication options as the disease progresses. I have introduced Jil to low tech AAC AEIOU board., [name] voice amplifier, and high tech eye gaze AAC. She has been provided websites, examples and pictures of these types of AAC.   In light of pt's good progress and mod I with HEP/RMST, will decrease to 1x a week. Likely d/c next session. She is requesting f/u with ST in 2-3 months. I agree.    OBJECTIVE IMPAIRMENTS include memory, dysarthria, voice disorder, and dysphagia. These impairments are limiting patient from effectively communicating at home and in community and safety when swallowing. Factors affecting potential to achieve goals and functional outcome are medical prognosis. Patient will benefit from skilled SLP services to address above impairments and improve overall function.   REHAB POTENTIAL: Good   PLAN: SLP FREQUENCY: 2x/week   SLP DURATION: 12 weeks   PLANNED INTERVENTIONS: Aspiration precaution training, Pharyngeal strengthening exercises, Diet toleration management , Environmental controls, Trials of upgraded texture/liquids, Cueing hierachy, Cognitive reorganization, Internal/external aids, Functional tasks, Multimodal communication approach, SLP instruction and feedback, Compensatory strategies, Patient/family education, Re-evaluation, and objective swallow study (FEES/MBSS)         [place], Annye Rusk, [place] 08/06/2022, 3:05 PM

## 2022-08-06 NOTE — Therapy (Signed)
OUTPATIENT PHYSICAL THERAPY NEURO EVALUATION   Patient Name: Yvonne Jordan MRN: [id number] DOB:February 23, 1946, 76 y.o., female Today's Date: 08/06/2022   PCP: Antony Contras, MD  REFERRING PROVIDER: Antony Contras, MD    PT End of Session - 08/06/22 1316     Visit Number 1    Number of Visits 13   with eval   Date for PT Re-Evaluation 09/17/22    Authorization Type Medicare    Progress Note Due on Visit 10    PT Start Time 1315    PT Stop Time 1355    PT Time Calculation (min) 40 min    Activity Tolerance Patient tolerated treatment well    Behavior During Therapy WFL for tasks assessed/performed             Past Medical History:  Diagnosis Date   Allergic rhinitis    Arthritis    Cancer ([place])    Breast- left   CKD (chronic kidney disease)    stage III   Colon polyp    Dyspnea    GERD (gastroesophageal reflux disease)    History of hiatal hernia    Hyperlipidemia    Personal history of radiation therapy    Stroke [facility])    TIA before 2013   TIA (transient ischemic attack)    Vitamin D deficiency    Past Surgical History:  Procedure Laterality Date   APPENDECTOMY     BREAST LUMPECTOMY Left 2006   BREAST SURGERY     2006...treated with Tamoxifen for 6 years   CHOLECYSTECTOMY     COLONOSCOPY     ESOPHAGOGASTRODUODENOSCOPY N/A 09/01/2020   Procedure: ESOPHAGOGASTRODUODENOSCOPY (EGD);  Surgeon: Lajuana Matte, MD;  Location: [place];  Service: Thoracic;  Laterality: N/A;   XI ROBOTIC ASSISTED PARAESOPHAGEAL HERNIA REPAIR N/A 09/01/2020   Procedure: XI ROBOTIC ASSISTED HIATAL HERNIA REPAIR WITH FUNDOPLICATION AND GASTROPEXY;  Surgeon: Lajuana Matte, MD;  Location: [place];  Service: Thoracic;  Laterality: N/A;   Patient Active Problem List   Diagnosis Date Noted   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   Weakness 05/08/2022   Left foot drop 05/08/2022   Dysarthria 05/08/2022   Gait abnormality 05/08/2022   S/P repair of paraesophageal hernia  09/01/2020   ILD (interstitial lung disease) ([place]) 07/21/2020   Pain in right knee 12/30/2018   Trochanteric bursitis of right hip 12/30/2018   Low back pain 04/19/2016   Abnormality of gait 03/29/2016   Fall 03/29/2016   Dyspnea 11/10/2014   GERD (gastroesophageal reflux disease) 11/10/2014   TIA (transient ischemic attack)    Vitamin D deficiency    Allergic rhinitis    CKD (chronic kidney disease)    Colon polyp    Hyperlipidemia     ONSET DATE: 07/31/2022   REFERRING DIAG: G12.20 (ICD-10-CM) - Motor neuron disease, unspecified   THERAPY DIAG:  Other abnormalities of gait and mobility  Muscle weakness (generalized)  Rationale for Evaluation and Treatment Rehabilitation  SUBJECTIVE:  SUBJECTIVE STATEMENT: Pt reports gradual onset of weakness and onset of L foot drop in March. Pt has been using a L AFO since April. Pt reports she saw a neurologist but was not given many answers or education regarding her diagnosis. 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 She helps him in/out of bed with slide board transfers and drives a wheelchair van. Pt reports difficulty getting in/out of van and needs to get a step to get into the [place].  Pt accompanied by: self  PERTINENT HISTORY: HLD, Left breast Cancer, s/p left lobectomy, radiation.Chronic kidney disease,  GERD,TIA- 20 years ago (right facial numbness, dysarthria) ,Hiatal Hernia surgery  PAIN:  Are you having pain? No  PRECAUTIONS: Fall  WEIGHT BEARING RESTRICTIONS No  FALLS: Has patient fallen in last 6 months? Yes. Number of falls "lots" especially before she go the L AFO  LIVING ENVIRONMENT: Lives with: lives with their spouse Lives in: House/apartment Stairs: Yes: Internal: 12 steps; on right going  up Has following equipment at home: Walker - 4 wheeled and Ramped entry  PLOF: Independent with gait and Independent with transfers  PATIENT GOALS  "everything I have read about ALS says to work on ROM"  OBJECTIVE:   DIAGNOSTIC FINDINGS: EMG nerve conduction study May 30, 2022 showed evidence of widespread chronic neuropathic changes involving right sternocleidomastoid, genioglossus, right cervical, bilateral lumbar sacral myotomes, also evidence of active denervation at mid and lower thoracic paraspinal muscles. Above findings most supportive of motor neuron disease   COGNITION: Overall cognitive status: Within functional limits for tasks assessed   SENSATION: WFL per pt report  COORDINATION: WFL  EDEMA:  Reports some edema in distal LLE (due to brace)  POSTURE: rounded shoulders and forward head  LOWER EXTREMITY MMT:    MMT Right Eval Left Eval  Hip flexion 5 5  Hip extension    Hip abduction    Hip adduction    Hip internal rotation    Hip external rotation    Knee flexion 5 5  Knee extension 5 5  Ankle dorsiflexion 4 3  Ankle plantarflexion    Ankle inversion    Ankle eversion    (Blank rows = not tested)  BED MOBILITY:  Independent per pt report  TRANSFERS: Assistive device utilized: None  Sit to stand: Modified independence Stand to sit: Modified independence Chair to chair: Modified independence Floor:  not assessed this session  GAIT: Gait pattern:  occasional path deviation and LOB requiring cross-over stepping to recover Distance walked: various clinic distances Assistive device utilized: None Level of assistance: Modified independence Comments: with use of L AFO  FUNCTIONAL TESTs:    [facility] PT Assessment - 08/06/22 1338       Ambulation/Gait   Gait velocity 32.8' over 13.68 sec = 2.40 ft/sec      Standardized Balance Assessment   Standardized Balance Assessment Timed Up and Go Test;Five Times Sit to Stand    Five times sit to stand  comments  16.09 sec   BUE support on arms of chair, poor eccentric control when sitting     Timed Up and Go Test   TUG Normal TUG    Normal TUG (seconds) 14.03   no AD, mod I            TODAY'S TREATMENT:  PT evaluation   PATIENT EDUCATION: Education details: Eval findings, POC Person educated: Patient Education method: Explanation Education comprehension: verbalized understanding   HOME EXERCISE PROGRAM:  To be established next session   GOALS: Goals reviewed with patient? Yes  SHORT TERM GOALS: Target date: 08/27/2022  Initial HEP Baseline: not established at eval Goal status: INITIAL  2.  Pt will improve gait velocity to at least 2.8 ft/sec for improved gait efficiency and performance at mod I level  Baseline: 2.4 ft/sec (8/14) Goal status: INITIAL  3.  FGA to be assessed and goal written Baseline:  Goal status: INITIAL  4.  Pt will complete another functional outcome measure such as the ALSFRS or ABC scale and goal written Baseline:  Goal status: INITIAL   LONG TERM GOALS: Target date: 09/17/2022  Pt will be independent with strengthening and balance HEP for improved strength, balance, transfers and gait. Baseline:  Goal status: INITIAL  2.  Pt will improve gait velocity to at least 3.0 ft/sec for improved gait efficiency and performance at Independent level  Baseline: 2.4 ft/sec (8/14) Goal status: INITIAL  3.  FGA assessed and goal to be written Baseline:  Goal status: INITIAL  4.  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  Pt's PMH is significant for: HLD, Left breast Cancer, s/p left lobectomy, radiation, chronic kidney disease, GERD,TIA- 20 years ago (right facial numbness, dysarthria), and hiatal hernia  surgery. The following deficits were present during the exam: decreased gait speed (2.4 ft/sec) and minor gait and balance impairments noted. Based on these findings, fall history, and progressive nature of pt's motor neuron disease, pt is an increased risk for falls. Pt would benefit from skilled PT to address these impairments and functional limitations to maximize functional mobility independence.   OBJECTIVE IMPAIRMENTS Abnormal gait, decreased balance, difficulty walking, decreased strength, dizziness, impaired perceived functional ability, impaired sensation, impaired tone, and postural dysfunction.   ACTIVITY LIMITATIONS carrying, lifting, bending, standing, squatting, stairs, transfers, and caring for others  PARTICIPATION LIMITATIONS: meal prep, cleaning, laundry, driving, community activity, and church  PERSONAL FACTORS Age and 1-2 comorbidities:    HLD, Left breast Cancer, s/p left lobectomy, radiation, chronic kidney disease, GERD,TIA- 20 years ago (right facial numbness, dysarthria), and hiatal hernia surgery are also affecting patient's functional outcome.   REHAB POTENTIAL: Good  CLINICAL DECISION MAKING: Evolving/moderate complexity  EVALUATION COMPLEXITY: Moderate  PLAN: PT FREQUENCY: 2x/week  PT DURATION: 6 weeks  PLANNED INTERVENTIONS: Therapeutic exercises, Therapeutic activity, Neuromuscular re-education, Balance training, Gait training, Patient/Family education, Self Care, Joint mobilization, Stair training, Vestibular training, Canalith repositioning, Visual/preceptual remediation/compensation, Orthotic/Fit training, DME instructions, Aquatic Therapy, Electrical stimulation, Wheelchair mobility training, Cryotherapy, Moist heat, Taping, Manual therapy, and Re-evaluation  PLAN FOR NEXT SESSION: assess FGA, assess ALSFRS or ABC scale, assess vestibular symptoms, initiate HEP for balance, ALS edu   Excell Seltzer, PT, DPT, CSRS 08/06/2022, 1:56 PM

## 2022-08-06 NOTE — Patient Instructions (Signed)
  [email or web address]  [email or web address]  [email or web address] on instagram  Della Larsen  [employer] is a Armed forces operational officer  An [employer] is low tech where you would go line by line and indicate yes or no to each line and letter

## 2022-08-08 ENCOUNTER — Encounter: Payer: Medicare Other | Admitting: Speech Pathology

## 2022-08-09 ENCOUNTER — Telehealth: Payer: Self-pay | Admitting: Neurology

## 2022-08-09 DIAGNOSIS — R531 Weakness: Secondary | ICD-10-CM

## 2022-08-09 DIAGNOSIS — G1220 Motor neuron disease, unspecified: Secondary | ICD-10-CM

## 2022-08-09 DIAGNOSIS — R269 Unspecified abnormalities of gait and mobility: Secondary | ICD-10-CM

## 2022-08-09 NOTE — Telephone Encounter (Signed)
Orders Placed This Encounter  Procedures   Ambulatory referral to Home Health    

## 2022-08-09 NOTE — Telephone Encounter (Signed)
-----   Message from Cliffton Asters, [place] sent at 07/30/2022  1:31 PM EDT ----- Dr Krista Blue, Anne Ng is becoming more unstable on her feet. Would you please order PT and OT to eval Thanks, Georgiann Hahn SLP

## 2022-08-09 NOTE — Telephone Encounter (Signed)
Sent to [place] with [facility] to see if she will be able to take her.

## 2022-08-13 ENCOUNTER — Ambulatory Visit: Payer: Medicare Other | Admitting: Speech Pathology

## 2022-08-13 ENCOUNTER — Encounter: Payer: Self-pay | Admitting: Speech Pathology

## 2022-08-13 ENCOUNTER — Ambulatory Visit: Payer: Medicare Other | Admitting: Physical Therapy

## 2022-08-13 DIAGNOSIS — R498 Other voice and resonance disorders: Secondary | ICD-10-CM | POA: Diagnosis not present

## 2022-08-13 DIAGNOSIS — Z9181 History of falling: Secondary | ICD-10-CM | POA: Diagnosis not present

## 2022-08-13 DIAGNOSIS — R2689 Other abnormalities of gait and mobility: Secondary | ICD-10-CM | POA: Diagnosis not present

## 2022-08-13 DIAGNOSIS — R1312 Dysphagia, oropharyngeal phase: Secondary | ICD-10-CM | POA: Diagnosis not present

## 2022-08-13 DIAGNOSIS — M6281 Muscle weakness (generalized): Secondary | ICD-10-CM | POA: Diagnosis not present

## 2022-08-13 DIAGNOSIS — R471 Dysarthria and anarthria: Secondary | ICD-10-CM | POA: Diagnosis not present

## 2022-08-13 NOTE — Addendum Note (Signed)
Addended by: Verlin Grills on: 08/13/2022 08:55 AM   Modules accepted: Orders

## 2022-08-13 NOTE — Telephone Encounter (Signed)
[place] with [facility] sent me a message stating:  'Spoke with Ms. Yvonne Jordan and she is already getting outpatient ST. She would like to also do outpatient PT as well "  Please put a referral in for outpatient PT instead of home health. Thank you.

## 2022-08-13 NOTE — Therapy (Signed)
OUTPATIENT SPEECH LANGUAGE PATHOLOGY TREATMENT NOTE & Discharge Summary   Patient Name: Yvonne Jordan MRN: [id number] DOB:[birth year], 76 y.o., female Today's Date: 08/13/2022  PCP: Antony Contras, MD  REFERRING PROVIDER: Antony Contras, MD   END OF SESSION:   End of Session - 08/13/22 1351     Visit Number 9    Number of Visits 25    Date for SLP Re-Evaluation 09/26/22    SLP Start Time 1315    SLP Stop Time  1400    SLP Time Calculation (min) 45 min    Activity Tolerance Patient tolerated treatment well             Past Medical History:  Diagnosis Date   Allergic rhinitis    Arthritis    Cancer ([place])    Breast- left   CKD (chronic kidney disease)    stage III   Colon polyp    Dyspnea    GERD (gastroesophageal reflux disease)    History of hiatal hernia    Hyperlipidemia    Personal history of radiation therapy    Stroke [facility])    TIA before 2013   TIA (transient ischemic attack)    Vitamin D deficiency    Past Surgical History:  Procedure Laterality Date   APPENDECTOMY     BREAST LUMPECTOMY Left 2006   BREAST SURGERY     2006...treated with Tamoxifen for 6 years   CHOLECYSTECTOMY     COLONOSCOPY     ESOPHAGOGASTRODUODENOSCOPY N/A 09/01/2020   Procedure: ESOPHAGOGASTRODUODENOSCOPY (EGD);  Surgeon: Lajuana Matte, MD;  Location: [place];  Service: Thoracic;  Laterality: N/A;   XI ROBOTIC ASSISTED PARAESOPHAGEAL HERNIA REPAIR N/A 09/01/2020   Procedure: XI ROBOTIC ASSISTED HIATAL HERNIA REPAIR WITH FUNDOPLICATION AND GASTROPEXY;  Surgeon: Lajuana Matte, MD;  Location: [place];  Service: Thoracic;  Laterality: N/A;   Patient Active Problem List   Diagnosis Date Noted   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   Weakness 05/08/2022   Left foot drop 05/08/2022   Dysarthria 05/08/2022   Gait abnormality 05/08/2022   S/P repair of paraesophageal hernia 09/01/2020   ILD (interstitial lung disease) ([place]) 07/21/2020   Pain in right knee 12/30/2018    Trochanteric bursitis of right hip 12/30/2018   Low back pain 04/19/2016   Abnormality of gait 03/29/2016   Fall 03/29/2016   Dyspnea 11/10/2014   GERD (gastroesophageal reflux disease) 11/10/2014   TIA (transient ischemic attack)    Vitamin D deficiency    Allergic rhinitis    CKD (chronic kidney disease)    Colon polyp    Hyperlipidemia     ONSET DATE: 2023 dx with upper and lower motor neuron disease  REFERRING DIAG: R47.1 (ICD-10-CM) - Dysarthria and anarthria   THERAPY DIAG:  Dysarthria and anarthria  Other voice and resonance disorders  Dysphagia, oropharyngeal phase  Rationale for Evaluation and Treatment Rehabilitation  SUBJECTIVE: PT evaluation today before ST PAIN:  Are you having pain? No   OBJECTIVE:   TODAY'S TREATMENT:  08/13/22: Yvonne Jordan continues to complete RMST with mod I accurately. Voice varied today - she required rare min A to take adequate breath support 2x during session, remainder of session volume between 67-72dB. She verbalized 4 energy conservation strategies she can use to preserve voice and swallowing as needed.  Provided information on [name] voice amplifier - the Chatterbox and head mic - handout provided. Suggested she get the amplifier before she travels on several trips this fall. On The Commun[employer]  [place] rated a 2 or "a little difficulty" on all items except a 3, or no difficulty on saying something quickly and a 1 or quite a bit of difficulty having a long conversation. PROM total 20/30.RMST is set at 60% MIP and 50% MEP. She will continue as tolerated for 6 more weeks. Provided examples of alphabet board and basic [employer] low tech communication boards. Yvonne Jordan is aware to  get an order for ST should she experience significant functional decline before her appointment at [facility].   08/06/22: Yvonne Jordan completed RMST reps with mod I. Voice volume remains WNL (68-70db) even after PT eval. Re-educated Azaiah on  high tech eye gaze AAC device ([employer]ynavox) and low tech [employer]. Demonstrated use of low tech board and how to establish consistent eye movement vs blink for yes/no. Provided paper AEIOU board. Had her follow [employer] Dynavox on Instagram, as well as CCALS and alsONE. Licet was observed to wipe her mouth with a tissue during session. Pointed this out and instructed her to swallow her saliva more frequently. She stated she was not aware that she was wiping her mouth so often. Kajah reports several weddings and birthday parties she is travelling for. We reviewed energy conservation, resting during the day to preserve her voice and environmental strategies to maximize ease of communication in noisy environments.   07/30/22: Yvonne Jordan completed 5 sets of 5 reps EMST, by 4th set, fatigue noted with no air release - instructed Yvonne Jordan to take breaks as needed to maintain audible "bike pump" or air release. Volume remains strong and averaged 71dB with mod I over 25 minutes.  Yvonne Jordan continues to report improved intelligibility outside of therapy. Weight remains stable. Continues to deny any swallowing difficulties. Education re: future possibility of TF to support nutrition/PO intake.   07/23/22: Advanced IMST to 15 (50% of MIP) and EMST to 13 (35% of MEP) with Yvonne Jordan rating the effort 5-6/10. Increased RMST to 3 sets of 5 twice a day, with caution to monitor for increased fatigue or worsening speech/voice to ID if this frequency needs to be reduced. Yvonne Jordan verbalized she would monitor this and reduce down to 2 sets of 5 as needed. Today, over 20 minute conversation, Yvonne Jordan did not speak on residual air and demonstrated good breath support for volume. Volume today averaged 72dB with mod I. She reports improved voice, volume and intelligibility. Yvonne Jordan reports she is having to repeat herself less now. Voice remained strong over 45 minute session. Yvonne Jordan verbalized swallow precautions and instructed her to focus on  swallowing when she is out to eat or at social events to maintain airway protection in distracting environments. Introduced Patent examiner to the [employer] and options for head sets and microphones. Educated [place] on [employer] meal delivery which has options for meals with extra protein. She reports 2lb weight loss over past week.   07/18/22: MBSS completed yesterday - reviewed recommendations and initiated HEP for dysphagia of effortful swallow and mendelson. Effortful swallow with occasional min A 10/10 and Mendelson 0/8 attempts with max A. She will continue to practice this at home. Education provided on use of personal voice amplification to reduce effort of intelligible speech, especially in noisy environments. Provided handout with [name] information. Trained Jaimey on strategies her family should implement to maximize ease of speech and intelligibility. Including instructing them to not interrupt her or finish her message for her or talk over her. Provided information re: voice banking.  07/16/22: Initiated EMST on the  [employer] Threshold PEP at 8-9 cm H20, less than 30% MEP with rare min A. She completed 5 sets of 5 reps with mod I and denies fatigue IMT not changed, completed 5 sets of 5 reps with mod I. Yvonne Jordan reports she is tolerating RMST without reduced energy or fatigue. Educated how to tell the difference in EMT trainer vs IMT trainer. [place] required verbal cues 3x to ID when she is speaking on residual air with increased vocal fry, reduced volume and increased effort to speak. Mod A to take more frequent breaths and to be aware of increased hoarseness and effort speaking as cue that she needs to breathe before continuing conversing.Initiated education of options for AAC. As her spouse is in a wheelchair and it takes effort to get groceries, encouraged her to order online groceries. MBSS tomorrow  07/11/22: MBSS scheduled for Tuesday. Nick reports she has  educated her spouse on strategies he can use to support her ease of communication and reduce need for repetition. Targeted breath support for speech, speaking few words on exhale and breathing more frequently to reduce effort of speech and speaking on residual air. At this time, Sotiria is avoiding phone calls due to reduced intelligibility. She has not called [facility] to see if she is still on the waitlist for a sooner appointment. We generated a script of what she wanted to say, including her name, DOB and questions. She demonstrated slow rate and over articulation and pausing to improve intelligibility over the phone in role play. Also instructed her to have her husband call if that is easier. She is also avoiding phone calls from extended family. We generated a strategy of letting the other person know "the doctor said I can only talk for 15 minutes" as a strategy to conserve speech/voice and energy. Trained in strategies to maximize nutrition and minimize effort of eating/swallowing. Provided handout re: maximizing nutrition in ALS. Weight stable last week, and down 1.5 lbs this week.   07/09/22: Trained Yvonne Jordan in 3 environmental modifications to improve ease of communication and reduce need for repetition to conserve her voice, speech  and energy. She notes voice worsens as day progresses. Instructed her on resting her speech and voice if she has important evening events. She has not educated her family on strategies they can use to make communication easier in person and over the phone. I continue to encourage her to do this. She reports her spouse often wants to talk to her from across the house. It is imperative that Yvonne Jordan instruct him to be as close as possible to reduce her effort required to be understood. See pt instructions. General energy conservation strategies including setting priorities and letting less important tasks be handled by someone else as she is able. 2 episodes of coughing on her saliva  today, she is agreeable to a repeat MBSS due to h/o dysphagia and significant weight unintentional weight loss. Measured MIP: 31 cm H20 and MEP: 37 cm H20. Initiated training on IMST at 30% of MIP, 9.5. She completed this with effort rated at about 3-4/10 and occasional min A. She will start with 5 sets of 5 reps twice a day, paying attention to her energy level. Will initiate EMST next week at 30% MEP (11).   PATIENT EDUCATION: Education details: See today's treatment Person educated: Patient Education method: Explanation, Demonstration, Verbal cues, and Handouts Education comprehension: verbalized understanding, returned demonstration, verbal cues required, and needs further education   SPEECH THERAPY DISCHARGE SUMMARY  Visits  from Start of Care: 9  Current functional level related to goals / functional outcomes: See goals below   Remaining deficits: Voice disorder, dysarthria, dysphagia   Education / Equipment: RMST, energy conservation for speech and swallowing, AAC options, voice amplifier, swallow precautions, diet modifications   Patient agrees to discharge. Patient goals were met. Patient is being discharged due to meeting the stated rehab goals.Marland Kitchen       GOALS: Goals reviewed with patient? Yes   SHORT TERM GOALS: Target date: 08/01/22   Pt will demonstrate breathing ever 4-7 words to  reduce glottal fry and improve intelligibility as well as conserve energy over 8 minute conversation with rare min A Baseline: Goal status: ACHIEVED   2.  Pt will follow basic swallow precautions and verbalize awareness of possible silent aspiration d/t h/o of reduced pharyngeal sensation on MBSS 2016.  Baseline:  Goal status: ACHIEVED   3.  Pt will complete PROM for speech/communication first 1-2 sessions Baseline:  Goal status: ACHIEVED   4.  Pt will carryover compensatory strategies for dysarthria over 8 minute conversation with rare min A Baseline:  Goal status: ACHIEVED   5.  Pt  will complete RMST at 50% MIP and MEP to improve cough and maintain PO Baseline: 08/06/22;  Goal status: ACHIEVED   6.  Pt will carryover 3 environmental strategies (including family education and behavior modification) to maximize intelligibility while expanding minimal energy Baseline:  Goal status: ACHIEVED     7. Pt will demonstrate use of multimodal communication, AAC  over 2 sessions to plan for communication with disease progression Baseline: Goal Status:ACHIEVED     LONG TERM GOALS: Target date: 09/26/22   Pt will carryover breath support and compensatory strategies for dysarthria to be 100% intelligible over 15 minute conversation at home or in community settings Baseline:  Goal status: ACHIEVED   2.  Repeat MBSS as indicated and follow swallow precautions/ diet modifications with mod I Baseline:  Goal status: ACHIEVED   3.  Complete RMST at 60% of MIP/MEP to prolong PO intake and breath support for speech Baseline: ONGOING Goal Status: Partially met   4.  Pt will carryover 2 energy conservation strategies for speech and swallowing with rare min A over 1 week Baseline:  Goal status: ACHIEVED   5.  Pt will report 50% reduced request for repetitions, subjectively, on phone calls over 1 week Baseline:  Goal status: Achieved   6.  Pt will improve score on PROM by 2 point Baseline:  Goal status: Deferred - initial PROM not given   ASSESSMENT:   CLINICAL IMPRESSION: Patient is a 76 y.o. female who was seen today for concerns re: her speech and voice. With RMST at 50-60% MIP/MEP over past 2 weeks, she reports improved voice  with reduced hoarseness and good volume  Yvonne Jordan reports she is having to repeat herself less, which conserves energy. She has reduced speaking on residual breath, which has eliminated increased vocal fry and improved intelligibility.  I recommend skilled ST to maximize intelligibility and safety of swallow, focusing on maintaining current level, energy  conservation, training for compensatory strategies and planning for communication options as the disease progresses. She verbalizes energy conservation strategies for speech and swallowing and is monitoring her weight.I have introduced Yvonne Jordan to low tech AAC AEIOU board., Basic [employer], alphabet board, [name] voice amplifier, and high tech eye gaze AAC. She has been provided websites, examples and pictures of these types of AAC.   In light of  pt's good progress and mod I with HEP/RMST. Goals met, d/c ST at this time.  Pt in agreement. Should she experience functional decline in speech or swallowing before she is seen by [place] [facility], please order ST here.   OBJECTIVE IMPAIRMENTS include memory, dysarthria, voice disorder, and dysphagia. These impairments are limiting patient from effectively communicating at home and in community and safety when swallowing. Factors affecting potential to achieve goals and functional outcome are medical prognosis. Patient will benefit from skilled SLP services to address above impairments and improve overall function.   REHAB POTENTIAL: Good   PLAN: SLP FREQUENCY: 2x/week   SLP DURATION: 12 weeks   PLANNED INTERVENTIONS: Aspiration precaution training, Pharyngeal strengthening exercises, Diet toleration management , Environmental controls, Trials of upgraded texture/liquids, Cueing hierachy, Cognitive reorganization, Internal/external aids, Functional tasks, Multimodal communication approach, SLP instruction and feedback, Compensatory strategies, Patient/family education, Re-evaluation, and objective swallow study (FEES/MBSS)         [place], Annye Rusk, [place] 08/13/2022, 2:18 PM

## 2022-08-13 NOTE — Telephone Encounter (Signed)
Physical therapy order has been placed.

## 2022-08-13 NOTE — Therapy (Signed)
OUTPATIENT PHYSICAL THERAPY NEURO TREATMENT   Patient Name: Yvonne Jordan MRN: [id number] DOB:Apr 23, 1946, 76 y.o., female Today's Date: 08/13/2022   PCP: Antony Contras, MD  REFERRING PROVIDER: Antony Contras, MD    PT End of Session - 08/13/22 1406     Visit Number 2    Number of Visits 13   with eval   Date for PT Re-Evaluation 09/17/22    Authorization Type Medicare    Progress Note Due on Visit 10    PT Start Time 1405   Handoff w/SLP   PT Stop Time 8657    PT Time Calculation (min) 40 min    Equipment Utilized During Treatment Other (comment)   L AFO   Activity Tolerance Patient tolerated treatment well    Behavior During Therapy WFL for tasks assessed/performed              Past Medical History:  Diagnosis Date   Allergic rhinitis    Arthritis    Cancer ([place])    Breast- left   CKD (chronic kidney disease)    stage III   Colon polyp    Dyspnea    GERD (gastroesophageal reflux disease)    History of hiatal hernia    Hyperlipidemia    Personal history of radiation therapy    Stroke [facility])    TIA before 2013   TIA (transient ischemic attack)    Vitamin D deficiency    Past Surgical History:  Procedure Laterality Date   APPENDECTOMY     BREAST LUMPECTOMY Left 2006   BREAST SURGERY     2006...treated with Tamoxifen for 6 years   CHOLECYSTECTOMY     COLONOSCOPY     ESOPHAGOGASTRODUODENOSCOPY N/A 09/01/2020   Procedure: ESOPHAGOGASTRODUODENOSCOPY (EGD);  Surgeon: Lajuana Matte, MD;  Location: [place];  Service: Thoracic;  Laterality: N/A;   XI ROBOTIC ASSISTED PARAESOPHAGEAL HERNIA REPAIR N/A 09/01/2020   Procedure: XI ROBOTIC ASSISTED HIATAL HERNIA REPAIR WITH FUNDOPLICATION AND GASTROPEXY;  Surgeon: Lajuana Matte, MD;  Location: [place];  Service: Thoracic;  Laterality: N/A;   Patient Active Problem List   Diagnosis Date Noted   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   Weakness 05/08/2022   Left foot drop 05/08/2022   Dysarthria 05/08/2022    Gait abnormality 05/08/2022   S/P repair of paraesophageal hernia 09/01/2020   ILD (interstitial lung disease) ([place]) 07/21/2020   Pain in right knee 12/30/2018   Trochanteric bursitis of right hip 12/30/2018   Low back pain 04/19/2016   Abnormality of gait 03/29/2016   Fall 03/29/2016   Dyspnea 11/10/2014   GERD (gastroesophageal reflux disease) 11/10/2014   TIA (transient ischemic attack)    Vitamin D deficiency    Allergic rhinitis    CKD (chronic kidney disease)    Colon polyp    Hyperlipidemia     ONSET DATE: 07/31/2022   REFERRING DIAG: G12.20 (ICD-10-CM) - Motor neuron disease, unspecified   THERAPY DIAG:  Muscle weakness (generalized)  Other abnormalities of gait and mobility  History of falling  Rationale for Evaluation and Treatment Rehabilitation  SUBJECTIVE:  SUBJECTIVE STATEMENT: Pt reports she continues to have a lot of difficulty w/walking but has not fallen since last visit. Has not obtained a step for her Lucianne Lei yet. No new changes.   Pt accompanied by: self  PERTINENT HISTORY: HLD, Left breast Cancer, s/p left lobectomy, radiation.Chronic kidney disease,  GERD,TIA- 20 years ago (right facial numbness, dysarthria) ,Hiatal Hernia surgery  PAIN:  Are you having pain? No  PRECAUTIONS: Fall  FALLS: Has patient fallen in last 6 months? Yes. Number of falls "lots" especially before she go the L AFO   PLOF: Independent with gait and Independent with transfers  PATIENT GOALS  "everything I have read about ALS says to work on ROM"  OBJECTIVE:   DIAGNOSTIC FINDINGS: EMG nerve conduction study May 30, 2022 showed evidence of widespread chronic neuropathic changes involving right sternocleidomastoid, genioglossus, right cervical, bilateral lumbar sacral myotomes, also  evidence of active denervation at mid and lower thoracic paraspinal muscles. Above findings most supportive of motor neuron disease    TODAY'S TREATMENT:  Therapeutic Activity  Completed the Amyotrophic Lateral Sclerosis Rating Scale (ALSFRS): 32/40  Entirety of session spent discussing PT's role in ALS and importance of energy conservation for maintenance of functional mobility and reduction of over-exertion. Pt ambulated into clinic without AD and w/L AFO donned. PT recommending she use rollator at all times to make gait more energy efficient and safe, as pt states she is very unsteady w/gait.  Provided handout from the [employer] outlining exercise types and benefits of light exercise w/emphasis on preventing fatigue.  Pt reports she has been accepted in [facility]'s ALS program and starts on 10/29/22.  Pt inquiring about aquatic therapy, as she used to workout in the pool frequently and thoroughly enjoys it. Changed her appointment from land to aquatic next Monday for pt to trial and emphasized importance of not over-doing exercise. Pt verbalized understanding. Will determine if pt would like to continue w/aquatics 1x/week following session next Monday.    GAIT: Gait pattern: narrow BOS, frequent furniture walking (bracing against wall) and cross-over stepping Assistive device utilized: None Level of assistance: SBA Comments: with use of L AFO   PATIENT EDUCATION: Education details: See above  Person educated: Patient Education method: Theatre stage manager Education comprehension: verbalized understanding   HOME EXERCISE PROGRAM: To be established next session   GOALS: Goals reviewed with patient? Yes  SHORT TERM GOALS: Target date: 08/27/2022  Initial HEP Baseline: not established at eval Goal status: INITIAL  2.  Pt will improve gait velocity to at least 2.8 ft/sec for improved gait efficiency and performance at mod I level  Baseline: 2.4 ft/sec (8/14) Goal  status: INITIAL  3.  FGA to be assessed and goal written Baseline:  Goal status: INITIAL  4.  Pt will complete another functional outcome measure such as the ALSFRS or ABC scale and goal written Baseline: 32/40 on ALSFRS on 8/21 and STG written  Goal status: MET   LONG TERM GOALS: Target date: 09/17/2022  Pt will be independent with strengthening and balance HEP for improved strength, balance, transfers and gait. Baseline:  Goal status: INITIAL  2.  Pt will improve gait velocity to at least 3.0 ft/sec for improved gait efficiency and performance at Independent level  Baseline: 2.4 ft/sec (8/14) Goal status: INITIAL  3.  FGA assessed and goal to be written Baseline:  Goal status: INITIAL  4.  Pt will obtain >/= 30/40 on ALSFRS for maintained functional mobility and independence Baseline: 32/40  on 8/21 Goal status: REVISED   ASSESSMENT:  CLINICAL IMPRESSION: Emphasis of skilled PT session on pt education regarding exercise w/ALS. Pt interested in aquatic therapy, so scheduled pt in pool therapy next week to trial. Pt to start ALS program at [facility] on 11/6 and is excited. Pt agreeable to use rollator at all times for energy conservation. Continue POC.    OBJECTIVE IMPAIRMENTS Abnormal gait, decreased balance, difficulty walking, decreased strength, dizziness, impaired perceived functional ability, impaired sensation, impaired tone, and postural dysfunction.   ACTIVITY LIMITATIONS carrying, lifting, bending, standing, squatting, stairs, transfers, and caring for others  PARTICIPATION LIMITATIONS: meal prep, cleaning, laundry, driving, community activity, and church  PERSONAL FACTORS Age and 1-2 comorbidities:    HLD, Left breast Cancer, s/p left lobectomy, radiation, chronic kidney disease, GERD,TIA- 20 years ago (right facial numbness, dysarthria), and hiatal hernia surgery are also affecting patient's functional outcome.   REHAB POTENTIAL: Good  CLINICAL DECISION MAKING:  Evolving/moderate complexity  EVALUATION COMPLEXITY: Moderate  PLAN: PT FREQUENCY: 2x/week  PT DURATION: 6 weeks  PLANNED INTERVENTIONS: Therapeutic exercises, Therapeutic activity, Neuromuscular re-education, Balance training, Gait training, Patient/Family education, Self Care, Joint mobilization, Stair training, Vestibular training, Canalith repositioning, Visual/preceptual remediation/compensation, Orthotic/Fit training, DME instructions, Aquatic Therapy, Electrical stimulation, Wheelchair mobility training, Cryotherapy, Moist heat, Taping, Manual therapy, and Re-evaluation  PLAN FOR NEXT SESSION: Provide handout for aquatic therapy (.aquatictherapy) assess FGA, assess vestibular symptoms, initiate HEP for balance, ALS edu   Sylvanus Telford E Avriana Joo, PT, DPT 08/13/2022, 2:49 PM

## 2022-08-15 ENCOUNTER — Encounter: Payer: Medicare Other | Admitting: Speech Pathology

## 2022-08-15 ENCOUNTER — Ambulatory Visit: Payer: Medicare Other | Admitting: Physical Therapy

## 2022-08-20 ENCOUNTER — Ambulatory Visit: Payer: Medicare Other | Admitting: Physical Therapy

## 2022-08-20 DIAGNOSIS — R471 Dysarthria and anarthria: Secondary | ICD-10-CM | POA: Diagnosis not present

## 2022-08-20 DIAGNOSIS — Z9181 History of falling: Secondary | ICD-10-CM

## 2022-08-20 DIAGNOSIS — R2689 Other abnormalities of gait and mobility: Secondary | ICD-10-CM | POA: Diagnosis not present

## 2022-08-20 DIAGNOSIS — R1312 Dysphagia, oropharyngeal phase: Secondary | ICD-10-CM | POA: Diagnosis not present

## 2022-08-20 DIAGNOSIS — M6281 Muscle weakness (generalized): Secondary | ICD-10-CM | POA: Diagnosis not present

## 2022-08-20 DIAGNOSIS — R498 Other voice and resonance disorders: Secondary | ICD-10-CM | POA: Diagnosis not present

## 2022-08-21 ENCOUNTER — Encounter: Payer: Self-pay | Admitting: Physical Therapy

## 2022-08-21 ENCOUNTER — Ambulatory Visit (INDEPENDENT_AMBULATORY_CARE_PROVIDER_SITE_OTHER): Payer: Medicare Other | Admitting: Podiatry

## 2022-08-21 DIAGNOSIS — D2372 Other benign neoplasm of skin of left lower limb, including hip: Secondary | ICD-10-CM | POA: Diagnosis not present

## 2022-08-21 NOTE — Therapy (Addendum)
OUTPATIENT PHYSICAL THERAPY NEURO TREATMENT   Patient Name: Yvonne Jordan MRN: [id number] DOB:1946-04-29, 76 y.o., female Today's Date: 08/21/2022   PCP: Antony Contras, MD  REFERRING PROVIDER: Antony Contras, MD    PT End of Session - 08/20/22 1546     Visit Number 3    Number of Visits 13   with eval   Date for PT Re-Evaluation 09/17/22    Authorization Type Medicare    Progress Note Due on Visit 10    PT Start Time 4818   started 2pm appt early   PT Stop Time 1420    PT Time Calculation (min) 40 min    Equipment Utilized During Treatment Other (comment)   pool noodles, single bar bells.   Activity Tolerance Patient tolerated treatment well    Behavior During Therapy WFL for tasks assessed/performed              Past Medical History:  Diagnosis Date   Allergic rhinitis    Arthritis    Cancer ([place])    Breast- left   CKD (chronic kidney disease)    stage III   Colon polyp    Dyspnea    GERD (gastroesophageal reflux disease)    History of hiatal hernia    Hyperlipidemia    Personal history of radiation therapy    Stroke [facility])    TIA before 2013   TIA (transient ischemic attack)    Vitamin D deficiency    Past Surgical History:  Procedure Laterality Date   APPENDECTOMY     BREAST LUMPECTOMY Left 2006   BREAST SURGERY     2006...treated with Tamoxifen for 6 years   CHOLECYSTECTOMY     COLONOSCOPY     ESOPHAGOGASTRODUODENOSCOPY N/A 09/01/2020   Procedure: ESOPHAGOGASTRODUODENOSCOPY (EGD);  Surgeon: Lajuana Matte, MD;  Location: [place];  Service: Thoracic;  Laterality: N/A;   XI ROBOTIC ASSISTED PARAESOPHAGEAL HERNIA REPAIR N/A 09/01/2020   Procedure: XI ROBOTIC ASSISTED HIATAL HERNIA REPAIR WITH FUNDOPLICATION AND GASTROPEXY;  Surgeon: Lajuana Matte, MD;  Location: [place];  Service: Thoracic;  Laterality: N/A;   Patient Active Problem List   Diagnosis Date Noted   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   Weakness 05/08/2022   Left foot drop  05/08/2022   Dysarthria 05/08/2022   Gait abnormality 05/08/2022   S/P repair of paraesophageal hernia 09/01/2020   ILD (interstitial lung disease) ([place]) 07/21/2020   Pain in right knee 12/30/2018   Trochanteric bursitis of right hip 12/30/2018   Low back pain 04/19/2016   Abnormality of gait 03/29/2016   Fall 03/29/2016   Dyspnea 11/10/2014   GERD (gastroesophageal reflux disease) 11/10/2014   TIA (transient ischemic attack)    Vitamin D deficiency    Allergic rhinitis    CKD (chronic kidney disease)    Colon polyp    Hyperlipidemia     ONSET DATE: 07/31/2022   REFERRING DIAG: G12.20 (ICD-10-CM) - Motor neuron disease, unspecified   THERAPY DIAG:  Muscle weakness (generalized)  Other abnormalities of gait and mobility  History of falling  Rationale for Evaluation and Treatment Rehabilitation   PERTINENT HISTORY: HLD, Left breast Cancer, s/p left lobectomy, radiation.Chronic kidney disease,  GERD,TIA- 20 years ago (right facial numbness, dysarthria) ,Hiatal Hernia surgery   PRECAUTIONS: Fall  PATIENT GOALS:"everything I have read about ALS says to work on ROM"   SUBJECTIVE STATEMENT: No new complaints. Presents for 1st aquatic session. No new falls to report. No pain.   Pt accompanied by:  self  PAIN:  Are you having pain? No   OBJECTIVE:   DIAGNOSTIC FINDINGS: EMG nerve conduction study May 30, 2022 showed evidence of widespread chronic neuropathic changes involving right sternocleidomastoid, genioglossus, right cervical, bilateral lumbar sacral myotomes, also evidence of active denervation at mid and lower thoracic paraspinal muscles. Above findings most supportive of motor neuron disease    TODAY'S TREATMENT: Aquatic therapy at [facility] - pool temp 90 degrees  Patient seen for aquatic therapy today.  Treatment took place in water 3.5-4.5 feet deep depending upon activity.  Pt entered/exited pool via stairs with bil rails. Step to pattern with CGA for  safety.    With yellow noodle in ~4.0-4.3 water depth Forward gait for 18 feet across pool x 8 laps Backward gait for 18 feet across pool x 8 laps Side stepping left<>right for 18 feet across pool x 4 laps each way  Seated on bench in water with single bar bells: performed the following for 10 reps each with cues on form/technique. Alternating punching Shoulder horizontal abd/add Holding arms straight out in front for alternating lowering arms down under water/back to water surface Holding arms straight out at sides for alternating lowering arms down under water/back to water surface     Pt requires buoyancy of water for support for reduced fall risk with gait training and balance exercises with minimal UE support; exercises able to be performed safely in water without the risk of fall compared to those same exercises performed on land;  viscosity of water needed for resistance for strengthening.  Current of water provides perturbations for challenging static & dynamic standing balance.      PATIENT EDUCATION: Education details: See above  Person educated: Patient Education method: Theatre stage manager Education comprehension: verbalized understanding   HOME EXERCISE PROGRAM: To be established next session   GOALS: Goals reviewed with patient? Yes  SHORT TERM GOALS: Target date: 08/27/2022  Initial HEP Baseline: not established at eval Goal status: INITIAL  2.  Pt will improve gait velocity to at least 2.8 ft/sec for improved gait efficiency and performance at mod I level  Baseline: 2.4 ft/sec (8/14) Goal status: INITIAL  3.  FGA to be assessed and goal written Baseline:  Goal status: INITIAL  4.  Pt will complete another functional outcome measure such as the ALSFRS or ABC scale and goal written Baseline: 32/40 on ALSFRS on 8/21 and STG written  Goal status: MET   LONG TERM GOALS: Target date: 09/17/2022  Pt will be independent with strengthening and balance  HEP for improved strength, balance, transfers and gait. Baseline:  Goal status: INITIAL  2.  Pt will improve gait velocity to at least 3.0 ft/sec for improved gait efficiency and performance at Independent level  Baseline: 2.4 ft/sec (8/14) Goal status: INITIAL  3.  FGA assessed and goal to be written Baseline:  Goal status: INITIAL  4.  Pt will obtain >/= 30/40 on ALSFRS for maintained functional mobility and independence Baseline: 32/40 on 8/21 Goal status: REVISED   ASSESSMENT:  CLINICAL IMPRESSION: Today's skilled session focused on gait and gentle strengthening/ROM in the aquatic setting. No issues noted or reported in session.    OBJECTIVE IMPAIRMENTS Abnormal gait, decreased balance, difficulty walking, decreased strength, dizziness, impaired perceived functional ability, impaired sensation, impaired tone, and postural dysfunction.   ACTIVITY LIMITATIONS carrying, lifting, bending, standing, squatting, stairs, transfers, and caring for others  PARTICIPATION LIMITATIONS: meal prep, cleaning, laundry, driving, community activity, and church  PERSONAL FACTORS Age and  1-2 comorbidities:    HLD, Left breast Cancer, s/p left lobectomy, radiation, chronic kidney disease, GERD,TIA- 20 years ago (right facial numbness, dysarthria), and hiatal hernia surgery are also affecting patient's functional outcome.   REHAB POTENTIAL: Good  CLINICAL DECISION MAKING: Evolving/moderate complexity  EVALUATION COMPLEXITY: Moderate  PLAN: PT FREQUENCY: 2x/week  PT DURATION: 6 weeks  PLANNED INTERVENTIONS: Therapeutic exercises, Therapeutic activity, Neuromuscular re-education, Balance training, Gait training, Patient/Family education, Self Care, Joint mobilization, Stair training, Vestibular training, Canalith repositioning, Visual/preceptual remediation/compensation, Orthotic/Fit training, DME instructions, Aquatic Therapy, Electrical stimulation, Wheelchair mobility training, Cryotherapy,  Moist heat, Taping, Manual therapy, and Re-evaluation  PLAN FOR NEXT SESSION:   assess FGA, assess vestibular symptoms, initiate HEP for balance, ALS education   Willow Ora, [place], [place] [address], [place], [place] [address] [phone] 08/21/22, 3:50 PM

## 2022-08-21 NOTE — Progress Notes (Signed)
   Chief Complaint  Patient presents with   Callouses    Patient is here for bilateral callouses on feet.    HPI: 76 y.o. female presenting today for evaluation of symptomatic calluses that have developed to the left foot secondary to foot drop deformity.  Patient wears an AFO brace daily.  She is requesting to have them debrided today.  She presents for further treatment and evaluation  Past Medical History:  Diagnosis Date   Allergic rhinitis    Arthritis    Cancer ([place])    Breast- left   CKD (chronic kidney disease)    stage III   Colon polyp    Dyspnea    GERD (gastroesophageal reflux disease)    History of hiatal hernia    Hyperlipidemia    Personal history of radiation therapy    Stroke [facility])    TIA before 2013   TIA (transient ischemic attack)    Vitamin D deficiency     Past Surgical History:  Procedure Laterality Date   APPENDECTOMY     BREAST LUMPECTOMY Left 2006   BREAST SURGERY     2006...treated with Tamoxifen for 6 years   CHOLECYSTECTOMY     COLONOSCOPY     ESOPHAGOGASTRODUODENOSCOPY N/A 09/01/2020   Procedure: ESOPHAGOGASTRODUODENOSCOPY (EGD);  Surgeon: Lajuana Matte, MD;  Location: [place];  Service: Thoracic;  Laterality: N/A;   XI ROBOTIC ASSISTED PARAESOPHAGEAL HERNIA REPAIR N/A 09/01/2020   Procedure: XI ROBOTIC ASSISTED HIATAL HERNIA REPAIR WITH FUNDOPLICATION AND GASTROPEXY;  Surgeon: Lajuana Matte, MD;  Location: [place];  Service: Thoracic;  Laterality: N/A;    No Known Allergies   Physical Exam: General: The patient is alert and oriented x3 in no acute distress.  Dermatology: Hyperkeratotic calluses noted to the plantar aspect of the left forefoot  Vascular: Palpable pedal pulses bilaterally. Capillary refill within normal limits.  Negative for any significant edema or erythema  Neurological: Light touch and protective threshold grossly intact  Musculoskeletal Exam: No pedal deformities noted.  Negative for any significant pain to  the foot or ankle.  Loss of ankle joint dorsiflexion and eversion noted consistent with dropfoot deformity.    Assessment: 1.  Dropfoot left lower extremity 2.  Symptomatic calluses left foot  Plan of Care:  1. Patient evaluated.  2.  Continue AFO bracing 3.  Excisional debridement of the hyperkeratotic skin lesions was performed today using a 312 scalpel without incident or bleeding.  This was performed as a courtesy to the patient.  Patient did feel some relief 4.  Continue wearing good supportive shoes and sneakers 5.  Return to clinic as needed      Edrick Kins, DPM [facility]  Dr. Edrick Kins, DPM    [address], [place] [address]                Office [phone]  Fax [phone]

## 2022-08-22 ENCOUNTER — Ambulatory Visit: Payer: Medicare Other | Admitting: Physical Therapy

## 2022-08-22 DIAGNOSIS — Z9181 History of falling: Secondary | ICD-10-CM

## 2022-08-22 DIAGNOSIS — R2689 Other abnormalities of gait and mobility: Secondary | ICD-10-CM | POA: Diagnosis not present

## 2022-08-22 DIAGNOSIS — R498 Other voice and resonance disorders: Secondary | ICD-10-CM | POA: Diagnosis not present

## 2022-08-22 DIAGNOSIS — M6281 Muscle weakness (generalized): Secondary | ICD-10-CM | POA: Diagnosis not present

## 2022-08-22 DIAGNOSIS — R471 Dysarthria and anarthria: Secondary | ICD-10-CM | POA: Diagnosis not present

## 2022-08-22 DIAGNOSIS — R1312 Dysphagia, oropharyngeal phase: Secondary | ICD-10-CM | POA: Diagnosis not present

## 2022-08-22 NOTE — Therapy (Signed)
OUTPATIENT PHYSICAL THERAPY NEURO TREATMENT   Patient Name: Yvonne Jordan MRN: [id number] DOB:12-Jan-1946, 76 y.o., female Today's Date: 08/22/2022   PCP: Antony Contras, MD  REFERRING PROVIDER: Antony Contras, MD    PT End of Session - 08/22/22 1406     Visit Number 4    Number of Visits 13   with eval   Date for PT Re-Evaluation 09/17/22    Authorization Type Medicare    Progress Note Due on Visit 10    PT Start Time 1400    PT Stop Time 1440    PT Time Calculation (min) 40 min    Equipment Utilized During Treatment Other (comment)   pool noodles, single bar bells.   Activity Tolerance Patient tolerated treatment well    Behavior During Therapy WFL for tasks assessed/performed               Past Medical History:  Diagnosis Date   Allergic rhinitis    Arthritis    Cancer ([place])    Breast- left   CKD (chronic kidney disease)    stage III   Colon polyp    Dyspnea    GERD (gastroesophageal reflux disease)    History of hiatal hernia    Hyperlipidemia    Personal history of radiation therapy    Stroke [facility])    TIA before 2013   TIA (transient ischemic attack)    Vitamin D deficiency    Past Surgical History:  Procedure Laterality Date   APPENDECTOMY     BREAST LUMPECTOMY Left 2006   BREAST SURGERY     2006...treated with Tamoxifen for 6 years   CHOLECYSTECTOMY     COLONOSCOPY     ESOPHAGOGASTRODUODENOSCOPY N/A 09/01/2020   Procedure: ESOPHAGOGASTRODUODENOSCOPY (EGD);  Surgeon: Lajuana Matte, MD;  Location: [place];  Service: Thoracic;  Laterality: N/A;   XI ROBOTIC ASSISTED PARAESOPHAGEAL HERNIA REPAIR N/A 09/01/2020   Procedure: XI ROBOTIC ASSISTED HIATAL HERNIA REPAIR WITH FUNDOPLICATION AND GASTROPEXY;  Surgeon: Lajuana Matte, MD;  Location: [place];  Service: Thoracic;  Laterality: N/A;   Patient Active Problem List   Diagnosis Date Noted   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   Weakness 05/08/2022   Left foot drop 05/08/2022    Dysarthria 05/08/2022   Gait abnormality 05/08/2022   S/P repair of paraesophageal hernia 09/01/2020   ILD (interstitial lung disease) ([place]) 07/21/2020   Pain in right knee 12/30/2018   Trochanteric bursitis of right hip 12/30/2018   Low back pain 04/19/2016   Abnormality of gait 03/29/2016   Fall 03/29/2016   Dyspnea 11/10/2014   GERD (gastroesophageal reflux disease) 11/10/2014   TIA (transient ischemic attack)    Vitamin D deficiency    Allergic rhinitis    CKD (chronic kidney disease)    Colon polyp    Hyperlipidemia     ONSET DATE: 07/31/2022   REFERRING DIAG: G12.20 (ICD-10-CM) - Motor neuron disease, unspecified   THERAPY DIAG:  Muscle weakness (generalized)  Other abnormalities of gait and mobility  History of falling  Rationale for Evaluation and Treatment Rehabilitation   PERTINENT HISTORY: HLD, Left breast Cancer, s/p left lobectomy, radiation.Chronic kidney disease,  GERD,TIA- 20 years ago (right facial numbness, dysarthria) ,Hiatal Hernia surgery   PRECAUTIONS: Fall  PATIENT GOALS:"everything I have read about ALS says to work on ROM"   SUBJECTIVE STATEMENT: Pt reports that she loved pool therapy, wants to continue in the pool and on land. Felt sore after pool therapy but was excited  by being able to get into the pool.   Pt accompanied by: self  PAIN:  Are you having pain? No   OBJECTIVE:   DIAGNOSTIC FINDINGS: EMG nerve conduction study May 30, 2022 showed evidence of widespread chronic neuropathic changes involving right sternocleidomastoid, genioglossus, right cervical, bilateral lumbar sacral myotomes, also evidence of active denervation at mid and lower thoracic paraspinal muscles. Above findings most supportive of motor neuron disease    TODAY'S TREATMENT:  THER EX: Initiated home stretching program, see bolded below  Discussed importance of not over-doing it regarding exercise with ALS diagnosis. ***    PATIENT EDUCATION: Education  details: initiated HEP Person educated: Patient Education method: Theatre stage manager Education comprehension: verbalized understanding   HOME EXERCISE PROGRAM: Access Code: [email or web address] URL: [email or web address] Date: 08/22/2022 Prepared by: Excell Seltzer  Exercises - Supine Single Knee to Chest Stretch  - 1-2 x daily - 7 x weekly - 2 sets - 5 reps - 30-60 sec hold - Supine Figure 4 Piriformis Stretch  - 1-2 x daily - 7 x weekly - 2 sets - 5 reps - 30-60 sec hold - Long Sitting Calf Stretch with Strap  - 1-2 x daily - 7 x weekly - 2 sets - 5 reps - 30-60 sec hold - Supine Butterfly Groin Stretch  - 1-2 x daily - 7 x weekly - 2 sets - 5 reps - 30-60 sec hold   GOALS: Goals reviewed with patient? Yes  SHORT TERM GOALS: Target date: 08/27/2022  Initial HEP Baseline: not established at eval Goal status: INITIAL  2.  Pt will improve gait velocity to at least 2.8 ft/sec for improved gait efficiency and performance at mod I level  Baseline: 2.4 ft/sec (8/14) Goal status: INITIAL  3.  FGA to be assessed and goal written Baseline:  Goal status: INITIAL  4.  Pt will complete another functional outcome measure such as the ALSFRS or ABC scale and goal written Baseline: 32/40 on ALSFRS on 8/21 and STG written  Goal status: MET   LONG TERM GOALS: Target date: 09/17/2022  Pt will be independent with strengthening and balance HEP for improved strength, balance, transfers and gait. Baseline:  Goal status: INITIAL  2.  Pt will improve gait velocity to at least 3.0 ft/sec for improved gait efficiency and performance at Independent level  Baseline: 2.4 ft/sec (8/14) Goal status: INITIAL  3.  FGA assessed and goal to be written Baseline:  Goal status: INITIAL  4.  Pt will obtain >/= 30/40 on ALSFRS for maintained functional mobility and independence Baseline: 32/40 on 8/21 Goal status: REVISED   ASSESSMENT:  CLINICAL IMPRESSION: Emphasis of skilled PT  session***on initiating home stretching HEP with patient, handout provided. See bolded above. 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    OBJECTIVE IMPAIRMENTS Abnormal gait, decreased balance, difficulty walking, decreased strength, dizziness, impaired perceived functional ability, impaired sensation, impaired tone, and postural dysfunction.   ACTIVITY LIMITATIONS carrying, lifting, bending, standing, squatting, stairs, transfers, and caring for others  PARTICIPATION LIMITATIONS: meal prep, cleaning, laundry, driving, community activity, and church  PERSONAL FACTORS Age and 1-2 comorbidities:    HLD, Left breast Cancer, s/p left lobectomy, radiation, chronic kidney disease, GERD,TIA- 20 years ago (right facial numbness, dysarthria), and hiatal hernia surgery are also affecting patient's functional  outcome.   REHAB POTENTIAL: Good  CLINICAL DECISION MAKING: Evolving/moderate complexity  EVALUATION COMPLEXITY: Moderate  PLAN: PT FREQUENCY: 2x/week  PT DURATION: 6 weeks  PLANNED INTERVENTIONS: Therapeutic exercises, Therapeutic activity, Neuromuscular re-education, Balance training, Gait training, Patient/Family education, Self Care, Joint mobilization, Stair training, Vestibular training, Canalith repositioning, Visual/preceptual remediation/compensation, Orthotic/Fit training, DME instructions, Aquatic Therapy, Electrical stimulation, Wheelchair mobility training, Cryotherapy, Moist heat, Taping, Manual therapy, and Re-evaluation  PLAN FOR NEXT SESSION:   assess FGA, assess vestibular symptoms, assess HEP and add to as needed, ALS education, energy conservation education, balance training   Excell Seltzer, PT, DPT, [place] [address] [place], [place] [address] [phone] 08/22/22, 2:41 PM

## 2022-09-13 ENCOUNTER — Ambulatory Visit: Payer: Medicare Other | Attending: Family Medicine | Admitting: Occupational Therapy

## 2022-09-13 ENCOUNTER — Encounter: Payer: Self-pay | Admitting: Occupational Therapy

## 2022-09-13 ENCOUNTER — Ambulatory Visit: Payer: Medicare Other | Admitting: Physical Therapy

## 2022-09-13 DIAGNOSIS — Z9181 History of falling: Secondary | ICD-10-CM

## 2022-09-13 DIAGNOSIS — M6281 Muscle weakness (generalized): Secondary | ICD-10-CM | POA: Insufficient documentation

## 2022-09-13 DIAGNOSIS — R2689 Other abnormalities of gait and mobility: Secondary | ICD-10-CM | POA: Insufficient documentation
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
## 2022-09-13 NOTE — Therapy (Signed)
OUTPATIENT OCCUPATIONAL THERAPY NEURO EVALUATION  Patient Name: Yvonne Jordan MRN: [id number] DOB:1946/07/03, 76 y.o., female Today's Date: 09/13/2022  PCP: Antony Contras, MD  REFERRING PROVIDER:  Antony Contras, MD    OT End of Session - 09/13/22 1333     Visit Number 1    Number of Visits 1   Date for OT Re-Evaluation 11/13/22    Authorization Type Medicare    OT Start Time 1235    OT Stop Time 1320    OT Time Calculation (min) 45 min    Behavior During Therapy [facility] for tasks assessed/performed             Past Medical History:  Diagnosis Date   Allergic rhinitis    Arthritis    Cancer ([place])    Breast- left   CKD (chronic kidney disease)    stage III   Colon polyp    Dyspnea    GERD (gastroesophageal reflux disease)    History of hiatal hernia    Hyperlipidemia    Personal history of radiation therapy    Stroke [facility])    TIA before 2013   TIA (transient ischemic attack)    Vitamin D deficiency    Past Surgical History:  Procedure Laterality Date   APPENDECTOMY     BREAST LUMPECTOMY Left 2006   BREAST SURGERY     2006...treated with Tamoxifen for 6 years   CHOLECYSTECTOMY     COLONOSCOPY     ESOPHAGOGASTRODUODENOSCOPY N/A 09/01/2020   Procedure: ESOPHAGOGASTRODUODENOSCOPY (EGD);  Surgeon: Lajuana Matte, MD;  Location: [facility];  Service: Thoracic;  Laterality: N/A;   XI ROBOTIC ASSISTED PARAESOPHAGEAL HERNIA REPAIR N/A 09/01/2020   Procedure: XI ROBOTIC ASSISTED HIATAL HERNIA REPAIR WITH FUNDOPLICATION AND GASTROPEXY;  Surgeon: Lajuana Matte, MD;  Location: [facility];  Service: Thoracic;  Laterality: N/A;   Patient Active Problem List   Diagnosis Date Noted   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   Weakness 05/08/2022   Left foot drop 05/08/2022   Dysarthria 05/08/2022   Gait abnormality 05/08/2022   S/P repair of paraesophageal hernia 09/01/2020   ILD (interstitial lung disease) ([place]) 07/21/2020   Pain in right knee 12/30/2018   Trochanteric  bursitis of right hip 12/30/2018   Low back pain 04/19/2016   Abnormality of gait 03/29/2016   Fall 03/29/2016   Dyspnea 11/10/2014   GERD (gastroesophageal reflux disease) 11/10/2014   TIA (transient ischemic attack)    Vitamin D deficiency    Allergic rhinitis    CKD (chronic kidney disease)    Colon polyp    Hyperlipidemia     ONSET DATE: 07/31/2022     REFERRING DIAG: G12.20 (ICD-10-CM) - Motor neuron disease, unspecified   THERAPY DIAG:  Muscle weakness (generalized)  Rationale for Evaluation and Treatment Rehabilitation  SUBJECTIVE:   SUBJECTIVE STATEMENT: It has not affected my arms yet.  Pt accompanied by: self  PERTINENT HISTORY: HLD, Left breast Cancer, s/p left lobectomy, radiation.Chronic kidney disease,  GERD,TIA- 20 years ago (right facial numbness, dysarthria) ,Hiatal Hernia surgery, EPI  PRECAUTIONS: Fall  WEIGHT BEARING RESTRICTIONS No  PAIN:  Are you having pain? No  FALLS: Has patient fallen in last 6 months? Yes. Number of falls 3  LIVING ENVIRONMENT: Lives with: lives with their spouse Lives in: House/apartment   PLOF: Independent with basic ADLs  PATIENT GOALS Wants to reduce falls  OBJECTIVE:   HAND DOMINANCE: Left  ADLs: Overall ADLs: Independent Transfers/ambulation related to ADLs: Uses rollator in her  home when she takes off her AFO Eating: Independent Grooming: Independent UB Dressing: Independent LB Dressing: Independent Toileting: Independent Bathing: Modified Independent - uses grab bars Tub Shower transfers: Mod I - recommend patient consider tub transfer bench - patient in agreement Equipment: rollator, cane   IADLs: Shopping: Mod I Light housekeeping: Bedmaking Meal Prep: Prepares full meals Community mobility: Independent Medication management: Tourist information centre manager: Independent Handwriting: 100% legible  MOBILITY STATUS: Hx of falls  POSTURE COMMENTS:  No Significant postural  limitations Sitting balance:  WFL  ACTIVITY TOLERANCE: Activity tolerance: WFL    UPPER EXTREMITY ROM     Active ROM Right eval Left eval  Shoulder flexion [facility]  Shoulder abduction THRUOUT THRUOUT  Shoulder adduction    Shoulder extension    Shoulder internal rotation    Shoulder external rotation    Elbow flexion    Elbow extension    Wrist flexion    Wrist extension    Wrist ulnar deviation    Wrist radial deviation    Wrist pronation    Wrist supination    (Blank rows = not tested)   UPPER EXTREMITY MMT:     MMT Right eval Left eval  Shoulder flexion 4+/5 4+/5  Shoulder abduction THRUOUT THRUOUT  Shoulder adduction    Shoulder extension    Shoulder internal rotation    Shoulder external rotation    Middle trapezius    Lower trapezius    Elbow flexion    Elbow extension    Wrist flexion    Wrist extension    Wrist ulnar deviation    Wrist radial deviation    Wrist pronation    Wrist supination    (Blank rows = not tested)  HAND FUNCTION: Grip strength: Right: 45.4 lbs; Left: 46.9 lbs and Lateral pinch: Right: NT lbs, Left: NT lbs  COORDINATION: 9 Hole Peg test: Right: 28.63 sec; Left: 28.63 sec  SENSATION: WFL    MUSCLE TONE: RUE: Within functional limits and LUE: Within functional limits  COGNITION: Overall cognitive status: Within functional limits for tasks assessed   VISION ASSESSMENT: Not tested     TODAY'S TREATMENT:  Patient here for OT evaluation today.  Patient reports that she is completing ADL/IADL without assistance at this time - although has frequent falls.  Patient actively receiving PT at this time.  Will reassess OT needs in 2 months.   Discussed potential need for tub transfer bench - patient will investigate.     PATIENT EDUCATION: Education details: Tub equipment Person educated: Patient Education method: Consulting civil engineer and given Market researcher on line Education comprehension: verbalized  understanding   HOME EXERCISE PROGRAM: NA     ASSESSMENT:  CLINICAL IMPRESSION: Patient is a 76 y.o. female who was seen today for occupational therapy evaluation for motor neuro disease. Patient reports LLE weakness and frequent falls. Patient has been referred to [facility] - and has appointments scheduled for November.  Will reassess OT needs as warranted in 2 months.  Patient with adequate upper body strength and independence with BADL/IADL at this time.    PERFORMANCE DEFICITS in functional skills including strength  IMPAIRMENTS are leading to frequent falls.   COMORBIDITIES may have co-morbidities  that affects occupational performance. Patient will benefit from skilled OT to address above impairments and improve overall function.  MODIFICATION OR ASSISTANCE TO COMPLETE EVALUATION: No modification of tasks or assist necessary to complete an evaluation.  OT OCCUPATIONAL PROFILE AND HISTORY: Detailed assessment: Review of records and additional review of  physical, cognitive, psychosocial history related to current functional performance.  CLINICAL DECISION MAKING: Moderate - several treatment options, min-mod task modification necessary  REHAB POTENTIAL: Good  EVALUATION COMPLEXITY: Moderate    PLAN: OT FREQUENCY:  Reassess in 8 weeks/ 2 months   PLAN FOR NEXT SESSION: reassess OT needs if patient experiences disease progression   Mariah Milling, OT 09/13/2022, 1:38 PM

## 2022-09-17 ENCOUNTER — Encounter: Payer: Medicare Other | Admitting: Occupational Therapy

## 2022-09-17 ENCOUNTER — Ambulatory Visit: Payer: Medicare Other | Admitting: Physical Therapy

## 2022-09-17 ENCOUNTER — Encounter: Payer: Self-pay | Admitting: Physical Therapy

## 2022-09-17 DIAGNOSIS — M6281 Muscle weakness (generalized): Secondary | ICD-10-CM | POA: Diagnosis not present

## 2022-09-17 DIAGNOSIS — Z9181 History of falling: Secondary | ICD-10-CM

## 2022-09-17 DIAGNOSIS — R2689 Other abnormalities of gait and mobility: Secondary | ICD-10-CM

## 2022-09-17 NOTE — Therapy (Signed)
OUTPATIENT PHYSICAL THERAPY NEURO TREATMENT-[facility]   Patient Name: Yvonne Jordan MRN: [id number] DOB:12/07/1946, 76 y.o., female Today's Date: 09/17/2022   PCP: Antony Contras, MD  REFERRING PROVIDER: Antony Contras, MD    PT End of Session - 09/17/22 1114     Visit Number 6    Number of Visits 16   47+8, recert   Date for PT Re-Evaluation 10/25/22    Authorization Type Medicare    Progress Note Due on Visit 10    PT Start Time 1035   pt late for appt   PT Stop Time 1105    PT Time Calculation (min) 30 min    Equipment Utilized During Treatment Other (comment)   pool noodle, single bar bells, aquatic step   Activity Tolerance Patient tolerated treatment well    Behavior During Therapy WFL for tasks assessed/performed                Past Medical History:  Diagnosis Date   Allergic rhinitis    Arthritis    Cancer ([place])    Breast- left   CKD (chronic kidney disease)    stage III   Colon polyp    Dyspnea    GERD (gastroesophageal reflux disease)    History of hiatal hernia    Hyperlipidemia    Personal history of radiation therapy    Stroke [facility])    TIA before 2013   TIA (transient ischemic attack)    Vitamin D deficiency    Past Surgical History:  Procedure Laterality Date   APPENDECTOMY     BREAST LUMPECTOMY Left 2006   BREAST SURGERY     2006...treated with Tamoxifen for 6 years   CHOLECYSTECTOMY     COLONOSCOPY     ESOPHAGOGASTRODUODENOSCOPY N/A 09/01/2020   Procedure: ESOPHAGOGASTRODUODENOSCOPY (EGD);  Surgeon: Lajuana Matte, MD;  Location: [place];  Service: Thoracic;  Laterality: N/A;   XI ROBOTIC ASSISTED PARAESOPHAGEAL HERNIA REPAIR N/A 09/01/2020   Procedure: XI ROBOTIC ASSISTED HIATAL HERNIA REPAIR WITH FUNDOPLICATION AND GASTROPEXY;  Surgeon: Lajuana Matte, MD;  Location: [place];  Service: Thoracic;  Laterality: N/A;   Patient Active Problem List   Diagnosis Date Noted   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   Weakness 05/08/2022    Left foot drop 05/08/2022   Dysarthria 05/08/2022   Gait abnormality 05/08/2022   S/P repair of paraesophageal hernia 09/01/2020   ILD (interstitial lung disease) ([place]) 07/21/2020   Pain in right knee 12/30/2018   Trochanteric bursitis of right hip 12/30/2018   Low back pain 04/19/2016   Abnormality of gait 03/29/2016   Fall 03/29/2016   Dyspnea 11/10/2014   GERD (gastroesophageal reflux disease) 11/10/2014   TIA (transient ischemic attack)    Vitamin D deficiency    Allergic rhinitis    CKD (chronic kidney disease)    Colon polyp    Hyperlipidemia     ONSET DATE: 07/31/2022   REFERRING DIAG: G12.20 (ICD-10-CM) - Motor neuron disease, unspecified   THERAPY DIAG:  Muscle weakness (generalized)  Other abnormalities of gait and mobility  History of falling  Rationale for Evaluation and Treatment Rehabilitation   PERTINENT HISTORY: HLD, Left breast Cancer, s/p left lobectomy, radiation.Chronic kidney disease,  GERD,TIA- 20 years ago (right facial numbness, dysarthria) ,Hiatal Hernia surgery   PRECAUTIONS: Fall  PATIENT GOALS: "everything I have read about ALS says to work on ROM"   SUBJECTIVE STATEMENT: Pt running late for appt as she reports she had to park really far away.  Reports not feeling well over the weekend, better today. Pt arrives for pool wearing slip on sandals with wedge heel stating "I know I shouldn't wear these, however they are easier at the pool". Did have her sneaker with AFO with her.   Pt [facility] her [facility] 7.   Pt accompanied by: self  PAIN:  Are you having pain? No   OBJECTIVE:   DIAGNOSTIC FINDINGS: EMG nerve conduction study May 30, 2022 showed evidence of widespread chronic neuropathic changes involving right sternocleidomastoid, genioglossus, right cervical, bilateral lumbar sacral myotomes, also evidence of active denervation at mid and lower thoracic paraspinal muscles. 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  Patient seen for aquatic therapy today.  Treatment took place in water 3.5-4.5 feet deep depending upon activity.  Pt entered/exited pool via stairs with rails.    With yellow noodle in ~4.0-4.3 water depth Forward gait for 18 feet across pool x 8 laps Backward gait for 18 feet across pool x 8 laps Side stepping left<>right for 18 feet across pool x 4 laps each way   Seated on bench in water with feet on aquatic step: With single bar bells: performed the following for 10 reps each with cues on form/technique. Alternating punching Shoulder horizontal abd/add Holding arms straight out in front for alternating lowering arms down under water/back to water surface Holding arms straight out at sides for alternating lowering arms down under water/back to water surface  With arms on bench for balance performed the following for 10 reps to bil LE's.  Long arc quads Hip abd/add with LE extended out in water    Pt requires buoyancy of water for support for reduced fall risk with gait training and balance exercises with minimal UE support; exercises able to be performed safely in water without the risk of fall compared to those same exercises performed on land;  viscosity of water needed for resistance for strengthening.  Current of water provides perturbations for challenging static & dynamic standing balance.      PATIENT EDUCATION: Education details: reinforced continue HEP, avoid strenuous activity/exercise, energy conservation, continued use of rollator at home and in the community Person educated: Patient Education method: Explanation Education comprehension: verbalized understanding   HOME EXERCISE PROGRAM: Access Code: [email or web address] URL: [email or web address] Date: 08/22/2022 Prepared by: Excell Seltzer  Exercises - Supine Single Knee to Chest Stretch  - 1-2 x daily - 7 x  weekly - 2 sets - 5 reps - 30-60 sec hold - Supine Figure 4 Piriformis Stretch  - 1-2 x daily - 7 x weekly - 2 sets - 5 reps - 30-60 sec hold - Long Sitting Calf Stretch and Hamstring Stretch with Strap  - 1-2 x daily - 7 x weekly - 2 sets - 5 reps - 30-60 sec hold - Supine Butterfly Groin Stretch  - 1-2 x daily - 7 x weekly - 2 sets - 5 reps - 30-60 sec hold   GOALS: Goals reviewed with patient? Yes    NEW LONG TERM GOALS: Target date: 10/25/2022  Pt will be independent with final strengthening and balance HEP for improved strength, balance, transfers and gait. Baseline:  Goal status: IN PROGRESS  2.  Pt will improve gait velocity to at least 2.5 ft/sec for improved gait efficiency and performance at Independent level  Baseline: 2.4 ft/sec (8/14), 2.12 ft/sec (9/21) Goal status:  REVISED  3.  Pt will improve FGA to 12/30 for decreased fall risk  Baseline: 9/30 (9/22) Goal status: INITIAL    ASSESSMENT:  CLINICAL IMPRESSION: Today's skilled session continued to focus on gait and strengthening in the aquatic setting. No issues noted or reported by patient.    OBJECTIVE IMPAIRMENTS Abnormal gait, decreased balance, difficulty walking, decreased strength, dizziness, impaired perceived functional ability, impaired sensation, impaired tone, and postural dysfunction.   ACTIVITY LIMITATIONS carrying, lifting, bending, standing, squatting, stairs, transfers, and caring for others  PARTICIPATION LIMITATIONS: meal prep, cleaning, laundry, driving, community activity, and church  PERSONAL FACTORS Age and 1-2 comorbidities:    HLD, Left breast Cancer, s/p left lobectomy, radiation, chronic kidney disease, GERD,TIA- 20 years ago (right facial numbness, dysarthria), and hiatal hernia surgery are also affecting patient's functional outcome.   REHAB POTENTIAL: Good  CLINICAL DECISION MAKING: Evolving/moderate complexity  EVALUATION COMPLEXITY: Moderate  PLAN: PT FREQUENCY: 2x/week  PT  DURATION: 6 weeks+ 6 weeks (recert)  PLANNED INTERVENTIONS: Therapeutic exercises, Therapeutic activity, Neuromuscular re-education, Balance training, Gait training, Patient/Family education, Self Care, Joint mobilization, Stair training, Vestibular training, Canalith repositioning, Visual/preceptual remediation/compensation, Orthotic/Fit training, DME instructions, Aquatic Therapy, Electrical stimulation, Wheelchair mobility training, Cryotherapy, Moist heat, Taping, Manual therapy, and Re-evaluation  PLAN FOR NEXT SESSION:   assess vestibular symptoms, assess HEP and add to as needed, ALS education, energy conservation education (is pt using her rollator in the community?), balance training especially with turns/narrow BOS/step overs    Willow Ora, PTA, [place] [address] [place], [place] [address] [phone] 09/17/22, 11:16 AM

## 2022-09-19 ENCOUNTER — Encounter: Payer: Medicare Other | Admitting: Occupational Therapy

## 2022-09-19 ENCOUNTER — Ambulatory Visit: Payer: Medicare Other | Admitting: Physical Therapy

## 2022-09-19 DIAGNOSIS — Z9181 History of falling: Secondary | ICD-10-CM

## 2022-09-19 DIAGNOSIS — R2689 Other abnormalities of gait and mobility: Secondary | ICD-10-CM

## 2022-09-19 DIAGNOSIS — M6281 Muscle weakness (generalized): Secondary | ICD-10-CM | POA: Diagnosis not present

## 2022-09-19 NOTE — Therapy (Signed)
OUTPATIENT PHYSICAL THERAPY NEURO TREATMENT   Patient Name: Yvonne Jordan MRN: [id number] DOB:1946-04-14, 76 y.o., female Today's Date: 09/19/2022   PCP: Antony Contras, MD  REFERRING PROVIDER: Antony Contras, MD    PT End of Session - 09/19/22 1407     Visit Number 7    Number of Visits 16   40+9, recert   Date for PT Re-Evaluation 10/25/22    Authorization Type Medicare    Progress Note Due on Visit 10    PT Start Time 1404   pt late   PT Stop Time 1450    PT Time Calculation (min) 46 min    Equipment Utilized During Treatment Gait belt    Activity Tolerance Patient tolerated treatment well    Behavior During Therapy WFL for tasks assessed/performed                 Past Medical History:  Diagnosis Date   Allergic rhinitis    Arthritis    Cancer ([place])    Breast- left   CKD (chronic kidney disease)    stage III   Colon polyp    Dyspnea    GERD (gastroesophageal reflux disease)    History of hiatal hernia    Hyperlipidemia    Personal history of radiation therapy    Stroke [facility])    TIA before 2013   TIA (transient ischemic attack)    Vitamin D deficiency    Past Surgical History:  Procedure Laterality Date   APPENDECTOMY     BREAST LUMPECTOMY Left 2006   BREAST SURGERY     2006...treated with Tamoxifen for 6 years   CHOLECYSTECTOMY     COLONOSCOPY     ESOPHAGOGASTRODUODENOSCOPY N/A 09/01/2020   Procedure: ESOPHAGOGASTRODUODENOSCOPY (EGD);  Surgeon: Lajuana Matte, MD;  Location: [place];  Service: Thoracic;  Laterality: N/A;   XI ROBOTIC ASSISTED PARAESOPHAGEAL HERNIA REPAIR N/A 09/01/2020   Procedure: XI ROBOTIC ASSISTED HIATAL HERNIA REPAIR WITH FUNDOPLICATION AND GASTROPEXY;  Surgeon: Lajuana Matte, MD;  Location: [place];  Service: Thoracic;  Laterality: N/A;   Patient Active Problem List   Diagnosis Date Noted   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   Weakness 05/08/2022   Left foot drop 05/08/2022   Dysarthria 05/08/2022   Gait  abnormality 05/08/2022   S/P repair of paraesophageal hernia 09/01/2020   ILD (interstitial lung disease) ([place]) 07/21/2020   Pain in right knee 12/30/2018   Trochanteric bursitis of right hip 12/30/2018   Low back pain 04/19/2016   Abnormality of gait 03/29/2016   Fall 03/29/2016   Dyspnea 11/10/2014   GERD (gastroesophageal reflux disease) 11/10/2014   TIA (transient ischemic attack)    Vitamin D deficiency    Allergic rhinitis    CKD (chronic kidney disease)    Colon polyp    Hyperlipidemia     ONSET DATE: 07/31/2022   REFERRING DIAG: G12.20 (ICD-10-CM) - Motor neuron disease, unspecified   THERAPY DIAG:  Muscle weakness (generalized)  Other abnormalities of gait and mobility  History of falling  Rationale for Evaluation and Treatment Rehabilitation   PERTINENT HISTORY: HLD, Left breast Cancer, s/p left lobectomy, radiation.Chronic kidney disease,  GERD,TIA- 20 years ago (right facial numbness, dysarthria) ,Hiatal Hernia surgery   PRECAUTIONS: Fall  PATIENT GOALS: "everything I have read about ALS says to work on ROM"   SUBJECTIVE STATEMENT: Pt reports she is doing "ok" today. No falls or near falls since last visit. Pt has some ongoing soreness in her L big  toe from where she injured in during her fall while on vacation. Pt enters the clinic with her [facility], reports she uses her rollator at home, [facility] in the community. Encouraged use of rollator in community. Pt also reports some difficulty maintainting balance with quick turns.  Pt has her visit with [facility] Nov 6 & 7.   Pt accompanied by: self  PAIN:  Are you having pain? No   OBJECTIVE:   DIAGNOSTIC FINDINGS: EMG nerve conduction study May 30, 2022 showed evidence of widespread chronic neuropathic changes involving right sternocleidomastoid, genioglossus, right cervical, bilateral lumbar sacral myotomes, also evidence of active denervation at mid and lower thoracic paraspinal muscles. Above findings  most supportive of motor neuron disease    TODAY'S TREATMENT:  NMR: Standing turning L/R in circle with focus on tapping colored dots/targets with v/c for LE and for color of target, CGA for balance. Pt reports feeling "wobbly" following activity with one LOB needing min A to recover. Pt with increased difficulty turning to the L vs to the R.  In // bars: -cane step-overs alt L/R x 10 reps each, occasional catching of RLE -progression to foam block step-overs 2 x 10 reps each, occasional catching of BLE  -tandem gait with BUE support, progression to one UE support, progression to fingertip support but unable to maintain balance -static tandem stance up to 5 sec with no UE support -L/R modified tandem stance with no UE support with focus on maintaining for 30 sec, added to HEP (see bolded below)   PATIENT EDUCATION: Education details: reinforced continue HEP, avoid strenuous activity/exercise, energy conservation, continued use of rollator at home and in the community Person educated: Patient Education method: Explanation and Handouts Education comprehension: verbalized understanding   HOME EXERCISE PROGRAM: Access Code: [email or web address] URL: [email or web address] Date: 08/22/2022 Prepared by: Excell Seltzer  Exercises - Supine Single Knee to Chest Stretch  - 1-2 x daily - 7 x weekly - 2 sets - 5 reps - 30-60 sec hold - Supine Figure 4 Piriformis Stretch  - 1-2 x daily - 7 x weekly - 2 sets - 5 reps - 30-60 sec hold - Long Sitting Calf Stretch and Hamstring Stretch with Strap  - 1-2 x daily - 7 x weekly - 2 sets - 5 reps - 30-60 sec hold - Supine Butterfly Groin Stretch  - 1-2 x daily - 7 x weekly - 2 sets - 5 reps - 30-60 sec hold - Wide Tandem Stance with Eyes Open  - 1 x daily - 7 x weekly - 1 sets - 5 reps - 30 sec hold   GOALS: Goals reviewed with patient? Yes    NEW LONG TERM GOALS: Target date: 10/25/2022  Pt will be independent with final strengthening and  balance HEP for improved strength, balance, transfers and gait. Baseline:  Goal status: IN PROGRESS  2.  Pt will improve gait velocity to at least 2.5 ft/sec for improved gait efficiency and performance at Independent level  Baseline: 2.4 ft/sec (8/14), 2.12 ft/sec (9/21) Goal status: REVISED  3.  Pt will improve FGA to 12/30 for decreased fall risk  Baseline: 9/30 (9/22) Goal status: INITIAL    ASSESSMENT:  CLINICAL IMPRESSION: Emphasis of skilled PT session on working on balance activities including turning, stepping over obstacles, and standing and gait with narrow BOS. Pt exhibits decreased balance when turning to the L compared to the R, ongoing difficulty stepping over obstacles, and decreased ability to maintain balance with narrow BOS. Pt  continues to benefit from skilled therapy services to address ongoing gait, balance and ROM deficits as well as continue education regarding ALS diagnosis and energy conservation. Continue POC.    OBJECTIVE IMPAIRMENTS Abnormal gait, decreased balance, difficulty walking, decreased strength, dizziness, impaired perceived functional ability, impaired sensation, impaired tone, and postural dysfunction.   ACTIVITY LIMITATIONS carrying, lifting, bending, standing, squatting, stairs, transfers, and caring for others  PARTICIPATION LIMITATIONS: meal prep, cleaning, laundry, driving, community activity, and church  PERSONAL FACTORS Age and 1-2 comorbidities:    HLD, Left breast Cancer, s/p left lobectomy, radiation, chronic kidney disease, GERD,TIA- 20 years ago (right facial numbness, dysarthria), and hiatal hernia surgery are also affecting patient's functional outcome.   REHAB POTENTIAL: Good  CLINICAL DECISION MAKING: Evolving/moderate complexity  EVALUATION COMPLEXITY: Moderate  PLAN: PT FREQUENCY: 2x/week  PT DURATION: 6 weeks+ 6 weeks (recert)  PLANNED INTERVENTIONS: Therapeutic exercises, Therapeutic activity, Neuromuscular  re-education, Balance training, Gait training, Patient/Family education, Self Care, Joint mobilization, Stair training, Vestibular training, Canalith repositioning, Visual/preceptual remediation/compensation, Orthotic/Fit training, DME instructions, Aquatic Therapy, Electrical stimulation, Wheelchair mobility training, Cryotherapy, Moist heat, Taping, Manual therapy, and Re-evaluation  PLAN FOR NEXT SESSION:   assess vestibular symptoms, assess HEP and add to as needed, ALS education, energy conservation education (is pt using her rollator in the community?), balance training especially with turns/narrow BOS/step overs    Excell Seltzer, PT, DPT, [place] [address] [place], [place] [address] [phone] 09/19/22, 2:56 PM

## 2022-09-24 ENCOUNTER — Ambulatory Visit: Payer: Medicare Other | Admitting: Physical Therapy

## 2022-09-24 ENCOUNTER — Encounter: Payer: Medicare Other | Admitting: Occupational Therapy

## 2022-09-26 ENCOUNTER — Ambulatory Visit: Payer: Medicare Other | Attending: Family Medicine | Admitting: Physical Therapy

## 2022-09-26 ENCOUNTER — Encounter: Payer: Medicare Other | Admitting: Occupational Therapy

## 2022-09-26 DIAGNOSIS — R2689 Other abnormalities of gait and mobility: Secondary | ICD-10-CM | POA: Diagnosis not present

## 2022-09-26 DIAGNOSIS — R2681 Unsteadiness on feet: Secondary | ICD-10-CM | POA: Insufficient documentation

## 2022-09-26 DIAGNOSIS — R42 Dizziness and giddiness: Secondary | ICD-10-CM | POA: Insufficient documentation

## 2022-09-26 DIAGNOSIS — Z9181 History of falling: Secondary | ICD-10-CM | POA: Insufficient documentation

## 2022-09-26 DIAGNOSIS — M6281 Muscle weakness (generalized): Secondary | ICD-10-CM | POA: Diagnosis not present

## 2022-09-26 NOTE — Therapy (Signed)
OUTPATIENT PHYSICAL THERAPY NEURO TREATMENT   Patient Name: Yvonne Jordan MRN: [id number] DOB:[birth year], 76 y.o., female Today's Date: 09/26/2022   PCP: Antony Contras, MD  REFERRING PROVIDER: Antony Contras, MD    PT End of Session - 09/26/22 1324     Visit Number 8    Number of Visits 42   40+9, recert   Date for PT Re-Evaluation 10/25/22    Authorization Type Medicare    Progress Note Due on Visit 10    PT Start Time 1322   Pt arrived late   PT Stop Time 26    PT Time Calculation (min) 36 min    Equipment Utilized During Treatment Gait belt    Activity Tolerance Patient tolerated treatment well    Behavior During Therapy WFL for tasks assessed/performed                  Past Medical History:  Diagnosis Date   Allergic rhinitis    Arthritis    Cancer ([place])    Breast- left   CKD (chronic kidney disease)    stage III   Colon polyp    Dyspnea    GERD (gastroesophageal reflux disease)    History of hiatal hernia    Hyperlipidemia    Personal history of radiation therapy    Stroke [facility])    TIA before 2013   TIA (transient ischemic attack)    Vitamin D deficiency    Past Surgical History:  Procedure Laterality Date   APPENDECTOMY     BREAST LUMPECTOMY Left 2006   BREAST SURGERY     2006...treated with Tamoxifen for 6 years   CHOLECYSTECTOMY     COLONOSCOPY     ESOPHAGOGASTRODUODENOSCOPY N/A 09/01/2020   Procedure: ESOPHAGOGASTRODUODENOSCOPY (EGD);  Surgeon: Lajuana Matte, MD;  Location: [place];  Service: Thoracic;  Laterality: N/A;   XI ROBOTIC ASSISTED PARAESOPHAGEAL HERNIA REPAIR N/A 09/01/2020   Procedure: XI ROBOTIC ASSISTED HIATAL HERNIA REPAIR WITH FUNDOPLICATION AND GASTROPEXY;  Surgeon: Lajuana Matte, MD;  Location: [place];  Service: Thoracic;  Laterality: N/A;   Patient Active Problem List   Diagnosis Date Noted   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   Weakness 05/08/2022   Left foot drop 05/08/2022   Dysarthria 05/08/2022    Gait abnormality 05/08/2022   S/P repair of paraesophageal hernia 09/01/2020   ILD (interstitial lung disease) ([place]) 07/21/2020   Pain in right knee 12/30/2018   Trochanteric bursitis of right hip 12/30/2018   Low back pain 04/19/2016   Abnormality of gait 03/29/2016   Fall 03/29/2016   Dyspnea 11/10/2014   GERD (gastroesophageal reflux disease) 11/10/2014   TIA (transient ischemic attack)    Vitamin D deficiency    Allergic rhinitis    CKD (chronic kidney disease)    Colon polyp    Hyperlipidemia     ONSET DATE: 07/31/2022   REFERRING DIAG: G12.20 (ICD-10-CM) - Motor neuron disease, unspecified   THERAPY DIAG:  Unsteadiness on feet  Dizziness and giddiness  Muscle weakness (generalized)  Rationale for Evaluation and Treatment Rehabilitation   PERTINENT HISTORY: HLD, Left breast Cancer, s/p left lobectomy, radiation.Chronic kidney disease,  GERD,TIA- 20 years ago (right facial numbness, dysarthria) ,Hiatal Hernia surgery   PRECAUTIONS: Fall  PATIENT GOALS: "everything I have read about ALS says to work on ROM"   SUBJECTIVE STATEMENT: Pt ambulated into clinic w/SPC. States she will be "rated worse" if she walks in [facility] w/a rollator and she does not want to do  that. Continues to have dizzy spells, has never taken meds for it. No falls or near misses   Pt has her visit with [facility] Nov 6 & 7.   Pt accompanied by: self  PAIN:  Are you having pain? No   OBJECTIVE:   DIAGNOSTIC FINDINGS: EMG nerve conduction study May 30, 2022 showed evidence of widespread chronic neuropathic changes involving right sternocleidomastoid, genioglossus, right cervical, bilateral lumbar sacral myotomes, also evidence of active denervation at mid and lower thoracic paraspinal muscles. Above findings most supportive of motor neuron disease    TODAY'S TREATMENT:  Self-care  Discussed importance of energy conservation, as pt reports using the rollator is easy and she feels  "wobbly" w/cane. Pt adamant about using cane due to seeing a survey online that "rates you lower if you use a rollator" and her fear of being rated lower at the [facility] clinic. Emphasized how important it is for pt to maintain her walking and avoid overdoing activity, as her body is unable to recover as well. Pt verbalized understanding. "I will have my rollator next time"   Discussed aquatic therapy appointments and pt not interested in performing until she goes to [facility] clinic.    Therapeutic Activity   MCTSIB: Condition 1: Avg of 3 trials: 30 sec, Condition 2: Avg of 3 trials: 30 sec, Condition 3: Avg of 3 trials: 30 sec, Condition 4: Avg of 3 trials: 8.19s (5.47s, 6.91s, 12.19s) , and Total Score: 128.19/120 Noted minor sway to R side that increased to mod on conditions 3&4. Pt lost balance posterolaterally to R on each trial of condition 4   NMR  Seated VOR x1 in horizontal and vertical direction for improved vestibular input, 2x45s. Pt reported 3-4/10 pain in R SCM w/horizontal direction. Pt required min cues to maintain gaze on X throughout. Did not add to HEP due to pain in neck. Pt asymptomatic w/movement   Gait pattern: step through pattern, decreased arm swing- Left, decreased step length- Right, decreased step length- Left, decreased stride length, decreased ankle dorsiflexion- Right, decreased trunk rotation, and wide BOS Distance walked: Various clinic distances  Assistive device utilized: Single point cane and R AFO Level of assistance: SBA Comments: Pt demonstrated guarded walking pattern w/use of SPC compared to w/rollator      PATIENT EDUCATION: Education details: reinforced continued use of rollator at home and in the community, continue HEP, plan to assess BPPV next session  Person educated: Patient Education method: Explanation and Handouts Education comprehension: verbalized understanding   HOME EXERCISE PROGRAM: Access Code: [email or web address] URL:  [email or web address] Date: 08/22/2022 Prepared by: Excell Seltzer  Exercises - Supine Single Knee to Chest Stretch  - 1-2 x daily - 7 x weekly - 2 sets - 5 reps - 30-60 sec hold - Supine Figure 4 Piriformis Stretch  - 1-2 x daily - 7 x weekly - 2 sets - 5 reps - 30-60 sec hold - Long Sitting Calf Stretch and Hamstring Stretch with Strap  - 1-2 x daily - 7 x weekly - 2 sets - 5 reps - 30-60 sec hold - Supine Butterfly Groin Stretch  - 1-2 x daily - 7 x weekly - 2 sets - 5 reps - 30-60 sec hold - Wide Tandem Stance with Eyes Open  - 1 x daily - 7 x weekly - 1 sets - 5 reps - 30 sec hold   GOALS: Goals reviewed with patient? Yes    NEW LONG TERM GOALS: Target date: 10/25/2022  Pt  will be independent with final strengthening and balance HEP for improved strength, balance, transfers and gait. Baseline:  Goal status: IN PROGRESS  2.  Pt will improve gait velocity to at least 2.5 ft/sec for improved gait efficiency and performance at Independent level  Baseline: 2.4 ft/sec (8/14), 2.12 ft/sec (9/21) Goal status: REVISED  3.  Pt will improve FGA to 12/30 for decreased fall risk  Baseline: 9/30 (9/22) Goal status: INITIAL    ASSESSMENT:  CLINICAL IMPRESSION: Emphasis of skilled PT session on pt education and vestibular assessment. Pt unable to hold condition 4 of MCTSIB, losing her balance posterolaterally in R direction each time. Pt asymptomatic w/VOR x1 but reported significant pain in SCM w/horizontal head nods, so did not add to HEP. Plan to assess BPPV next session. Pt did not bring rollator to clinic, so continued to encourage use of rollator for energy conservation. Continue POC.     OBJECTIVE IMPAIRMENTS Abnormal gait, decreased balance, difficulty walking, decreased strength, dizziness, impaired perceived functional ability, impaired sensation, impaired tone, and postural dysfunction.   ACTIVITY LIMITATIONS carrying, lifting, bending, standing, squatting,  stairs, transfers, and caring for others  PARTICIPATION LIMITATIONS: meal prep, cleaning, laundry, driving, community activity, and church  PERSONAL FACTORS Age and 1-2 comorbidities:    HLD, Left breast Cancer, s/p left lobectomy, radiation, chronic kidney disease, GERD,TIA- 20 years ago (right facial numbness, dysarthria), and hiatal hernia surgery are also affecting patient's functional outcome.   REHAB POTENTIAL: Good  CLINICAL DECISION MAKING: Evolving/moderate complexity  EVALUATION COMPLEXITY: Moderate  PLAN: PT FREQUENCY: 2x/week  PT DURATION: 6 weeks+ 6 weeks (recert)  PLANNED INTERVENTIONS: Therapeutic exercises, Therapeutic activity, Neuromuscular re-education, Balance training, Gait training, Patient/Family education, Self Care, Joint mobilization, Stair training, Vestibular training, Canalith repositioning, Visual/preceptual remediation/compensation, Orthotic/Fit training, DME instructions, Aquatic Therapy, Electrical stimulation, Wheelchair mobility training, Cryotherapy, Moist heat, Taping, Manual therapy, and Re-evaluation  PLAN FOR NEXT SESSION:   assess vestibular symptoms/BPPV, assess HEP and add to as needed, ALS education, energy conservation education (is pt using her rollator in the community?), balance training especially with turns/narrow BOS/step overs   Charlett Nose, PT, [place] [address] [place], [place]  [address] Phone:  [phone] Fax:  [phone] 09/26/22, 1:59 PM

## 2022-10-01 ENCOUNTER — Ambulatory Visit: Payer: Medicare Other | Admitting: Physical Therapy

## 2022-10-01 ENCOUNTER — Encounter: Payer: Medicare Other | Admitting: Occupational Therapy

## 2022-10-02 ENCOUNTER — Ambulatory Visit: Payer: Self-pay | Admitting: Physical Therapy

## 2022-10-03 ENCOUNTER — Ambulatory Visit: Payer: Medicare Other | Admitting: Physical Therapy

## 2022-10-03 ENCOUNTER — Encounter: Payer: Medicare Other | Admitting: Occupational Therapy

## 2022-10-08 ENCOUNTER — Encounter: Payer: Medicare Other | Admitting: Occupational Therapy

## 2022-10-08 ENCOUNTER — Ambulatory Visit: Payer: Medicare Other | Admitting: Physical Therapy

## 2022-10-10 ENCOUNTER — Ambulatory Visit: Payer: Medicare Other | Admitting: Physical Therapy

## 2022-10-10 ENCOUNTER — Encounter: Payer: Medicare Other | Admitting: Occupational Therapy

## 2022-10-10 DIAGNOSIS — R2689 Other abnormalities of gait and mobility: Secondary | ICD-10-CM | POA: Diagnosis not present

## 2022-10-10 DIAGNOSIS — Z9181 History of falling: Secondary | ICD-10-CM

## 2022-10-10 DIAGNOSIS — R2681 Unsteadiness on feet: Secondary | ICD-10-CM

## 2022-10-10 DIAGNOSIS — R42 Dizziness and giddiness: Secondary | ICD-10-CM | POA: Diagnosis not present

## 2022-10-10 DIAGNOSIS — M6281 Muscle weakness (generalized): Secondary | ICD-10-CM | POA: Diagnosis not present

## 2022-10-10 NOTE — Therapy (Signed)
OUTPATIENT PHYSICAL THERAPY NEURO TREATMENT   Patient Name: Yvonne Jordan MRN: [id number] DOB:Jun 21, 1946, 76 y.o., female Today's Date: 10/10/2022   PCP: Antony Contras, MD  REFERRING PROVIDER: Antony Contras, MD    PT End of Session - 10/10/22 1322     Visit Number 9    Number of Visits 16   78+4, recert   Date for PT Re-Evaluation 10/25/22    Authorization Type Medicare    Progress Note Due on Visit 10    PT Start Time 1320   Previous pt session ran late   PT Stop Time 45    PT Time Calculation (min) 38 min    Equipment Utilized During Treatment --    Activity Tolerance Patient tolerated treatment well    Behavior During Therapy WFL for tasks assessed/performed                  Past Medical History:  Diagnosis Date   Allergic rhinitis    Arthritis    Cancer ([place])    Breast- left   CKD (chronic kidney disease)    stage III   Colon polyp    Dyspnea    GERD (gastroesophageal reflux disease)    History of hiatal hernia    Hyperlipidemia    Personal history of radiation therapy    Stroke [facility])    TIA before 2013   TIA (transient ischemic attack)    Vitamin D deficiency    Past Surgical History:  Procedure Laterality Date   APPENDECTOMY     BREAST LUMPECTOMY Left 2006   BREAST SURGERY     2006...treated with Tamoxifen for 6 years   CHOLECYSTECTOMY     COLONOSCOPY     ESOPHAGOGASTRODUODENOSCOPY N/A 09/01/2020   Procedure: ESOPHAGOGASTRODUODENOSCOPY (EGD);  Surgeon: Lajuana Matte, MD;  Location: [place];  Service: Thoracic;  Laterality: N/A;   XI ROBOTIC ASSISTED PARAESOPHAGEAL HERNIA REPAIR N/A 09/01/2020   Procedure: XI ROBOTIC ASSISTED HIATAL HERNIA REPAIR WITH FUNDOPLICATION AND GASTROPEXY;  Surgeon: Lajuana Matte, MD;  Location: [place];  Service: Thoracic;  Laterality: N/A;   Patient Active Problem List   Diagnosis Date Noted   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   Weakness 05/08/2022   Left foot drop 05/08/2022   Dysarthria  05/08/2022   Gait abnormality 05/08/2022   S/P repair of paraesophageal hernia 09/01/2020   ILD (interstitial lung disease) ([place]) 07/21/2020   Pain in right knee 12/30/2018   Trochanteric bursitis of right hip 12/30/2018   Low back pain 04/19/2016   Abnormality of gait 03/29/2016   Fall 03/29/2016   Dyspnea 11/10/2014   GERD (gastroesophageal reflux disease) 11/10/2014   TIA (transient ischemic attack)    Vitamin D deficiency    Allergic rhinitis    CKD (chronic kidney disease)    Colon polyp    Hyperlipidemia     ONSET DATE: 07/31/2022   REFERRING DIAG: G12.20 (ICD-10-CM) - Motor neuron disease, unspecified   THERAPY DIAG:  Unsteadiness on feet  Muscle weakness (generalized)  Other abnormalities of gait and mobility  History of falling  Rationale for Evaluation and Treatment Rehabilitation   PERTINENT HISTORY: HLD, Left breast Cancer, s/p left lobectomy, radiation.Chronic kidney disease,  GERD,TIA- 20 years ago (right facial numbness, dysarthria) ,Hiatal Hernia surgery   PRECAUTIONS: Fall  PATIENT GOALS: "everything I have read about ALS says to work on ROM"   SUBJECTIVE STATEMENT: Pt ambulated into clinic w/rollator. States she has had 2 falls since last visit, once in kitchen  due to turning too quickly and once while carrying slide board to attempt to help her husband who got stuck between his chair and WC. Pt has significant bruising on L forearm and arm. No other changes   Pt has her visit with [facility] clinic Nov 6 & 7.   Pt accompanied by: self  PAIN:  Are you having pain? No   OBJECTIVE:   DIAGNOSTIC FINDINGS: EMG nerve conduction study May 30, 2022 showed evidence of widespread chronic neuropathic changes involving right sternocleidomastoid, genioglossus, right cervical, bilateral lumbar sacral myotomes, also evidence of active denervation at mid and lower thoracic paraspinal muscles. Above findings most supportive of motor neuron disease     TODAY'S TREATMENT: Ther Ex  The following were performed for improved cervical/thoracic ROM and pain modulation. Also added to HEP (see bolded below):  - Supine Chin Tuck, x12  - Seated Upper Trapezius Stretch, 1x30s per side  - Seated Posterior Shoulder Stretch, 1x30s per side  - Seated Lumbar Flexion Stretch, x3 reps w/5s hold - Seated Windmill Trunk Rotation Stretch, x5 per side   Manual Therapy  -Supine upper trap stretch, x3 minutes per side  -Supine anterior scalene stretch, x3 minutes per side    Gait pattern: step through pattern, decreased step length- Right, decreased step length- Left, decreased stride length, decreased ankle dorsiflexion- Right, decreased trunk rotation, and wide BOS Distance walked: Various clinic distances  Assistive device utilized: Environmental consultant - 4 wheeled and R AFO Level of assistance: SBA Comments: Pt ambulating slowly but no instability noted    PATIENT EDUCATION: Education details: reinforced continued use of rollator at all times, updates to HEP, importance of slowing down w/movement, especially turns  Person educated: Patient Education method: Explanation and Handouts Education comprehension: verbalized understanding   HOME EXERCISE PROGRAM: Access Code: [email or web address] URL: [email or web address] Date: 08/22/2022 Prepared by: Excell Seltzer  Exercises - Supine Single Knee to Chest Stretch  - 1-2 x daily - 7 x weekly - 2 sets - 5 reps - 30-60 sec hold - Supine Figure 4 Piriformis Stretch  - 1-2 x daily - 7 x weekly - 2 sets - 5 reps - 30-60 sec hold - Long Sitting Calf Stretch and Hamstring Stretch with Strap  - 1-2 x daily - 7 x weekly - 2 sets - 5 reps - 30-60 sec hold - Supine Butterfly Groin Stretch  - 1-2 x daily - 7 x weekly - 2 sets - 5 reps - 30-60 sec hold - Wide Tandem Stance with Eyes Open  - 1 x daily - 7 x weekly - 1 sets - 5 reps - 30 sec hold - Supine Chin Tuck  - 1 x daily - 7 x weekly - 3 sets - 10 reps - Seated  Upper Trapezius Stretch  - 1 x daily - 7 x weekly - 3 sets - 10 reps - Seated Posterior Shoulder Stretch  - 1 x daily - 7 x weekly - 3 sets - 10 reps - Seated Lumbar Flexion Stretch  - 1 x daily - 7 x weekly - 3 sets - 10 reps - Seated Windmill Trunk Rotation Stretch  - 1 x daily - 7 x weekly - 3 sets - 10 reps   GOALS: Goals reviewed with patient? Yes    NEW LONG TERM GOALS: Target date: 10/25/2022  Pt will be independent with final strengthening and balance HEP for improved strength, balance, transfers and gait. Baseline:  Goal status: IN PROGRESS  2.  Pt will improve  gait velocity to at least 2.5 ft/sec for improved gait efficiency and performance at Independent level  Baseline: 2.4 ft/sec (8/14), 2.12 ft/sec (9/21) Goal status: REVISED  3.  Pt will improve FGA to 12/30 for decreased fall risk  Baseline: 9/30 (9/22) Goal status: INITIAL    ASSESSMENT:  CLINICAL IMPRESSION: Emphasis of skilled PT session on improved cervical and BUE ROM and pt education. Educated pt on obtaining 24 hour caregiver for home due to her increased frequency of falls and increased fatigue. Pt reported she would think about it. Continue POC.     OBJECTIVE IMPAIRMENTS Abnormal gait, decreased balance, difficulty walking, decreased strength, dizziness, impaired perceived functional ability, impaired sensation, impaired tone, and postural dysfunction.   ACTIVITY LIMITATIONS carrying, lifting, bending, standing, squatting, stairs, transfers, and caring for others  PARTICIPATION LIMITATIONS: meal prep, cleaning, laundry, driving, community activity, and church  PERSONAL FACTORS Age and 1-2 comorbidities:    HLD, Left breast Cancer, s/p left lobectomy, radiation, chronic kidney disease, GERD,TIA- 20 years ago (right facial numbness, dysarthria), and hiatal hernia surgery are also affecting patient's functional outcome.   REHAB POTENTIAL: Good  CLINICAL DECISION MAKING: Evolving/moderate  complexity  EVALUATION COMPLEXITY: Moderate  PLAN: PT FREQUENCY: 2x/week  PT DURATION: 6 weeks+ 6 weeks (recert)  PLANNED INTERVENTIONS: Therapeutic exercises, Therapeutic activity, Neuromuscular re-education, Balance training, Gait training, Patient/Family education, Self Care, Joint mobilization, Stair training, Vestibular training, Canalith repositioning, Visual/preceptual remediation/compensation, Orthotic/Fit training, DME instructions, Aquatic Therapy, Electrical stimulation, Wheelchair mobility training, Cryotherapy, Moist heat, Taping, Manual therapy, and Re-evaluation  PLAN FOR NEXT SESSION:   assess HEP and add to as needed, ALS education, energy conservation education (is pt using her rollator in the community?), balance training especially with turns/narrow BOS/step overs   Charlett Nose, PT, [facility] [address] [place], [place]  [address] Phone:  [phone] Fax:  [phone] 10/10/22, 1:58 PM

## 2022-10-15 ENCOUNTER — Ambulatory Visit: Payer: Medicare Other | Admitting: Physical Therapy

## 2022-10-15 ENCOUNTER — Encounter: Payer: Medicare Other | Admitting: Occupational Therapy

## 2022-10-17 ENCOUNTER — Ambulatory Visit: Payer: Medicare Other | Admitting: Physical Therapy

## 2022-10-17 ENCOUNTER — Encounter: Payer: Medicare Other | Admitting: Occupational Therapy

## 2022-10-17 DIAGNOSIS — R2689 Other abnormalities of gait and mobility: Secondary | ICD-10-CM

## 2022-10-17 DIAGNOSIS — M6281 Muscle weakness (generalized): Secondary | ICD-10-CM | POA: Diagnosis not present

## 2022-10-17 DIAGNOSIS — R2681 Unsteadiness on feet: Secondary | ICD-10-CM

## 2022-10-17 DIAGNOSIS — Z9181 History of falling: Secondary | ICD-10-CM

## 2022-10-17 DIAGNOSIS — R42 Dizziness and giddiness: Secondary | ICD-10-CM | POA: Diagnosis not present

## 2022-10-17 NOTE — Therapy (Signed)
OUTPATIENT PHYSICAL THERAPY NEURO TREATMENT   Patient Name: Yvonne Jordan MRN: [id number] DOB:1946/05/27, 76 y.o., female Today's Date: 10/17/2022   PCP: Antony Contras, MD  REFERRING PROVIDER: Antony Contras, MD    PT End of Session - 10/17/22 1318     Visit Number 10    Number of Visits 16   17+5, recert   Date for PT Re-Evaluation 10/25/22    Authorization Type Medicare    Progress Note Due on Visit 10    PT Start Time 1315    PT Stop Time 1025    PT Time Calculation (min) 43 min    Equipment Utilized During Treatment Gait belt    Activity Tolerance Patient tolerated treatment well    Behavior During Therapy WFL for tasks assessed/performed                   Past Medical History:  Diagnosis Date   Allergic rhinitis    Arthritis    Cancer ([place])    Breast- left   CKD (chronic kidney disease)    stage III   Colon polyp    Dyspnea    GERD (gastroesophageal reflux disease)    History of hiatal hernia    Hyperlipidemia    Personal history of radiation therapy    Stroke [facility])    TIA before 2013   TIA (transient ischemic attack)    Vitamin D deficiency    Past Surgical History:  Procedure Laterality Date   APPENDECTOMY     BREAST LUMPECTOMY Left 2006   BREAST SURGERY     2006...treated with Tamoxifen for 6 years   CHOLECYSTECTOMY     COLONOSCOPY     ESOPHAGOGASTRODUODENOSCOPY N/A 09/01/2020   Procedure: ESOPHAGOGASTRODUODENOSCOPY (EGD);  Surgeon: Lajuana Matte, MD;  Location: [place];  Service: Thoracic;  Laterality: N/A;   XI ROBOTIC ASSISTED PARAESOPHAGEAL HERNIA REPAIR N/A 09/01/2020   Procedure: XI ROBOTIC ASSISTED HIATAL HERNIA REPAIR WITH FUNDOPLICATION AND GASTROPEXY;  Surgeon: Lajuana Matte, MD;  Location: [place];  Service: Thoracic;  Laterality: N/A;   Patient Active Problem List   Diagnosis Date Noted   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   Weakness 05/08/2022   Left foot drop 05/08/2022   Dysarthria 05/08/2022   Gait  abnormality 05/08/2022   S/P repair of paraesophageal hernia 09/01/2020   ILD (interstitial lung disease) ([place]) 07/21/2020   Pain in right knee 12/30/2018   Trochanteric bursitis of right hip 12/30/2018   Low back pain 04/19/2016   Abnormality of gait 03/29/2016   Fall 03/29/2016   Dyspnea 11/10/2014   GERD (gastroesophageal reflux disease) 11/10/2014   TIA (transient ischemic attack)    Vitamin D deficiency    Allergic rhinitis    CKD (chronic kidney disease)    Colon polyp    Hyperlipidemia     ONSET DATE: 07/31/2022   REFERRING DIAG: G12.20 (ICD-10-CM) - Motor neuron disease, unspecified   THERAPY DIAG:  Unsteadiness on feet  Muscle weakness (generalized)  Other abnormalities of gait and mobility  History of falling  Rationale for Evaluation and Treatment Rehabilitation   PERTINENT HISTORY: HLD, Left breast Cancer, s/p left lobectomy, radiation.Chronic kidney disease,  GERD,TIA- 20 years ago (right facial numbness, dysarthria) ,Hiatal Hernia surgery   PRECAUTIONS: Fall  PATIENT GOALS: "everything I have read about ALS says to work on ROM"   SUBJECTIVE STATEMENT: Pt reports no fall since last session, enters clinic using her rollator and reports she uses it for all of her  mobility now. Pt reports her husband did fall and she had to call EMS to help get him up from the floor, she went to get his slide board and then she herself fell (this was her fall prior to last PT session). Pt reports her husband transfers himself and he does his own bathing and dressing. Pt reports she does have someone come out to help with cleaning but is open to considering hiring a caregiver when she feels like she is at that point.  Pt has her visit with [place] [facility] Nov 6 & 7.   Pt accompanied by: self  PAIN:  Are you having pain? No   OBJECTIVE:   DIAGNOSTIC FINDINGS: EMG nerve conduction study May 30, 2022 showed evidence of widespread chronic neuropathic changes involving  right sternocleidomastoid, genioglossus, right cervical, bilateral lumbar sacral myotomes, also evidence of active denervation at mid and lower thoracic paraspinal muscles. Above findings most supportive of motor neuron disease    TODAY'S TREATMENT: Ther Ex  Supine thoracic ext stretch with arms OH Supine B shoulder flexion OH  Added to HEP, see bolded below   THER ACT:  [facility] PT Assessment - 10/17/22 1334       Ambulation/Gait   Gait velocity 32.8 ft over 15.88 sec = 2.07 ft/sec      Timed Up and Go Test   TUG Normal TUG    Normal TUG (seconds) 20.97   with rollator           Extensive education regarding importance of hiring a caregiver, educating EMS on how to safely transfer her husband vs trying to do it herself due to need to consider her energy conservation.   PATIENT EDUCATION: Education details: updated HEP, discussed PT POC Person educated: Patient Education method: Explanation and Handouts Education comprehension: verbalized understanding   HOME EXERCISE PROGRAM: Access Code: [email or web address] URL: [email or web address] Date: 08/22/2022 Prepared by: Excell Seltzer  Exercises - Supine Single Knee to Chest Stretch  - 1-2 x daily - 7 x weekly - 2 sets - 5 reps - 30-60 sec hold - Supine Figure 4 Piriformis Stretch  - 1-2 x daily - 7 x weekly - 2 sets - 5 reps - 30-60 sec hold - Long Sitting Calf Stretch and Hamstring Stretch with Strap  - 1-2 x daily - 7 x weekly - 2 sets - 5 reps - 30-60 sec hold - Supine Butterfly Groin Stretch  - 1-2 x daily - 7 x weekly - 2 sets - 5 reps - 30-60 sec hold - Wide Tandem Stance with Eyes Open  - 1 x daily - 7 x weekly - 1 sets - 5 reps - 30 sec hold - Supine Chin Tuck  - 1 x daily - 7 x weekly - 3 sets - 10 reps - Seated Upper Trapezius Stretch  - 1 x daily - 7 x weekly - 3 sets - 10 reps - Seated Posterior Shoulder Stretch  - 1 x daily - 7 x weekly - 3 sets - 10 reps - Seated Lumbar Flexion Stretch  - 1 x daily - 7 x  weekly - 3 sets - 10 reps - Seated Windmill Trunk Rotation Stretch  - 1 x daily - 7 x weekly - 3 sets - 10 reps - Supine Thoracic Mobilization Towel Roll Vertical with Arm Stretch  - 1 x daily - 7 x weekly - 3 sets - 10 reps - Supine Shoulder Flexion AAROM with Hands Clasped  - 1 x daily -  7 x weekly - 3 sets - 10 reps   GOALS: Goals reviewed with patient? Yes    NEW LONG TERM GOALS: Target date: 10/25/2022  Pt will be independent with final strengthening and balance HEP for improved strength, balance, transfers and gait. Baseline:  Goal status: IN PROGRESS  2.  Pt will improve gait velocity to at least 2.5 ft/sec for improved gait efficiency and performance at Independent level  Baseline: 2.4 ft/sec (8/14), 2.12 ft/sec (9/21), 2.07 ft/sec with rollator (10/25) Goal status: REVISED  3.  Pt will improve FGA to 12/30 for decreased fall risk  Baseline: 9/30 (9/22) Goal status: INITIAL    ASSESSMENT:  CLINICAL IMPRESSION: Emphasis of skilled PT session on reassessing STG of gait speed, adding to HEP for UB stretching, and discussing PT POC. Pt exhibits decreased gait speed from last assessment from 2.12 ft/sec to 2.07 ft/sec this date. Pt also exhibits increased time required to complete the normal TUG from 14.03 sec initially on 8/14 to 20.97 sec this date. Pt has her appointment at [place] [facility] coming up on Nov 6 and Nov 7. Scheduled one more PT visit following those appointments to follow up with patient about her PT POC going forwards and will decide at that point whether to d/c from pt or to get recertification to continue therapy services. Pt continues to exhibit overall functional decline as evidenced by her decrease in gait speed and increase in time needed to complete TUG due to the progressive nature of her diagnosis. Continue POC.     OBJECTIVE IMPAIRMENTS Abnormal gait, decreased balance, difficulty walking, decreased strength, dizziness, impaired perceived functional  ability, impaired sensation, impaired tone, and postural dysfunction.   ACTIVITY LIMITATIONS carrying, lifting, bending, standing, squatting, stairs, transfers, and caring for others  PARTICIPATION LIMITATIONS: meal prep, cleaning, laundry, driving, community activity, and church  PERSONAL FACTORS Age and 1-2 comorbidities:    HLD, Left breast Cancer, s/p left lobectomy, radiation, chronic kidney disease, GERD,TIA- 20 years ago (right facial numbness, dysarthria), and hiatal hernia surgery are also affecting patient's functional outcome.   REHAB POTENTIAL: Good  CLINICAL DECISION MAKING: Evolving/moderate complexity  EVALUATION COMPLEXITY: Moderate  PLAN: PT FREQUENCY: 2x/week  PT DURATION: 6 weeks+ 6 weeks (recert)  PLANNED INTERVENTIONS: Therapeutic exercises, Therapeutic activity, Neuromuscular re-education, Balance training, Gait training, Patient/Family education, Self Care, Joint mobilization, Stair training, Vestibular training, Canalith repositioning, Visual/preceptual remediation/compensation, Orthotic/Fit training, DME instructions, Aquatic Therapy, Electrical stimulation, Wheelchair mobility training, Cryotherapy, Moist heat, Taping, Manual therapy, and Re-evaluation  PLAN FOR NEXT SESSION:  Discuss PT POC (d/c vs recertification)    Excell Seltzer, PT, DPT, [facility] [address] [place], [place]  [address] Phone:  [phone] Fax:  [phone] 10/17/22, 1:59 PM

## 2022-10-22 ENCOUNTER — Ambulatory Visit: Payer: Medicare Other | Admitting: Physical Therapy

## 2022-10-25 DIAGNOSIS — M21372 Foot drop, left foot: Secondary | ICD-10-CM | POA: Diagnosis not present

## 2022-10-25 DIAGNOSIS — M85851 Other specified disorders of bone density and structure, right thigh: Secondary | ICD-10-CM | POA: Diagnosis not present

## 2022-10-25 DIAGNOSIS — E559 Vitamin D deficiency, unspecified: Secondary | ICD-10-CM | POA: Diagnosis not present

## 2022-10-25 DIAGNOSIS — I700 Atherosclerosis of aorta: Secondary | ICD-10-CM | POA: Diagnosis not present

## 2022-10-25 DIAGNOSIS — Z853 Personal history of malignant neoplasm of breast: Secondary | ICD-10-CM | POA: Diagnosis not present

## 2022-10-25 DIAGNOSIS — K8681 Exocrine pancreatic insufficiency: Secondary | ICD-10-CM | POA: Diagnosis not present

## 2022-10-25 DIAGNOSIS — N1831 Chronic kidney disease, stage 3a: Secondary | ICD-10-CM | POA: Diagnosis not present

## 2022-10-25 DIAGNOSIS — Z8673 Personal history of transient ischemic attack (TIA), and cerebral infarction without residual deficits: Secondary | ICD-10-CM | POA: Diagnosis not present

## 2022-10-25 DIAGNOSIS — J439 Emphysema, unspecified: Secondary | ICD-10-CM | POA: Diagnosis not present

## 2022-10-25 DIAGNOSIS — E7800 Pure hypercholesterolemia, unspecified: Secondary | ICD-10-CM | POA: Diagnosis not present

## 2022-10-25 DIAGNOSIS — G1220 Motor neuron disease, unspecified: Secondary | ICD-10-CM | POA: Diagnosis not present

## 2022-10-25 DIAGNOSIS — Z23 Encounter for immunization: Secondary | ICD-10-CM | POA: Diagnosis not present

## 2022-10-29 DIAGNOSIS — F482 Pseudobulbar affect: Secondary | ICD-10-CM | POA: Diagnosis not present

## 2022-10-29 DIAGNOSIS — G1221 Amyotrophic lateral sclerosis: Secondary | ICD-10-CM | POA: Diagnosis not present

## 2022-10-30 DIAGNOSIS — G1221 Amyotrophic lateral sclerosis: Secondary | ICD-10-CM | POA: Diagnosis not present

## 2022-10-30 DIAGNOSIS — F482 Pseudobulbar affect: Secondary | ICD-10-CM | POA: Diagnosis not present

## 2022-10-30 DIAGNOSIS — R1310 Dysphagia, unspecified: Secondary | ICD-10-CM | POA: Diagnosis not present

## 2022-10-30 DIAGNOSIS — R471 Dysarthria and anarthria: Secondary | ICD-10-CM | POA: Diagnosis not present

## 2022-10-30 DIAGNOSIS — Z5321 Procedure and treatment not carried out due to patient leaving prior to being seen by health care provider: Secondary | ICD-10-CM | POA: Diagnosis not present

## 2022-10-30 DIAGNOSIS — R262 Difficulty in walking, not elsewhere classified: Secondary | ICD-10-CM | POA: Diagnosis not present

## 2022-10-30 DIAGNOSIS — R1312 Dysphagia, oropharyngeal phase: Secondary | ICD-10-CM | POA: Diagnosis not present

## 2022-10-31 ENCOUNTER — Encounter: Payer: Self-pay | Admitting: Speech Pathology

## 2022-10-31 ENCOUNTER — Ambulatory Visit: Payer: Medicare Other | Attending: Family Medicine | Admitting: Physical Therapy

## 2022-10-31 ENCOUNTER — Other Ambulatory Visit (HOSPITAL_COMMUNITY): Payer: Self-pay

## 2022-10-31 ENCOUNTER — Telehealth: Payer: Self-pay | Admitting: Speech Pathology

## 2022-10-31 DIAGNOSIS — R2681 Unsteadiness on feet: Secondary | ICD-10-CM

## 2022-10-31 DIAGNOSIS — R2689 Other abnormalities of gait and mobility: Secondary | ICD-10-CM | POA: Diagnosis not present

## 2022-10-31 DIAGNOSIS — R059 Cough, unspecified: Secondary | ICD-10-CM

## 2022-10-31 DIAGNOSIS — R1312 Dysphagia, oropharyngeal phase: Secondary | ICD-10-CM

## 2022-10-31 DIAGNOSIS — Z9181 History of falling: Secondary | ICD-10-CM

## 2022-10-31 DIAGNOSIS — R1310 Dysphagia, unspecified: Secondary | ICD-10-CM

## 2022-10-31 DIAGNOSIS — M6281 Muscle weakness (generalized): Secondary | ICD-10-CM

## 2022-10-31 NOTE — Telephone Encounter (Signed)
Spoke with Anne Ng and reviewed note from [place] [facility]. [facility] is recommending repeat MBSS at [facility]. I placed the order. Acute rehab will call her to schedule.

## 2022-10-31 NOTE — Therapy (Signed)
OUTPATIENT PHYSICAL THERAPY NEURO TREATMENT-RECERT   Patient Name: Yvonne Jordan MRN: [id number] DOB:30-Nov-1946, 76 y.o., female Today's Date: 10/31/2022   PCP: Antony Contras, MD  REFERRING PROVIDER: Antony Contras, MD      PT End of Session - 10/31/22 1447     Visit Number 11    Number of Visits 16   38+7, recert   Date for PT Re-Evaluation 56/43/32   recert, to allow for scheduling conflicts   Authorization Type Medicare    Progress Note Due on Visit 20    PT Start Time 1445    PT Stop Time 1523    PT Time Calculation (min) 38 min    Equipment Utilized During Treatment Gait belt    Activity Tolerance Patient tolerated treatment well    Behavior During Therapy WFL for tasks assessed/performed                    Past Medical History:  Diagnosis Date   Allergic rhinitis    Arthritis    Cancer ([place])    Breast- left   CKD (chronic kidney disease)    stage III   Colon polyp    Dyspnea    GERD (gastroesophageal reflux disease)    History of hiatal hernia    Hyperlipidemia    Personal history of radiation therapy    Stroke [facility])    TIA before 2013   TIA (transient ischemic attack)    Vitamin D deficiency    Past Surgical History:  Procedure Laterality Date   APPENDECTOMY     BREAST LUMPECTOMY Left 2006   BREAST SURGERY     2006...treated with Tamoxifen for 6 years   CHOLECYSTECTOMY     COLONOSCOPY     ESOPHAGOGASTRODUODENOSCOPY N/A 09/01/2020   Procedure: ESOPHAGOGASTRODUODENOSCOPY (EGD);  Surgeon: Lajuana Matte, MD;  Location: [place];  Service: Thoracic;  Laterality: N/A;   XI ROBOTIC ASSISTED PARAESOPHAGEAL HERNIA REPAIR N/A 09/01/2020   Procedure: XI ROBOTIC ASSISTED HIATAL HERNIA REPAIR WITH FUNDOPLICATION AND GASTROPEXY;  Surgeon: Lajuana Matte, MD;  Location: [place];  Service: Thoracic;  Laterality: N/A;   Patient Active Problem List   Diagnosis Date Noted   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   Weakness 05/08/2022   Left foot  drop 05/08/2022   Dysarthria 05/08/2022   Gait abnormality 05/08/2022   S/P repair of paraesophageal hernia 09/01/2020   ILD (interstitial lung disease) ([place]) 07/21/2020   Pain in right knee 12/30/2018   Trochanteric bursitis of right hip 12/30/2018   Low back pain 04/19/2016   Abnormality of gait 03/29/2016   Fall 03/29/2016   Dyspnea 11/10/2014   GERD (gastroesophageal reflux disease) 11/10/2014   TIA (transient ischemic attack)    Vitamin D deficiency    Allergic rhinitis    CKD (chronic kidney disease)    Colon polyp    Hyperlipidemia     ONSET DATE: 07/31/2022   REFERRING DIAG: G12.20 (ICD-10-CM) - Motor neuron disease, unspecified   THERAPY DIAG:  Unsteadiness on feet  Muscle weakness (generalized)  Other abnormalities of gait and mobility  History of falling  Rationale for Evaluation and Treatment Rehabilitation   PERTINENT HISTORY: HLD, Left breast Cancer, s/p left lobectomy, radiation.Chronic kidney disease,  GERD,TIA- 20 years ago (right facial numbness, dysarthria) ,Hiatal Hernia surgery   PRECAUTIONS: Fall  PATIENT GOALS: "everything I have read about ALS says to work on ROM"   SUBJECTIVE STATEMENT: Pt reports no falls since last visit, still using her rollator.  Pt had her visit with [place] clinic, is going to have another swallow study done.  Pt reports difficulty navigating curbs so tries to find a ramp she can use. Pt reports ongoing tightness in her neck, no pain today.   Pt has her visit with [place] clinic Nov 6 & 7. Follow-up Dec 12 and Jan 12.   Pt accompanied by: self  PAIN:  Are you having pain? No   OBJECTIVE:   DIAGNOSTIC FINDINGS: EMG nerve conduction study May 30, 2022 showed evidence of widespread chronic neuropathic changes involving right sternocleidomastoid, genioglossus, right cervical, bilateral lumbar sacral myotomes, also evidence of active denervation at mid and lower thoracic paraspinal muscles. Above findings most  supportive of motor neuron disease    TODAY'S TREATMENT: Ther Ex  ***  THER ACT: *** Supine thoracic mobilization on towel roll  GAIT: Gait pattern: {gait characteristics:25376} Distance walked: *** Assistive device utilized: {Assistive devices:23999} Level of assistance: {Levels of assistance:24026} Comments: indoors across level surface  Gait pattern: {gait characteristics:25376} Distance walked: *** Assistive device utilized: {Assistive devices:23999} Level of assistance: {Levels of assistance:24026} Comments: outdoors across uneven surface   MANUAL: Suboccipital release 4 x 30-45 sec each Passive L/R UT stretch with tightness in R side 4 x 30 sec each    PATIENT EDUCATION: Education details: updated HEP, discussed PT POC*** Person educated: Patient Education method: Explanation and Handouts Education comprehension: verbalized understanding   HOME EXERCISE PROGRAM: Access Code: [email or web address] URL: [email or web address] Date: 08/22/2022 Prepared by: Excell Seltzer  Exercises - Supine Single Knee to Chest Stretch  - 1-2 x daily - 7 x weekly - 2 sets - 5 reps - 30-60 sec hold - Supine Figure 4 Piriformis Stretch  - 1-2 x daily - 7 x weekly - 2 sets - 5 reps - 30-60 sec hold - Long Sitting Calf Stretch and Hamstring Stretch with Strap  - 1-2 x daily - 7 x weekly - 2 sets - 5 reps - 30-60 sec hold - Supine Butterfly Groin Stretch  - 1-2 x daily - 7 x weekly - 2 sets - 5 reps - 30-60 sec hold - Wide Tandem Stance with Eyes Open  - 1 x daily - 7 x weekly - 1 sets - 5 reps - 30 sec hold - Supine Chin Tuck  - 1 x daily - 7 x weekly - 3 sets - 10 reps - Seated Upper Trapezius Stretch  - 1 x daily - 7 x weekly - 3 sets - 10 reps - Seated Posterior Shoulder Stretch  - 1 x daily - 7 x weekly - 3 sets - 10 reps - Seated Lumbar Flexion Stretch  - 1 x daily - 7 x weekly - 3 sets - 10 reps - Seated Windmill Trunk Rotation Stretch  - 1 x daily - 7 x weekly - 3 sets - 10  reps - Supine Thoracic Mobilization Towel Roll Vertical with Arm Stretch  - 1 x daily - 7 x weekly - 3 sets - 10 reps - Supine Shoulder Flexion AAROM with Hands Clasped  - 1 x daily - 7 x weekly - 3 sets - 10 reps   GOALS: Goals reviewed with patient? Yes    NEW LONG TERM GOALS: Target date: 10/25/2022***  Pt will be independent with final strengthening and balance HEP for improved strength, balance, transfers and gait. Baseline:  Goal status: IN PROGRESS  2.  Pt will improve gait velocity to at least 2.5 ft/sec for improved gait efficiency and  performance at Independent level  Baseline: 2.4 ft/sec (8/14), 2.12 ft/sec (9/21), 2.07 ft/sec with rollator (10/25) Goal status: REVISED  3.  Pt will improve FGA to 12/30 for decreased fall risk  Baseline: 9/30 (9/22) Goal status: INITIAL   NEW LONG TERM GOALS: Target date: ***  Pt will be independent with final strengthening and balance HEP for improved strength, balance, transfers and gait. Baseline:  Goal status: IN PROGRESS  2.  Pt will improve gait velocity to at least 2.5 ft/sec for improved gait efficiency and performance at Independent level  Baseline: 2.4 ft/sec (8/14), 2.12 ft/sec (9/21), 2.07 ft/sec with rollator (10/25) Goal status: REVISED  3.  Pt will improve FGA to 12/30 for decreased fall risk  Baseline: 9/30 (9/22) Goal status: INITIAL   ASSESSMENT:  CLINICAL IMPRESSION: Emphasis of skilled PT session on reassessing STG of gait speed, adding to HEP for UB stretching, and discussing PT POC. Pt exhibits decreased gait speed from last assessment from 2.12 ft/sec to 2.07 ft/sec this date. Pt also exhibits increased time required to complete the normal TUG from 14.03 sec initially on 8/14 to 20.97 sec this date. Pt has her appointment at [place] [facility] coming up on Nov 6 and Nov 7. Scheduled one more PT visit following those appointments to follow up with patient about her PT POC going forwards and will decide at that  point whether to d/c from pt or to get recertification to continue therapy services. Pt continues to exhibit overall functional decline as evidenced by her decrease in gait speed and increase in time needed to complete TUG due to the progressive nature of her diagnosis. Continue POC.   Emphasis of skilled PT session*** Continue POC.     OBJECTIVE IMPAIRMENTS Abnormal gait, decreased balance, difficulty walking, decreased strength, dizziness, impaired perceived functional ability, impaired sensation, impaired tone, and postural dysfunction.   ACTIVITY LIMITATIONS carrying, lifting, bending, standing, squatting, stairs, transfers, and caring for others  PARTICIPATION LIMITATIONS: meal prep, cleaning, laundry, driving, community activity, and church  PERSONAL FACTORS Age and 1-2 comorbidities:    HLD, Left breast Cancer, s/p left lobectomy, radiation, chronic kidney disease, GERD,TIA- 20 years ago (right facial numbness, dysarthria), and hiatal hernia surgery are also affecting patient's functional outcome.   REHAB POTENTIAL: Good  CLINICAL DECISION MAKING: Evolving/moderate complexity  EVALUATION COMPLEXITY: Moderate  PLAN: PT FREQUENCY: 2x/week  PT DURATION: 6 weeks+ 6 weeks (recert)  PLANNED INTERVENTIONS: Therapeutic exercises, Therapeutic activity, Neuromuscular re-education, Balance training, Gait training, Patient/Family education, Self Care, Joint mobilization, Stair training, Vestibular training, Canalith repositioning, Visual/preceptual remediation/compensation, Orthotic/Fit training, DME instructions, Aquatic Therapy, Electrical stimulation, Wheelchair mobility training, Cryotherapy, Moist heat, Taping, Manual therapy, and Re-evaluation  PLAN FOR NEXT SESSION:  Discuss PT POC (d/c vs recertification)***    Excell Seltzer, PT, DPT, [facility] [address] [place], [place]  [address] Phone:  [phone] Fax:  [phone] 10/31/22, 3:26  PM

## 2022-11-08 ENCOUNTER — Ambulatory Visit: Payer: Medicare Other | Admitting: Occupational Therapy

## 2022-11-08 NOTE — Therapy (Deleted)
OUTPATIENT OCCUPATIONAL THERAPY NEURO EVALUATION  Patient Name: Makylee Sanborn MRN: [id number] DOB:30-Jan-1946, 76 y.o., female Today's Date: 11/08/2022  PCP: Antony Contras, MD  REFERRING PROVIDER:  Antony Contras, MD    OT End of Session - 09/13/22 1333     Visit Number 1    Number of Visits 1   Date for OT Re-Evaluation 11/13/22    Authorization Type Medicare    OT Start Time 1235    OT Stop Time 1320    OT Time Calculation (min) 45 min    Behavior During Therapy [facility] for tasks assessed/performed             Past Medical History:  Diagnosis Date   Allergic rhinitis    Arthritis    Cancer ([place])    Breast- left   CKD (chronic kidney disease)    stage III   Colon polyp    Dyspnea    GERD (gastroesophageal reflux disease)    History of hiatal hernia    Hyperlipidemia    Personal history of radiation therapy    Stroke [facility])    TIA before 2013   TIA (transient ischemic attack)    Vitamin D deficiency    Past Surgical History:  Procedure Laterality Date   APPENDECTOMY     BREAST LUMPECTOMY Left 2006   BREAST SURGERY     2006...treated with Tamoxifen for 6 years   CHOLECYSTECTOMY     COLONOSCOPY     ESOPHAGOGASTRODUODENOSCOPY N/A 09/01/2020   Procedure: ESOPHAGOGASTRODUODENOSCOPY (EGD);  Surgeon: Lajuana Matte, MD;  Location: [place];  Service: Thoracic;  Laterality: N/A;   XI ROBOTIC ASSISTED PARAESOPHAGEAL HERNIA REPAIR N/A 09/01/2020   Procedure: XI ROBOTIC ASSISTED HIATAL HERNIA REPAIR WITH FUNDOPLICATION AND GASTROPEXY;  Surgeon: Lajuana Matte, MD;  Location: [place];  Service: Thoracic;  Laterality: N/A;   Patient Active Problem List   Diagnosis Date Noted   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   Weakness 05/08/2022   Left foot drop 05/08/2022   Dysarthria 05/08/2022   Gait abnormality 05/08/2022   S/P repair of paraesophageal hernia 09/01/2020   ILD (interstitial lung disease) ([place]) 07/21/2020   Pain in right knee 12/30/2018    Trochanteric bursitis of right hip 12/30/2018   Low back pain 04/19/2016   Abnormality of gait 03/29/2016   Fall 03/29/2016   Dyspnea 11/10/2014   GERD (gastroesophageal reflux disease) 11/10/2014   TIA (transient ischemic attack)    Vitamin D deficiency    Allergic rhinitis    CKD (chronic kidney disease)    Colon polyp    Hyperlipidemia     ONSET DATE: 07/31/2022     REFERRING DIAG: G12.20 (ICD-10-CM) - Motor neuron disease, unspecified   THERAPY DIAG:  No diagnosis found.  Rationale for Evaluation and Treatment Rehabilitation  SUBJECTIVE:   SUBJECTIVE STATEMENT: It has not affected my arms yet.  Pt accompanied by: self  PERTINENT HISTORY: HLD, Left breast Cancer, s/p left lobectomy, radiation.Chronic kidney disease,  GERD,TIA- 20 years ago (right facial numbness, dysarthria) ,Hiatal Hernia surgery, EPI  PRECAUTIONS: Fall  WEIGHT BEARING RESTRICTIONS No  PAIN:  Are you having pain? No  FALLS: Has patient fallen in last 6 months? Yes. Number of falls 3  LIVING ENVIRONMENT: Lives with: lives with their spouse Lives in: House/apartment   PLOF: Independent with basic ADLs  PATIENT GOALS Wants to reduce falls  OBJECTIVE:   HAND DOMINANCE: Left  ADLs: Overall ADLs: Independent Transfers/ambulation related to ADLs: Uses rollator in her  home when she takes off her AFO Eating: Independent Grooming: Independent UB Dressing: Independent LB Dressing: Independent Toileting: Independent Bathing: Modified Independent - uses grab bars Tub Shower transfers: Mod I - recommend patient consider tub transfer bench - patient in agreement Equipment: rollator, cane   IADLs: Shopping: Mod I Light housekeeping: Bedmaking Meal Prep: Prepares full meals Community mobility: Independent Medication management: Tourist information centre manager: Independent Handwriting: 100% legible  MOBILITY STATUS: Hx of falls  POSTURE COMMENTS:  No Significant postural  limitations Sitting balance:  WFL  ACTIVITY TOLERANCE: Activity tolerance: WFL    UPPER EXTREMITY ROM     Active ROM Right eval Left eval  Shoulder flexion [facility]  Shoulder abduction THRUOUT THRUOUT  Shoulder adduction    Shoulder extension    Shoulder internal rotation    Shoulder external rotation    Elbow flexion    Elbow extension    Wrist flexion    Wrist extension    Wrist ulnar deviation    Wrist radial deviation    Wrist pronation    Wrist supination    (Blank rows = not tested)   UPPER EXTREMITY MMT:     MMT Right eval Left eval  Shoulder flexion 4+/5 4+/5  Shoulder abduction THRUOUT THRUOUT  Shoulder adduction    Shoulder extension    Shoulder internal rotation    Shoulder external rotation    Middle trapezius    Lower trapezius    Elbow flexion    Elbow extension    Wrist flexion    Wrist extension    Wrist ulnar deviation    Wrist radial deviation    Wrist pronation    Wrist supination    (Blank rows = not tested)  HAND FUNCTION: Grip strength: Right: 45.4 lbs; Left: 46.9 lbs and Lateral pinch: Right: NT lbs, Left: NT lbs  COORDINATION: 9 Hole Peg test: Right: 28.63 sec; Left: 28.63 sec  SENSATION: WFL    MUSCLE TONE: RUE: Within functional limits and LUE: Within functional limits  COGNITION: Overall cognitive status: Within functional limits for tasks assessed   VISION ASSESSMENT: Not tested     TODAY'S TREATMENT:  Patient here for OT evaluation today.  Patient reports that she is completing ADL/IADL without assistance at this time - although has frequent falls.  Patient actively receiving PT at this time.  Will reassess OT needs in 2 months.   Discussed potential need for tub transfer bench - patient will investigate.     PATIENT EDUCATION: Education details: Tub equipment Person educated: Patient Education method: Consulting civil engineer and given Market researcher on line Education comprehension: verbalized  understanding   HOME EXERCISE PROGRAM: NA     ASSESSMENT:  CLINICAL IMPRESSION: Patient is a 76 y.o. female who was seen today for occupational therapy evaluation for motor neuro disease. Patient reports LLE weakness and frequent falls. Patient has been referred to [facility] - and has appointments scheduled for November.  Will reassess OT needs as warranted in 2 months.  Patient with adequate upper body strength and independence with BADL/IADL at this time.    PERFORMANCE DEFICITS in functional skills including strength  IMPAIRMENTS are leading to frequent falls.   COMORBIDITIES may have co-morbidities  that affects occupational performance. Patient will benefit from skilled OT to address above impairments and improve overall function.  MODIFICATION OR ASSISTANCE TO COMPLETE EVALUATION: No modification of tasks or assist necessary to complete an evaluation.  OT OCCUPATIONAL PROFILE AND HISTORY: Detailed assessment: Review of records and additional review of  physical, cognitive, psychosocial history related to current functional performance.  CLINICAL DECISION MAKING: Moderate - several treatment options, min-mod task modification necessary  REHAB POTENTIAL: Good  EVALUATION COMPLEXITY: Moderate    PLAN: OT FREQUENCY:  Reassess in 8 weeks/ 2 months   PLAN FOR NEXT SESSION: reassess OT needs if patient experiences disease progression   Rinoa Garramone, OT 11/08/2022, 8:44 AM

## 2022-11-12 ENCOUNTER — Ambulatory Visit: Payer: Medicare Other | Admitting: Physical Therapy

## 2022-11-13 ENCOUNTER — Ambulatory Visit (HOSPITAL_COMMUNITY)
Admission: RE | Admit: 2022-11-13 | Discharge: 2022-11-13 | Disposition: A | Discharge status: Home / Self Care | Payer: Medicare Other | Source: Ambulatory Visit | Attending: Family Medicine | Admitting: Family Medicine

## 2022-11-13 ENCOUNTER — Ambulatory Visit (HOSPITAL_COMMUNITY): Payer: Medicare Other

## 2022-11-13 ENCOUNTER — Ambulatory Visit (HOSPITAL_COMMUNITY)
Admission: RE | Admit: 2022-11-13 | Discharge: 2022-11-13 | Disposition: A | Discharge status: Home / Self Care | Payer: Medicare Other | Source: Ambulatory Visit

## 2022-11-13 ENCOUNTER — Encounter (HOSPITAL_COMMUNITY): Payer: Self-pay

## 2022-11-13 DIAGNOSIS — R059 Cough, unspecified: Secondary | ICD-10-CM | POA: Diagnosis not present

## 2022-11-13 DIAGNOSIS — R1310 Dysphagia, unspecified: Secondary | ICD-10-CM | POA: Diagnosis not present

## 2022-11-13 DIAGNOSIS — R1312 Dysphagia, oropharyngeal phase: Secondary | ICD-10-CM | POA: Insufficient documentation

## 2022-11-13 NOTE — Progress Notes (Signed)
Objective Swallowing Evaluation: Type of Study: MBS-Modified Barium Swallow Study   Patient Details  Name: Yvonne Jordan MRN: [id number] Date of Birth: [birth year]  Today's Date: 11/13/2022 Time: SLP Start Time (ACUTE ONLY): 1120 -SLP Stop Time (ACUTE ONLY): 2778  SLP Time Calculation (min) (ACUTE ONLY): 35 min   Past Medical History:  Past Medical History:  Diagnosis Date   Allergic rhinitis    Arthritis    Cancer ([place])    Breast- left   CKD (chronic kidney disease)    stage III   Colon polyp    Dyspnea    GERD (gastroesophageal reflux disease)    History of hiatal hernia    Hyperlipidemia    Personal history of radiation therapy    Stroke [facility])    TIA before 2013   TIA (transient ischemic attack)    Vitamin D deficiency    Past Surgical History:  Past Surgical History:  Procedure Laterality Date   APPENDECTOMY     BREAST LUMPECTOMY Left 2006   BREAST SURGERY     2006...treated with Tamoxifen for 6 years   CHOLECYSTECTOMY     COLONOSCOPY     ESOPHAGOGASTRODUODENOSCOPY N/A 09/01/2020   Procedure: ESOPHAGOGASTRODUODENOSCOPY (EGD);  Surgeon: Lajuana Matte, MD;  Location: [facility];  Service: Thoracic;  Laterality: N/A;   XI ROBOTIC ASSISTED PARAESOPHAGEAL HERNIA REPAIR N/A 09/01/2020   Procedure: XI ROBOTIC ASSISTED HIATAL HERNIA REPAIR WITH FUNDOPLICATION AND GASTROPEXY;  Surgeon: Lajuana Matte, MD;  Location: [place];  Service: Thoracic;  Laterality: N/A;   HPI: Ms. 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 Seen by SLP on 10/30/22.  Clinical swallowing assessment revealed: failed 90 ml water test. Motor speech exam revealed reduced respiratory support and articulatory precision. She underwent an OP MBS at [facility] on 7/77/23: "mild oropharyngeal dysphagia c/b mild delays in oral preparation and delay in onset of the pharyngeal swallow.  Thin liquids consistently  entered the laryngeal vestibule before swallow onset; however there was reliable arytenoid-to-base of epiglottis contact, which served to eject most of the bolus from the larynx.  No aspiration was viewed over multiple swallows of thin liquids and sequential swallows. There was effective pharyngeal clearance." She endorses further difficulty swallowing and must cut food to smaller pieces. Has occasional cough and choking events. They are infrequent - not daily - but when they occur they are intense and prolonged. She has had 10 pounds weight loss recently. Previously she lost 50 pounds after the hiatal hernia repair surgery in 2021. She returns to [facility] today for a repeat MBS study and will f/u with [facility] OP SLP services as well as [facility].   Subjective: alert    Recommendations for follow up therapy are one component of a multi-disciplinary discharge planning process, led by the attending physician.  Recommendations may be updated based on patient status, additional functional criteria and insurance authorization.  Assessment / Plan / Recommendation     11/13/2022    2:00 PM  Clinical Impressions  Clinical Impression Pt presents with mild changes in the oral phase of the swallow since last MBS in July. There are deficits in bolus transport/lingual motion, most notable with solid foods. She reports labial spillage but this was not observed during today's study. There are persisting mild deficits in the pharyngeal phase with decreased mobility of laryngeal complex and incomplete laryngeal vestibule closure. These deficits  are consistent with performance in July, and lead to consistent penetration of thin and nectar thick liquids.  Penetrant was ejected upon completion of the swallow (PAS 2). There was no observed aspiration. There was normal pharyngeal stripping and PES opening.  She is certainly presdisposed to aspiration events during meals, when fatigued,  and if consuming mixed solid/liquid  consistencies. Recommend continued f/u with OP SLP to address dysarthria, RMST, education re: avoiding mixed consistencies, bolus size, potential brief oral hold prior to swallow. Otherwise continue regular solids and thin liquids.         11/13/2022    2:00 PM  Treatment Recommendations  Treatment Recommendations Defer treatment plan to f/u with SLP         No data to display             11/13/2022    2:00 PM  Diet Recommendations  SLP Diet Recommendations Regular solids;Thin liquid  Liquid Administration via Cup;Straw  Medication Administration Whole meds with liquid  Compensations Small sips/bites;Other (Comment)         11/13/2022    2:00 PM  Other Recommendations  Oral Care Recommendations Oral care BID  Follow Up Recommendations Outpatient SLP  Functional Status Assessment Patient has had a recent decline in their functional status and demonstrates the ability to make significant improvements in function in a reasonable and predictable amount of time.        No data to display               11/13/2022    2:00 PM  Oral Phase  Oral Phase Impaired  Oral - Nectar Cup Weak lingual manipulation;Lingual pumping;Reduced posterior propulsion;Decreased bolus cohesion  Oral - Thin Teaspoon Weak lingual manipulation;Lingual pumping;Decreased bolus cohesion  Oral - Thin Cup Weak lingual manipulation;Lingual pumping;Decreased bolus cohesion  Oral - Puree Weak lingual manipulation;Lingual pumping;Reduced posterior propulsion;Decreased bolus cohesion  Oral - Regular Weak lingual manipulation;Lingual pumping;Reduced posterior propulsion;Decreased bolus cohesion       11/13/2022    2:00 PM  Pharyngeal Phase  Pharyngeal Phase Impaired  Pharyngeal- Nectar Cup Delayed swallow initiation-pyriform sinuses;Reduced epiglottic inversion;Reduced anterior laryngeal mobility;Reduced laryngeal elevation;Reduced airway/laryngeal closure;Penetration/Aspiration before swallow;WFL   Pharyngeal- Thin Teaspoon Delayed swallow initiation-pyriform sinuses;Reduced epiglottic inversion;Reduced anterior laryngeal mobility;Reduced laryngeal elevation;WFL  Pharyngeal- Thin Cup Delayed swallow initiation-pyriform sinuses;Reduced epiglottic inversion;Reduced anterior laryngeal mobility;Reduced laryngeal elevation;Reduced airway/laryngeal closure;Penetration/Aspiration before swallow;WFL  Pharyngeal Material enters airway, remains ABOVE vocal cords then ejected out  Pharyngeal- Puree Delayed swallow initiation-pyriform sinuses;Reduced epiglottic inversion;Reduced anterior laryngeal mobility;Reduced laryngeal elevation;Reduced airway/laryngeal closure;WFL  Pharyngeal- Regular Delayed swallow initiation-pyriform sinuses;Reduced epiglottic inversion;Reduced anterior laryngeal mobility;Reduced laryngeal elevation;Reduced airway/laryngeal closure;WFL        07/17/2022    3:00 PM  Cervical Esophageal Phase   Cervical Esophageal Phase WFL     [name] 11/13/2022, 3:33 PM   Isela Stantz [name], MA CCC/SLP Clinical Specialist - [place] Office number [phone]

## 2022-11-19 ENCOUNTER — Ambulatory Visit: Payer: Medicare Other | Admitting: Physical Therapy

## 2022-11-19 NOTE — Therapy (Incomplete)
OUTPATIENT PHYSICAL THERAPY NEURO TREATMENT   Patient Name: Yvonne Jordan MRN: [id number] DOB:[birth year], 76 y.o., female Today's Date: 11/19/2022   PCP: Antony Contras, MD  REFERRING PROVIDER: Antony Contras, MD               Past Medical History:  Diagnosis Date   Allergic rhinitis    Arthritis    Cancer ([place])    Breast- left   CKD (chronic kidney disease)    stage III   Colon polyp    Dyspnea    GERD (gastroesophageal reflux disease)    History of hiatal hernia    Hyperlipidemia    Personal history of radiation therapy    Stroke [facility])    TIA before 2013   TIA (transient ischemic attack)    Vitamin D deficiency    Past Surgical History:  Procedure Laterality Date   APPENDECTOMY     BREAST LUMPECTOMY Left 2006   BREAST SURGERY     2006...treated with Tamoxifen for 6 years   CHOLECYSTECTOMY     COLONOSCOPY     ESOPHAGOGASTRODUODENOSCOPY N/A 09/01/2020   Procedure: ESOPHAGOGASTRODUODENOSCOPY (EGD);  Surgeon: Lajuana Matte, MD;  Location: [place];  Service: Thoracic;  Laterality: N/A;   XI ROBOTIC ASSISTED PARAESOPHAGEAL HERNIA REPAIR N/A 09/01/2020   Procedure: XI ROBOTIC ASSISTED HIATAL HERNIA REPAIR WITH FUNDOPLICATION AND GASTROPEXY;  Surgeon: Lajuana Matte, MD;  Location: [place];  Service: Thoracic;  Laterality: N/A;   Patient Active Problem List   Diagnosis Date Noted   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   Weakness 05/08/2022   Left foot drop 05/08/2022   Dysarthria 05/08/2022   Gait abnormality 05/08/2022   S/P repair of paraesophageal hernia 09/01/2020   ILD (interstitial lung disease) ([place]) 07/21/2020   Pain in right knee 12/30/2018   Trochanteric bursitis of right hip 12/30/2018   Low back pain 04/19/2016   Abnormality of gait 03/29/2016   Fall 03/29/2016   Dyspnea 11/10/2014   GERD (gastroesophageal reflux disease) 11/10/2014   TIA (transient ischemic attack)    Vitamin D deficiency    Allergic rhinitis    CKD (chronic  kidney disease)    Colon polyp    Hyperlipidemia     ONSET DATE: 07/31/2022   REFERRING DIAG: G12.20 (ICD-10-CM) - Motor neuron disease, unspecified   THERAPY DIAG:  No diagnosis found.  Rationale for Evaluation and Treatment Rehabilitation   PERTINENT HISTORY: HLD, Left breast Cancer, s/p left lobectomy, radiation.Chronic kidney disease,  GERD,TIA- 20 years ago (right facial numbness, dysarthria) ,Hiatal Hernia surgery   PRECAUTIONS: Fall  PATIENT GOALS: "everything I have read about ALS says to work on ROM"   SUBJECTIVE STATEMENT: ***  Pt had her visit with [place] [facility] Nov 6 & 7. Follow-up Dec 12 and Jan 12.   Pt accompanied by: self  PAIN:  Are you having pain? No   OBJECTIVE:   DIAGNOSTIC FINDINGS: EMG nerve conduction study May 30, 2022 showed evidence of widespread chronic neuropathic changes involving right sternocleidomastoid, genioglossus, right cervical, bilateral lumbar sacral myotomes, also evidence of active denervation at mid and lower thoracic paraspinal muscles. Above findings most supportive of motor neuron disease    TODAY'S TREATMENT: Ther Ex  ***  GAIT: ***   MANUAL: ***   PATIENT EDUCATION: Education details: discussed PT POC*** Person educated: Patient Education method: Explanation Education comprehension: verbalized understanding   HOME EXERCISE PROGRAM: Access Code: [email or web address] URL: [email or web address] Date: 08/22/2022 Prepared by: Excell Seltzer  Exercises - Supine Single  Knee to Chest Stretch  - 1-2 x daily - 7 x weekly - 2 sets - 5 reps - 30-60 sec hold - Supine Figure 4 Piriformis Stretch  - 1-2 x daily - 7 x weekly - 2 sets - 5 reps - 30-60 sec hold - Long Sitting Calf Stretch and Hamstring Stretch with Strap  - 1-2 x daily - 7 x weekly - 2 sets - 5 reps - 30-60 sec hold - Supine Butterfly Groin Stretch  - 1-2 x daily - 7 x weekly - 2 sets - 5 reps - 30-60 sec hold - Wide Tandem Stance with Eyes Open   - 1 x daily - 7 x weekly - 1 sets - 5 reps - 30 sec hold - Supine Chin Tuck  - 1 x daily - 7 x weekly - 3 sets - 10 reps - Seated Upper Trapezius Stretch  - 1 x daily - 7 x weekly - 3 sets - 10 reps - Seated Posterior Shoulder Stretch  - 1 x daily - 7 x weekly - 3 sets - 10 reps - Seated Lumbar Flexion Stretch  - 1 x daily - 7 x weekly - 3 sets - 10 reps - Seated Windmill Trunk Rotation Stretch  - 1 x daily - 7 x weekly - 3 sets - 10 reps - Supine Thoracic Mobilization Towel Roll Vertical with Arm Stretch  - 1 x daily - 7 x weekly - 3 sets - 10 reps - Supine Shoulder Flexion AAROM with Hands Clasped  - 1 x daily - 7 x weekly - 3 sets - 10 reps   GOALS: Goals reviewed with patient? Yes  LONG TERM GOALS: Target date: 10/25/2022  Pt will be independent with final strengthening and balance HEP for improved strength, balance, transfers and gait. Baseline:  Goal status: MET  2.  Pt will improve gait velocity to at least 2.5 ft/sec for improved gait efficiency and performance at Independent level  Baseline: 2.4 ft/sec (8/14), 2.12 ft/sec (9/21), 2.07 ft/sec with rollator (10/25) Goal status: IN PROGRESS  3.  Pt will improve FGA to 12/30 for decreased fall risk  Baseline: 9/30 (9/22) Goal status: IN PROGRESS   NEW LONG TERM GOALS: Target date: 12/12/2022  1.  Pt will improve gait velocity to at least 2.5 ft/sec for improved gait efficiency and performance at Independent level  Baseline: 2.4 ft/sec (8/14), 2.12 ft/sec (9/21), 2.07 ft/sec with rollator (10/25) Goal status: REVISED  2.  Pt will improve FGA to 12/30 for decreased fall risk  Baseline: 9/30 (9/22) Goal status: INITIAL   ASSESSMENT:  CLINICAL IMPRESSION: Emphasis of skilled PT session on ***  Pt continues to benefit from skilled therapy services to continue education regarding her diagnosis and how to ease her rate of functional decline. Continue POC.  OBJECTIVE IMPAIRMENTS Abnormal gait, decreased balance, difficulty  walking, decreased strength, dizziness, impaired perceived functional ability, impaired sensation, impaired tone, and postural dysfunction.   ACTIVITY LIMITATIONS carrying, lifting, bending, standing, squatting, stairs, transfers, and caring for others  PARTICIPATION LIMITATIONS: meal prep, cleaning, laundry, driving, community activity, and church  PERSONAL FACTORS Age and 1-2 comorbidities:    HLD, Left breast Cancer, s/p left lobectomy, radiation, chronic kidney disease, GERD,TIA- 20 years ago (right facial numbness, dysarthria), and hiatal hernia surgery are also affecting patient's functional outcome.   REHAB POTENTIAL: Good  CLINICAL DECISION MAKING: Evolving/moderate complexity  EVALUATION COMPLEXITY: Moderate  PLAN: PT FREQUENCY: 1x/week  PT DURATION: 6 weeks+ 6 weeks (recert) + 4  weeks (recert)  PLANNED INTERVENTIONS: Therapeutic exercises, Therapeutic activity, Neuromuscular re-education, Balance training, Gait training, Patient/Family education, Self Care, Joint mobilization, Stair training, Vestibular training, Canalith repositioning, Visual/preceptual remediation/compensation, Orthotic/Fit training, DME instructions, Aquatic Therapy, Electrical stimulation, Wheelchair mobility training, Cryotherapy, Moist heat, Taping, Manual therapy, and Re-evaluation  PLAN FOR NEXT SESSION:  Assess gait speed and FGA, add to HEP if needed for stretching, balance, energy conservation techniques, discuss home setup and modifications***    Excell Seltzer, PT, DPT, [facility] [address] [place], [place]  [address] Phone:  [phone] Fax:  [phone] 11/19/22, 8:26 AM

## 2022-11-26 ENCOUNTER — Ambulatory Visit: Payer: Medicare Other | Admitting: Physical Therapy

## 2022-11-27 ENCOUNTER — Emergency Department (HOSPITAL_COMMUNITY)
Admission: EM | Admit: 2022-11-27 | Discharge: 2022-11-28 | Disposition: A | Discharge status: Home / Self Care | Payer: Medicare Other | Attending: Emergency Medicine | Admitting: Emergency Medicine

## 2022-11-27 ENCOUNTER — Encounter (HOSPITAL_COMMUNITY): Payer: Self-pay | Admitting: Emergency Medicine

## 2022-11-27 DIAGNOSIS — N3000 Acute cystitis without hematuria: Secondary | ICD-10-CM | POA: Insufficient documentation

## 2022-11-27 DIAGNOSIS — E860 Dehydration: Secondary | ICD-10-CM | POA: Diagnosis not present

## 2022-11-27 DIAGNOSIS — Z20822 Contact with and (suspected) exposure to covid-19: Secondary | ICD-10-CM | POA: Diagnosis not present

## 2022-11-27 DIAGNOSIS — Z7902 Long term (current) use of antithrombotics/antiplatelets: Secondary | ICD-10-CM | POA: Insufficient documentation

## 2022-11-27 DIAGNOSIS — N189 Chronic kidney disease, unspecified: Secondary | ICD-10-CM | POA: Insufficient documentation

## 2022-11-27 DIAGNOSIS — R531 Weakness: Secondary | ICD-10-CM | POA: Diagnosis not present

## 2022-11-27 DIAGNOSIS — R112 Nausea with vomiting, unspecified: Secondary | ICD-10-CM | POA: Diagnosis not present

## 2022-11-27 DIAGNOSIS — R1110 Vomiting, unspecified: Secondary | ICD-10-CM | POA: Diagnosis not present

## 2022-11-27 DIAGNOSIS — R110 Nausea: Secondary | ICD-10-CM | POA: Diagnosis not present

## 2022-11-27 DIAGNOSIS — R1111 Vomiting without nausea: Secondary | ICD-10-CM | POA: Diagnosis not present

## 2022-11-27 DIAGNOSIS — M419 Scoliosis, unspecified: Secondary | ICD-10-CM | POA: Diagnosis not present

## 2022-11-27 DIAGNOSIS — K6389 Other specified diseases of intestine: Secondary | ICD-10-CM | POA: Diagnosis not present

## 2022-11-27 DIAGNOSIS — R059 Cough, unspecified: Secondary | ICD-10-CM | POA: Diagnosis not present

## 2022-11-27 DIAGNOSIS — R1310 Dysphagia, unspecified: Secondary | ICD-10-CM | POA: Diagnosis not present

## 2022-11-27 LAB — COMPREHENSIVE METABOLIC PANEL
ALT: 41 U/L (ref 0–44)
AST: 39 U/L (ref 15–41)
Albumin: 3.5 g/dL (ref 3.5–5.0)
Alkaline Phosphatase: 80 U/L (ref 38–126)
Anion gap: 10 (ref 5–15)
BUN: 27 mg/dL — ABNORMAL HIGH (ref 8–23)
CO2: 22 mmol/L (ref 22–32)
Calcium: 8.9 mg/dL (ref 8.9–10.3)
Chloride: 104 mmol/L (ref 98–111)
Creatinine, Ser: 1.1 mg/dL — ABNORMAL HIGH (ref 0.44–1.00)
GFR, Estimated: 52 mL/min — ABNORMAL LOW (ref >60)
Glucose, Bld: 155 mg/dL — ABNORMAL HIGH (ref 70–99)
Potassium: 4.1 mmol/L (ref 3.5–5.1)
Sodium: 136 mmol/L (ref 135–145)
Total Bilirubin: 1 mg/dL (ref 0.3–1.2)
Total Protein: 7.1 g/dL (ref 6.5–8.1)

## 2022-11-27 LAB — CBC WITH DIFFERENTIAL/PLATELET
Abs Immature Granulocytes: 0.14 10*3/uL — ABNORMAL HIGH (ref 0.00–0.07)
Basophils Absolute: 0 10*3/uL (ref 0.0–0.1)
Basophils Relative: 0 %
Eosinophils Absolute: 0.2 10*3/uL (ref 0.0–0.5)
Eosinophils Relative: 1 %
HCT: 40.2 % (ref 36.0–46.0)
Hemoglobin: 13.6 g/dL (ref 12.0–15.0)
Immature Granulocytes: 1 %
Lymphocytes Relative: 4 %
Lymphs Abs: 0.5 10*3/uL — ABNORMAL LOW (ref 0.7–4.0)
MCH: 25.7 pg — ABNORMAL LOW (ref 26.0–34.0)
MCHC: 33.8 g/dL (ref 30.0–36.0)
MCV: 76 fL — ABNORMAL LOW (ref 80.0–100.0)
Monocytes Absolute: 0.5 10*3/uL (ref 0.1–1.0)
Monocytes Relative: 4 %
Neutro Abs: 11.4 10*3/uL — ABNORMAL HIGH (ref 1.7–7.7)
Neutrophils Relative %: 90 %
Platelets: 259 10*3/uL (ref 150–400)
RBC: 5.29 MIL/uL — ABNORMAL HIGH (ref 3.87–5.11)
RDW: 14.1 % (ref 11.5–15.5)
WBC: 12.7 10*3/uL — ABNORMAL HIGH (ref 4.0–10.5)
nRBC: 0 % (ref 0.0–0.2)

## 2022-11-27 LAB — MAGNESIUM: Magnesium: 2.3 mg/dL (ref 1.7–2.4)

## 2022-11-27 LAB — LIPASE, BLOOD: Lipase: 28 U/L (ref 11–51)

## 2022-11-27 NOTE — ED Triage Notes (Signed)
PT arrives via EMS from home with generalized weakness for 4 days, n/v/d. Diagnosed with ALS this year and recently started on new meds.

## 2022-11-27 NOTE — ED Provider Triage Note (Signed)
Emergency Medicine Provider Triage Evaluation Note  Yvonne Jordan , a 76 y.o. female  was evaluated in triage.  Pt complains of generalized weakness for 4 days.  Says she has not had any food or drink in 4 days.  She has been having some nausea and vomiting and diarrhea. Denies pain anywhere, no shortness of breath, fevers, urinary symptoms.   Review of Systems  Positive:  Negative:   Physical Exam  BP (!) 150/76 (BP Location: Right Arm)   Pulse (!) 105   Temp (!) 97.3 F (36.3 C) (Oral)   Resp 15   SpO2 97%  Gen:   Awake, no distress   Resp:  Normal effort  MSK:   Moves extremities without difficulty  Other:    Medical Decision Making  Medically screening exam initiated at 2:11 PM.  Appropriate orders placed.  Yvonne Jordan was informed that the remainder of the evaluation will be completed by another provider, this initial triage assessment does not replace that evaluation, and the importance of remaining in the ED until their evaluation is complete.     Adolphus Birchwood, [place] 11/27/22 1412

## 2022-11-28 ENCOUNTER — Emergency Department (HOSPITAL_COMMUNITY): Payer: Medicare Other

## 2022-11-28 DIAGNOSIS — K6389 Other specified diseases of intestine: Secondary | ICD-10-CM | POA: Diagnosis not present

## 2022-11-28 DIAGNOSIS — M419 Scoliosis, unspecified: Secondary | ICD-10-CM | POA: Diagnosis not present

## 2022-11-28 DIAGNOSIS — E860 Dehydration: Secondary | ICD-10-CM | POA: Diagnosis not present

## 2022-11-28 DIAGNOSIS — R1110 Vomiting, unspecified: Secondary | ICD-10-CM | POA: Diagnosis not present

## 2022-11-28 DIAGNOSIS — R059 Cough, unspecified: Secondary | ICD-10-CM | POA: Diagnosis not present

## 2022-11-28 LAB — URINALYSIS, ROUTINE W REFLEX MICROSCOPIC
Bilirubin Urine: NEGATIVE
Glucose, UA: NEGATIVE mg/dL
Ketones, ur: NEGATIVE mg/dL
Nitrite: NEGATIVE
Protein, ur: >=300 mg/dL — AB
Specific Gravity, Urine: 1.012 (ref 1.005–1.030)
WBC, UA: >50 WBC/hpf — ABNORMAL HIGH (ref 0–5)
pH: 5 (ref 5.0–8.0)

## 2022-11-28 LAB — SARS CORONAVIRUS 2 BY RT PCR: SARS Coronavirus 2 by RT PCR: NEGATIVE

## 2022-11-28 MED ORDER — SODIUM CHLORIDE 0.9 % IV SOLN
1.0000 g | Freq: Once | INTRAVENOUS | Status: AC
  Administered 2022-11-28: 1 g via INTRAVENOUS
  Filled 2022-11-28: qty 10

## 2022-11-28 MED ORDER — BOOST PLUS PO LIQD
237.0000 mL | Freq: Three times a day (TID) | ORAL | Status: DC
Start: 2022-11-28 — End: 2022-11-28
  Administered 2022-11-28: 237 mL via ORAL
  Filled 2022-11-28: qty 237

## 2022-11-28 MED ORDER — SODIUM CHLORIDE 0.9 % IV BOLUS
1000.0000 mL | Freq: Once | INTRAVENOUS | Status: AC
  Administered 2022-11-28: 1000 mL via INTRAVENOUS

## 2022-11-28 MED ORDER — CEPHALEXIN 500 MG PO CAPS: 500.0000 mg | ORAL_CAPSULE | Freq: Three times a day (TID) | ORAL | 0 refills | Status: AC

## 2022-11-28 NOTE — ED Notes (Signed)
Patient assist x1 to bathroom. Incont of urine. Brief changed and peri-care performed.

## 2022-11-28 NOTE — Discharge Instructions (Addendum)
Take the antibiotics as prescribed.  Drink plenty of fluids.  Try to supplement your diet with shakes such as boost or Ensure to help maintain your calorie intake.

## 2022-11-28 NOTE — ED Provider Notes (Signed)
[facility] Provider Note   CSN: [id number] Arrival date & time: 11/27/22  1309     History  Chief Complaint  Patient presents with  . Weakness  . Emesis    Yvonne Jordan is a 76 y.o. female.   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  Patient states she has had episodes of vomiting, 3 times per day.  Its mostly mucus.  She does not have much of an appetite.  She has had some urinary frequency but no dysuria.  She is not having any abdominal pain.  She does have an occasional cough.  Patient feels like she is getting weak and dehydrated.  She has been waiting for an extended period of time in the waiting room.  She has not had any vomiting episodes since she has been here.  She is thirsty and would like something to drink  Home Medications Prior to Admission medications   Medication Sig Start Date End Date Taking? Authorizing Provider  cephALEXin (KEFLEX) 500 MG capsule Take 1 capsule (500 mg total) by mouth 3 (three) times daily for 7 days. 11/28/22 12/05/22 Yes Dorie Rank, MD  acetaminophen (TYLENOL) 325 MG tablet Take 650 mg by mouth every 6 (six) hours as needed for moderate pain.     [provider]  atorvastatin (LIPITOR) 20 MG tablet Take 20 mg by mouth daily.  01/02/19   [provider]  clopidogrel (PLAVIX) 75 MG tablet Take 75 mg by mouth daily with breakfast.    [provider]  lipase/protease/amylase (CREON) 12000-38000 units CPEP capsule Take 36,000 Units by mouth. 2 with each meal and 1 with each snack    [provider]  Polyethyl Glycol-Propyl Glycol (SYSTANE OP) Place 1 drop into both eyes 2 (two) times daily as needed (dry eyes).     [provider]  RESTASIS 0.05 % ophthalmic  emulsion Place 1 drop into both eyes 2 (two) times daily. 04/04/22   [provider]      Allergies    Patient has no known allergies.    Review of Systems   Review of Systems  Gastrointestinal:  Positive for vomiting.  Neurological:  Positive for weakness.    Physical Exam Updated Vital Signs BP 136/80 (BP Location: Right Arm)   Pulse (!) 101   Temp 99 F (37.2 C)   Resp 20   SpO2 99%  Physical Exam Vitals and nursing note reviewed.  Constitutional:      Appearance: She is well-developed. She is not diaphoretic.  HENT:     Head: Normocephalic and atraumatic.     Right Ear: External ear normal.     Left Ear: External ear normal.     Mouth/Throat:     Mouth: Mucous membranes are dry.  Eyes:     General: No scleral icterus.       Right eye: No discharge.        Left eye: No discharge.     Conjunctiva/sclera: Conjunctivae normal.  Neck:     Trachea: No tracheal deviation.  Cardiovascular:     Rate and Rhythm: Normal rate.  Pulmonary:     Effort: Pulmonary effort is normal. No respiratory distress.     Breath sounds: No stridor.  Abdominal:     General: Abdomen is  flat. There is no distension.     Palpations: Abdomen is soft. There is no mass.     Tenderness: There is no abdominal tenderness. There is no guarding.     Hernia: No hernia is present.  Musculoskeletal:        General: No swelling or deformity.     Cervical back: Neck supple.     Right lower leg: No edema.     Left lower leg: No edema.  Skin:    General: Skin is warm and dry.     Findings: No rash.  Neurological:     Mental Status: She is alert.     Cranial Nerves: No facial asymmetry.     Motor: No seizure activity.     ED Results / Procedures / Treatments   Labs (all labs ordered are listed, but only abnormal results are displayed) Labs Reviewed  CBC WITH DIFFERENTIAL/PLATELET - Abnormal; Notable for the following components:      Result Value   WBC 12.7 (*)    RBC 5.29 (*)     MCV 76.0 (*)    MCH 25.7 (*)    Neutro Abs 11.4 (*)    Lymphs Abs 0.5 (*)    Abs Immature Granulocytes 0.14 (*)    All other components within normal limits  COMPREHENSIVE METABOLIC PANEL - Abnormal; Notable for the following components:   Glucose, Bld 155 (*)    BUN 27 (*)    Creatinine, Ser 1.10 (*)    GFR, Estimated 52 (*)    All other components within normal limits  URINALYSIS, ROUTINE W REFLEX MICROSCOPIC - Abnormal; Notable for the following components:   APPearance HAZY (*)    Hgb urine dipstick SMALL (*)    Protein, ur >=300 (*)    Leukocytes,Ua LARGE (*)    WBC, UA >50 (*)    Bacteria, UA RARE (*)    All other components within normal limits  SARS CORONAVIRUS 2 BY RT PCR  URINE CULTURE  LIPASE, BLOOD  MAGNESIUM    EKG EKG Interpretation  Date/Time:  Tuesday November 27 2022 13:58:55 EST Ventricular Rate:  100 PR Interval:  120 QRS Duration: 78 QT Interval:  344 QTC Calculation: 443 R Axis:   -32 Text Interpretation: Normal sinus rhythm Left axis deviation Septal infarct , age undetermined Abnormal ECG When compared with ECG of 11-Mar-2022 16:28, No significant change was found Confirmed by Delora Fuel ([id number]) on 11/27/2022 11:49:17 PM  Radiology DG Abdomen Acute W/Chest  Result Date: 11/28/2022 CLINICAL DATA:  Vomiting and cough. EXAM: DG ABDOMEN ACUTE WITH 1 VIEW CHEST COMPARISON:  Chest and abdomen CT 06/28/2022 FINDINGS: The lungs are clear without focal pneumonia, edema, pneumothorax or pleural effusion. Staple line noted peripheral right mid lung. The cardiopericardial silhouette is within normal limits for size. Right-side-up decubitus film shows no evidence for intraperitoneal free air. Supine abdomen shows no gaseous small bowel dilatation to suggest obstruction. Mild diffuse gaseous distention of the colon noted from the cecum to the rectum. Mild convex leftward lumbar scoliosis evident. Surgical clips in the right upper quadrant suggest prior  cholecystectomy. IMPRESSION: 1. No acute cardiopulmonary findings. 2. No evidence for intraperitoneal free air. 3. Mild diffuse gaseous distention of the colon, nonspecific. Electronically Signed   By: Misty Stanley M.D.   On: 11/28/2022 07:50    Procedures Procedures    Medications Ordered in ED Medications  cefTRIAXone (ROCEPHIN) 1 g in sodium chloride 0.9 % 100 mL IVPB (has no administration in  time range)  lactose free nutrition (BOOST PLUS) liquid 237 mL (has no administration in time range)  sodium chloride 0.9 % bolus 1,000 mL (1,000 mLs Intravenous New Bag/Given 11/28/22 0840)    ED Course/ Medical Decision Making/ A&P Clinical Course as of 11/28/22 1051  Wed Nov 28, 2022  0801 AAS without free air, mild gaseous distension of large intestine [JK]  [phone] Urinalysis is consistent with urinary tract infection [JK]  1050 SARS Coronavirus 2 by RT PCR (hospital order, performed in [facility] hospital lab) *cepheid single result test* Anterior Nasal Swab Covid negative [JK]    Clinical Course User Index [JK] Dorie Rank, MD                           Medical Decision Making Problems Addressed: Acute cystitis without hematuria: acute illness or injury that poses a threat to life or bodily functions Dehydration: acute illness or injury that poses a threat to life or bodily functions Dysphagia, unspecified type: chronic illness or injury  Amount and/or Complexity of Data Reviewed Labs: ordered. Radiology: ordered.  Risk Prescription drug management.   Patient presents ED for evaluation of weakness, decreased p.o. intake, urinary frequency.  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  She has dysphagia associated with that condition.  I suspect this is contributing to her decreased p.o. intake.  Patient's not had any vomiting or diarrhea here in the ED.  Labs notable for increased BUN and creatinine.  Patient has been treated with IV fluids.  She has been able to drink without difficulty in  the ED.  No signs of sepsis or systemic infection.  No evidence of bowel obstruction or pneumonia.  Patient's urinalysis is consistent with a urinary tract infection and she has noticed urinary frequency.  Will plan on course of antibiotics.   also encouraged the use of boost or Ensure to supplement her diet increase her calorie contact.        Final Clinical Impression(s) / ED Diagnoses Final diagnoses:  Acute cystitis without hematuria  Dehydration  Dysphagia, unspecified type    Rx / DC Orders ED Discharge Orders          Ordered    cephALEXin (KEFLEX) 500 MG capsule  3 times daily        11/28/22 0950              Dorie Rank, MD 11/28/22 [phone]

## 2022-11-29 ENCOUNTER — Other Ambulatory Visit: Payer: Medicare Other

## 2022-11-29 ENCOUNTER — Ambulatory Visit: Payer: Medicare Other | Admitting: Adult Health

## 2022-11-30 ENCOUNTER — Inpatient Hospital Stay: Admission: RE | Admit: 2022-11-30 | Payer: Medicare Other | Source: Ambulatory Visit

## 2022-11-30 LAB — URINE CULTURE: Culture: >=100000 — AB

## 2022-12-01 ENCOUNTER — Telehealth (HOSPITAL_BASED_OUTPATIENT_CLINIC_OR_DEPARTMENT_OTHER): Payer: Self-pay | Admitting: *Deleted

## 2022-12-01 NOTE — Telephone Encounter (Signed)
Post ED Visit - Positive Culture Follow-up  Culture report reviewed by antimicrobial stewardship pharmacist: [place] Team '[]'$  Elenor Quinones, Pharm.D. '[]'$  Heide Guile, Pharm.D., BCPS AQ-ID '[]'$  Parks Neptune, Pharm.D., BCPS '[]'$  Alycia Rossetti, Pharm.D., BCPS '[]'$  [place], Pharm.D., BCPS, AAHIVP '[]'$  Legrand Como, Pharm.D., BCPS, AAHIVP '[]'$  Salome Arnt, PharmD, BCPS '[]'$  Johnnette Gourd, PharmD, BCPS '[]'$  Hughes Better, PharmD, BCPS '[]'$  Leeroy Cha, PharmD '[]'$  Laqueta Linden, PharmD, BCPS '[x]'$  Idamae Schuller, PharmD  [place] Team '[]'$  Leodis Sias, PharmD '[]'$  Lindell Spar, PharmD '[]'$  Royetta Asal, PharmD '[]'$  Graylin Shiver, Rph '[]'$  Rema Fendt) Glennon Mac, PharmD '[]'$  Arlyn Dunning, PharmD '[]'$  Netta Cedars, PharmD '[]'$  Dia Sitter, PharmD '[]'$  Leone Haven, PharmD '[]'$  Gretta Arab, PharmD '[]'$  Theodis Shove, PharmD '[]'$  Peggyann Juba, PharmD '[]'$  Reuel Boom, PharmD   Positive urine culture Treated with Cephalexin, organism sensitive to the same and no further patient follow-up is required at this time.  Yvonne Jordan 12/01/2022, 11:40 AM

## 2022-12-03 ENCOUNTER — Telehealth: Payer: Self-pay | Admitting: Physical Therapy

## 2022-12-03 ENCOUNTER — Ambulatory Visit: Payer: Medicare Other | Attending: Family Medicine | Admitting: Physical Therapy

## 2022-12-07 DIAGNOSIS — E559 Vitamin D deficiency, unspecified: Secondary | ICD-10-CM | POA: Diagnosis not present

## 2022-12-07 DIAGNOSIS — Z993 Dependence on wheelchair: Secondary | ICD-10-CM | POA: Diagnosis not present

## 2022-12-07 DIAGNOSIS — G1220 Motor neuron disease, unspecified: Secondary | ICD-10-CM | POA: Diagnosis not present

## 2022-12-07 DIAGNOSIS — K8681 Exocrine pancreatic insufficiency: Secondary | ICD-10-CM | POA: Diagnosis not present

## 2022-12-07 DIAGNOSIS — J439 Emphysema, unspecified: Secondary | ICD-10-CM | POA: Diagnosis not present

## 2022-12-07 DIAGNOSIS — I129 Hypertensive chronic kidney disease with stage 1 through stage 4 chronic kidney disease, or unspecified chronic kidney disease: Secondary | ICD-10-CM | POA: Diagnosis not present

## 2022-12-07 DIAGNOSIS — J309 Allergic rhinitis, unspecified: Secondary | ICD-10-CM | POA: Diagnosis not present

## 2022-12-07 DIAGNOSIS — M50322 Other cervical disc degeneration at C5-C6 level: Secondary | ICD-10-CM | POA: Diagnosis not present

## 2022-12-07 DIAGNOSIS — Z7902 Long term (current) use of antithrombotics/antiplatelets: Secondary | ICD-10-CM | POA: Diagnosis not present

## 2022-12-07 DIAGNOSIS — I700 Atherosclerosis of aorta: Secondary | ICD-10-CM | POA: Diagnosis not present

## 2022-12-07 DIAGNOSIS — Z8673 Personal history of transient ischemic attack (TIA), and cerebral infarction without residual deficits: Secondary | ICD-10-CM | POA: Diagnosis not present

## 2022-12-07 DIAGNOSIS — G43909 Migraine, unspecified, not intractable, without status migrainosus: Secondary | ICD-10-CM | POA: Diagnosis not present

## 2022-12-07 DIAGNOSIS — M21372 Foot drop, left foot: Secondary | ICD-10-CM | POA: Diagnosis not present

## 2022-12-07 DIAGNOSIS — M85851 Other specified disorders of bone density and structure, right thigh: Secondary | ICD-10-CM | POA: Diagnosis not present

## 2022-12-07 DIAGNOSIS — N1831 Chronic kidney disease, stage 3a: Secondary | ICD-10-CM | POA: Diagnosis not present

## 2022-12-07 DIAGNOSIS — J849 Interstitial pulmonary disease, unspecified: Secondary | ICD-10-CM | POA: Diagnosis not present

## 2022-12-07 DIAGNOSIS — Z853 Personal history of malignant neoplasm of breast: Secondary | ICD-10-CM | POA: Diagnosis not present

## 2022-12-07 DIAGNOSIS — Z9181 History of falling: Secondary | ICD-10-CM | POA: Diagnosis not present

## 2022-12-07 DIAGNOSIS — K219 Gastro-esophageal reflux disease without esophagitis: Secondary | ICD-10-CM | POA: Diagnosis not present

## 2022-12-13 DIAGNOSIS — M50322 Other cervical disc degeneration at C5-C6 level: Secondary | ICD-10-CM | POA: Diagnosis not present

## 2022-12-13 DIAGNOSIS — N1831 Chronic kidney disease, stage 3a: Secondary | ICD-10-CM | POA: Diagnosis not present

## 2022-12-13 DIAGNOSIS — G43909 Migraine, unspecified, not intractable, without status migrainosus: Secondary | ICD-10-CM | POA: Diagnosis not present

## 2022-12-13 DIAGNOSIS — I129 Hypertensive chronic kidney disease with stage 1 through stage 4 chronic kidney disease, or unspecified chronic kidney disease: Secondary | ICD-10-CM | POA: Diagnosis not present

## 2022-12-13 DIAGNOSIS — E559 Vitamin D deficiency, unspecified: Secondary | ICD-10-CM | POA: Diagnosis not present

## 2022-12-13 DIAGNOSIS — G1220 Motor neuron disease, unspecified: Secondary | ICD-10-CM | POA: Diagnosis not present

## 2022-12-18 ENCOUNTER — Telehealth (HOSPITAL_COMMUNITY): Payer: Self-pay

## 2022-12-18 NOTE — Telephone Encounter (Signed)
-----   Message from Michaelle Birks, MD sent at 12/13/2022  3:56 PM EST ----- Regarding: RE: peg placement PROCEDURE REVIEW Date: 12/13/22  Requested Procedure: Gastrostomy Reason for request: Dysphagia. < Wt. ALS   Imaging review: I reviewed all pertinent diagnostic studies, including; multiple CT AP, most recently 06/28/22 Recommended imaging modality to perform biopsy: Fluoroscopy  Additional comments: ALS patient, will require CRNA / Anesthesia support for MAC _0 . 5d Plavix hold  Please contact me with questions, concerns, or if issue pertaining to this request arise.  Michaelle Birks, MD [facility] Rad[facility]  Pager. [phone] Clinic. [phone]  ----- Message ----- From: Danielle Dess Sent: 12/13/2022   3:42 PM EST To: Joanell Rising; Ir Procedure Requests Subject: peg placement                                  Procedure: peg placement  Ordering: Dr. Antony Contras [facility] at [facility] [phone]  Imaging: ct chest done in July 2023 in epic   Dx: motor neuron disease  Please review.   Thanks,  Lia Foyer

## 2022-12-18 NOTE — Telephone Encounter (Signed)
Called [facility] at [facility] to find out if nutrition and peg management has been arranged prior to peg placement, no answer. Will try back later. AW

## 2022-12-19 ENCOUNTER — Other Ambulatory Visit (HOSPITAL_COMMUNITY): Payer: Self-pay | Admitting: Family Medicine

## 2022-12-19 DIAGNOSIS — G1220 Motor neuron disease, unspecified: Secondary | ICD-10-CM

## 2022-12-20 DIAGNOSIS — G43909 Migraine, unspecified, not intractable, without status migrainosus: Secondary | ICD-10-CM | POA: Diagnosis not present

## 2022-12-20 DIAGNOSIS — M50322 Other cervical disc degeneration at C5-C6 level: Secondary | ICD-10-CM | POA: Diagnosis not present

## 2022-12-20 DIAGNOSIS — E559 Vitamin D deficiency, unspecified: Secondary | ICD-10-CM | POA: Diagnosis not present

## 2022-12-20 DIAGNOSIS — N1831 Chronic kidney disease, stage 3a: Secondary | ICD-10-CM | POA: Diagnosis not present

## 2022-12-20 DIAGNOSIS — I129 Hypertensive chronic kidney disease with stage 1 through stage 4 chronic kidney disease, or unspecified chronic kidney disease: Secondary | ICD-10-CM | POA: Diagnosis not present

## 2022-12-20 DIAGNOSIS — G1220 Motor neuron disease, unspecified: Secondary | ICD-10-CM | POA: Diagnosis not present

## 2022-12-26 ENCOUNTER — Encounter (HOSPITAL_COMMUNITY): Payer: Self-pay

## 2022-12-26 ENCOUNTER — Ambulatory Visit (HOSPITAL_COMMUNITY)
Admission: RE | Admit: 2022-12-26 | Payer: Medicare Other | Source: Home / Self Care | Admitting: Interventional Radiology

## 2022-12-26 ENCOUNTER — Encounter (HOSPITAL_COMMUNITY): Admission: RE | Payer: Self-pay | Source: Home / Self Care

## 2022-12-26 ENCOUNTER — Ambulatory Visit (HOSPITAL_COMMUNITY): Admission: RE | Admit: 2022-12-26 | Payer: Medicare Other | Source: Ambulatory Visit

## 2022-12-26 SURGERY — IR WITH ANESTHESIA
Anesthesia: General

## 2022-12-27 DIAGNOSIS — N1831 Chronic kidney disease, stage 3a: Secondary | ICD-10-CM | POA: Diagnosis not present

## 2022-12-27 DIAGNOSIS — G43909 Migraine, unspecified, not intractable, without status migrainosus: Secondary | ICD-10-CM | POA: Diagnosis not present

## 2022-12-27 DIAGNOSIS — I129 Hypertensive chronic kidney disease with stage 1 through stage 4 chronic kidney disease, or unspecified chronic kidney disease: Secondary | ICD-10-CM | POA: Diagnosis not present

## 2022-12-27 DIAGNOSIS — M50322 Other cervical disc degeneration at C5-C6 level: Secondary | ICD-10-CM | POA: Diagnosis not present

## 2022-12-27 DIAGNOSIS — G1220 Motor neuron disease, unspecified: Secondary | ICD-10-CM | POA: Diagnosis not present

## 2022-12-27 DIAGNOSIS — E559 Vitamin D deficiency, unspecified: Secondary | ICD-10-CM | POA: Diagnosis not present

## 2022-12-31 DIAGNOSIS — M50322 Other cervical disc degeneration at C5-C6 level: Secondary | ICD-10-CM | POA: Diagnosis not present

## 2022-12-31 DIAGNOSIS — G43909 Migraine, unspecified, not intractable, without status migrainosus: Secondary | ICD-10-CM | POA: Diagnosis not present

## 2022-12-31 DIAGNOSIS — N1831 Chronic kidney disease, stage 3a: Secondary | ICD-10-CM | POA: Diagnosis not present

## 2022-12-31 DIAGNOSIS — G1220 Motor neuron disease, unspecified: Secondary | ICD-10-CM | POA: Diagnosis not present

## 2022-12-31 DIAGNOSIS — E559 Vitamin D deficiency, unspecified: Secondary | ICD-10-CM | POA: Diagnosis not present

## 2022-12-31 DIAGNOSIS — I129 Hypertensive chronic kidney disease with stage 1 through stage 4 chronic kidney disease, or unspecified chronic kidney disease: Secondary | ICD-10-CM | POA: Diagnosis not present

## 2023-01-02 ENCOUNTER — Telehealth (HOSPITAL_COMMUNITY): Payer: Self-pay

## 2023-01-02 NOTE — Telephone Encounter (Signed)
Called to reschedule peg placement, no answer, left vm. AW  

## 2023-01-03 DIAGNOSIS — G1220 Motor neuron disease, unspecified: Secondary | ICD-10-CM | POA: Diagnosis not present

## 2023-01-03 DIAGNOSIS — E559 Vitamin D deficiency, unspecified: Secondary | ICD-10-CM | POA: Diagnosis not present

## 2023-01-03 DIAGNOSIS — N1831 Chronic kidney disease, stage 3a: Secondary | ICD-10-CM | POA: Diagnosis not present

## 2023-01-03 DIAGNOSIS — I129 Hypertensive chronic kidney disease with stage 1 through stage 4 chronic kidney disease, or unspecified chronic kidney disease: Secondary | ICD-10-CM | POA: Diagnosis not present

## 2023-01-03 DIAGNOSIS — G43909 Migraine, unspecified, not intractable, without status migrainosus: Secondary | ICD-10-CM | POA: Diagnosis not present

## 2023-01-03 DIAGNOSIS — M50322 Other cervical disc degeneration at C5-C6 level: Secondary | ICD-10-CM | POA: Diagnosis not present

## 2023-01-06 DIAGNOSIS — G43909 Migraine, unspecified, not intractable, without status migrainosus: Secondary | ICD-10-CM | POA: Diagnosis not present

## 2023-01-06 DIAGNOSIS — M50322 Other cervical disc degeneration at C5-C6 level: Secondary | ICD-10-CM | POA: Diagnosis not present

## 2023-01-06 DIAGNOSIS — E559 Vitamin D deficiency, unspecified: Secondary | ICD-10-CM | POA: Diagnosis not present

## 2023-01-06 DIAGNOSIS — N1831 Chronic kidney disease, stage 3a: Secondary | ICD-10-CM | POA: Diagnosis not present

## 2023-01-06 DIAGNOSIS — K219 Gastro-esophageal reflux disease without esophagitis: Secondary | ICD-10-CM | POA: Diagnosis not present

## 2023-01-06 DIAGNOSIS — M21372 Foot drop, left foot: Secondary | ICD-10-CM | POA: Diagnosis not present

## 2023-01-06 DIAGNOSIS — Z853 Personal history of malignant neoplasm of breast: Secondary | ICD-10-CM | POA: Diagnosis not present

## 2023-01-06 DIAGNOSIS — J309 Allergic rhinitis, unspecified: Secondary | ICD-10-CM | POA: Diagnosis not present

## 2023-01-06 DIAGNOSIS — J439 Emphysema, unspecified: Secondary | ICD-10-CM | POA: Diagnosis not present

## 2023-01-06 DIAGNOSIS — I129 Hypertensive chronic kidney disease with stage 1 through stage 4 chronic kidney disease, or unspecified chronic kidney disease: Secondary | ICD-10-CM | POA: Diagnosis not present

## 2023-01-06 DIAGNOSIS — M85851 Other specified disorders of bone density and structure, right thigh: Secondary | ICD-10-CM | POA: Diagnosis not present

## 2023-01-06 DIAGNOSIS — Z8673 Personal history of transient ischemic attack (TIA), and cerebral infarction without residual deficits: Secondary | ICD-10-CM | POA: Diagnosis not present

## 2023-01-06 DIAGNOSIS — Z993 Dependence on wheelchair: Secondary | ICD-10-CM | POA: Diagnosis not present

## 2023-01-06 DIAGNOSIS — J849 Interstitial pulmonary disease, unspecified: Secondary | ICD-10-CM | POA: Diagnosis not present

## 2023-01-06 DIAGNOSIS — K8681 Exocrine pancreatic insufficiency: Secondary | ICD-10-CM | POA: Diagnosis not present

## 2023-01-06 DIAGNOSIS — G1220 Motor neuron disease, unspecified: Secondary | ICD-10-CM | POA: Diagnosis not present

## 2023-01-06 DIAGNOSIS — Z9181 History of falling: Secondary | ICD-10-CM | POA: Diagnosis not present

## 2023-01-06 DIAGNOSIS — Z7902 Long term (current) use of antithrombotics/antiplatelets: Secondary | ICD-10-CM | POA: Diagnosis not present

## 2023-01-06 DIAGNOSIS — I700 Atherosclerosis of aorta: Secondary | ICD-10-CM | POA: Diagnosis not present

## 2023-01-07 DIAGNOSIS — N1831 Chronic kidney disease, stage 3a: Secondary | ICD-10-CM | POA: Diagnosis not present

## 2023-01-07 DIAGNOSIS — E559 Vitamin D deficiency, unspecified: Secondary | ICD-10-CM | POA: Diagnosis not present

## 2023-01-07 DIAGNOSIS — M50322 Other cervical disc degeneration at C5-C6 level: Secondary | ICD-10-CM | POA: Diagnosis not present

## 2023-01-07 DIAGNOSIS — G1220 Motor neuron disease, unspecified: Secondary | ICD-10-CM | POA: Diagnosis not present

## 2023-01-07 DIAGNOSIS — I129 Hypertensive chronic kidney disease with stage 1 through stage 4 chronic kidney disease, or unspecified chronic kidney disease: Secondary | ICD-10-CM | POA: Diagnosis not present

## 2023-01-07 DIAGNOSIS — G43909 Migraine, unspecified, not intractable, without status migrainosus: Secondary | ICD-10-CM | POA: Diagnosis not present

## 2023-01-09 ENCOUNTER — Other Ambulatory Visit: Payer: Self-pay

## 2023-01-09 ENCOUNTER — Other Ambulatory Visit: Payer: Self-pay | Admitting: Radiology

## 2023-01-09 ENCOUNTER — Encounter (HOSPITAL_COMMUNITY): Payer: Self-pay | Admitting: *Deleted

## 2023-01-09 DIAGNOSIS — G1220 Motor neuron disease, unspecified: Secondary | ICD-10-CM

## 2023-01-09 NOTE — Progress Notes (Signed)
PCP - Olen Pel, MD Cardiologist - Buford Dresser, MD  PPM/ICD - denies  Chest x-ray - n/a EKG - 11/28/22 Stress Test - 12/14/13  CPAP - n/a  Fasting Blood Sugar - n/a  Blood Thinner Instructions: Plavix - last day - 01/05/23 per patient Aspirin Instructions: Patient was instructed: As of today, STOP taking any Aspirin (unless otherwise instructed by your surgeon) Aleve, Naproxen, Ibuprofen, Motrin, Advil, Goody's, BC's, all herbal medications, fish oil, and all vitamins.   ERAS Protcol - n/a  COVID TEST- n/a  Anesthesia review: yes - abnormal EKG; history of Stroke, ALS  Patient verbally denies any shortness of breath, fever, cough and chest pain during phone call   -------------  SDW INSTRUCTIONS given:  Your procedure is scheduled on Friday, January 29th, 2024.  Report to [facility] Main Entrance "A" at 06:30 A.M., and check in at the Admitting office.  Call this number if you have problems the morning of surgery:  [phone]   Remember:  Do not eat or drink after midnight the night before your surgery    Take these medicines the morning of surgery with A SIP OF WATER: Lipitor, Creon, Rilutek PRN; Tylenol    The day of surgery:                    Do not wear jewelry, make up, or nail polish            Do not wear lotions, powders, perfumes, or deodorant.            Do not shave 48 hours prior to surgery.              Do not bring valuables to the hospital.            [facility] is not responsible for any belongings or valuables.  Do NOT Smoke (Tobacco/Vaping) 24 hours prior to your procedure If you use a CPAP at night, you may bring all equipment for your overnight stay.   Contacts, glasses, dentures or bridgework may not be worn into surgery.      For patients admitted to the hospital, discharge time will be determined by your treatment team.   Patients discharged the day of surgery will not be allowed to drive home, and someone needs  to stay with them for 24 hours.    Special instructions:   [place]- Preparing For Surgery  Before surgery, you can play an important role. Because skin is not sterile, your skin needs to be as free of germs as possible. You can reduce the number of germs on your skin by washing with CHG (chlorahexidine gluconate) Soap before surgery.  CHG is an antiseptic cleaner which kills germs and bonds with the skin to continue killing germs even after washing.    Oral Hygiene is also important to reduce your risk of infection.  Remember - BRUSH YOUR TEETH THE MORNING OF SURGERY WITH YOUR REGULAR TOOTHPASTE  Please do not use if you have an allergy to CHG or antibacterial soaps. If your skin becomes reddened/irritated stop using the CHG.  Do not shave (including legs and underarms) for at least 48 hours prior to first CHG shower. It is OK to shave your face.  Please follow these instructions carefully.   Shower the NIGHT BEFORE SURGERY and the MORNING OF SURGERY with DIAL Soap.   Pat yourself dry with a CLEAN TOWEL.  Wear CLEAN PAJAMAS to bed the night before surgery  Place  CLEAN SHEETS on your bed the night of your first shower and DO NOT SLEEP WITH PETS.   Day of Surgery: Please shower morning of surgery  Wear Clean/Comfortable clothing the morning of surgery Do not apply any deodorants/lotions.   Remember to brush your teeth WITH YOUR REGULAR TOOTHPASTE.   Questions were answered. Patient verbalized understanding of instructions.

## 2023-01-10 ENCOUNTER — Other Ambulatory Visit: Payer: Self-pay | Admitting: Student

## 2023-01-10 DIAGNOSIS — M50322 Other cervical disc degeneration at C5-C6 level: Secondary | ICD-10-CM | POA: Diagnosis not present

## 2023-01-10 DIAGNOSIS — G1220 Motor neuron disease, unspecified: Secondary | ICD-10-CM | POA: Diagnosis not present

## 2023-01-10 DIAGNOSIS — E559 Vitamin D deficiency, unspecified: Secondary | ICD-10-CM | POA: Diagnosis not present

## 2023-01-10 DIAGNOSIS — G43909 Migraine, unspecified, not intractable, without status migrainosus: Secondary | ICD-10-CM | POA: Diagnosis not present

## 2023-01-10 DIAGNOSIS — N1831 Chronic kidney disease, stage 3a: Secondary | ICD-10-CM | POA: Diagnosis not present

## 2023-01-10 DIAGNOSIS — I129 Hypertensive chronic kidney disease with stage 1 through stage 4 chronic kidney disease, or unspecified chronic kidney disease: Secondary | ICD-10-CM | POA: Diagnosis not present

## 2023-01-10 NOTE — H&P (Signed)
Chief Complaint: Dysphagia. Request is for gastrostomy tube placement for nutritional access.   Referring Physician(s): Swayne,David  Supervising Physician: Markus Daft  Patient Status: [facility] - Out-pt  History of Present Illness: Yvonne Jordan is a 77 y.o. female outpatient. History of TIA, CVA, HLD, GERD, CKD, ALS. Now with dysphagia and penetration seen on Modified Barium Swallow with SLT. Team is requesting a gastrostomy tube for nutritional access. Case reviewed and approved by IR Attending Dr. Lenna Sciara. [place]. Recommend anesthesia.   Currently without any significant complaints. Patient alert and laying in bed,calm. Denies any fevers, headache, chest pain, SOB, cough, abdominal pain, nausea, vomiting or bleeding.  Return precautions and treatment recommendations and follow-up discussed with the patient and niece at bedside both who are agreeable with the plan.   Past Medical History:  Diagnosis Date   Allergic rhinitis    ALS (amyotrophic lateral sclerosis) (HCC)    Arthritis    Cancer ([place])    Breast- left   CKD (chronic kidney disease)    stage III   Colon polyp    Dyspnea    GERD (gastroesophageal reflux disease)    History of hiatal hernia    Hyperlipidemia    Personal history of radiation therapy    Stroke [facility])    TIA before 2013   TIA (transient ischemic attack)    Vitamin D deficiency     Past Surgical History:  Procedure Laterality Date   APPENDECTOMY     BREAST LUMPECTOMY Left 2006   BREAST SURGERY     2006...treated with Tamoxifen for 6 years   CHOLECYSTECTOMY     COLONOSCOPY     ESOPHAGOGASTRODUODENOSCOPY N/A 09/01/2020   Procedure: ESOPHAGOGASTRODUODENOSCOPY (EGD);  Surgeon: Lajuana Matte, MD;  Location: [facility];  Service: Thoracic;  Laterality: N/A;   XI ROBOTIC ASSISTED PARAESOPHAGEAL HERNIA REPAIR N/A 09/01/2020   Procedure: XI ROBOTIC ASSISTED HIATAL HERNIA REPAIR WITH FUNDOPLICATION AND GASTROPEXY;  Surgeon: Lajuana Matte, MD;   Location: [facility];  Service: Thoracic;  Laterality: N/A;    Allergies: Relyvrio [phenylbutyrate-taurursodiol]  Medications: Prior to Admission medications   Medication Sig Start Date End Date Taking? Authorizing Provider  acetaminophen (TYLENOL) 500 MG tablet Take 1,000 mg by mouth every 6 (six) hours as needed for moderate pain.    [provider]  atorvastatin (LIPITOR) 20 MG tablet Take 20 mg by mouth in the morning. 01/02/19   [provider]  B Complex-C (SUPER B COMPLEX PO) Take 1 tablet by mouth daily.    [provider]  clopidogrel (PLAVIX) 75 MG tablet Take 75 mg by mouth daily with breakfast.    [provider]  Cyanocobalamin (B-12) 5000 MCG CAPS Take 5,000 mcg by mouth daily.    [provider]  lipase/protease/amylase (CREON) 36000 UNITS CPEP capsule Take 36,000 Units by mouth See admin instructions. Take 36000 units before  each meal and before each snack    [provider]  riluzole (RILUTEK) 50 MG tablet Take 50 mg by mouth every 12 (twelve) hours.    [provider]     Family History  Problem Relation Age of Onset   Breast cancer Sister    Heart attack Sister    Heart disease Father    Stroke Father    Heart disease Maternal Uncle    Heart disease Maternal Uncle    Heart disease Maternal Uncle    Stroke Mother    Deep vein thrombosis Mother     Social History  Socioeconomic History   Marital status: Married    Spouse name: Not on file   Number of children: Not on file   Years of education: Not on file   Highest education level: Not on file  Occupational History   Occupation: Sales  Tobacco Use   Smoking status: Never   Smokeless tobacco: Never  Vaping Use   Vaping Use: Never used  Substance and Sexual Activity   Alcohol use: Not Currently    Comment: 1 drink -every few weeks   Drug use: No   Sexual activity: Not on file  Other Topics Concern   Not on file  Social History Narrative    Drinks about 2 cups of coffee a day    Social Determinants of Health   Financial Resource Strain: Not on file  Food Insecurity: Not on file  Transportation Needs: Not on file  Physical Activity: Not on file  Stress: Not on file  Social Connections: Not on file    Review of Systems: A 12 point ROS discussed and pertinent positives are indicated in the HPI above.  All other systems are negative.  Review of Systems  Constitutional:  Negative for fatigue and fever.  HENT:  Negative for congestion.   Respiratory:  Negative for cough and shortness of breath.   Gastrointestinal:  Negative for abdominal pain, diarrhea, nausea and vomiting.    Vital Signs: B/P  - 122/60 HR 89, O2 99 respiration 18. Temp 98.9   Physical Exam Vitals and nursing note reviewed.  Constitutional:      Appearance: She is well-developed.  HENT:     Head: Normocephalic and atraumatic.  Eyes:     Conjunctiva/sclera: Conjunctivae normal.  Cardiovascular:     Rate and Rhythm: Normal rate and regular rhythm.  Pulmonary:     Effort: Pulmonary effort is normal.     Breath sounds: Normal breath sounds.  Musculoskeletal:     Cervical back: Normal range of motion.  Neurological:     Mental Status: She is alert and oriented to person, place, and time.     Imaging: No results found.  Labs:  CBC: Recent Labs    11/27/22 1422 01/11/23 0716  WBC 12.7* 6.3  HGB 13.6 10.6*  HCT 40.2 31.5*  PLT 259 322    COAGS: Recent Labs    01/11/23 0716  INR 1.0    BMP: Recent Labs    11/27/22 1422 01/11/23 0716  NA 136 139  K 4.1 3.5  CL 104 107  CO2 22 24  GLUCOSE 155* 98  BUN 27* 8  CALCIUM 8.9 8.9  CREATININE 1.10* 0.84  GFRNONAA 52* >60    LIVER FUNCTION TESTS: Recent Labs    05/08/22 1104 11/27/22 1422  BILITOT  --  1.0  AST  --  39  ALT  --  41  ALKPHOS  --  80  PROT 6.4 7.1  ALBUMIN  --  3.5     Assessment and Plan:  77 y.o. female outpatient. History of TIA, CVA, HLD, GERD,  CKD, ALS. Now with dysphagia and penetration seen on Modified Barium Swallow with SLT. Team is requesting a gastrostomy tube for nutritional access. Case reviewed and approved by IR Attending Dr. Lenna Sciara. [place]. Recommend anesthesia.   All labs are within acceptable parameters. Patient is on plavix. Last dose on 1.13.24. No pertinent allergies. Patient has been NPO since midnight.   Risks and benefits image guided gastrostomy tube placement was discussed with the patient including, but  not limited to the need for a barium enema during the procedure, bleeding, infection, peritonitis and/or damage to adjacent structures.  All of the patient's  and her niece's questions were answered, patient  and her niece are agreeable to proceed.  Consent signed and in chart.   Thank you for this interesting consult.  I greatly enjoyed meeting Kerstie Agent and look forward to participating in their care.  A copy of this report was sent to the requesting provider on this date.  Electronically Signed: Jacqualine Mau, NP 01/11/2023, 8:07 AM   I spent a total of  30 Minutes   in face to face in clinical consultation, greater than 50% of which was counseling/coordinating care for G tube placement

## 2023-01-10 NOTE — Anesthesia Preprocedure Evaluation (Addendum)
Anesthesia Evaluation  Patient identified by MRN, date of birth, ID band Patient awake    Reviewed: Allergy & Precautions, NPO status , Patient's Chart, lab work & pertinent test results  History of Anesthesia Complications Negative for: history of anesthetic complications  Airway Mallampati: III  TM Distance: >3 FB Neck ROM: Full    Dental no notable dental hx. (+) Dental Advisory Given   Pulmonary neg pulmonary ROS   Pulmonary exam normal        Cardiovascular negative cardio ROS Normal cardiovascular exam     Neuro/Psych ALS CVA    GI/Hepatic Neg liver ROS, hiatal hernia,GERD  ,,dysphagia   Endo/Other  negative endocrine ROS    Renal/GU negative Renal ROS     Musculoskeletal negative musculoskeletal ROS (+)    Abdominal   Peds  Hematology negative hematology ROS (+)   Anesthesia Other Findings   Reproductive/Obstetrics                             Anesthesia Physical Anesthesia Plan  ASA: 3  Anesthesia Plan: MAC   Post-op Pain Management: Minimal or no pain anticipated   Induction:   PONV Risk Score and Plan: 2 and Ondansetron and Dexamethasone  Airway Management Planned: Natural Airway  Additional Equipment:   Intra-op Plan:   Post-operative Plan:   Informed Consent: I have reviewed the patients History and Physical, chart, labs and discussed the procedure including the risks, benefits and alternatives for the proposed anesthesia with the patient or authorized representative who has indicated his/her understanding and acceptance.     Dental advisory given  Plan Discussed with: Anesthesiologist and CRNA  Anesthesia Plan Comments: (PAT note written 01/10/2023 by Myra Gianotti, PA-C. History of ALS.)       Anesthesia Quick Evaluation

## 2023-01-10 NOTE — Progress Notes (Signed)
Anesthesia Chart Review: Kathleene Hazel  Case: [id number] Date/Time: 01/11/23 0845   Procedure: IR WITH ANESTHESIA INTERVENTIONAL PEG PLACEMENT   Anesthesia type: Monitor Anesthesia Care   Pre-op diagnosis: MOTOR NEURON DISEASE   Location: [place] [facility] [place] [facility]   Surgeons: Radiologist, Medication, MD       DISCUSSION: Patient is a 77 year old female scheduled for the above procedure. She has ALS. Gastrotomy tube requested for dysphagia.  History includes never smoker, HLD, TIA (~ 2005), GERD, dyspnea, CKD stage III, left breast cancer (s/p lumpectomy, radiation), motor neuron disease/ALS, hiatal hernia (s/p repair, Nissen fundoplication, Gastropexy 09/01/20), Right VATS for RU, RM, RL wedges resection for ILD evaluation (mild pneumonitis likely form GERD), pancreatic insufficiency. Coronary CTA 05/07/19 without CAD, calcium score 0.  Last cardiology follow-up with Dr. Harrell Gave was on 04/13/2022.  Previous evaluation for chest pain in 2020 with normal CCTA. Chest pain resolved.  Reported palpitations while in bed, continue to monitor.  Follow-up in 1 year or sooner if needed.  Reported last Plavix 01/05/23..  She is on this for history of TIA.  She is a same day work-up.  Anesthesia team to evaluate on the day of surgery.  VS:  BP Readings from Last 3 Encounters:  11/28/22 133/73  05/30/22 128/77  05/08/22 114/65   Pulse Readings from Last 3 Encounters:  11/28/22 90  05/30/22 74  05/08/22 77     PROVIDERS: Antony Contras, MD is PCP  Buford Dresser, MD is cardiologist Ronne Binning, MD ([facility]) and Marcial Pacas, MD [facility]) are neurologists  LABS: Labs on arrival as indicated. Last lab results in [facility] include: Lab Results  Component Value Date   WBC 12.7 (H) 11/27/2022   HGB 13.6 11/27/2022   HCT 40.2 11/27/2022   PLT 259 11/27/2022   GLUCOSE 155 (H) 11/27/2022   ALT 41 11/27/2022   AST 39 11/27/2022   NA 136 11/27/2022   K 4.1 11/27/2022   CL 104 11/27/2022    CREATININE 1.10 (H) 11/27/2022   BUN 27 (H) 11/27/2022   CO2 22 11/27/2022   TSH 1.180 05/08/2022   INR 1.0 08/30/2020     IMAGES: CT Chest/abd/pelvis 06/28/22: IMPRESSION: 1. No acute abnormality. No evidence of metastatic disease in the chest, abdomen or pelvis. 2. Chronic findings include: Stable small left adrenal adenoma, for which no follow-up is recommended. Mild left colonic diverticulosis. Aortic Atherosclerosis (ICD10-I70.0).    EKG: 11/27/22: Normal Sinus rhythm Left axis deviation Septal infarct , age undetermined Abnormal ECG When compared with ECG of 11-Mar-2022 16:28, No significant change was found Confirmed by Delora Fuel ([id number]) on 11/27/2022 11:49:17 PM   CV: CT Coronary morphology 05/07/19: IMPRESSION: 1. Coronary calcium score of 0. This was 0 percentile for age and sex matched control.   2. Normal coronary origin with right dominance.   3. No evidence of CAD.  Past Medical History:  Diagnosis Date   Allergic rhinitis    ALS (amyotrophic lateral sclerosis) (HCC)    Arthritis    Cancer (HCC)    Breast- left   CKD (chronic kidney disease)    stage III   Colon polyp    Dyspnea    GERD (gastroesophageal reflux disease)    History of hiatal hernia    Hyperlipidemia    Personal history of radiation therapy    Stroke [facility])    TIA before 2013   TIA (transient ischemic attack)    Vitamin D deficiency     Past Surgical History:  Procedure Laterality Date   APPENDECTOMY     BREAST LUMPECTOMY Left 2006   BREAST SURGERY     2006...treated with Tamoxifen for 6 years   CHOLECYSTECTOMY     COLONOSCOPY     ESOPHAGOGASTRODUODENOSCOPY N/A 09/01/2020   Procedure: ESOPHAGOGASTRODUODENOSCOPY (EGD);  Surgeon: Lajuana Matte, MD;  Location: [place];  Service: Thoracic;  Laterality: N/A;   XI ROBOTIC ASSISTED PARAESOPHAGEAL HERNIA REPAIR N/A 09/01/2020   Procedure: XI ROBOTIC ASSISTED HIATAL HERNIA REPAIR WITH FUNDOPLICATION AND GASTROPEXY;  Surgeon:  Lajuana Matte, MD;  Location: [place];  Service: Thoracic;  Laterality: N/A;    MEDICATIONS: No current facility-administered medications for this encounter.    acetaminophen (TYLENOL) 500 MG tablet   atorvastatin (LIPITOR) 20 MG tablet   clopidogrel (PLAVIX) 75 MG tablet   lipase/protease/amylase (CREON) 36000 UNITS CPEP capsule   riluzole (RILUTEK) 50 MG tablet   B Complex-C (SUPER B COMPLEX PO)   Cyanocobalamin (B-12) 5000 MCG CAPS    Myra Gianotti, PA-C Surgical Short Stay/Anesthesiology [facility] Phone [phone] [facility] Phone [phone] 01/10/2023 12:29 PM

## 2023-01-11 ENCOUNTER — Ambulatory Visit (HOSPITAL_BASED_OUTPATIENT_CLINIC_OR_DEPARTMENT_OTHER): Payer: Medicare Other | Admitting: Vascular Surgery

## 2023-01-11 ENCOUNTER — Other Ambulatory Visit: Payer: Self-pay

## 2023-01-11 ENCOUNTER — Ambulatory Visit (HOSPITAL_COMMUNITY)
Admission: RE | Admit: 2023-01-11 | Discharge: 2023-01-11 | Disposition: A | Discharge status: Home / Self Care | Payer: Medicare Other | Source: Ambulatory Visit | Attending: Family Medicine | Admitting: Family Medicine

## 2023-01-11 ENCOUNTER — Encounter (HOSPITAL_COMMUNITY): Payer: Self-pay

## 2023-01-11 ENCOUNTER — Ambulatory Visit (HOSPITAL_COMMUNITY): Payer: Medicare Other | Admitting: Vascular Surgery

## 2023-01-11 ENCOUNTER — Ambulatory Visit (HOSPITAL_COMMUNITY)
Admission: RE | Admit: 2023-01-11 | Discharge: 2023-01-11 | Disposition: A | Discharge status: Home / Self Care | Payer: Medicare Other | Attending: Family Medicine | Admitting: Family Medicine

## 2023-01-11 ENCOUNTER — Other Ambulatory Visit: Payer: Self-pay | Admitting: Radiology

## 2023-01-11 ENCOUNTER — Encounter (HOSPITAL_COMMUNITY)
Admission: RE | Disposition: A | Discharge status: Home / Self Care | Payer: Self-pay | Source: Home / Self Care | Attending: Family Medicine

## 2023-01-11 DIAGNOSIS — Z79899 Other long term (current) drug therapy: Secondary | ICD-10-CM | POA: Diagnosis not present

## 2023-01-11 DIAGNOSIS — G1221 Amyotrophic lateral sclerosis: Secondary | ICD-10-CM | POA: Insufficient documentation

## 2023-01-11 DIAGNOSIS — Z7902 Long term (current) use of antithrombotics/antiplatelets: Secondary | ICD-10-CM | POA: Insufficient documentation

## 2023-01-11 DIAGNOSIS — N189 Chronic kidney disease, unspecified: Secondary | ICD-10-CM | POA: Insufficient documentation

## 2023-01-11 DIAGNOSIS — N183 Chronic kidney disease, stage 3 unspecified: Secondary | ICD-10-CM | POA: Diagnosis not present

## 2023-01-11 DIAGNOSIS — K219 Gastro-esophageal reflux disease without esophagitis: Secondary | ICD-10-CM | POA: Insufficient documentation

## 2023-01-11 DIAGNOSIS — Z853 Personal history of malignant neoplasm of breast: Secondary | ICD-10-CM | POA: Diagnosis not present

## 2023-01-11 DIAGNOSIS — Z8673 Personal history of transient ischemic attack (TIA), and cerebral infarction without residual deficits: Secondary | ICD-10-CM | POA: Insufficient documentation

## 2023-01-11 DIAGNOSIS — R1310 Dysphagia, unspecified: Secondary | ICD-10-CM

## 2023-01-11 DIAGNOSIS — E785 Hyperlipidemia, unspecified: Secondary | ICD-10-CM | POA: Insufficient documentation

## 2023-01-11 DIAGNOSIS — G1220 Motor neuron disease, unspecified: Secondary | ICD-10-CM

## 2023-01-11 DIAGNOSIS — Z923 Personal history of irradiation: Secondary | ICD-10-CM | POA: Insufficient documentation

## 2023-01-11 HISTORY — DX: Amyotrophic lateral sclerosis: G12.21

## 2023-01-11 HISTORY — PX: IR GASTROSTOMY TUBE MOD SED: IMG625

## 2023-01-11 HISTORY — PX: RADIOLOGY WITH ANESTHESIA: SHX6223

## 2023-01-11 LAB — BASIC METABOLIC PANEL
Anion gap: 8 (ref 5–15)
BUN: 8 mg/dL (ref 8–23)
CO2: 24 mmol/L (ref 22–32)
Calcium: 8.9 mg/dL (ref 8.9–10.3)
Chloride: 107 mmol/L (ref 98–111)
Creatinine, Ser: 0.84 mg/dL (ref 0.44–1.00)
GFR, Estimated: >60 mL/min (ref >60)
Glucose, Bld: 98 mg/dL (ref 70–99)
Potassium: 3.5 mmol/L (ref 3.5–5.1)
Sodium: 139 mmol/L (ref 135–145)

## 2023-01-11 LAB — CBC
HCT: 31.5 % — ABNORMAL LOW (ref 36.0–46.0)
Hemoglobin: 10.6 g/dL — ABNORMAL LOW (ref 12.0–15.0)
MCH: 26.3 pg (ref 26.0–34.0)
MCHC: 33.7 g/dL (ref 30.0–36.0)
MCV: 78.2 fL — ABNORMAL LOW (ref 80.0–100.0)
Platelets: 322 10*3/uL (ref 150–400)
RBC: 4.03 MIL/uL (ref 3.87–5.11)
RDW: 16.6 % — ABNORMAL HIGH (ref 11.5–15.5)
WBC: 6.3 10*3/uL (ref 4.0–10.5)
nRBC: 0 % (ref 0.0–0.2)

## 2023-01-11 LAB — PROTIME-INR
INR: 1 (ref 0.8–1.2)
Prothrombin Time: 12.6 seconds (ref 11.4–15.2)

## 2023-01-11 SURGERY — IR WITH ANESTHESIA
Anesthesia: Monitor Anesthesia Care

## 2023-01-11 MED ORDER — GLUCAGON HCL RDNA (DIAGNOSTIC) 1 MG IJ SOLR
INTRAMUSCULAR | Status: AC
  Filled 2023-01-11: qty 1

## 2023-01-11 MED ORDER — IOHEXOL 300 MG/ML  SOLN
50.0000 mL | Freq: Once | INTRAMUSCULAR | Status: AC | PRN
  Administered 2023-01-11: 15 mL

## 2023-01-11 MED ORDER — CEFAZOLIN SODIUM-DEXTROSE 2-4 GM/100ML-% IV SOLN
2.0000 g | INTRAVENOUS | Status: AC
  Administered 2023-01-11: 2 g via INTRAVENOUS

## 2023-01-11 MED ORDER — CEFAZOLIN SODIUM-DEXTROSE 2-4 GM/100ML-% IV SOLN
INTRAVENOUS | Status: AC
  Filled 2023-01-11: qty 100

## 2023-01-11 MED ORDER — GLUCAGON HCL RDNA (DIAGNOSTIC) 1 MG IJ SOLR
INTRAMUSCULAR | Status: DC | PRN
  Administered 2023-01-11: .5 mg via INTRAVENOUS

## 2023-01-11 MED ORDER — PROMETHAZINE HCL 25 MG/ML IJ SOLN: 6.2500 mg | INTRAMUSCULAR | Status: DC | PRN

## 2023-01-11 MED ORDER — CHLORHEXIDINE GLUCONATE 0.12 % MT SOLN: 15.0000 mL | Freq: Once | OROMUCOSAL | Status: AC

## 2023-01-11 MED ORDER — FENTANYL CITRATE (PF) 100 MCG/2ML IJ SOLN
25.0000 ug | INTRAMUSCULAR | Status: DC | PRN
  Administered 2023-01-11: 25 ug via INTRAVENOUS

## 2023-01-11 MED ORDER — PROPOFOL 500 MG/50ML IV EMUL
INTRAVENOUS | Status: DC | PRN
  Administered 2023-01-11: 75 ug/kg/min via INTRAVENOUS

## 2023-01-11 MED ORDER — ORAL CARE MOUTH RINSE: 15.0000 mL | Freq: Once | OROMUCOSAL | Status: AC

## 2023-01-11 MED ORDER — FENTANYL CITRATE (PF) 250 MCG/5ML IJ SOLN
INTRAMUSCULAR | Status: DC | PRN
  Administered 2023-01-11: 25 ug via INTRAVENOUS

## 2023-01-11 MED ORDER — FENTANYL CITRATE (PF) 100 MCG/2ML IJ SOLN
INTRAMUSCULAR | Status: AC
  Filled 2023-01-11: qty 2

## 2023-01-11 MED ORDER — SODIUM CHLORIDE 0.9 % IV SOLN: INTRAVENOUS | Status: DC

## 2023-01-11 MED ORDER — CHLORHEXIDINE GLUCONATE 0.12 % MT SOLN
OROMUCOSAL | Status: AC
  Administered 2023-01-11: 15 mL via OROMUCOSAL
  Filled 2023-01-11: qty 15

## 2023-01-11 MED ORDER — HYDROCODONE-ACETAMINOPHEN 5-325 MG PO TABS: 1.0000 | ORAL_TABLET | Freq: Four times a day (QID) | ORAL | 0 refills | Status: AC | PRN

## 2023-01-11 MED ORDER — LIDOCAINE 2% (20 MG/ML) 5 ML SYRINGE
INTRAMUSCULAR | Status: DC | PRN
  Administered 2023-01-11: 40 mg via INTRAVENOUS

## 2023-01-11 MED ORDER — PHENYLEPHRINE HCL-NACL 20-0.9 MG/250ML-% IV SOLN
INTRAVENOUS | Status: DC | PRN
  Administered 2023-01-11: 25 ug/min via INTRAVENOUS

## 2023-01-11 MED ORDER — ONDANSETRON HCL 4 MG/2ML IJ SOLN
INTRAMUSCULAR | Status: DC | PRN
  Administered 2023-01-11: 4 mg via INTRAVENOUS

## 2023-01-11 MED ORDER — AMISULPRIDE (ANTIEMETIC) 5 MG/2ML IV SOLN: 10.0000 mg | Freq: Once | INTRAVENOUS | Status: DC | PRN

## 2023-01-11 MED ORDER — LACTATED RINGERS IV SOLN: INTRAVENOUS | Status: DC

## 2023-01-11 MED ORDER — LIDOCAINE HCL 1 % IJ SOLN
INTRAMUSCULAR | Status: AC
  Administered 2023-01-11: 10 mL
  Filled 2023-01-11: qty 20

## 2023-01-11 NOTE — Transfer of Care (Signed)
Immediate Anesthesia Transfer of Care Note  Patient: Yvonne Jordan  Procedure(s) Performed: IR WITH ANESTHESIA INTERVENTIONAL PEG PLACEMENT  Patient Location: PACU  Anesthesia Type:MAC  Level of Consciousness: awake, alert , and oriented  Airway & Oxygen Therapy: Patient Spontanous Breathing and Patient connected to nasal cannula oxygen  Post-op Assessment: Report given to RN, Post -op Vital signs reviewed and stable, and Patient moving all extremities X 4  Post vital signs: Reviewed and stable  Last Vitals:  Vitals Value Taken Time  BP 128/79 01/11/23 1001  Temp 36.7 C 01/11/23 1000  Pulse 84 01/11/23 1005  Resp 20 01/11/23 1005  SpO2 100 % 01/11/23 1005  Vitals shown include unvalidated device data.  Last Pain:  Vitals:   01/11/23 0656  TempSrc:   PainSc: 0-No pain         Complications: No notable events documented.

## 2023-01-11 NOTE — Anesthesia Postprocedure Evaluation (Signed)
Anesthesia Post Note  Patient: Yvonne Jordan  Procedure(s) Performed: IR WITH ANESTHESIA INTERVENTIONAL PEG PLACEMENT     Patient location during evaluation: PACU Anesthesia Type: MAC Level of consciousness: awake and alert Pain management: pain level controlled Vital Signs Assessment: post-procedure vital signs reviewed and stable Respiratory status: spontaneous breathing and respiratory function stable Cardiovascular status: stable Postop Assessment: no apparent nausea or vomiting Anesthetic complications: no   No notable events documented.  Last Vitals:  Vitals:   01/11/23 1015 01/11/23 1030  BP: 134/71 132/74  Pulse: 84 74  Resp: (!) 22 19  Temp:    SpO2: 100% 100%    Last Pain:  Vitals:   01/11/23 1030  TempSrc:   PainSc: 0-No pain                 Saidee Geremia DANIEL

## 2023-01-11 NOTE — Procedures (Signed)
Interventional Radiology Procedure:   Indications: Dysphagia and ALS3  Procedure: Gastrostomy tube placement  Findings: 20 Fr gastrostomy tube in stomach.    Complications: No immediate complications noted.     EBL: Minimal  Plan: PACU for recovery.  May discharge after 2 hours   Kaleb Linquist R. Anselm Pancoast, MD  Pager: [phone]

## 2023-01-14 ENCOUNTER — Encounter (HOSPITAL_COMMUNITY): Payer: Self-pay | Admitting: Radiology

## 2023-01-16 DIAGNOSIS — G1220 Motor neuron disease, unspecified: Secondary | ICD-10-CM | POA: Diagnosis not present

## 2023-01-16 DIAGNOSIS — E559 Vitamin D deficiency, unspecified: Secondary | ICD-10-CM | POA: Diagnosis not present

## 2023-01-16 DIAGNOSIS — I129 Hypertensive chronic kidney disease with stage 1 through stage 4 chronic kidney disease, or unspecified chronic kidney disease: Secondary | ICD-10-CM | POA: Diagnosis not present

## 2023-01-16 DIAGNOSIS — M50322 Other cervical disc degeneration at C5-C6 level: Secondary | ICD-10-CM | POA: Diagnosis not present

## 2023-01-16 DIAGNOSIS — N1831 Chronic kidney disease, stage 3a: Secondary | ICD-10-CM | POA: Diagnosis not present

## 2023-01-16 DIAGNOSIS — G43909 Migraine, unspecified, not intractable, without status migrainosus: Secondary | ICD-10-CM | POA: Diagnosis not present

## 2023-01-18 DIAGNOSIS — G1220 Motor neuron disease, unspecified: Secondary | ICD-10-CM | POA: Diagnosis not present

## 2023-01-18 DIAGNOSIS — E559 Vitamin D deficiency, unspecified: Secondary | ICD-10-CM | POA: Diagnosis not present

## 2023-01-18 DIAGNOSIS — I129 Hypertensive chronic kidney disease with stage 1 through stage 4 chronic kidney disease, or unspecified chronic kidney disease: Secondary | ICD-10-CM | POA: Diagnosis not present

## 2023-01-18 DIAGNOSIS — M50322 Other cervical disc degeneration at C5-C6 level: Secondary | ICD-10-CM | POA: Diagnosis not present

## 2023-01-18 DIAGNOSIS — G43909 Migraine, unspecified, not intractable, without status migrainosus: Secondary | ICD-10-CM | POA: Diagnosis not present

## 2023-01-18 DIAGNOSIS — N1831 Chronic kidney disease, stage 3a: Secondary | ICD-10-CM | POA: Diagnosis not present

## 2023-01-22 DIAGNOSIS — E559 Vitamin D deficiency, unspecified: Secondary | ICD-10-CM | POA: Diagnosis not present

## 2023-01-22 DIAGNOSIS — N1831 Chronic kidney disease, stage 3a: Secondary | ICD-10-CM | POA: Diagnosis not present

## 2023-01-22 DIAGNOSIS — G43909 Migraine, unspecified, not intractable, without status migrainosus: Secondary | ICD-10-CM | POA: Diagnosis not present

## 2023-01-22 DIAGNOSIS — M50322 Other cervical disc degeneration at C5-C6 level: Secondary | ICD-10-CM | POA: Diagnosis not present

## 2023-01-22 DIAGNOSIS — G1220 Motor neuron disease, unspecified: Secondary | ICD-10-CM | POA: Diagnosis not present

## 2023-01-22 DIAGNOSIS — I129 Hypertensive chronic kidney disease with stage 1 through stage 4 chronic kidney disease, or unspecified chronic kidney disease: Secondary | ICD-10-CM | POA: Diagnosis not present

## 2023-01-23 DIAGNOSIS — N1831 Chronic kidney disease, stage 3a: Secondary | ICD-10-CM | POA: Diagnosis not present

## 2023-01-23 DIAGNOSIS — I129 Hypertensive chronic kidney disease with stage 1 through stage 4 chronic kidney disease, or unspecified chronic kidney disease: Secondary | ICD-10-CM | POA: Diagnosis not present

## 2023-01-23 DIAGNOSIS — E559 Vitamin D deficiency, unspecified: Secondary | ICD-10-CM | POA: Diagnosis not present

## 2023-01-23 DIAGNOSIS — G1220 Motor neuron disease, unspecified: Secondary | ICD-10-CM | POA: Diagnosis not present

## 2023-01-23 DIAGNOSIS — M50322 Other cervical disc degeneration at C5-C6 level: Secondary | ICD-10-CM | POA: Diagnosis not present

## 2023-01-23 DIAGNOSIS — G43909 Migraine, unspecified, not intractable, without status migrainosus: Secondary | ICD-10-CM | POA: Diagnosis not present

## 2023-01-25 DIAGNOSIS — E559 Vitamin D deficiency, unspecified: Secondary | ICD-10-CM | POA: Diagnosis not present

## 2023-01-25 DIAGNOSIS — G43909 Migraine, unspecified, not intractable, without status migrainosus: Secondary | ICD-10-CM | POA: Diagnosis not present

## 2023-01-25 DIAGNOSIS — N1831 Chronic kidney disease, stage 3a: Secondary | ICD-10-CM | POA: Diagnosis not present

## 2023-01-25 DIAGNOSIS — M50322 Other cervical disc degeneration at C5-C6 level: Secondary | ICD-10-CM | POA: Diagnosis not present

## 2023-01-25 DIAGNOSIS — I129 Hypertensive chronic kidney disease with stage 1 through stage 4 chronic kidney disease, or unspecified chronic kidney disease: Secondary | ICD-10-CM | POA: Diagnosis not present

## 2023-01-25 DIAGNOSIS — G1220 Motor neuron disease, unspecified: Secondary | ICD-10-CM | POA: Diagnosis not present

## 2023-01-31 DIAGNOSIS — G1220 Motor neuron disease, unspecified: Secondary | ICD-10-CM | POA: Diagnosis not present

## 2023-01-31 DIAGNOSIS — G43909 Migraine, unspecified, not intractable, without status migrainosus: Secondary | ICD-10-CM | POA: Diagnosis not present

## 2023-01-31 DIAGNOSIS — I129 Hypertensive chronic kidney disease with stage 1 through stage 4 chronic kidney disease, or unspecified chronic kidney disease: Secondary | ICD-10-CM | POA: Diagnosis not present

## 2023-01-31 DIAGNOSIS — N1831 Chronic kidney disease, stage 3a: Secondary | ICD-10-CM | POA: Diagnosis not present

## 2023-01-31 DIAGNOSIS — E559 Vitamin D deficiency, unspecified: Secondary | ICD-10-CM | POA: Diagnosis not present

## 2023-01-31 DIAGNOSIS — M50322 Other cervical disc degeneration at C5-C6 level: Secondary | ICD-10-CM | POA: Diagnosis not present

## 2023-02-04 DIAGNOSIS — G1220 Motor neuron disease, unspecified: Secondary | ICD-10-CM | POA: Diagnosis not present

## 2023-02-04 DIAGNOSIS — I129 Hypertensive chronic kidney disease with stage 1 through stage 4 chronic kidney disease, or unspecified chronic kidney disease: Secondary | ICD-10-CM | POA: Diagnosis not present

## 2023-02-04 DIAGNOSIS — N1831 Chronic kidney disease, stage 3a: Secondary | ICD-10-CM | POA: Diagnosis not present

## 2023-02-04 DIAGNOSIS — E559 Vitamin D deficiency, unspecified: Secondary | ICD-10-CM | POA: Diagnosis not present

## 2023-02-04 DIAGNOSIS — M50322 Other cervical disc degeneration at C5-C6 level: Secondary | ICD-10-CM | POA: Diagnosis not present

## 2023-02-04 DIAGNOSIS — G43909 Migraine, unspecified, not intractable, without status migrainosus: Secondary | ICD-10-CM | POA: Diagnosis not present

## 2023-02-05 DIAGNOSIS — Z8673 Personal history of transient ischemic attack (TIA), and cerebral infarction without residual deficits: Secondary | ICD-10-CM | POA: Diagnosis not present

## 2023-02-05 DIAGNOSIS — Z9181 History of falling: Secondary | ICD-10-CM | POA: Diagnosis not present

## 2023-02-05 DIAGNOSIS — G43909 Migraine, unspecified, not intractable, without status migrainosus: Secondary | ICD-10-CM | POA: Diagnosis not present

## 2023-02-05 DIAGNOSIS — M21372 Foot drop, left foot: Secondary | ICD-10-CM | POA: Diagnosis not present

## 2023-02-05 DIAGNOSIS — R1310 Dysphagia, unspecified: Secondary | ICD-10-CM | POA: Diagnosis not present

## 2023-02-05 DIAGNOSIS — I700 Atherosclerosis of aorta: Secondary | ICD-10-CM | POA: Diagnosis not present

## 2023-02-05 DIAGNOSIS — Z7902 Long term (current) use of antithrombotics/antiplatelets: Secondary | ICD-10-CM | POA: Diagnosis not present

## 2023-02-05 DIAGNOSIS — J849 Interstitial pulmonary disease, unspecified: Secondary | ICD-10-CM | POA: Diagnosis not present

## 2023-02-05 DIAGNOSIS — N1831 Chronic kidney disease, stage 3a: Secondary | ICD-10-CM | POA: Diagnosis not present

## 2023-02-05 DIAGNOSIS — Z431 Encounter for attention to gastrostomy: Secondary | ICD-10-CM | POA: Diagnosis not present

## 2023-02-05 DIAGNOSIS — J439 Emphysema, unspecified: Secondary | ICD-10-CM | POA: Diagnosis not present

## 2023-02-05 DIAGNOSIS — M50322 Other cervical disc degeneration at C5-C6 level: Secondary | ICD-10-CM | POA: Diagnosis not present

## 2023-02-05 DIAGNOSIS — J309 Allergic rhinitis, unspecified: Secondary | ICD-10-CM | POA: Diagnosis not present

## 2023-02-05 DIAGNOSIS — K8681 Exocrine pancreatic insufficiency: Secondary | ICD-10-CM | POA: Diagnosis not present

## 2023-02-05 DIAGNOSIS — G1220 Motor neuron disease, unspecified: Secondary | ICD-10-CM | POA: Diagnosis not present

## 2023-02-05 DIAGNOSIS — Z993 Dependence on wheelchair: Secondary | ICD-10-CM | POA: Diagnosis not present

## 2023-02-05 DIAGNOSIS — E7800 Pure hypercholesterolemia, unspecified: Secondary | ICD-10-CM | POA: Diagnosis not present

## 2023-02-05 DIAGNOSIS — M85851 Other specified disorders of bone density and structure, right thigh: Secondary | ICD-10-CM | POA: Diagnosis not present

## 2023-02-05 DIAGNOSIS — Z853 Personal history of malignant neoplasm of breast: Secondary | ICD-10-CM | POA: Diagnosis not present

## 2023-02-05 DIAGNOSIS — K219 Gastro-esophageal reflux disease without esophagitis: Secondary | ICD-10-CM | POA: Diagnosis not present

## 2023-02-05 DIAGNOSIS — E559 Vitamin D deficiency, unspecified: Secondary | ICD-10-CM | POA: Diagnosis not present

## 2023-02-05 DIAGNOSIS — I129 Hypertensive chronic kidney disease with stage 1 through stage 4 chronic kidney disease, or unspecified chronic kidney disease: Secondary | ICD-10-CM | POA: Diagnosis not present

## 2023-02-07 DIAGNOSIS — G1220 Motor neuron disease, unspecified: Secondary | ICD-10-CM | POA: Diagnosis not present

## 2023-02-07 DIAGNOSIS — Z431 Encounter for attention to gastrostomy: Secondary | ICD-10-CM | POA: Diagnosis not present

## 2023-02-07 DIAGNOSIS — G43909 Migraine, unspecified, not intractable, without status migrainosus: Secondary | ICD-10-CM | POA: Diagnosis not present

## 2023-02-07 DIAGNOSIS — N1831 Chronic kidney disease, stage 3a: Secondary | ICD-10-CM | POA: Diagnosis not present

## 2023-02-07 DIAGNOSIS — E559 Vitamin D deficiency, unspecified: Secondary | ICD-10-CM | POA: Diagnosis not present

## 2023-02-07 DIAGNOSIS — I129 Hypertensive chronic kidney disease with stage 1 through stage 4 chronic kidney disease, or unspecified chronic kidney disease: Secondary | ICD-10-CM | POA: Diagnosis not present

## 2023-02-11 DIAGNOSIS — I129 Hypertensive chronic kidney disease with stage 1 through stage 4 chronic kidney disease, or unspecified chronic kidney disease: Secondary | ICD-10-CM | POA: Diagnosis not present

## 2023-02-11 DIAGNOSIS — G43909 Migraine, unspecified, not intractable, without status migrainosus: Secondary | ICD-10-CM | POA: Diagnosis not present

## 2023-02-11 DIAGNOSIS — N1831 Chronic kidney disease, stage 3a: Secondary | ICD-10-CM | POA: Diagnosis not present

## 2023-02-11 DIAGNOSIS — E559 Vitamin D deficiency, unspecified: Secondary | ICD-10-CM | POA: Diagnosis not present

## 2023-02-11 DIAGNOSIS — Z431 Encounter for attention to gastrostomy: Secondary | ICD-10-CM | POA: Diagnosis not present

## 2023-02-11 DIAGNOSIS — G1220 Motor neuron disease, unspecified: Secondary | ICD-10-CM | POA: Diagnosis not present

## 2023-02-15 DIAGNOSIS — G1220 Motor neuron disease, unspecified: Secondary | ICD-10-CM | POA: Diagnosis not present

## 2023-02-15 DIAGNOSIS — Z431 Encounter for attention to gastrostomy: Secondary | ICD-10-CM | POA: Diagnosis not present

## 2023-02-15 DIAGNOSIS — I129 Hypertensive chronic kidney disease with stage 1 through stage 4 chronic kidney disease, or unspecified chronic kidney disease: Secondary | ICD-10-CM | POA: Diagnosis not present

## 2023-02-15 DIAGNOSIS — N1831 Chronic kidney disease, stage 3a: Secondary | ICD-10-CM | POA: Diagnosis not present

## 2023-02-15 DIAGNOSIS — E559 Vitamin D deficiency, unspecified: Secondary | ICD-10-CM | POA: Diagnosis not present

## 2023-02-15 DIAGNOSIS — G43909 Migraine, unspecified, not intractable, without status migrainosus: Secondary | ICD-10-CM | POA: Diagnosis not present

## 2023-02-18 ENCOUNTER — Telehealth: Payer: Self-pay

## 2023-02-18 NOTE — Telephone Encounter (Signed)
1128 am.  Phone call made to patient to schedule a home visit for initial Palliative Care referral.  No answer.  Message left on VM.

## 2023-02-19 DIAGNOSIS — K219 Gastro-esophageal reflux disease without esophagitis: Secondary | ICD-10-CM | POA: Diagnosis not present

## 2023-02-19 DIAGNOSIS — G709 Myoneural disorder, unspecified: Secondary | ICD-10-CM | POA: Diagnosis not present

## 2023-02-19 DIAGNOSIS — R262 Difficulty in walking, not elsewhere classified: Secondary | ICD-10-CM | POA: Diagnosis not present

## 2023-02-19 DIAGNOSIS — K117 Disturbances of salivary secretion: Secondary | ICD-10-CM | POA: Diagnosis not present

## 2023-02-19 DIAGNOSIS — N189 Chronic kidney disease, unspecified: Secondary | ICD-10-CM | POA: Diagnosis not present

## 2023-02-19 DIAGNOSIS — E042 Nontoxic multinodular goiter: Secondary | ICD-10-CM | POA: Diagnosis not present

## 2023-02-19 DIAGNOSIS — R1312 Dysphagia, oropharyngeal phase: Secondary | ICD-10-CM | POA: Diagnosis not present

## 2023-02-19 DIAGNOSIS — J984 Other disorders of lung: Secondary | ICD-10-CM | POA: Diagnosis not present

## 2023-02-19 DIAGNOSIS — G1221 Amyotrophic lateral sclerosis: Secondary | ICD-10-CM | POA: Diagnosis not present

## 2023-02-19 DIAGNOSIS — Z5181 Encounter for therapeutic drug level monitoring: Secondary | ICD-10-CM | POA: Diagnosis not present

## 2023-02-20 ENCOUNTER — Telehealth: Payer: Self-pay

## 2023-02-20 ENCOUNTER — Other Ambulatory Visit: Payer: Medicare Other

## 2023-02-20 DIAGNOSIS — Z515 Encounter for palliative care: Secondary | ICD-10-CM

## 2023-02-20 NOTE — Telephone Encounter (Signed)
Non applicable 

## 2023-02-20 NOTE — Progress Notes (Signed)
COMMUNITY PALLIATIVE CARE SW NOTE  PATIENT NAME: Yvonne Jordan DOB: 01-07-1946 MRN: [id number]  PRIMARY CARE PROVIDER: Antony Contras, MD  RESPONSIBLE PARTY:  Acct ID - Guarantor Home Phone Work Phone Relationship Acct Type  [id number] Tessa Lerner* [id number] [phone] Self P/F     [place], [place], [place] [address]   Initial Palliative Care Encounter/Clinical Social Work   [employer] SW completed an initial palliative care encounter with patient. SW provided education regarding the palliative care program, which she was open to. SW obtained a status update on patient. She stated that she went to [facility] yesterday for a visit. Her next visit is in 3 months. She denied pain. She report that her doctors are encouraging her to gain weight. Patient has a feeding tube, but is eating orally. She report that she is sleeping well. Patient is receiving PT 2x week with [facility]. Patient's cousin Lennette Bihari was present for this encounter. She report that she does have a caregiver-Dawn that comes daily. SW assessed for any immediate needs. Patient advises she would like to the nurse to take a look at her feeding tube.   SW provided the contact information for the palliative care team. A follow-up appointment was scheduled for 02/27/23 @ 1pm. Social History   Tobacco Use   Smoking status: Never   Smokeless tobacco: Never  Substance Use Topics   Alcohol use: Not Currently    Comment: 1 drink -every few weeks    CODE STATUS: To be assessed ADVANCED DIRECTIVES: No MOST FORM COMPLETE:  To be assessed HOSPICE EDUCATION PROVIDED: To be assessed  Duration of encounter and documentation: 30 minutes  [employer], LCSW

## 2023-02-22 DIAGNOSIS — G43909 Migraine, unspecified, not intractable, without status migrainosus: Secondary | ICD-10-CM | POA: Diagnosis not present

## 2023-02-22 DIAGNOSIS — G1220 Motor neuron disease, unspecified: Secondary | ICD-10-CM | POA: Diagnosis not present

## 2023-02-22 DIAGNOSIS — I129 Hypertensive chronic kidney disease with stage 1 through stage 4 chronic kidney disease, or unspecified chronic kidney disease: Secondary | ICD-10-CM | POA: Diagnosis not present

## 2023-02-22 DIAGNOSIS — N1831 Chronic kidney disease, stage 3a: Secondary | ICD-10-CM | POA: Diagnosis not present

## 2023-02-22 DIAGNOSIS — E559 Vitamin D deficiency, unspecified: Secondary | ICD-10-CM | POA: Diagnosis not present

## 2023-02-22 DIAGNOSIS — Z431 Encounter for attention to gastrostomy: Secondary | ICD-10-CM | POA: Diagnosis not present

## 2023-02-27 ENCOUNTER — Other Ambulatory Visit: Payer: Medicare Other

## 2023-02-27 VITALS — BP 122/78 | HR 91 | Temp 98.8°F

## 2023-02-27 DIAGNOSIS — Z515 Encounter for palliative care: Secondary | ICD-10-CM

## 2023-02-27 NOTE — Progress Notes (Signed)
PATIENT NAME: Yvonne Jordan DOB: Sep 15, 1946 MRN: [id number]  PRIMARY CARE PROVIDER: Antony Contras, MD  RESPONSIBLE PARTY:  Acct ID - Guarantor Home Phone Work Phone Relationship Acct Type  [id number] Tessa Lerner* [phone] Self P/F     [place], [place], [place] [address]    Palliative Care Initial Encounter Note   Completed home visit. Caregiver and husband in the home but not a part of the conversation.     HISTORY OF PRESENT ILLNESS:    Respiratory: occasional SOB otherwise no issues  Cognitive: alert and oriented; able to make needs known but difficult to understand at times   Appetite: has feeding tube but eats PO at this time; drinks thin liquids and coughs at times; regular diet at least 2 full meals; has Boost and is going to drink it to try to gain weight  GI/GU: continent w/occasional accidents; BM daily; denies urinary issues; wears an adult pull up   Mobility: used to ambulate w/walker but only uses an electric wheel chair at this time  ADLs: independent grooming at this time; able to put food in microwave but needs some one to clean and fix full meals   Sleeping Pattern: doesn't sleep through the night but can go back to sleep  Pain: denies pain at this time  Palliative Care/ Hospice: RN explained role and purpose of palliative care including visit frequency. Also discussed benefits of hospice care as well as the differences between the two with patient.   Goals of Care: To stay in the home as long as possible until she feels she should go to a facility  CODE STATUS: Full Code ADVANCED DIRECTIVES: N MOST FORM: N PPS: 40%   PHYSICAL EXAM:   VITALS: Today's Vitals   02/27/23 1258  BP: 122/78  Pulse: 91  Temp: 98.8 F (37.1 C)  SpO2: 98%    LUNGS: clear to auscultation , decreased breath sounds CARDIAC: Cor RRR EXTREMITIES: BLE ankles 1+ pitting edema SKIN: Skin color, texture, turgor normal. No rashes or lesions   NEURO: negative       Kindrick Lankford Georgann Housekeeper, LPN

## 2023-02-27 NOTE — Progress Notes (Signed)
COMMUNITY PALLIATIVE CARE SW NOTE  PATIENT NAME: Yvonne Jordan DOB: [birth year] MRN: [id number]  PRIMARY CARE PROVIDER: Antony Contras, MD  RESPONSIBLE PARTY:  Acct ID - Guarantor Home Phone Work Phone Relationship Acct Type  [id number] Tessa Lerner* [phone] Self P/F     [place], [place],  [address]   Palliative Care Visit/Clinical Social Work  SW and Nurse-D. Georgann Housekeeper completed a follow-up visit with patient at her home. SW provided reiterated palliative care services, role in care and visit frequency. Patient provided and signed consent to services.  Patient became tearful when palliative care is discussed. The team assured her of their role as palliative care and not hospice. Patient stated "I'm not hospice yet". Patient was receptive to education regarding the differences in palliative and hospice.   Patient is able to transfer herself from her chair to her power chair. Patient has vertigo and has a history of falls. Patient has a feeding tube, but she is eating orally. Patient is able to drink regular liquids, with a little bit of choking. She is eating regular foods, at least 2 a day. Patient also has included a Boost. Patient wears adult briefs, she has occasional accidents. Patient is independent for personal care. Patient denied any pain issues. Patient report that she awakes several times at night.  Patient has a sitter 7days a week. She goes to the [facility] in [place].  Patient currently lives in the home with her husband, who is bedbound and is a hospice. Patient receives PT and RN services from [facility].  Patient has two children [facility] and [place]). She has been married to her husband for 25 years. Patient is a retired Mining engineer.   Patient has advance directives. She desire to be a Full Code. She would like to appoint her niece as her 16. SW provided education regarding an advance directive and left the form in the home for her  review.   Next Appointment : 04/10/23 @ 12 pm.  Social History   Tobacco Use   Smoking status: Never   Smokeless tobacco: Never  Substance Use Topics   Alcohol use: Not Currently    Comment: 1 drink -every few weeks    CODE STATUS: Full Code ADVANCED DIRECTIVES: No MOST FORM COMPLETE:  No HOSPICE EDUCATION PROVIDED: No  Duration of visit and documentation: 60 minutes  Samson Ralph, LCSW

## 2023-02-28 DIAGNOSIS — G1220 Motor neuron disease, unspecified: Secondary | ICD-10-CM | POA: Diagnosis not present

## 2023-02-28 DIAGNOSIS — I129 Hypertensive chronic kidney disease with stage 1 through stage 4 chronic kidney disease, or unspecified chronic kidney disease: Secondary | ICD-10-CM | POA: Diagnosis not present

## 2023-02-28 DIAGNOSIS — G43909 Migraine, unspecified, not intractable, without status migrainosus: Secondary | ICD-10-CM | POA: Diagnosis not present

## 2023-02-28 DIAGNOSIS — N1831 Chronic kidney disease, stage 3a: Secondary | ICD-10-CM | POA: Diagnosis not present

## 2023-02-28 DIAGNOSIS — Z431 Encounter for attention to gastrostomy: Secondary | ICD-10-CM | POA: Diagnosis not present

## 2023-02-28 DIAGNOSIS — E559 Vitamin D deficiency, unspecified: Secondary | ICD-10-CM | POA: Diagnosis not present

## 2023-03-01 DIAGNOSIS — N1831 Chronic kidney disease, stage 3a: Secondary | ICD-10-CM | POA: Diagnosis not present

## 2023-03-01 DIAGNOSIS — G43909 Migraine, unspecified, not intractable, without status migrainosus: Secondary | ICD-10-CM | POA: Diagnosis not present

## 2023-03-01 DIAGNOSIS — I129 Hypertensive chronic kidney disease with stage 1 through stage 4 chronic kidney disease, or unspecified chronic kidney disease: Secondary | ICD-10-CM | POA: Diagnosis not present

## 2023-03-01 DIAGNOSIS — Z431 Encounter for attention to gastrostomy: Secondary | ICD-10-CM | POA: Diagnosis not present

## 2023-03-01 DIAGNOSIS — G1220 Motor neuron disease, unspecified: Secondary | ICD-10-CM | POA: Diagnosis not present

## 2023-03-01 DIAGNOSIS — E559 Vitamin D deficiency, unspecified: Secondary | ICD-10-CM | POA: Diagnosis not present

## 2023-03-07 DIAGNOSIS — Z853 Personal history of malignant neoplasm of breast: Secondary | ICD-10-CM | POA: Diagnosis not present

## 2023-03-07 DIAGNOSIS — N1831 Chronic kidney disease, stage 3a: Secondary | ICD-10-CM | POA: Diagnosis not present

## 2023-03-07 DIAGNOSIS — Z993 Dependence on wheelchair: Secondary | ICD-10-CM | POA: Diagnosis not present

## 2023-03-07 DIAGNOSIS — Z7902 Long term (current) use of antithrombotics/antiplatelets: Secondary | ICD-10-CM | POA: Diagnosis not present

## 2023-03-07 DIAGNOSIS — J849 Interstitial pulmonary disease, unspecified: Secondary | ICD-10-CM | POA: Diagnosis not present

## 2023-03-07 DIAGNOSIS — R1310 Dysphagia, unspecified: Secondary | ICD-10-CM | POA: Diagnosis not present

## 2023-03-07 DIAGNOSIS — M21372 Foot drop, left foot: Secondary | ICD-10-CM | POA: Diagnosis not present

## 2023-03-07 DIAGNOSIS — Z431 Encounter for attention to gastrostomy: Secondary | ICD-10-CM | POA: Diagnosis not present

## 2023-03-07 DIAGNOSIS — K8681 Exocrine pancreatic insufficiency: Secondary | ICD-10-CM | POA: Diagnosis not present

## 2023-03-07 DIAGNOSIS — K219 Gastro-esophageal reflux disease without esophagitis: Secondary | ICD-10-CM | POA: Diagnosis not present

## 2023-03-07 DIAGNOSIS — Z8673 Personal history of transient ischemic attack (TIA), and cerebral infarction without residual deficits: Secondary | ICD-10-CM | POA: Diagnosis not present

## 2023-03-07 DIAGNOSIS — M85851 Other specified disorders of bone density and structure, right thigh: Secondary | ICD-10-CM | POA: Diagnosis not present

## 2023-03-07 DIAGNOSIS — I700 Atherosclerosis of aorta: Secondary | ICD-10-CM | POA: Diagnosis not present

## 2023-03-07 DIAGNOSIS — I129 Hypertensive chronic kidney disease with stage 1 through stage 4 chronic kidney disease, or unspecified chronic kidney disease: Secondary | ICD-10-CM | POA: Diagnosis not present

## 2023-03-07 DIAGNOSIS — Z9181 History of falling: Secondary | ICD-10-CM | POA: Diagnosis not present

## 2023-03-07 DIAGNOSIS — E7800 Pure hypercholesterolemia, unspecified: Secondary | ICD-10-CM | POA: Diagnosis not present

## 2023-03-07 DIAGNOSIS — G1220 Motor neuron disease, unspecified: Secondary | ICD-10-CM | POA: Diagnosis not present

## 2023-03-07 DIAGNOSIS — M50322 Other cervical disc degeneration at C5-C6 level: Secondary | ICD-10-CM | POA: Diagnosis not present

## 2023-03-07 DIAGNOSIS — J439 Emphysema, unspecified: Secondary | ICD-10-CM | POA: Diagnosis not present

## 2023-03-07 DIAGNOSIS — J309 Allergic rhinitis, unspecified: Secondary | ICD-10-CM | POA: Diagnosis not present

## 2023-03-07 DIAGNOSIS — E559 Vitamin D deficiency, unspecified: Secondary | ICD-10-CM | POA: Diagnosis not present

## 2023-03-07 DIAGNOSIS — G43909 Migraine, unspecified, not intractable, without status migrainosus: Secondary | ICD-10-CM | POA: Diagnosis not present

## 2023-03-12 DIAGNOSIS — I129 Hypertensive chronic kidney disease with stage 1 through stage 4 chronic kidney disease, or unspecified chronic kidney disease: Secondary | ICD-10-CM | POA: Diagnosis not present

## 2023-03-12 DIAGNOSIS — G1220 Motor neuron disease, unspecified: Secondary | ICD-10-CM | POA: Diagnosis not present

## 2023-03-12 DIAGNOSIS — E559 Vitamin D deficiency, unspecified: Secondary | ICD-10-CM | POA: Diagnosis not present

## 2023-03-12 DIAGNOSIS — N1831 Chronic kidney disease, stage 3a: Secondary | ICD-10-CM | POA: Diagnosis not present

## 2023-03-12 DIAGNOSIS — Z431 Encounter for attention to gastrostomy: Secondary | ICD-10-CM | POA: Diagnosis not present

## 2023-03-12 DIAGNOSIS — G43909 Migraine, unspecified, not intractable, without status migrainosus: Secondary | ICD-10-CM | POA: Diagnosis not present

## 2023-03-19 DIAGNOSIS — Z431 Encounter for attention to gastrostomy: Secondary | ICD-10-CM | POA: Diagnosis not present

## 2023-03-19 DIAGNOSIS — G1220 Motor neuron disease, unspecified: Secondary | ICD-10-CM | POA: Diagnosis not present

## 2023-03-19 DIAGNOSIS — E559 Vitamin D deficiency, unspecified: Secondary | ICD-10-CM | POA: Diagnosis not present

## 2023-03-19 DIAGNOSIS — N1831 Chronic kidney disease, stage 3a: Secondary | ICD-10-CM | POA: Diagnosis not present

## 2023-03-19 DIAGNOSIS — G43909 Migraine, unspecified, not intractable, without status migrainosus: Secondary | ICD-10-CM | POA: Diagnosis not present

## 2023-03-19 DIAGNOSIS — I129 Hypertensive chronic kidney disease with stage 1 through stage 4 chronic kidney disease, or unspecified chronic kidney disease: Secondary | ICD-10-CM | POA: Diagnosis not present

## 2023-03-27 ENCOUNTER — Ambulatory Visit (INDEPENDENT_AMBULATORY_CARE_PROVIDER_SITE_OTHER): Payer: Medicare Other | Admitting: Podiatry

## 2023-03-27 ENCOUNTER — Encounter: Payer: Self-pay | Admitting: Podiatry

## 2023-03-27 DIAGNOSIS — R600 Localized edema: Secondary | ICD-10-CM

## 2023-03-27 DIAGNOSIS — M779 Enthesopathy, unspecified: Secondary | ICD-10-CM | POA: Diagnosis not present

## 2023-03-27 NOTE — Progress Notes (Signed)
Subjective:   Patient ID: Yvonne Jordan, female   DOB: 77 y.o.   MRN: [id number]   HPI Patient presents stating that she had an accident with a wheelchair and developed swelling in her feet and was just concerned because she has ALS   ROS      Objective:  Physical Exam  Neurovascular status moderately diminished diminished muscle strength due to ALS with patient found to have low-grade swelling in her lower legs and feet but a lot of this probably due to being in her wheelchair and dependent     Assessment:  Trauma to both feet with what appears to be the probability for injury of a low-grade nature no indication most likely a fracture as the pain is mild     Plan:  H&P reviewed with her which took time because of her speech impediment and at this point I have just recommended elevation to be done and if any other issues were to occur to let us know that it should heal uneventfully and I do not see any more aggressive treatment that would be of benefit right now to this patient

## 2023-03-29 DIAGNOSIS — N1831 Chronic kidney disease, stage 3a: Secondary | ICD-10-CM | POA: Diagnosis not present

## 2023-03-29 DIAGNOSIS — I129 Hypertensive chronic kidney disease with stage 1 through stage 4 chronic kidney disease, or unspecified chronic kidney disease: Secondary | ICD-10-CM | POA: Diagnosis not present

## 2023-03-29 DIAGNOSIS — Z431 Encounter for attention to gastrostomy: Secondary | ICD-10-CM | POA: Diagnosis not present

## 2023-03-29 DIAGNOSIS — E559 Vitamin D deficiency, unspecified: Secondary | ICD-10-CM | POA: Diagnosis not present

## 2023-03-29 DIAGNOSIS — G43909 Migraine, unspecified, not intractable, without status migrainosus: Secondary | ICD-10-CM | POA: Diagnosis not present

## 2023-03-29 DIAGNOSIS — G1220 Motor neuron disease, unspecified: Secondary | ICD-10-CM | POA: Diagnosis not present

## 2023-04-03 DIAGNOSIS — N1831 Chronic kidney disease, stage 3a: Secondary | ICD-10-CM | POA: Diagnosis not present

## 2023-04-03 DIAGNOSIS — G43909 Migraine, unspecified, not intractable, without status migrainosus: Secondary | ICD-10-CM | POA: Diagnosis not present

## 2023-04-03 DIAGNOSIS — I129 Hypertensive chronic kidney disease with stage 1 through stage 4 chronic kidney disease, or unspecified chronic kidney disease: Secondary | ICD-10-CM | POA: Diagnosis not present

## 2023-04-03 DIAGNOSIS — G1220 Motor neuron disease, unspecified: Secondary | ICD-10-CM | POA: Diagnosis not present

## 2023-04-03 DIAGNOSIS — Z431 Encounter for attention to gastrostomy: Secondary | ICD-10-CM | POA: Diagnosis not present

## 2023-04-03 DIAGNOSIS — E559 Vitamin D deficiency, unspecified: Secondary | ICD-10-CM | POA: Diagnosis not present

## 2023-04-04 DIAGNOSIS — N1831 Chronic kidney disease, stage 3a: Secondary | ICD-10-CM | POA: Diagnosis not present

## 2023-04-04 DIAGNOSIS — E559 Vitamin D deficiency, unspecified: Secondary | ICD-10-CM | POA: Diagnosis not present

## 2023-04-04 DIAGNOSIS — I129 Hypertensive chronic kidney disease with stage 1 through stage 4 chronic kidney disease, or unspecified chronic kidney disease: Secondary | ICD-10-CM | POA: Diagnosis not present

## 2023-04-04 DIAGNOSIS — G43909 Migraine, unspecified, not intractable, without status migrainosus: Secondary | ICD-10-CM | POA: Diagnosis not present

## 2023-04-04 DIAGNOSIS — Z431 Encounter for attention to gastrostomy: Secondary | ICD-10-CM | POA: Diagnosis not present

## 2023-04-04 DIAGNOSIS — G1220 Motor neuron disease, unspecified: Secondary | ICD-10-CM | POA: Diagnosis not present

## 2023-04-06 DIAGNOSIS — G1220 Motor neuron disease, unspecified: Secondary | ICD-10-CM | POA: Diagnosis not present

## 2023-04-06 DIAGNOSIS — M50322 Other cervical disc degeneration at C5-C6 level: Secondary | ICD-10-CM | POA: Diagnosis not present

## 2023-04-06 DIAGNOSIS — Z9181 History of falling: Secondary | ICD-10-CM | POA: Diagnosis not present

## 2023-04-06 DIAGNOSIS — Z431 Encounter for attention to gastrostomy: Secondary | ICD-10-CM | POA: Diagnosis not present

## 2023-04-06 DIAGNOSIS — K8681 Exocrine pancreatic insufficiency: Secondary | ICD-10-CM | POA: Diagnosis not present

## 2023-04-06 DIAGNOSIS — J309 Allergic rhinitis, unspecified: Secondary | ICD-10-CM | POA: Diagnosis not present

## 2023-04-06 DIAGNOSIS — J439 Emphysema, unspecified: Secondary | ICD-10-CM | POA: Diagnosis not present

## 2023-04-06 DIAGNOSIS — E559 Vitamin D deficiency, unspecified: Secondary | ICD-10-CM | POA: Diagnosis not present

## 2023-04-06 DIAGNOSIS — Z8673 Personal history of transient ischemic attack (TIA), and cerebral infarction without residual deficits: Secondary | ICD-10-CM | POA: Diagnosis not present

## 2023-04-06 DIAGNOSIS — N1831 Chronic kidney disease, stage 3a: Secondary | ICD-10-CM | POA: Diagnosis not present

## 2023-04-06 DIAGNOSIS — Z7902 Long term (current) use of antithrombotics/antiplatelets: Secondary | ICD-10-CM | POA: Diagnosis not present

## 2023-04-06 DIAGNOSIS — Z79899 Other long term (current) drug therapy: Secondary | ICD-10-CM | POA: Diagnosis not present

## 2023-04-06 DIAGNOSIS — I700 Atherosclerosis of aorta: Secondary | ICD-10-CM | POA: Diagnosis not present

## 2023-04-06 DIAGNOSIS — M21372 Foot drop, left foot: Secondary | ICD-10-CM | POA: Diagnosis not present

## 2023-04-06 DIAGNOSIS — E7800 Pure hypercholesterolemia, unspecified: Secondary | ICD-10-CM | POA: Diagnosis not present

## 2023-04-06 DIAGNOSIS — M85851 Other specified disorders of bone density and structure, right thigh: Secondary | ICD-10-CM | POA: Diagnosis not present

## 2023-04-06 DIAGNOSIS — R1310 Dysphagia, unspecified: Secondary | ICD-10-CM | POA: Diagnosis not present

## 2023-04-06 DIAGNOSIS — J849 Interstitial pulmonary disease, unspecified: Secondary | ICD-10-CM | POA: Diagnosis not present

## 2023-04-06 DIAGNOSIS — Z853 Personal history of malignant neoplasm of breast: Secondary | ICD-10-CM | POA: Diagnosis not present

## 2023-04-06 DIAGNOSIS — I129 Hypertensive chronic kidney disease with stage 1 through stage 4 chronic kidney disease, or unspecified chronic kidney disease: Secondary | ICD-10-CM | POA: Diagnosis not present

## 2023-04-06 DIAGNOSIS — K219 Gastro-esophageal reflux disease without esophagitis: Secondary | ICD-10-CM | POA: Diagnosis not present

## 2023-04-06 DIAGNOSIS — G43909 Migraine, unspecified, not intractable, without status migrainosus: Secondary | ICD-10-CM | POA: Diagnosis not present

## 2023-04-10 ENCOUNTER — Emergency Department (HOSPITAL_COMMUNITY): Payer: Medicare Other

## 2023-04-10 ENCOUNTER — Encounter (HOSPITAL_COMMUNITY): Payer: Self-pay

## 2023-04-10 ENCOUNTER — Emergency Department (HOSPITAL_COMMUNITY)
Admission: EM | Admit: 2023-04-10 | Discharge: 2023-04-10 | Disposition: A | Discharge status: Home / Self Care | Payer: Medicare Other | Attending: Student | Admitting: Student

## 2023-04-10 ENCOUNTER — Other Ambulatory Visit: Payer: Self-pay

## 2023-04-10 ENCOUNTER — Other Ambulatory Visit: Payer: Medicare Other

## 2023-04-10 VITALS — BP 130/72 | HR 92 | Temp 97.4°F

## 2023-04-10 DIAGNOSIS — N183 Chronic kidney disease, stage 3 unspecified: Secondary | ICD-10-CM | POA: Diagnosis not present

## 2023-04-10 DIAGNOSIS — Z043 Encounter for examination and observation following other accident: Secondary | ICD-10-CM | POA: Diagnosis not present

## 2023-04-10 DIAGNOSIS — W01198A Fall on same level from slipping, tripping and stumbling with subsequent striking against other object, initial encounter: Secondary | ICD-10-CM | POA: Insufficient documentation

## 2023-04-10 DIAGNOSIS — S01112A Laceration without foreign body of left eyelid and periocular area, initial encounter: Secondary | ICD-10-CM | POA: Insufficient documentation

## 2023-04-10 DIAGNOSIS — Z8673 Personal history of transient ischemic attack (TIA), and cerebral infarction without residual deficits: Secondary | ICD-10-CM | POA: Insufficient documentation

## 2023-04-10 DIAGNOSIS — W19XXXA Unspecified fall, initial encounter: Secondary | ICD-10-CM | POA: Diagnosis not present

## 2023-04-10 DIAGNOSIS — Z993 Dependence on wheelchair: Secondary | ICD-10-CM | POA: Insufficient documentation

## 2023-04-10 DIAGNOSIS — N1831 Chronic kidney disease, stage 3a: Secondary | ICD-10-CM | POA: Diagnosis not present

## 2023-04-10 DIAGNOSIS — S0181XA Laceration without foreign body of other part of head, initial encounter: Secondary | ICD-10-CM

## 2023-04-10 DIAGNOSIS — Z7901 Long term (current) use of anticoagulants: Secondary | ICD-10-CM | POA: Insufficient documentation

## 2023-04-10 DIAGNOSIS — Z515 Encounter for palliative care: Secondary | ICD-10-CM

## 2023-04-10 DIAGNOSIS — Z7902 Long term (current) use of antithrombotics/antiplatelets: Secondary | ICD-10-CM | POA: Insufficient documentation

## 2023-04-10 DIAGNOSIS — M4312 Spondylolisthesis, cervical region: Secondary | ICD-10-CM | POA: Diagnosis not present

## 2023-04-10 DIAGNOSIS — Z853 Personal history of malignant neoplasm of breast: Secondary | ICD-10-CM | POA: Insufficient documentation

## 2023-04-10 DIAGNOSIS — S0990XA Unspecified injury of head, initial encounter: Secondary | ICD-10-CM | POA: Diagnosis not present

## 2023-04-10 DIAGNOSIS — E559 Vitamin D deficiency, unspecified: Secondary | ICD-10-CM | POA: Diagnosis not present

## 2023-04-10 DIAGNOSIS — I129 Hypertensive chronic kidney disease with stage 1 through stage 4 chronic kidney disease, or unspecified chronic kidney disease: Secondary | ICD-10-CM | POA: Diagnosis not present

## 2023-04-10 DIAGNOSIS — G1220 Motor neuron disease, unspecified: Secondary | ICD-10-CM | POA: Diagnosis not present

## 2023-04-10 DIAGNOSIS — M50322 Other cervical disc degeneration at C5-C6 level: Secondary | ICD-10-CM | POA: Diagnosis not present

## 2023-04-10 DIAGNOSIS — G43909 Migraine, unspecified, not intractable, without status migrainosus: Secondary | ICD-10-CM | POA: Diagnosis not present

## 2023-04-10 LAB — CBC WITH DIFFERENTIAL/PLATELET
Abs Immature Granulocytes: 0.01 10*3/uL (ref 0.00–0.07)
Basophils Absolute: 0.1 10*3/uL (ref 0.0–0.1)
Basophils Relative: 1 %
Eosinophils Absolute: 0 10*3/uL (ref 0.0–0.5)
Eosinophils Relative: 0 %
HCT: 38.2 % (ref 36.0–46.0)
Hemoglobin: 12.5 g/dL (ref 12.0–15.0)
Immature Granulocytes: 0 %
Lymphocytes Relative: 26 %
Lymphs Abs: 1.5 10*3/uL (ref 0.7–4.0)
MCH: 25.6 pg — ABNORMAL LOW (ref 26.0–34.0)
MCHC: 32.7 g/dL (ref 30.0–36.0)
MCV: 78.3 fL — ABNORMAL LOW (ref 80.0–100.0)
Monocytes Absolute: 0.4 10*3/uL (ref 0.1–1.0)
Monocytes Relative: 7 %
Neutro Abs: 3.9 10*3/uL (ref 1.7–7.7)
Neutrophils Relative %: 66 %
Platelets: 285 10*3/uL (ref 150–400)
RBC: 4.88 MIL/uL (ref 3.87–5.11)
RDW: 15.4 % (ref 11.5–15.5)
WBC: 5.9 10*3/uL (ref 4.0–10.5)
nRBC: 0 % (ref 0.0–0.2)

## 2023-04-10 LAB — COMPREHENSIVE METABOLIC PANEL
ALT: 19 U/L (ref 0–44)
AST: 20 U/L (ref 15–41)
Albumin: 4.1 g/dL (ref 3.5–5.0)
Alkaline Phosphatase: 60 U/L (ref 38–126)
Anion gap: 9 (ref 5–15)
BUN: 10 mg/dL (ref 8–23)
CO2: 23 mmol/L (ref 22–32)
Calcium: 9.6 mg/dL (ref 8.9–10.3)
Chloride: 105 mmol/L (ref 98–111)
Creatinine, Ser: 0.89 mg/dL (ref 0.44–1.00)
GFR, Estimated: >60 mL/min (ref >60)
Glucose, Bld: 107 mg/dL — ABNORMAL HIGH (ref 70–99)
Potassium: 3.7 mmol/L (ref 3.5–5.1)
Sodium: 137 mmol/L (ref 135–145)
Total Bilirubin: 0.7 mg/dL (ref 0.3–1.2)
Total Protein: 7 g/dL (ref 6.5–8.1)

## 2023-04-10 MED ORDER — LIDOCAINE-EPINEPHRINE-TETRACAINE (LET) TOPICAL GEL
3.0000 mL | Freq: Once | TOPICAL | Status: AC
  Administered 2023-04-10: 3 mL via TOPICAL
  Filled 2023-04-10: qty 3

## 2023-04-10 NOTE — ED Notes (Signed)
Patient transported to CT 

## 2023-04-10 NOTE — Progress Notes (Signed)
Orthopedic Tech Progress Note Patient Details:  Yvonne Jordan 02-04-1946 [id number]  Level 2 trauma   Patient ID: Yvonne Jordan, female   DOB: [birth year], 77 y.o.   MRN: [id number]  [name] 04/10/2023, 6:24 PM

## 2023-04-10 NOTE — Progress Notes (Signed)
PATIENT NAME: Yvonne Jordan DOB: Sep 13, 1946 MRN: [id number]  PRIMARY CARE PROVIDER: Tally Joe, MD  RESPONSIBLE PARTY:  Acct ID - Guarantor Home Phone Work Phone Relationship Acct Type  [id number] Roseanna Rainbow* [phone] Self P/F     [address], [place], [place] [address]   Palliative Care Initial Encounter Note  Completed home visit. Pt's husband is now deceased. Neighbor is present during visit     HISTORY OF PRESENT ILLNESS:     Respiratory: no SOB at this time:occasional SOB; uses a CPAP machine and BiWaze cough machine   Cognitive: alert and oriented but difficult to understand most times   Appetite: has feeding tube but eats PO at this time; drinks thin liquids and coughs occasionally; regular diet and eats 3 full meals; trying to gain weight   GI/GU: BM every 1-2 days; denies urinary issues; wears an adult pull up   Mobility: uses an electric wheel chair at this time   ADLs: independent with grooming and dressing at this time; able to put food in microwave but needs some one to clean and fix full meals   Sleeping Pattern: still doesn't sleep through the night but can go back to sleep   Pain: denies pain at this time      Goals of Care: Currently looking for an Assisted Living facility; prepping her house for sale  Today's Visit:    CODE STATUS: Full Code  ADVANCED DIRECTIVES: N MOST FORM: N PPS: 40%   PHYSICAL EXAM:   VITALS: Today's Vitals   04/10/23 1210  BP: 130/72  Pulse: 92  Temp: (!) 97.4 F (36.3 C)  TempSrc: Temporal  SpO2: 96%  PainSc: 0-No pain    LUNGS: clear to auscultation  CARDIAC: Cor RRR EXTREMITIES: AROM x4 SKIN: Skin color, texture, turgor normal. No rashes or lesions  NEURO: negative except for coordination problems, gait problems, and speech problems   Pt is aware that she will see another nurse on her next visit and she is aware that when she goes into a facility, she will see a  NP.    Kemauri Musa Clementeen Graham, LPN

## 2023-04-10 NOTE — Progress Notes (Addendum)
Chaplain responded to Trauma page, pt was alert and willing to visit. Pt was tearful as she talked about her late husband, who passed away this 16-Apr-2023 and how she may have to move away from their home. She said her faith has helped her through the challenges. I assisted bringing 3 family members to her room, provided prayer, reflective listening and a calm presence.  [address] [place]   04/10/23 1800  Spiritual Encounters  Type of Visit Initial  Care provided to: Patient;Family  Conversation partners present during encounter Nurse  Referral source Trauma page  Reason for visit Trauma  OnCall Visit Yes  Spiritual Framework  Presenting Themes Significant life change;Impactful experiences and emotions  Community/Connection Family;Faith community  Patient Stress Factors Health changes;Loss  Interventions  Spiritual Care Interventions Made Established relationship of care and support;Compassionate presence;Reflective listening;Normalization of emotions;Bereavement/grief support;Prayer  Intervention Outcomes  Outcomes Reduced anxiety;Connection to spiritual care

## 2023-04-10 NOTE — ED Provider Notes (Signed)
[place] EMERGENCY DEPARTMENT AT [facility] Provider Note  CSN: [id number] Arrival date & time: 04/10/23 1800  Chief Complaint(s) Fall On Thinners  HPI Yvonne Jordan is a 77 y.o. 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  History obtained from EMS who states that the patient is primarily in a wheelchair but is able to stand.  She attempted to stand to grab something out of a marker wave and fell forward striking her face on the ground.  No loss of consciousness.  Denies chest pain, abdominal pain, pain in the upper extremities.  States that she has chronic lower extremity pain and it is not worse today than normal.   Past Medical History Past Medical History:  Diagnosis Date   Allergic rhinitis    ALS (amyotrophic lateral sclerosis)    Arthritis    Cancer    Breast- left   CKD (chronic kidney disease)    stage III   Colon polyp    Dyspnea    GERD (gastroesophageal reflux disease)    History of hiatal hernia    Hyperlipidemia    Personal history of radiation therapy    Stroke    TIA before 2013   TIA (transient ischemic attack)    Vitamin D deficiency    Patient Active Problem List   Diagnosis Date Noted   Motor neuron disease 05/30/2022   Weakness 05/08/2022   Left foot drop 05/08/2022   Dysarthria 05/08/2022   Gait abnormality 05/08/2022   S/P repair of paraesophageal hernia 09/01/2020   ILD (interstitial lung disease) 07/21/2020   Pain in right knee 12/30/2018   Trochanteric bursitis of right hip 12/30/2018   Low back pain 04/19/2016   Abnormality of gait 03/29/2016   Fall 03/29/2016   Dyspnea 11/10/2014   GERD (gastroesophageal reflux disease) 11/10/2014   TIA (transient ischemic attack)    Vitamin D deficiency    Allergic rhinitis    CKD (chronic kidney disease)    Colon polyp    Hyperlipidemia    Home Medication(s) Prior to Admission medications   Medication  Sig Start Date End Date Taking? Authorizing Provider  acetaminophen (TYLENOL) 500 MG tablet Take 1,000 mg by mouth every 6 (six) hours as needed for moderate pain.    [provider]  atorvastatin (LIPITOR) 20 MG tablet Take 20 mg by mouth in the morning. 01/02/19   [provider]  B Complex-C (SUPER B COMPLEX PO) Take 1 tablet by mouth daily.    [provider]  clopidogrel (PLAVIX) 75 MG tablet Take 75 mg by mouth daily with breakfast.    [provider]  Cyanocobalamin (B-12) 5000 MCG CAPS Take 5,000 mcg by mouth daily.    [provider]  lipase/protease/amylase (CREON) 36000 UNITS CPEP capsule Take 36,000 Units by mouth See admin instructions. Take 08657 units before  each meal and before each snack    [provider]  riluzole (RILUTEK) 50 MG tablet Take 50 mg by mouth every 12 (twelve) hours.    [provider]  Past Surgical History Past Surgical History:  Procedure Laterality Date   APPENDECTOMY     BREAST LUMPECTOMY Left 2006   BREAST SURGERY     2006...treated with Tamoxifen for 6 years   CHOLECYSTECTOMY     COLONOSCOPY     ESOPHAGOGASTRODUODENOSCOPY N/A 09/01/2020   Procedure: ESOPHAGOGASTRODUODENOSCOPY (EGD);  Surgeon: Corliss Skains, MD;  Location: [facility] OR;  Service: Thoracic;  Laterality: N/A;   IR GASTROSTOMY TUBE MOD SED  01/11/2023   RADIOLOGY WITH ANESTHESIA N/A 01/11/2023   Procedure: IR WITH ANESTHESIA INTERVENTIONAL PEG PLACEMENT;  Surgeon: Radiologist, Medication, MD;  Location: [facility] OR;  Service: Radiology;  Laterality: N/A;   XI ROBOTIC ASSISTED PARAESOPHAGEAL HERNIA REPAIR N/A 09/01/2020   Procedure: XI ROBOTIC ASSISTED HIATAL HERNIA REPAIR WITH FUNDOPLICATION AND GASTROPEXY;  Surgeon: Corliss Skains, MD;  Location: [facility] OR;  Service: Thoracic;  Laterality: N/A;   Family  History Family History  Problem Relation Age of Onset   Breast cancer Sister    Heart attack Sister    Heart disease Father    Stroke Father    Heart disease Maternal Uncle    Heart disease Maternal Uncle    Heart disease Maternal Uncle    Stroke Mother    Deep vein thrombosis Mother     Social History Social History   Tobacco Use   Smoking status: Never   Smokeless tobacco: Never  Vaping Use   Vaping Use: Never used  Substance Use Topics   Alcohol use: Not Currently    Comment: 1 drink -every few weeks   Drug use: No   Allergies Relyvrio [phenylbutyrate-taurursodiol]  Review of Systems Review of Systems  Skin:  Positive for wound.    Physical Exam Vital Signs  I have reviewed the triage vital signs BP 129/79   Pulse 97   Temp 99.2 F (37.3 C) (Oral)   Resp 16   Ht  (1.651 m)   Wt 52.2 kg   SpO2 98%   BMI 19.14 kg/m   Physical Exam Vitals and nursing note reviewed.  Constitutional:      General: She is not in acute distress.    Appearance: She is well-developed.  HENT:     Head: Normocephalic.     Comments: L lateral orbit lac Eyes:     Conjunctiva/sclera: Conjunctivae normal.  Cardiovascular:     Rate and Rhythm: Normal rate and regular rhythm.     Heart sounds: No murmur heard. Pulmonary:     Effort: Pulmonary effort is normal. No respiratory distress.     Breath sounds: Normal breath sounds.  Abdominal:     Palpations: Abdomen is soft.     Tenderness: There is no abdominal tenderness.  Musculoskeletal:        General: No swelling.     Cervical back: Neck supple.  Skin:    General: Skin is warm and dry.     Capillary Refill: Capillary refill takes less than 2 seconds.  Neurological:     Mental Status: She is alert.  Psychiatric:        Mood and Affect: Mood normal.     ED Results and Treatments Labs (all labs ordered are listed, but only abnormal results are displayed) Labs Reviewed  CBC WITH DIFFERENTIAL/PLATELET - Abnormal;  Notable for the following components:      Result Value   MCV 78.3 (*)    MCH 25.6 (*)    All other components within normal limits  COMPREHENSIVE METABOLIC PANEL -  Abnormal; Notable for the following components:   Glucose, Bld 107 (*)    All other components within normal limits                                                                                                                          Radiology CT Cervical Spine Wo Contrast  Result Date: 04/10/2023 CLINICAL DATA:  Neck trauma (Age >= 65y) EXAM: CT CERVICAL SPINE WITHOUT CONTRAST TECHNIQUE: Multidetector CT imaging of the cervical spine was performed without intravenous contrast. Multiplanar CT image reconstructions were also generated. RADIATION DOSE REDUCTION: This exam was performed according to the departmental dose-optimization program which includes automated exposure control, adjustment of the mA and/or kV according to patient size and/or use of iterative reconstruction technique. COMPARISON:  03/11/2022 FINDINGS: Alignment: Stable trace grade 1 anterolisthesis of C4 on C5. No traumatic malalignment. Skull base and vertebrae: No acute fracture. Vertebral body heights are maintained. The dens and skull base are intact. Soft tissues and spinal canal: No prevertebral fluid or swelling. No visible canal hematoma. Disc levels: Similar diffuse degenerative disc disease and facet hypertrophy. Upper chest: No acute or unexpected findings. Other: None. IMPRESSION: 1. No acute fracture or traumatic malalignment of the cervical spine. 2. Similar diffuse degenerative disc disease and facet hypertrophy. Electronically Signed   By: Narda Rutherford M.D.   On: 04/10/2023 18:58   CT HEAD WO CONTRAST ( )  Result Date: 04/10/2023 CLINICAL DATA:  Head trauma, minor (Age >= 65y) EXAM: CT HEAD WITHOUT CONTRAST TECHNIQUE: Contiguous axial images were obtained from the base of the skull through the vertex without intravenous contrast. RADIATION DOSE  REDUCTION: This exam was performed according to the departmental dose-optimization program which includes automated exposure control, adjustment of the mA and/or kV according to patient size and/or use of iterative reconstruction technique. COMPARISON:  Head CT 03/19/2022 FINDINGS: Brain: No intracranial hemorrhage, mass effect, or midline shift. Similar atrophy and chronic small vessel ischemia to prior. No hydrocephalus. The basilar cisterns are patent. No evidence of territorial infarct or acute ischemia. No extra-axial or intracranial fluid collection. Vascular: No hyperdense vessel or unexpected calcification. Skull: No fracture or focal lesion. Sinuses/Orbits: Minimal left supraorbital soft tissue thickening. No facial bone fracture. Bilateral cataract resection. Other: None. IMPRESSION: 1. No acute intracranial abnormality. No skull fracture. 2. Unchanged atrophy and chronic small vessel ischemia. Electronically Signed   By: Narda Rutherford M.D.   On: 04/10/2023 18:54   DG Pelvis Portable  Result Date: 04/10/2023 CLINICAL DATA:  Fall, rule out fracture. EXAM: PORTABLE PELVIS 1-2 VIEWS COMPARISON:  Reformats from abdominopelvic CT 06/28/2022 FINDINGS: The cortical margins of the bony pelvis are intact. No fracture. Pubic symphysis and sacroiliac joints are congruent. Both femoral heads are well-seated in the respective acetabula. Mild bilateral hip degenerative change. Stable right sacral bone island. IMPRESSION: No pelvic fracture. Electronically Signed   By: Narda Rutherford M.D.   On: 04/10/2023 18:50   DG Chest Portable 1 View  Result  Date: 04/10/2023 CLINICAL DATA:  Fall. EXAM: PORTABLE CHEST 1 VIEW COMPARISON:  CT 06/28/2022 FINDINGS: The cardiomediastinal contours are normal. Right lung chain suture. Pulmonary vasculature is normal. No consolidation, pleural effusion, or pneumothorax. No acute osseous abnormalities are seen. No bones are subjectively under mineralized. Left axillary surgical  clips. IMPRESSION: No acute chest findings. Electronically Signed   By: Narda Rutherford M.D.   On: 04/10/2023 18:49    Pertinent labs & imaging results that were available during my care of the patient were reviewed by me and considered in my medical decision making (see MDM for details).  Medications Ordered in ED Medications  lidocaine-EPINEPHrine-tetracaine (LET) topical gel (3 mLs Topical Given 04/10/23 1922)                                                                                                                                     Procedures .Marland KitchenLaceration Repair  Date/Time: 04/10/2023 11:25 PM  Performed by: Glendora Score, MD Authorized by: Glendora Score, MD   Laceration details:    Location:  Face   Face location:  L eyebrow   Length (cm):  0.5 Treatment:    Area cleansed with:  Chlorhexidine   Amount of cleaning:  Standard Skin repair:    Repair method:  Sutures   Suture size:  5-0   Suture material:  Plain gut   Suture technique:  Simple interrupted   Number of sutures:  1 Approximation:    Approximation:  Close Repair type:    Repair type:  Simple Post-procedure details:    Dressing:  Open (no dressing) .Critical Care  Performed by: Glendora Score, MD Authorized by: Glendora Score, MD   Critical care provider statement:    Critical care time (minutes):  30   Critical care was necessary to treat or prevent imminent or life-threatening deterioration of the following conditions:  Trauma   Critical care was time spent personally by me on the following activities:  Development of treatment plan with patient or surrogate, discussions with consultants, evaluation of patient's response to treatment, examination of patient, ordering and review of laboratory studies, ordering and review of radiographic studies, ordering and performing treatments and interventions, pulse oximetry, re-evaluation of patient's condition and review of old charts   (including critical  care time)  Medical Decision Making / ED Course   This patient presents to the ED for concern of fall on blood thinners, this involves an extensive number of treatment options, and is a complaint that carries with it a high risk of complications and morbidity.  The differential diagnosis includes closed head injury, ICH, fracture, hematoma, laceration  MDM: Patient seen emergency room for evaluation of a fall on blood thinners.  Patient arrives as a level 2 trauma and primary survey unremarkable.  Secondary survey with a 0.5 cm laceration to the left eyebrow but is otherwise unremarkable.  CT head and C-spine without fracture or intracranial hemorrhage.  Laboratory evaluation unremarkable.  Chest and pelvis x-ray unremarkable.  Laceration repaired at bedside with 1 absorbable suture.  With negative trauma workup, patient does not meet inpatient criteria for admission she is safe for discharge with outpatient follow-up   Additional history obtained: -Additional history obtained from multiple family members -External records from outside source obtained and reviewed including: Chart review including previous notes, labs, imaging, consultation notes   Lab Tests: -I ordered, reviewed, and interpreted labs.   The pertinent results include:   Labs Reviewed  CBC WITH DIFFERENTIAL/PLATELET - Abnormal; Notable for the following components:      Result Value   MCV 78.3 (*)    MCH 25.6 (*)    All other components within normal limits  COMPREHENSIVE METABOLIC PANEL - Abnormal; Notable for the following components:   Glucose, Bld 107 (*)    All other components within normal limits      Imaging Studies ordered: I ordered imaging studies including CT head, C-spine, chest x-ray, pelvis x-ray I independently visualized and interpreted imaging. I agree with the radiologist interpretation   Medicines ordered and prescription drug management: Meds ordered this encounter  Medications    lidocaine-EPINEPHrine-tetracaine (LET) topical gel    -I have reviewed the patients home medicines and have made adjustments as needed  Critical interventions Trauma activation and evaluation    Cardiac Monitoring: The patient was maintained on a cardiac monitor.  I personally viewed and interpreted the cardiac monitored which showed an underlying rhythm of: NSR  Social Determinants of Health:  Factors impacting patients care include: none   Reevaluation: After the interventions noted above, I reevaluated the patient and found that they have :improved  Co morbidities that complicate the patient evaluation  Past Medical History:  Diagnosis Date   Allergic rhinitis    ALS (amyotrophic lateral sclerosis)    Arthritis    Cancer    Breast- left   CKD (chronic kidney disease)    stage III   Colon polyp    Dyspnea    GERD (gastroesophageal reflux disease)    History of hiatal hernia    Hyperlipidemia    Personal history of radiation therapy    Stroke    TIA before 2013   TIA (transient ischemic attack)    Vitamin D deficiency       Dispostion: I considered admission for this patient, but at this time she does not meet inpatient criteria for admission she is safe for discharge with outpatient follow-up     Final Clinical Impression(s) / ED Diagnoses Final diagnoses:  Fall, initial encounter  Facial laceration, initial encounter     @PCDICTATION @    Glendora Score, MD 04/10/23 2328

## 2023-04-10 NOTE — ED Triage Notes (Signed)
Pt BIB [facility] from home as Fall on thinners. She was standing from her w/c to reach into her microwave in the kitchen. She then had a mechanical fall & landed face first on the kitchen floor. There are 2 small lacerations to the Lt side of her face between her eye brow & hair line, no bleeding upon arrival. Speaking & ambulation difficulties at baseline, Hx of ALS. VS per EMS: 132/98, 114 bpm, 16 Resp, 98% on RA, CBG 108, no PIV, A/Ox4, GCS 15.

## 2023-04-10 NOTE — ED Notes (Signed)
X-ray at bedside

## 2023-04-15 ENCOUNTER — Telehealth: Payer: Self-pay

## 2023-04-15 DIAGNOSIS — G43909 Migraine, unspecified, not intractable, without status migrainosus: Secondary | ICD-10-CM | POA: Diagnosis not present

## 2023-04-15 DIAGNOSIS — N1831 Chronic kidney disease, stage 3a: Secondary | ICD-10-CM | POA: Diagnosis not present

## 2023-04-15 DIAGNOSIS — E559 Vitamin D deficiency, unspecified: Secondary | ICD-10-CM | POA: Diagnosis not present

## 2023-04-15 DIAGNOSIS — M50322 Other cervical disc degeneration at C5-C6 level: Secondary | ICD-10-CM | POA: Diagnosis not present

## 2023-04-15 DIAGNOSIS — G1220 Motor neuron disease, unspecified: Secondary | ICD-10-CM | POA: Diagnosis not present

## 2023-04-15 DIAGNOSIS — I129 Hypertensive chronic kidney disease with stage 1 through stage 4 chronic kidney disease, or unspecified chronic kidney disease: Secondary | ICD-10-CM | POA: Diagnosis not present

## 2023-04-15 NOTE — Telephone Encounter (Signed)
255 pm.  Phone call made to patient to schedule a follow up visit for May.  No answer. Message left requesting a call back.

## 2023-04-19 DIAGNOSIS — S0181XD Laceration without foreign body of other part of head, subsequent encounter: Secondary | ICD-10-CM | POA: Diagnosis not present

## 2023-04-19 DIAGNOSIS — M79671 Pain in right foot: Secondary | ICD-10-CM | POA: Diagnosis not present

## 2023-04-19 DIAGNOSIS — Z742 Need for assistance at home and no other household member able to render care: Secondary | ICD-10-CM | POA: Diagnosis not present

## 2023-04-23 DIAGNOSIS — N1831 Chronic kidney disease, stage 3a: Secondary | ICD-10-CM | POA: Diagnosis not present

## 2023-04-23 DIAGNOSIS — G1220 Motor neuron disease, unspecified: Secondary | ICD-10-CM | POA: Diagnosis not present

## 2023-04-23 DIAGNOSIS — G43909 Migraine, unspecified, not intractable, without status migrainosus: Secondary | ICD-10-CM | POA: Diagnosis not present

## 2023-04-23 DIAGNOSIS — M50322 Other cervical disc degeneration at C5-C6 level: Secondary | ICD-10-CM | POA: Diagnosis not present

## 2023-04-23 DIAGNOSIS — I129 Hypertensive chronic kidney disease with stage 1 through stage 4 chronic kidney disease, or unspecified chronic kidney disease: Secondary | ICD-10-CM | POA: Diagnosis not present

## 2023-04-23 DIAGNOSIS — E559 Vitamin D deficiency, unspecified: Secondary | ICD-10-CM | POA: Diagnosis not present

## 2023-04-25 ENCOUNTER — Other Ambulatory Visit: Payer: Self-pay | Admitting: Physician Assistant

## 2023-04-25 ENCOUNTER — Ambulatory Visit
Admission: RE | Admit: 2023-04-25 | Discharge: 2023-04-25 | Disposition: A | Discharge status: Home / Self Care | Payer: Medicare Other | Source: Ambulatory Visit | Attending: Physician Assistant | Admitting: Physician Assistant

## 2023-04-25 DIAGNOSIS — M79671 Pain in right foot: Secondary | ICD-10-CM | POA: Diagnosis not present

## 2023-04-25 DIAGNOSIS — S99921A Unspecified injury of right foot, initial encounter: Secondary | ICD-10-CM | POA: Diagnosis not present

## 2023-04-29 DIAGNOSIS — N1831 Chronic kidney disease, stage 3a: Secondary | ICD-10-CM | POA: Diagnosis not present

## 2023-04-29 DIAGNOSIS — J849 Interstitial pulmonary disease, unspecified: Secondary | ICD-10-CM | POA: Diagnosis not present

## 2023-04-29 DIAGNOSIS — K8681 Exocrine pancreatic insufficiency: Secondary | ICD-10-CM | POA: Diagnosis not present

## 2023-04-29 DIAGNOSIS — G1220 Motor neuron disease, unspecified: Secondary | ICD-10-CM | POA: Diagnosis not present

## 2023-04-29 DIAGNOSIS — Z8673 Personal history of transient ischemic attack (TIA), and cerebral infarction without residual deficits: Secondary | ICD-10-CM | POA: Diagnosis not present

## 2023-04-29 DIAGNOSIS — J439 Emphysema, unspecified: Secondary | ICD-10-CM | POA: Diagnosis not present

## 2023-04-29 DIAGNOSIS — Z634 Disappearance and death of family member: Secondary | ICD-10-CM | POA: Diagnosis not present

## 2023-04-29 DIAGNOSIS — Z111 Encounter for screening for respiratory tuberculosis: Secondary | ICD-10-CM | POA: Diagnosis not present

## 2023-04-29 DIAGNOSIS — I700 Atherosclerosis of aorta: Secondary | ICD-10-CM | POA: Diagnosis not present

## 2023-04-29 DIAGNOSIS — E7800 Pure hypercholesterolemia, unspecified: Secondary | ICD-10-CM | POA: Diagnosis not present

## 2023-05-06 DIAGNOSIS — K8681 Exocrine pancreatic insufficiency: Secondary | ICD-10-CM | POA: Diagnosis not present

## 2023-05-06 DIAGNOSIS — K219 Gastro-esophageal reflux disease without esophagitis: Secondary | ICD-10-CM | POA: Diagnosis not present

## 2023-05-06 DIAGNOSIS — G43909 Migraine, unspecified, not intractable, without status migrainosus: Secondary | ICD-10-CM | POA: Diagnosis not present

## 2023-05-06 DIAGNOSIS — E7800 Pure hypercholesterolemia, unspecified: Secondary | ICD-10-CM | POA: Diagnosis not present

## 2023-05-06 DIAGNOSIS — J309 Allergic rhinitis, unspecified: Secondary | ICD-10-CM | POA: Diagnosis not present

## 2023-05-06 DIAGNOSIS — R1310 Dysphagia, unspecified: Secondary | ICD-10-CM | POA: Diagnosis not present

## 2023-05-06 DIAGNOSIS — M21372 Foot drop, left foot: Secondary | ICD-10-CM | POA: Diagnosis not present

## 2023-05-06 DIAGNOSIS — M85851 Other specified disorders of bone density and structure, right thigh: Secondary | ICD-10-CM | POA: Diagnosis not present

## 2023-05-06 DIAGNOSIS — J849 Interstitial pulmonary disease, unspecified: Secondary | ICD-10-CM | POA: Diagnosis not present

## 2023-05-06 DIAGNOSIS — G1220 Motor neuron disease, unspecified: Secondary | ICD-10-CM | POA: Diagnosis not present

## 2023-05-06 DIAGNOSIS — Z853 Personal history of malignant neoplasm of breast: Secondary | ICD-10-CM | POA: Diagnosis not present

## 2023-05-06 DIAGNOSIS — J439 Emphysema, unspecified: Secondary | ICD-10-CM | POA: Diagnosis not present

## 2023-05-06 DIAGNOSIS — E559 Vitamin D deficiency, unspecified: Secondary | ICD-10-CM | POA: Diagnosis not present

## 2023-05-06 DIAGNOSIS — I129 Hypertensive chronic kidney disease with stage 1 through stage 4 chronic kidney disease, or unspecified chronic kidney disease: Secondary | ICD-10-CM | POA: Diagnosis not present

## 2023-05-06 DIAGNOSIS — Z7902 Long term (current) use of antithrombotics/antiplatelets: Secondary | ICD-10-CM | POA: Diagnosis not present

## 2023-05-06 DIAGNOSIS — M50322 Other cervical disc degeneration at C5-C6 level: Secondary | ICD-10-CM | POA: Diagnosis not present

## 2023-05-06 DIAGNOSIS — Z8673 Personal history of transient ischemic attack (TIA), and cerebral infarction without residual deficits: Secondary | ICD-10-CM | POA: Diagnosis not present

## 2023-05-06 DIAGNOSIS — I700 Atherosclerosis of aorta: Secondary | ICD-10-CM | POA: Diagnosis not present

## 2023-05-06 DIAGNOSIS — N1831 Chronic kidney disease, stage 3a: Secondary | ICD-10-CM | POA: Diagnosis not present

## 2023-05-06 DIAGNOSIS — Z431 Encounter for attention to gastrostomy: Secondary | ICD-10-CM | POA: Diagnosis not present

## 2023-05-06 DIAGNOSIS — Z79899 Other long term (current) drug therapy: Secondary | ICD-10-CM | POA: Diagnosis not present

## 2023-05-06 DIAGNOSIS — Z9181 History of falling: Secondary | ICD-10-CM | POA: Diagnosis not present

## 2023-05-10 DIAGNOSIS — M50322 Other cervical disc degeneration at C5-C6 level: Secondary | ICD-10-CM | POA: Diagnosis not present

## 2023-05-10 DIAGNOSIS — E559 Vitamin D deficiency, unspecified: Secondary | ICD-10-CM | POA: Diagnosis not present

## 2023-05-10 DIAGNOSIS — N1831 Chronic kidney disease, stage 3a: Secondary | ICD-10-CM | POA: Diagnosis not present

## 2023-05-10 DIAGNOSIS — G43909 Migraine, unspecified, not intractable, without status migrainosus: Secondary | ICD-10-CM | POA: Diagnosis not present

## 2023-05-10 DIAGNOSIS — G1220 Motor neuron disease, unspecified: Secondary | ICD-10-CM | POA: Diagnosis not present

## 2023-05-10 DIAGNOSIS — I129 Hypertensive chronic kidney disease with stage 1 through stage 4 chronic kidney disease, or unspecified chronic kidney disease: Secondary | ICD-10-CM | POA: Diagnosis not present

## 2023-05-13 DIAGNOSIS — G1220 Motor neuron disease, unspecified: Secondary | ICD-10-CM | POA: Diagnosis not present

## 2023-05-13 DIAGNOSIS — M50322 Other cervical disc degeneration at C5-C6 level: Secondary | ICD-10-CM | POA: Diagnosis not present

## 2023-05-13 DIAGNOSIS — G43909 Migraine, unspecified, not intractable, without status migrainosus: Secondary | ICD-10-CM | POA: Diagnosis not present

## 2023-05-13 DIAGNOSIS — N1831 Chronic kidney disease, stage 3a: Secondary | ICD-10-CM | POA: Diagnosis not present

## 2023-05-13 DIAGNOSIS — I129 Hypertensive chronic kidney disease with stage 1 through stage 4 chronic kidney disease, or unspecified chronic kidney disease: Secondary | ICD-10-CM | POA: Diagnosis not present

## 2023-05-13 DIAGNOSIS — E559 Vitamin D deficiency, unspecified: Secondary | ICD-10-CM | POA: Diagnosis not present

## 2023-05-14 DIAGNOSIS — R633 Feeding difficulties, unspecified: Secondary | ICD-10-CM | POA: Diagnosis not present

## 2023-05-14 DIAGNOSIS — R197 Diarrhea, unspecified: Secondary | ICD-10-CM | POA: Diagnosis not present

## 2023-05-21 DIAGNOSIS — M6281 Muscle weakness (generalized): Secondary | ICD-10-CM | POA: Diagnosis not present

## 2023-05-21 DIAGNOSIS — Z7189 Other specified counseling: Secondary | ICD-10-CM | POA: Diagnosis not present

## 2023-05-21 DIAGNOSIS — R1310 Dysphagia, unspecified: Secondary | ICD-10-CM | POA: Diagnosis not present

## 2023-05-21 DIAGNOSIS — Z5181 Encounter for therapeutic drug level monitoring: Secondary | ICD-10-CM | POA: Diagnosis not present

## 2023-05-21 DIAGNOSIS — R262 Difficulty in walking, not elsewhere classified: Secondary | ICD-10-CM | POA: Diagnosis not present

## 2023-05-21 DIAGNOSIS — G1221 Amyotrophic lateral sclerosis: Secondary | ICD-10-CM | POA: Diagnosis not present

## 2023-05-24 DIAGNOSIS — I129 Hypertensive chronic kidney disease with stage 1 through stage 4 chronic kidney disease, or unspecified chronic kidney disease: Secondary | ICD-10-CM | POA: Diagnosis not present

## 2023-05-24 DIAGNOSIS — N1831 Chronic kidney disease, stage 3a: Secondary | ICD-10-CM | POA: Diagnosis not present

## 2023-05-24 DIAGNOSIS — G43909 Migraine, unspecified, not intractable, without status migrainosus: Secondary | ICD-10-CM | POA: Diagnosis not present

## 2023-05-24 DIAGNOSIS — E559 Vitamin D deficiency, unspecified: Secondary | ICD-10-CM | POA: Diagnosis not present

## 2023-05-24 DIAGNOSIS — G1220 Motor neuron disease, unspecified: Secondary | ICD-10-CM | POA: Diagnosis not present

## 2023-05-24 DIAGNOSIS — M50322 Other cervical disc degeneration at C5-C6 level: Secondary | ICD-10-CM | POA: Diagnosis not present

## 2023-05-27 ENCOUNTER — Other Ambulatory Visit: Payer: Self-pay | Admitting: Surgery

## 2023-05-27 DIAGNOSIS — E042 Nontoxic multinodular goiter: Secondary | ICD-10-CM

## 2023-05-30 ENCOUNTER — Telehealth: Payer: Self-pay

## 2023-05-30 DIAGNOSIS — M50322 Other cervical disc degeneration at C5-C6 level: Secondary | ICD-10-CM | POA: Diagnosis not present

## 2023-05-30 DIAGNOSIS — E559 Vitamin D deficiency, unspecified: Secondary | ICD-10-CM | POA: Diagnosis not present

## 2023-05-30 DIAGNOSIS — I129 Hypertensive chronic kidney disease with stage 1 through stage 4 chronic kidney disease, or unspecified chronic kidney disease: Secondary | ICD-10-CM | POA: Diagnosis not present

## 2023-05-30 DIAGNOSIS — G1220 Motor neuron disease, unspecified: Secondary | ICD-10-CM | POA: Diagnosis not present

## 2023-05-30 DIAGNOSIS — N1831 Chronic kidney disease, stage 3a: Secondary | ICD-10-CM | POA: Diagnosis not present

## 2023-05-30 DIAGNOSIS — G43909 Migraine, unspecified, not intractable, without status migrainosus: Secondary | ICD-10-CM | POA: Diagnosis not present

## 2023-05-30 NOTE — Telephone Encounter (Signed)
215 pm.  Follow up call made to patient to schedule a home visit and check on long term planning.  Call unsuccessful.  Message left requesting a callback.

## 2023-05-31 DIAGNOSIS — E559 Vitamin D deficiency, unspecified: Secondary | ICD-10-CM | POA: Diagnosis not present

## 2023-05-31 DIAGNOSIS — I129 Hypertensive chronic kidney disease with stage 1 through stage 4 chronic kidney disease, or unspecified chronic kidney disease: Secondary | ICD-10-CM | POA: Diagnosis not present

## 2023-05-31 DIAGNOSIS — N1831 Chronic kidney disease, stage 3a: Secondary | ICD-10-CM | POA: Diagnosis not present

## 2023-05-31 DIAGNOSIS — M50322 Other cervical disc degeneration at C5-C6 level: Secondary | ICD-10-CM | POA: Diagnosis not present

## 2023-05-31 DIAGNOSIS — G1220 Motor neuron disease, unspecified: Secondary | ICD-10-CM | POA: Diagnosis not present

## 2023-05-31 DIAGNOSIS — G43909 Migraine, unspecified, not intractable, without status migrainosus: Secondary | ICD-10-CM | POA: Diagnosis not present

## 2023-06-03 DIAGNOSIS — E559 Vitamin D deficiency, unspecified: Secondary | ICD-10-CM | POA: Diagnosis not present

## 2023-06-03 DIAGNOSIS — M50322 Other cervical disc degeneration at C5-C6 level: Secondary | ICD-10-CM | POA: Diagnosis not present

## 2023-06-03 DIAGNOSIS — N1831 Chronic kidney disease, stage 3a: Secondary | ICD-10-CM | POA: Diagnosis not present

## 2023-06-03 DIAGNOSIS — I129 Hypertensive chronic kidney disease with stage 1 through stage 4 chronic kidney disease, or unspecified chronic kidney disease: Secondary | ICD-10-CM | POA: Diagnosis not present

## 2023-06-03 DIAGNOSIS — G43909 Migraine, unspecified, not intractable, without status migrainosus: Secondary | ICD-10-CM | POA: Diagnosis not present

## 2023-06-03 DIAGNOSIS — G1220 Motor neuron disease, unspecified: Secondary | ICD-10-CM | POA: Diagnosis not present

## 2023-06-07 DIAGNOSIS — M62511 Muscle wasting and atrophy, not elsewhere classified, right shoulder: Secondary | ICD-10-CM | POA: Diagnosis not present

## 2023-06-07 DIAGNOSIS — R2689 Other abnormalities of gait and mobility: Secondary | ICD-10-CM | POA: Diagnosis not present

## 2023-06-07 DIAGNOSIS — G1220 Motor neuron disease, unspecified: Secondary | ICD-10-CM | POA: Diagnosis not present

## 2023-06-07 DIAGNOSIS — R278 Other lack of coordination: Secondary | ICD-10-CM | POA: Diagnosis not present

## 2023-06-07 DIAGNOSIS — R1310 Dysphagia, unspecified: Secondary | ICD-10-CM | POA: Diagnosis not present

## 2023-06-07 DIAGNOSIS — R2681 Unsteadiness on feet: Secondary | ICD-10-CM | POA: Diagnosis not present

## 2023-06-07 DIAGNOSIS — M6259 Muscle wasting and atrophy, not elsewhere classified, multiple sites: Secondary | ICD-10-CM | POA: Diagnosis not present

## 2023-06-07 DIAGNOSIS — R471 Dysarthria and anarthria: Secondary | ICD-10-CM | POA: Diagnosis not present

## 2023-06-07 DIAGNOSIS — R1312 Dysphagia, oropharyngeal phase: Secondary | ICD-10-CM | POA: Diagnosis not present

## 2023-06-10 DIAGNOSIS — M62511 Muscle wasting and atrophy, not elsewhere classified, right shoulder: Secondary | ICD-10-CM | POA: Diagnosis not present

## 2023-06-10 DIAGNOSIS — R471 Dysarthria and anarthria: Secondary | ICD-10-CM | POA: Diagnosis not present

## 2023-06-10 DIAGNOSIS — G1220 Motor neuron disease, unspecified: Secondary | ICD-10-CM | POA: Diagnosis not present

## 2023-06-10 DIAGNOSIS — R278 Other lack of coordination: Secondary | ICD-10-CM | POA: Diagnosis not present

## 2023-06-10 DIAGNOSIS — R2681 Unsteadiness on feet: Secondary | ICD-10-CM | POA: Diagnosis not present

## 2023-06-10 DIAGNOSIS — R1312 Dysphagia, oropharyngeal phase: Secondary | ICD-10-CM | POA: Diagnosis not present

## 2023-06-11 DIAGNOSIS — M62511 Muscle wasting and atrophy, not elsewhere classified, right shoulder: Secondary | ICD-10-CM | POA: Diagnosis not present

## 2023-06-11 DIAGNOSIS — R471 Dysarthria and anarthria: Secondary | ICD-10-CM | POA: Diagnosis not present

## 2023-06-11 DIAGNOSIS — R278 Other lack of coordination: Secondary | ICD-10-CM | POA: Diagnosis not present

## 2023-06-11 DIAGNOSIS — R1312 Dysphagia, oropharyngeal phase: Secondary | ICD-10-CM | POA: Diagnosis not present

## 2023-06-11 DIAGNOSIS — G1220 Motor neuron disease, unspecified: Secondary | ICD-10-CM | POA: Diagnosis not present

## 2023-06-11 DIAGNOSIS — R2681 Unsteadiness on feet: Secondary | ICD-10-CM | POA: Diagnosis not present

## 2023-06-12 DIAGNOSIS — R471 Dysarthria and anarthria: Secondary | ICD-10-CM | POA: Diagnosis not present

## 2023-06-12 DIAGNOSIS — R2681 Unsteadiness on feet: Secondary | ICD-10-CM | POA: Diagnosis not present

## 2023-06-12 DIAGNOSIS — G1220 Motor neuron disease, unspecified: Secondary | ICD-10-CM | POA: Diagnosis not present

## 2023-06-12 DIAGNOSIS — M62511 Muscle wasting and atrophy, not elsewhere classified, right shoulder: Secondary | ICD-10-CM | POA: Diagnosis not present

## 2023-06-12 DIAGNOSIS — R278 Other lack of coordination: Secondary | ICD-10-CM | POA: Diagnosis not present

## 2023-06-12 DIAGNOSIS — R1312 Dysphagia, oropharyngeal phase: Secondary | ICD-10-CM | POA: Diagnosis not present

## 2023-06-13 DIAGNOSIS — Z23 Encounter for immunization: Secondary | ICD-10-CM | POA: Diagnosis not present

## 2023-06-13 DIAGNOSIS — R1312 Dysphagia, oropharyngeal phase: Secondary | ICD-10-CM | POA: Diagnosis not present

## 2023-06-13 DIAGNOSIS — R2681 Unsteadiness on feet: Secondary | ICD-10-CM | POA: Diagnosis not present

## 2023-06-13 DIAGNOSIS — I700 Atherosclerosis of aorta: Secondary | ICD-10-CM | POA: Diagnosis not present

## 2023-06-13 DIAGNOSIS — M21372 Foot drop, left foot: Secondary | ICD-10-CM | POA: Diagnosis not present

## 2023-06-13 DIAGNOSIS — G1220 Motor neuron disease, unspecified: Secondary | ICD-10-CM | POA: Diagnosis not present

## 2023-06-13 DIAGNOSIS — Z1331 Encounter for screening for depression: Secondary | ICD-10-CM | POA: Diagnosis not present

## 2023-06-13 DIAGNOSIS — R278 Other lack of coordination: Secondary | ICD-10-CM | POA: Diagnosis not present

## 2023-06-13 DIAGNOSIS — E7800 Pure hypercholesterolemia, unspecified: Secondary | ICD-10-CM | POA: Diagnosis not present

## 2023-06-13 DIAGNOSIS — Z853 Personal history of malignant neoplasm of breast: Secondary | ICD-10-CM | POA: Diagnosis not present

## 2023-06-13 DIAGNOSIS — Z8673 Personal history of transient ischemic attack (TIA), and cerebral infarction without residual deficits: Secondary | ICD-10-CM | POA: Diagnosis not present

## 2023-06-13 DIAGNOSIS — J439 Emphysema, unspecified: Secondary | ICD-10-CM | POA: Diagnosis not present

## 2023-06-13 DIAGNOSIS — Z0000 Encounter for general adult medical examination without abnormal findings: Secondary | ICD-10-CM | POA: Diagnosis not present

## 2023-06-13 DIAGNOSIS — N1831 Chronic kidney disease, stage 3a: Secondary | ICD-10-CM | POA: Diagnosis not present

## 2023-06-13 DIAGNOSIS — R471 Dysarthria and anarthria: Secondary | ICD-10-CM | POA: Diagnosis not present

## 2023-06-13 DIAGNOSIS — E559 Vitamin D deficiency, unspecified: Secondary | ICD-10-CM | POA: Diagnosis not present

## 2023-06-13 DIAGNOSIS — M62511 Muscle wasting and atrophy, not elsewhere classified, right shoulder: Secondary | ICD-10-CM | POA: Diagnosis not present

## 2023-06-14 DIAGNOSIS — G1220 Motor neuron disease, unspecified: Secondary | ICD-10-CM | POA: Diagnosis not present

## 2023-06-14 DIAGNOSIS — R278 Other lack of coordination: Secondary | ICD-10-CM | POA: Diagnosis not present

## 2023-06-14 DIAGNOSIS — M62511 Muscle wasting and atrophy, not elsewhere classified, right shoulder: Secondary | ICD-10-CM | POA: Diagnosis not present

## 2023-06-14 DIAGNOSIS — R2681 Unsteadiness on feet: Secondary | ICD-10-CM | POA: Diagnosis not present

## 2023-06-14 DIAGNOSIS — R471 Dysarthria and anarthria: Secondary | ICD-10-CM | POA: Diagnosis not present

## 2023-06-14 DIAGNOSIS — R1312 Dysphagia, oropharyngeal phase: Secondary | ICD-10-CM | POA: Diagnosis not present

## 2023-06-17 DIAGNOSIS — G1220 Motor neuron disease, unspecified: Secondary | ICD-10-CM | POA: Diagnosis not present

## 2023-06-17 DIAGNOSIS — M62511 Muscle wasting and atrophy, not elsewhere classified, right shoulder: Secondary | ICD-10-CM | POA: Diagnosis not present

## 2023-06-17 DIAGNOSIS — R2681 Unsteadiness on feet: Secondary | ICD-10-CM | POA: Diagnosis not present

## 2023-06-17 DIAGNOSIS — R1312 Dysphagia, oropharyngeal phase: Secondary | ICD-10-CM | POA: Diagnosis not present

## 2023-06-17 DIAGNOSIS — R471 Dysarthria and anarthria: Secondary | ICD-10-CM | POA: Diagnosis not present

## 2023-06-17 DIAGNOSIS — R278 Other lack of coordination: Secondary | ICD-10-CM | POA: Diagnosis not present

## 2023-06-18 ENCOUNTER — Ambulatory Visit
Admission: RE | Admit: 2023-06-18 | Discharge: 2023-06-18 | Disposition: A | Discharge status: Home / Self Care | Payer: Medicare Other | Source: Ambulatory Visit | Attending: Surgery | Admitting: Surgery

## 2023-06-18 DIAGNOSIS — E042 Nontoxic multinodular goiter: Secondary | ICD-10-CM

## 2023-06-20 DIAGNOSIS — M62511 Muscle wasting and atrophy, not elsewhere classified, right shoulder: Secondary | ICD-10-CM | POA: Diagnosis not present

## 2023-06-20 DIAGNOSIS — R1312 Dysphagia, oropharyngeal phase: Secondary | ICD-10-CM | POA: Diagnosis not present

## 2023-06-20 DIAGNOSIS — G1220 Motor neuron disease, unspecified: Secondary | ICD-10-CM | POA: Diagnosis not present

## 2023-06-20 DIAGNOSIS — R2681 Unsteadiness on feet: Secondary | ICD-10-CM | POA: Diagnosis not present

## 2023-06-20 DIAGNOSIS — R471 Dysarthria and anarthria: Secondary | ICD-10-CM | POA: Diagnosis not present

## 2023-06-20 DIAGNOSIS — R278 Other lack of coordination: Secondary | ICD-10-CM | POA: Diagnosis not present

## 2023-06-21 DIAGNOSIS — G1220 Motor neuron disease, unspecified: Secondary | ICD-10-CM | POA: Diagnosis not present

## 2023-06-21 DIAGNOSIS — M62511 Muscle wasting and atrophy, not elsewhere classified, right shoulder: Secondary | ICD-10-CM | POA: Diagnosis not present

## 2023-06-21 DIAGNOSIS — R278 Other lack of coordination: Secondary | ICD-10-CM | POA: Diagnosis not present

## 2023-06-21 DIAGNOSIS — R2681 Unsteadiness on feet: Secondary | ICD-10-CM | POA: Diagnosis not present

## 2023-06-21 DIAGNOSIS — R471 Dysarthria and anarthria: Secondary | ICD-10-CM | POA: Diagnosis not present

## 2023-06-21 DIAGNOSIS — R1312 Dysphagia, oropharyngeal phase: Secondary | ICD-10-CM | POA: Diagnosis not present

## 2023-06-25 DIAGNOSIS — M62511 Muscle wasting and atrophy, not elsewhere classified, right shoulder: Secondary | ICD-10-CM | POA: Diagnosis not present

## 2023-06-25 DIAGNOSIS — R471 Dysarthria and anarthria: Secondary | ICD-10-CM | POA: Diagnosis not present

## 2023-06-25 DIAGNOSIS — R1310 Dysphagia, unspecified: Secondary | ICD-10-CM | POA: Diagnosis not present

## 2023-06-25 DIAGNOSIS — R2689 Other abnormalities of gait and mobility: Secondary | ICD-10-CM | POA: Diagnosis not present

## 2023-06-25 DIAGNOSIS — M6259 Muscle wasting and atrophy, not elsewhere classified, multiple sites: Secondary | ICD-10-CM | POA: Diagnosis not present

## 2023-06-25 DIAGNOSIS — R278 Other lack of coordination: Secondary | ICD-10-CM | POA: Diagnosis not present

## 2023-06-25 DIAGNOSIS — R1312 Dysphagia, oropharyngeal phase: Secondary | ICD-10-CM | POA: Diagnosis not present

## 2023-06-25 DIAGNOSIS — R2681 Unsteadiness on feet: Secondary | ICD-10-CM | POA: Diagnosis not present

## 2023-06-25 DIAGNOSIS — G1220 Motor neuron disease, unspecified: Secondary | ICD-10-CM | POA: Diagnosis not present

## 2023-06-26 DIAGNOSIS — M62511 Muscle wasting and atrophy, not elsewhere classified, right shoulder: Secondary | ICD-10-CM | POA: Diagnosis not present

## 2023-06-26 DIAGNOSIS — R278 Other lack of coordination: Secondary | ICD-10-CM | POA: Diagnosis not present

## 2023-06-26 DIAGNOSIS — R471 Dysarthria and anarthria: Secondary | ICD-10-CM | POA: Diagnosis not present

## 2023-06-26 DIAGNOSIS — G1220 Motor neuron disease, unspecified: Secondary | ICD-10-CM | POA: Diagnosis not present

## 2023-06-26 DIAGNOSIS — R2681 Unsteadiness on feet: Secondary | ICD-10-CM | POA: Diagnosis not present

## 2023-06-26 DIAGNOSIS — R1312 Dysphagia, oropharyngeal phase: Secondary | ICD-10-CM | POA: Diagnosis not present

## 2023-06-28 DIAGNOSIS — G1220 Motor neuron disease, unspecified: Secondary | ICD-10-CM | POA: Diagnosis not present

## 2023-06-28 DIAGNOSIS — R1312 Dysphagia, oropharyngeal phase: Secondary | ICD-10-CM | POA: Diagnosis not present

## 2023-06-28 DIAGNOSIS — M62511 Muscle wasting and atrophy, not elsewhere classified, right shoulder: Secondary | ICD-10-CM | POA: Diagnosis not present

## 2023-06-28 DIAGNOSIS — R278 Other lack of coordination: Secondary | ICD-10-CM | POA: Diagnosis not present

## 2023-06-28 DIAGNOSIS — R2681 Unsteadiness on feet: Secondary | ICD-10-CM | POA: Diagnosis not present

## 2023-06-28 DIAGNOSIS — R471 Dysarthria and anarthria: Secondary | ICD-10-CM | POA: Diagnosis not present

## 2023-07-02 DIAGNOSIS — R278 Other lack of coordination: Secondary | ICD-10-CM | POA: Diagnosis not present

## 2023-07-02 DIAGNOSIS — R471 Dysarthria and anarthria: Secondary | ICD-10-CM | POA: Diagnosis not present

## 2023-07-02 DIAGNOSIS — R2681 Unsteadiness on feet: Secondary | ICD-10-CM | POA: Diagnosis not present

## 2023-07-02 DIAGNOSIS — M62511 Muscle wasting and atrophy, not elsewhere classified, right shoulder: Secondary | ICD-10-CM | POA: Diagnosis not present

## 2023-07-02 DIAGNOSIS — R1312 Dysphagia, oropharyngeal phase: Secondary | ICD-10-CM | POA: Diagnosis not present

## 2023-07-02 DIAGNOSIS — G1220 Motor neuron disease, unspecified: Secondary | ICD-10-CM | POA: Diagnosis not present

## 2023-07-03 DIAGNOSIS — R471 Dysarthria and anarthria: Secondary | ICD-10-CM | POA: Diagnosis not present

## 2023-07-03 DIAGNOSIS — M62511 Muscle wasting and atrophy, not elsewhere classified, right shoulder: Secondary | ICD-10-CM | POA: Diagnosis not present

## 2023-07-03 DIAGNOSIS — R2681 Unsteadiness on feet: Secondary | ICD-10-CM | POA: Diagnosis not present

## 2023-07-03 DIAGNOSIS — G1220 Motor neuron disease, unspecified: Secondary | ICD-10-CM | POA: Diagnosis not present

## 2023-07-03 DIAGNOSIS — R278 Other lack of coordination: Secondary | ICD-10-CM | POA: Diagnosis not present

## 2023-07-03 DIAGNOSIS — R1312 Dysphagia, oropharyngeal phase: Secondary | ICD-10-CM | POA: Diagnosis not present

## 2023-07-04 DIAGNOSIS — M62511 Muscle wasting and atrophy, not elsewhere classified, right shoulder: Secondary | ICD-10-CM | POA: Diagnosis not present

## 2023-07-04 DIAGNOSIS — R1312 Dysphagia, oropharyngeal phase: Secondary | ICD-10-CM | POA: Diagnosis not present

## 2023-07-04 DIAGNOSIS — R2681 Unsteadiness on feet: Secondary | ICD-10-CM | POA: Diagnosis not present

## 2023-07-04 DIAGNOSIS — R278 Other lack of coordination: Secondary | ICD-10-CM | POA: Diagnosis not present

## 2023-07-04 DIAGNOSIS — G1220 Motor neuron disease, unspecified: Secondary | ICD-10-CM | POA: Diagnosis not present

## 2023-07-04 DIAGNOSIS — R471 Dysarthria and anarthria: Secondary | ICD-10-CM | POA: Diagnosis not present

## 2023-07-09 DIAGNOSIS — R278 Other lack of coordination: Secondary | ICD-10-CM | POA: Diagnosis not present

## 2023-07-09 DIAGNOSIS — G1220 Motor neuron disease, unspecified: Secondary | ICD-10-CM | POA: Diagnosis not present

## 2023-07-09 DIAGNOSIS — R2681 Unsteadiness on feet: Secondary | ICD-10-CM | POA: Diagnosis not present

## 2023-07-09 DIAGNOSIS — R471 Dysarthria and anarthria: Secondary | ICD-10-CM | POA: Diagnosis not present

## 2023-07-09 DIAGNOSIS — R1312 Dysphagia, oropharyngeal phase: Secondary | ICD-10-CM | POA: Diagnosis not present

## 2023-07-09 DIAGNOSIS — M62511 Muscle wasting and atrophy, not elsewhere classified, right shoulder: Secondary | ICD-10-CM | POA: Diagnosis not present

## 2023-07-10 DIAGNOSIS — G1220 Motor neuron disease, unspecified: Secondary | ICD-10-CM | POA: Diagnosis not present

## 2023-07-10 DIAGNOSIS — R471 Dysarthria and anarthria: Secondary | ICD-10-CM | POA: Diagnosis not present

## 2023-07-10 DIAGNOSIS — R1312 Dysphagia, oropharyngeal phase: Secondary | ICD-10-CM | POA: Diagnosis not present

## 2023-07-10 DIAGNOSIS — R278 Other lack of coordination: Secondary | ICD-10-CM | POA: Diagnosis not present

## 2023-07-10 DIAGNOSIS — M62511 Muscle wasting and atrophy, not elsewhere classified, right shoulder: Secondary | ICD-10-CM | POA: Diagnosis not present

## 2023-07-10 DIAGNOSIS — R2681 Unsteadiness on feet: Secondary | ICD-10-CM | POA: Diagnosis not present

## 2023-07-11 DIAGNOSIS — R1312 Dysphagia, oropharyngeal phase: Secondary | ICD-10-CM | POA: Diagnosis not present

## 2023-07-11 DIAGNOSIS — M62511 Muscle wasting and atrophy, not elsewhere classified, right shoulder: Secondary | ICD-10-CM | POA: Diagnosis not present

## 2023-07-11 DIAGNOSIS — R2681 Unsteadiness on feet: Secondary | ICD-10-CM | POA: Diagnosis not present

## 2023-07-11 DIAGNOSIS — R278 Other lack of coordination: Secondary | ICD-10-CM | POA: Diagnosis not present

## 2023-07-11 DIAGNOSIS — G1220 Motor neuron disease, unspecified: Secondary | ICD-10-CM | POA: Diagnosis not present

## 2023-07-11 DIAGNOSIS — R471 Dysarthria and anarthria: Secondary | ICD-10-CM | POA: Diagnosis not present

## 2023-07-18 DIAGNOSIS — R2681 Unsteadiness on feet: Secondary | ICD-10-CM | POA: Diagnosis not present

## 2023-07-18 DIAGNOSIS — M62511 Muscle wasting and atrophy, not elsewhere classified, right shoulder: Secondary | ICD-10-CM | POA: Diagnosis not present

## 2023-07-18 DIAGNOSIS — G1220 Motor neuron disease, unspecified: Secondary | ICD-10-CM | POA: Diagnosis not present

## 2023-07-18 DIAGNOSIS — R471 Dysarthria and anarthria: Secondary | ICD-10-CM | POA: Diagnosis not present

## 2023-07-18 DIAGNOSIS — R278 Other lack of coordination: Secondary | ICD-10-CM | POA: Diagnosis not present

## 2023-07-18 DIAGNOSIS — R1312 Dysphagia, oropharyngeal phase: Secondary | ICD-10-CM | POA: Diagnosis not present

## 2023-07-19 DIAGNOSIS — R2681 Unsteadiness on feet: Secondary | ICD-10-CM | POA: Diagnosis not present

## 2023-07-19 DIAGNOSIS — M62511 Muscle wasting and atrophy, not elsewhere classified, right shoulder: Secondary | ICD-10-CM | POA: Diagnosis not present

## 2023-07-19 DIAGNOSIS — R278 Other lack of coordination: Secondary | ICD-10-CM | POA: Diagnosis not present

## 2023-07-19 DIAGNOSIS — R471 Dysarthria and anarthria: Secondary | ICD-10-CM | POA: Diagnosis not present

## 2023-07-19 DIAGNOSIS — R1312 Dysphagia, oropharyngeal phase: Secondary | ICD-10-CM | POA: Diagnosis not present

## 2023-07-19 DIAGNOSIS — G1220 Motor neuron disease, unspecified: Secondary | ICD-10-CM | POA: Diagnosis not present

## 2023-07-22 DIAGNOSIS — G1220 Motor neuron disease, unspecified: Secondary | ICD-10-CM | POA: Diagnosis not present

## 2023-07-22 DIAGNOSIS — R1312 Dysphagia, oropharyngeal phase: Secondary | ICD-10-CM | POA: Diagnosis not present

## 2023-07-22 DIAGNOSIS — R278 Other lack of coordination: Secondary | ICD-10-CM | POA: Diagnosis not present

## 2023-07-22 DIAGNOSIS — R471 Dysarthria and anarthria: Secondary | ICD-10-CM | POA: Diagnosis not present

## 2023-07-22 DIAGNOSIS — R2681 Unsteadiness on feet: Secondary | ICD-10-CM | POA: Diagnosis not present

## 2023-07-22 DIAGNOSIS — M62511 Muscle wasting and atrophy, not elsewhere classified, right shoulder: Secondary | ICD-10-CM | POA: Diagnosis not present

## 2023-07-23 DIAGNOSIS — R471 Dysarthria and anarthria: Secondary | ICD-10-CM | POA: Diagnosis not present

## 2023-07-23 DIAGNOSIS — N189 Chronic kidney disease, unspecified: Secondary | ICD-10-CM | POA: Diagnosis not present

## 2023-07-23 DIAGNOSIS — R1312 Dysphagia, oropharyngeal phase: Secondary | ICD-10-CM | POA: Diagnosis not present

## 2023-07-23 DIAGNOSIS — Z66 Do not resuscitate: Secondary | ICD-10-CM | POA: Diagnosis not present

## 2023-07-23 DIAGNOSIS — K117 Disturbances of salivary secretion: Secondary | ICD-10-CM | POA: Diagnosis not present

## 2023-07-23 DIAGNOSIS — R1310 Dysphagia, unspecified: Secondary | ICD-10-CM | POA: Diagnosis not present

## 2023-07-23 DIAGNOSIS — R262 Difficulty in walking, not elsewhere classified: Secondary | ICD-10-CM | POA: Diagnosis not present

## 2023-07-23 DIAGNOSIS — E079 Disorder of thyroid, unspecified: Secondary | ICD-10-CM | POA: Diagnosis not present

## 2023-07-23 DIAGNOSIS — Z7902 Long term (current) use of antithrombotics/antiplatelets: Secondary | ICD-10-CM | POA: Diagnosis not present

## 2023-07-23 DIAGNOSIS — M6281 Muscle weakness (generalized): Secondary | ICD-10-CM | POA: Diagnosis not present

## 2023-07-23 DIAGNOSIS — K219 Gastro-esophageal reflux disease without esophagitis: Secondary | ICD-10-CM | POA: Diagnosis not present

## 2023-07-23 DIAGNOSIS — Z5181 Encounter for therapeutic drug level monitoring: Secondary | ICD-10-CM | POA: Diagnosis not present

## 2023-07-23 DIAGNOSIS — Z853 Personal history of malignant neoplasm of breast: Secondary | ICD-10-CM | POA: Diagnosis not present

## 2023-07-23 DIAGNOSIS — J984 Other disorders of lung: Secondary | ICD-10-CM | POA: Diagnosis not present

## 2023-07-23 DIAGNOSIS — G1221 Amyotrophic lateral sclerosis: Secondary | ICD-10-CM | POA: Diagnosis not present

## 2023-07-23 DIAGNOSIS — Z8673 Personal history of transient ischemic attack (TIA), and cerebral infarction without residual deficits: Secondary | ICD-10-CM | POA: Diagnosis not present

## 2023-07-23 DIAGNOSIS — G709 Myoneural disorder, unspecified: Secondary | ICD-10-CM | POA: Diagnosis not present

## 2023-07-23 DIAGNOSIS — Z79899 Other long term (current) drug therapy: Secondary | ICD-10-CM | POA: Diagnosis not present

## 2023-07-24 DIAGNOSIS — R2681 Unsteadiness on feet: Secondary | ICD-10-CM | POA: Diagnosis not present

## 2023-07-24 DIAGNOSIS — R278 Other lack of coordination: Secondary | ICD-10-CM | POA: Diagnosis not present

## 2023-07-24 DIAGNOSIS — M62511 Muscle wasting and atrophy, not elsewhere classified, right shoulder: Secondary | ICD-10-CM | POA: Diagnosis not present

## 2023-07-24 DIAGNOSIS — G1220 Motor neuron disease, unspecified: Secondary | ICD-10-CM | POA: Diagnosis not present

## 2023-07-24 DIAGNOSIS — R471 Dysarthria and anarthria: Secondary | ICD-10-CM | POA: Diagnosis not present

## 2023-07-24 DIAGNOSIS — R1312 Dysphagia, oropharyngeal phase: Secondary | ICD-10-CM | POA: Diagnosis not present

## 2023-07-25 DIAGNOSIS — R471 Dysarthria and anarthria: Secondary | ICD-10-CM | POA: Diagnosis not present

## 2023-07-25 DIAGNOSIS — G1220 Motor neuron disease, unspecified: Secondary | ICD-10-CM | POA: Diagnosis not present

## 2023-07-25 DIAGNOSIS — R2689 Other abnormalities of gait and mobility: Secondary | ICD-10-CM | POA: Diagnosis not present

## 2023-07-25 DIAGNOSIS — M6259 Muscle wasting and atrophy, not elsewhere classified, multiple sites: Secondary | ICD-10-CM | POA: Diagnosis not present

## 2023-07-25 DIAGNOSIS — M62511 Muscle wasting and atrophy, not elsewhere classified, right shoulder: Secondary | ICD-10-CM | POA: Diagnosis not present

## 2023-07-25 DIAGNOSIS — R2681 Unsteadiness on feet: Secondary | ICD-10-CM | POA: Diagnosis not present

## 2023-07-25 DIAGNOSIS — R1310 Dysphagia, unspecified: Secondary | ICD-10-CM | POA: Diagnosis not present

## 2023-07-25 DIAGNOSIS — R278 Other lack of coordination: Secondary | ICD-10-CM | POA: Diagnosis not present

## 2023-07-25 DIAGNOSIS — R1312 Dysphagia, oropharyngeal phase: Secondary | ICD-10-CM | POA: Diagnosis not present

## 2023-07-29 DIAGNOSIS — R471 Dysarthria and anarthria: Secondary | ICD-10-CM | POA: Diagnosis not present

## 2023-07-29 DIAGNOSIS — R2681 Unsteadiness on feet: Secondary | ICD-10-CM | POA: Diagnosis not present

## 2023-07-29 DIAGNOSIS — M62511 Muscle wasting and atrophy, not elsewhere classified, right shoulder: Secondary | ICD-10-CM | POA: Diagnosis not present

## 2023-07-29 DIAGNOSIS — R278 Other lack of coordination: Secondary | ICD-10-CM | POA: Diagnosis not present

## 2023-07-29 DIAGNOSIS — G1220 Motor neuron disease, unspecified: Secondary | ICD-10-CM | POA: Diagnosis not present

## 2023-07-29 DIAGNOSIS — R1312 Dysphagia, oropharyngeal phase: Secondary | ICD-10-CM | POA: Diagnosis not present

## 2023-07-31 DIAGNOSIS — G1220 Motor neuron disease, unspecified: Secondary | ICD-10-CM | POA: Diagnosis not present

## 2023-07-31 DIAGNOSIS — R1312 Dysphagia, oropharyngeal phase: Secondary | ICD-10-CM | POA: Diagnosis not present

## 2023-07-31 DIAGNOSIS — R2681 Unsteadiness on feet: Secondary | ICD-10-CM | POA: Diagnosis not present

## 2023-07-31 DIAGNOSIS — R278 Other lack of coordination: Secondary | ICD-10-CM | POA: Diagnosis not present

## 2023-07-31 DIAGNOSIS — M62511 Muscle wasting and atrophy, not elsewhere classified, right shoulder: Secondary | ICD-10-CM | POA: Diagnosis not present

## 2023-07-31 DIAGNOSIS — R471 Dysarthria and anarthria: Secondary | ICD-10-CM | POA: Diagnosis not present

## 2023-08-01 DIAGNOSIS — G1220 Motor neuron disease, unspecified: Secondary | ICD-10-CM | POA: Diagnosis not present

## 2023-08-01 DIAGNOSIS — R1312 Dysphagia, oropharyngeal phase: Secondary | ICD-10-CM | POA: Diagnosis not present

## 2023-08-01 DIAGNOSIS — M62511 Muscle wasting and atrophy, not elsewhere classified, right shoulder: Secondary | ICD-10-CM | POA: Diagnosis not present

## 2023-08-01 DIAGNOSIS — R471 Dysarthria and anarthria: Secondary | ICD-10-CM | POA: Diagnosis not present

## 2023-08-01 DIAGNOSIS — R2681 Unsteadiness on feet: Secondary | ICD-10-CM | POA: Diagnosis not present

## 2023-08-01 DIAGNOSIS — R278 Other lack of coordination: Secondary | ICD-10-CM | POA: Diagnosis not present

## 2023-08-05 DIAGNOSIS — R2681 Unsteadiness on feet: Secondary | ICD-10-CM | POA: Diagnosis not present

## 2023-08-05 DIAGNOSIS — M62511 Muscle wasting and atrophy, not elsewhere classified, right shoulder: Secondary | ICD-10-CM | POA: Diagnosis not present

## 2023-08-05 DIAGNOSIS — R471 Dysarthria and anarthria: Secondary | ICD-10-CM | POA: Diagnosis not present

## 2023-08-05 DIAGNOSIS — R278 Other lack of coordination: Secondary | ICD-10-CM | POA: Diagnosis not present

## 2023-08-05 DIAGNOSIS — G1220 Motor neuron disease, unspecified: Secondary | ICD-10-CM | POA: Diagnosis not present

## 2023-08-05 DIAGNOSIS — R1312 Dysphagia, oropharyngeal phase: Secondary | ICD-10-CM | POA: Diagnosis not present

## 2023-08-06 DIAGNOSIS — R471 Dysarthria and anarthria: Secondary | ICD-10-CM | POA: Diagnosis not present

## 2023-08-06 DIAGNOSIS — R278 Other lack of coordination: Secondary | ICD-10-CM | POA: Diagnosis not present

## 2023-08-06 DIAGNOSIS — M62511 Muscle wasting and atrophy, not elsewhere classified, right shoulder: Secondary | ICD-10-CM | POA: Diagnosis not present

## 2023-08-06 DIAGNOSIS — R2681 Unsteadiness on feet: Secondary | ICD-10-CM | POA: Diagnosis not present

## 2023-08-06 DIAGNOSIS — R1312 Dysphagia, oropharyngeal phase: Secondary | ICD-10-CM | POA: Diagnosis not present

## 2023-08-06 DIAGNOSIS — G1220 Motor neuron disease, unspecified: Secondary | ICD-10-CM | POA: Diagnosis not present

## 2023-08-12 DIAGNOSIS — G1220 Motor neuron disease, unspecified: Secondary | ICD-10-CM | POA: Diagnosis not present

## 2023-08-12 DIAGNOSIS — D649 Anemia, unspecified: Secondary | ICD-10-CM | POA: Diagnosis not present

## 2023-08-12 DIAGNOSIS — R1312 Dysphagia, oropharyngeal phase: Secondary | ICD-10-CM | POA: Diagnosis not present

## 2023-08-12 DIAGNOSIS — R471 Dysarthria and anarthria: Secondary | ICD-10-CM | POA: Diagnosis not present

## 2023-08-12 DIAGNOSIS — R2681 Unsteadiness on feet: Secondary | ICD-10-CM | POA: Diagnosis not present

## 2023-08-12 DIAGNOSIS — M62511 Muscle wasting and atrophy, not elsewhere classified, right shoulder: Secondary | ICD-10-CM | POA: Diagnosis not present

## 2023-08-12 DIAGNOSIS — R278 Other lack of coordination: Secondary | ICD-10-CM | POA: Diagnosis not present

## 2023-08-13 DIAGNOSIS — R1312 Dysphagia, oropharyngeal phase: Secondary | ICD-10-CM | POA: Diagnosis not present

## 2023-08-13 DIAGNOSIS — M62511 Muscle wasting and atrophy, not elsewhere classified, right shoulder: Secondary | ICD-10-CM | POA: Diagnosis not present

## 2023-08-13 DIAGNOSIS — R2681 Unsteadiness on feet: Secondary | ICD-10-CM | POA: Diagnosis not present

## 2023-08-13 DIAGNOSIS — G1220 Motor neuron disease, unspecified: Secondary | ICD-10-CM | POA: Diagnosis not present

## 2023-08-13 DIAGNOSIS — R471 Dysarthria and anarthria: Secondary | ICD-10-CM | POA: Diagnosis not present

## 2023-08-13 DIAGNOSIS — R278 Other lack of coordination: Secondary | ICD-10-CM | POA: Diagnosis not present

## 2023-08-14 DIAGNOSIS — R471 Dysarthria and anarthria: Secondary | ICD-10-CM | POA: Diagnosis not present

## 2023-08-14 DIAGNOSIS — G1220 Motor neuron disease, unspecified: Secondary | ICD-10-CM | POA: Diagnosis not present

## 2023-08-14 DIAGNOSIS — R1312 Dysphagia, oropharyngeal phase: Secondary | ICD-10-CM | POA: Diagnosis not present

## 2023-08-14 DIAGNOSIS — R278 Other lack of coordination: Secondary | ICD-10-CM | POA: Diagnosis not present

## 2023-08-14 DIAGNOSIS — M62511 Muscle wasting and atrophy, not elsewhere classified, right shoulder: Secondary | ICD-10-CM | POA: Diagnosis not present

## 2023-08-14 DIAGNOSIS — R2681 Unsteadiness on feet: Secondary | ICD-10-CM | POA: Diagnosis not present

## 2023-08-15 DIAGNOSIS — R278 Other lack of coordination: Secondary | ICD-10-CM | POA: Diagnosis not present

## 2023-08-15 DIAGNOSIS — R2681 Unsteadiness on feet: Secondary | ICD-10-CM | POA: Diagnosis not present

## 2023-08-15 DIAGNOSIS — G1220 Motor neuron disease, unspecified: Secondary | ICD-10-CM | POA: Diagnosis not present

## 2023-08-15 DIAGNOSIS — M62511 Muscle wasting and atrophy, not elsewhere classified, right shoulder: Secondary | ICD-10-CM | POA: Diagnosis not present

## 2023-08-15 DIAGNOSIS — R471 Dysarthria and anarthria: Secondary | ICD-10-CM | POA: Diagnosis not present

## 2023-08-15 DIAGNOSIS — R1312 Dysphagia, oropharyngeal phase: Secondary | ICD-10-CM | POA: Diagnosis not present

## 2023-08-20 DIAGNOSIS — R471 Dysarthria and anarthria: Secondary | ICD-10-CM | POA: Diagnosis not present

## 2023-08-20 DIAGNOSIS — G1220 Motor neuron disease, unspecified: Secondary | ICD-10-CM | POA: Diagnosis not present

## 2023-08-20 DIAGNOSIS — R278 Other lack of coordination: Secondary | ICD-10-CM | POA: Diagnosis not present

## 2023-08-20 DIAGNOSIS — R2681 Unsteadiness on feet: Secondary | ICD-10-CM | POA: Diagnosis not present

## 2023-08-20 DIAGNOSIS — M62511 Muscle wasting and atrophy, not elsewhere classified, right shoulder: Secondary | ICD-10-CM | POA: Diagnosis not present

## 2023-08-20 DIAGNOSIS — R1312 Dysphagia, oropharyngeal phase: Secondary | ICD-10-CM | POA: Diagnosis not present

## 2023-08-21 DIAGNOSIS — G1220 Motor neuron disease, unspecified: Secondary | ICD-10-CM | POA: Diagnosis not present

## 2023-08-21 DIAGNOSIS — R471 Dysarthria and anarthria: Secondary | ICD-10-CM | POA: Diagnosis not present

## 2023-08-21 DIAGNOSIS — R2681 Unsteadiness on feet: Secondary | ICD-10-CM | POA: Diagnosis not present

## 2023-08-21 DIAGNOSIS — R278 Other lack of coordination: Secondary | ICD-10-CM | POA: Diagnosis not present

## 2023-08-21 DIAGNOSIS — M62511 Muscle wasting and atrophy, not elsewhere classified, right shoulder: Secondary | ICD-10-CM | POA: Diagnosis not present

## 2023-08-21 DIAGNOSIS — R1312 Dysphagia, oropharyngeal phase: Secondary | ICD-10-CM | POA: Diagnosis not present

## 2023-08-27 DIAGNOSIS — M62511 Muscle wasting and atrophy, not elsewhere classified, right shoulder: Secondary | ICD-10-CM | POA: Diagnosis not present

## 2023-08-27 DIAGNOSIS — R2689 Other abnormalities of gait and mobility: Secondary | ICD-10-CM | POA: Diagnosis not present

## 2023-08-27 DIAGNOSIS — R278 Other lack of coordination: Secondary | ICD-10-CM | POA: Diagnosis not present

## 2023-08-27 DIAGNOSIS — R471 Dysarthria and anarthria: Secondary | ICD-10-CM | POA: Diagnosis not present

## 2023-08-27 DIAGNOSIS — G1220 Motor neuron disease, unspecified: Secondary | ICD-10-CM | POA: Diagnosis not present

## 2023-08-27 DIAGNOSIS — R1310 Dysphagia, unspecified: Secondary | ICD-10-CM | POA: Diagnosis not present

## 2023-08-27 DIAGNOSIS — M6259 Muscle wasting and atrophy, not elsewhere classified, multiple sites: Secondary | ICD-10-CM | POA: Diagnosis not present

## 2023-08-27 DIAGNOSIS — R1312 Dysphagia, oropharyngeal phase: Secondary | ICD-10-CM | POA: Diagnosis not present

## 2023-08-27 DIAGNOSIS — R2681 Unsteadiness on feet: Secondary | ICD-10-CM | POA: Diagnosis not present

## 2023-08-28 DIAGNOSIS — M62511 Muscle wasting and atrophy, not elsewhere classified, right shoulder: Secondary | ICD-10-CM | POA: Diagnosis not present

## 2023-08-28 DIAGNOSIS — R278 Other lack of coordination: Secondary | ICD-10-CM | POA: Diagnosis not present

## 2023-08-28 DIAGNOSIS — R471 Dysarthria and anarthria: Secondary | ICD-10-CM | POA: Diagnosis not present

## 2023-08-28 DIAGNOSIS — G1220 Motor neuron disease, unspecified: Secondary | ICD-10-CM | POA: Diagnosis not present

## 2023-08-28 DIAGNOSIS — R1312 Dysphagia, oropharyngeal phase: Secondary | ICD-10-CM | POA: Diagnosis not present

## 2023-08-28 DIAGNOSIS — R2681 Unsteadiness on feet: Secondary | ICD-10-CM | POA: Diagnosis not present

## 2023-08-29 DIAGNOSIS — R471 Dysarthria and anarthria: Secondary | ICD-10-CM | POA: Diagnosis not present

## 2023-08-29 DIAGNOSIS — G1220 Motor neuron disease, unspecified: Secondary | ICD-10-CM | POA: Diagnosis not present

## 2023-08-29 DIAGNOSIS — R2681 Unsteadiness on feet: Secondary | ICD-10-CM | POA: Diagnosis not present

## 2023-08-29 DIAGNOSIS — M62511 Muscle wasting and atrophy, not elsewhere classified, right shoulder: Secondary | ICD-10-CM | POA: Diagnosis not present

## 2023-08-29 DIAGNOSIS — R278 Other lack of coordination: Secondary | ICD-10-CM | POA: Diagnosis not present

## 2023-08-29 DIAGNOSIS — R1312 Dysphagia, oropharyngeal phase: Secondary | ICD-10-CM | POA: Diagnosis not present

## 2023-08-30 DIAGNOSIS — M62511 Muscle wasting and atrophy, not elsewhere classified, right shoulder: Secondary | ICD-10-CM | POA: Diagnosis not present

## 2023-08-30 DIAGNOSIS — R1312 Dysphagia, oropharyngeal phase: Secondary | ICD-10-CM | POA: Diagnosis not present

## 2023-08-30 DIAGNOSIS — G1220 Motor neuron disease, unspecified: Secondary | ICD-10-CM | POA: Diagnosis not present

## 2023-08-30 DIAGNOSIS — R278 Other lack of coordination: Secondary | ICD-10-CM | POA: Diagnosis not present

## 2023-08-30 DIAGNOSIS — R2681 Unsteadiness on feet: Secondary | ICD-10-CM | POA: Diagnosis not present

## 2023-08-30 DIAGNOSIS — R471 Dysarthria and anarthria: Secondary | ICD-10-CM | POA: Diagnosis not present

## 2023-09-02 DIAGNOSIS — R1312 Dysphagia, oropharyngeal phase: Secondary | ICD-10-CM | POA: Diagnosis not present

## 2023-09-02 DIAGNOSIS — G1220 Motor neuron disease, unspecified: Secondary | ICD-10-CM | POA: Diagnosis not present

## 2023-09-02 DIAGNOSIS — R471 Dysarthria and anarthria: Secondary | ICD-10-CM | POA: Diagnosis not present

## 2023-09-02 DIAGNOSIS — R2681 Unsteadiness on feet: Secondary | ICD-10-CM | POA: Diagnosis not present

## 2023-09-02 DIAGNOSIS — R278 Other lack of coordination: Secondary | ICD-10-CM | POA: Diagnosis not present

## 2023-09-02 DIAGNOSIS — M62511 Muscle wasting and atrophy, not elsewhere classified, right shoulder: Secondary | ICD-10-CM | POA: Diagnosis not present

## 2023-09-03 DIAGNOSIS — R471 Dysarthria and anarthria: Secondary | ICD-10-CM | POA: Diagnosis not present

## 2023-09-03 DIAGNOSIS — R1312 Dysphagia, oropharyngeal phase: Secondary | ICD-10-CM | POA: Diagnosis not present

## 2023-09-03 DIAGNOSIS — M62511 Muscle wasting and atrophy, not elsewhere classified, right shoulder: Secondary | ICD-10-CM | POA: Diagnosis not present

## 2023-09-03 DIAGNOSIS — G1220 Motor neuron disease, unspecified: Secondary | ICD-10-CM | POA: Diagnosis not present

## 2023-09-03 DIAGNOSIS — R2681 Unsteadiness on feet: Secondary | ICD-10-CM | POA: Diagnosis not present

## 2023-09-03 DIAGNOSIS — R278 Other lack of coordination: Secondary | ICD-10-CM | POA: Diagnosis not present

## 2023-09-04 DIAGNOSIS — M62511 Muscle wasting and atrophy, not elsewhere classified, right shoulder: Secondary | ICD-10-CM | POA: Diagnosis not present

## 2023-09-04 DIAGNOSIS — R1312 Dysphagia, oropharyngeal phase: Secondary | ICD-10-CM | POA: Diagnosis not present

## 2023-09-04 DIAGNOSIS — R2681 Unsteadiness on feet: Secondary | ICD-10-CM | POA: Diagnosis not present

## 2023-09-04 DIAGNOSIS — R278 Other lack of coordination: Secondary | ICD-10-CM | POA: Diagnosis not present

## 2023-09-04 DIAGNOSIS — R471 Dysarthria and anarthria: Secondary | ICD-10-CM | POA: Diagnosis not present

## 2023-09-04 DIAGNOSIS — G1220 Motor neuron disease, unspecified: Secondary | ICD-10-CM | POA: Diagnosis not present

## 2023-09-05 DIAGNOSIS — G1220 Motor neuron disease, unspecified: Secondary | ICD-10-CM | POA: Diagnosis not present

## 2023-09-05 DIAGNOSIS — R278 Other lack of coordination: Secondary | ICD-10-CM | POA: Diagnosis not present

## 2023-09-05 DIAGNOSIS — R1312 Dysphagia, oropharyngeal phase: Secondary | ICD-10-CM | POA: Diagnosis not present

## 2023-09-05 DIAGNOSIS — M62511 Muscle wasting and atrophy, not elsewhere classified, right shoulder: Secondary | ICD-10-CM | POA: Diagnosis not present

## 2023-09-05 DIAGNOSIS — R471 Dysarthria and anarthria: Secondary | ICD-10-CM | POA: Diagnosis not present

## 2023-09-05 DIAGNOSIS — R2681 Unsteadiness on feet: Secondary | ICD-10-CM | POA: Diagnosis not present

## 2023-09-09 DIAGNOSIS — G1220 Motor neuron disease, unspecified: Secondary | ICD-10-CM | POA: Diagnosis not present

## 2023-09-09 DIAGNOSIS — R278 Other lack of coordination: Secondary | ICD-10-CM | POA: Diagnosis not present

## 2023-09-09 DIAGNOSIS — R471 Dysarthria and anarthria: Secondary | ICD-10-CM | POA: Diagnosis not present

## 2023-09-09 DIAGNOSIS — R1312 Dysphagia, oropharyngeal phase: Secondary | ICD-10-CM | POA: Diagnosis not present

## 2023-09-09 DIAGNOSIS — M62511 Muscle wasting and atrophy, not elsewhere classified, right shoulder: Secondary | ICD-10-CM | POA: Diagnosis not present

## 2023-09-09 DIAGNOSIS — R2681 Unsteadiness on feet: Secondary | ICD-10-CM | POA: Diagnosis not present

## 2023-09-10 DIAGNOSIS — G1220 Motor neuron disease, unspecified: Secondary | ICD-10-CM | POA: Diagnosis not present

## 2023-09-10 DIAGNOSIS — R471 Dysarthria and anarthria: Secondary | ICD-10-CM | POA: Diagnosis not present

## 2023-09-10 DIAGNOSIS — R278 Other lack of coordination: Secondary | ICD-10-CM | POA: Diagnosis not present

## 2023-09-10 DIAGNOSIS — M62511 Muscle wasting and atrophy, not elsewhere classified, right shoulder: Secondary | ICD-10-CM | POA: Diagnosis not present

## 2023-09-10 DIAGNOSIS — R2681 Unsteadiness on feet: Secondary | ICD-10-CM | POA: Diagnosis not present

## 2023-09-10 DIAGNOSIS — R1312 Dysphagia, oropharyngeal phase: Secondary | ICD-10-CM | POA: Diagnosis not present

## 2023-09-11 DIAGNOSIS — G1220 Motor neuron disease, unspecified: Secondary | ICD-10-CM | POA: Diagnosis not present

## 2023-09-11 DIAGNOSIS — M62511 Muscle wasting and atrophy, not elsewhere classified, right shoulder: Secondary | ICD-10-CM | POA: Diagnosis not present

## 2023-09-11 DIAGNOSIS — R471 Dysarthria and anarthria: Secondary | ICD-10-CM | POA: Diagnosis not present

## 2023-09-11 DIAGNOSIS — R278 Other lack of coordination: Secondary | ICD-10-CM | POA: Diagnosis not present

## 2023-09-11 DIAGNOSIS — R1312 Dysphagia, oropharyngeal phase: Secondary | ICD-10-CM | POA: Diagnosis not present

## 2023-09-11 DIAGNOSIS — R2681 Unsteadiness on feet: Secondary | ICD-10-CM | POA: Diagnosis not present

## 2023-09-12 DIAGNOSIS — M62511 Muscle wasting and atrophy, not elsewhere classified, right shoulder: Secondary | ICD-10-CM | POA: Diagnosis not present

## 2023-09-12 DIAGNOSIS — R471 Dysarthria and anarthria: Secondary | ICD-10-CM | POA: Diagnosis not present

## 2023-09-12 DIAGNOSIS — R1312 Dysphagia, oropharyngeal phase: Secondary | ICD-10-CM | POA: Diagnosis not present

## 2023-09-12 DIAGNOSIS — R2681 Unsteadiness on feet: Secondary | ICD-10-CM | POA: Diagnosis not present

## 2023-09-12 DIAGNOSIS — G1220 Motor neuron disease, unspecified: Secondary | ICD-10-CM | POA: Diagnosis not present

## 2023-09-12 DIAGNOSIS — R278 Other lack of coordination: Secondary | ICD-10-CM | POA: Diagnosis not present

## 2023-09-16 DIAGNOSIS — R278 Other lack of coordination: Secondary | ICD-10-CM | POA: Diagnosis not present

## 2023-09-16 DIAGNOSIS — R2681 Unsteadiness on feet: Secondary | ICD-10-CM | POA: Diagnosis not present

## 2023-09-16 DIAGNOSIS — R1312 Dysphagia, oropharyngeal phase: Secondary | ICD-10-CM | POA: Diagnosis not present

## 2023-09-16 DIAGNOSIS — G1220 Motor neuron disease, unspecified: Secondary | ICD-10-CM | POA: Diagnosis not present

## 2023-09-16 DIAGNOSIS — R471 Dysarthria and anarthria: Secondary | ICD-10-CM | POA: Diagnosis not present

## 2023-09-16 DIAGNOSIS — M62511 Muscle wasting and atrophy, not elsewhere classified, right shoulder: Secondary | ICD-10-CM | POA: Diagnosis not present

## 2023-09-18 DIAGNOSIS — R2681 Unsteadiness on feet: Secondary | ICD-10-CM | POA: Diagnosis not present

## 2023-09-18 DIAGNOSIS — R471 Dysarthria and anarthria: Secondary | ICD-10-CM | POA: Diagnosis not present

## 2023-09-18 DIAGNOSIS — G1220 Motor neuron disease, unspecified: Secondary | ICD-10-CM | POA: Diagnosis not present

## 2023-09-18 DIAGNOSIS — R278 Other lack of coordination: Secondary | ICD-10-CM | POA: Diagnosis not present

## 2023-09-18 DIAGNOSIS — R1312 Dysphagia, oropharyngeal phase: Secondary | ICD-10-CM | POA: Diagnosis not present

## 2023-09-18 DIAGNOSIS — M62511 Muscle wasting and atrophy, not elsewhere classified, right shoulder: Secondary | ICD-10-CM | POA: Diagnosis not present

## 2023-09-19 DIAGNOSIS — M62511 Muscle wasting and atrophy, not elsewhere classified, right shoulder: Secondary | ICD-10-CM | POA: Diagnosis not present

## 2023-09-19 DIAGNOSIS — G1220 Motor neuron disease, unspecified: Secondary | ICD-10-CM | POA: Diagnosis not present

## 2023-09-19 DIAGNOSIS — R2681 Unsteadiness on feet: Secondary | ICD-10-CM | POA: Diagnosis not present

## 2023-09-19 DIAGNOSIS — R1312 Dysphagia, oropharyngeal phase: Secondary | ICD-10-CM | POA: Diagnosis not present

## 2023-09-19 DIAGNOSIS — R471 Dysarthria and anarthria: Secondary | ICD-10-CM | POA: Diagnosis not present

## 2023-09-19 DIAGNOSIS — R278 Other lack of coordination: Secondary | ICD-10-CM | POA: Diagnosis not present

## 2023-09-20 DIAGNOSIS — R471 Dysarthria and anarthria: Secondary | ICD-10-CM | POA: Diagnosis not present

## 2023-09-20 DIAGNOSIS — G1220 Motor neuron disease, unspecified: Secondary | ICD-10-CM | POA: Diagnosis not present

## 2023-09-20 DIAGNOSIS — R1312 Dysphagia, oropharyngeal phase: Secondary | ICD-10-CM | POA: Diagnosis not present

## 2023-09-20 DIAGNOSIS — M62511 Muscle wasting and atrophy, not elsewhere classified, right shoulder: Secondary | ICD-10-CM | POA: Diagnosis not present

## 2023-09-20 DIAGNOSIS — R278 Other lack of coordination: Secondary | ICD-10-CM | POA: Diagnosis not present

## 2023-09-20 DIAGNOSIS — R2681 Unsteadiness on feet: Secondary | ICD-10-CM | POA: Diagnosis not present

## 2023-09-23 DIAGNOSIS — R471 Dysarthria and anarthria: Secondary | ICD-10-CM | POA: Diagnosis not present

## 2023-09-23 DIAGNOSIS — G1220 Motor neuron disease, unspecified: Secondary | ICD-10-CM | POA: Diagnosis not present

## 2023-09-23 DIAGNOSIS — R2681 Unsteadiness on feet: Secondary | ICD-10-CM | POA: Diagnosis not present

## 2023-09-23 DIAGNOSIS — R278 Other lack of coordination: Secondary | ICD-10-CM | POA: Diagnosis not present

## 2023-09-23 DIAGNOSIS — M62511 Muscle wasting and atrophy, not elsewhere classified, right shoulder: Secondary | ICD-10-CM | POA: Diagnosis not present

## 2023-09-23 DIAGNOSIS — R1312 Dysphagia, oropharyngeal phase: Secondary | ICD-10-CM | POA: Diagnosis not present

## 2023-09-24 DIAGNOSIS — M6259 Muscle wasting and atrophy, not elsewhere classified, multiple sites: Secondary | ICD-10-CM | POA: Diagnosis not present

## 2023-09-24 DIAGNOSIS — R2681 Unsteadiness on feet: Secondary | ICD-10-CM | POA: Diagnosis not present

## 2023-09-24 DIAGNOSIS — R278 Other lack of coordination: Secondary | ICD-10-CM | POA: Diagnosis not present

## 2023-09-24 DIAGNOSIS — R2689 Other abnormalities of gait and mobility: Secondary | ICD-10-CM | POA: Diagnosis not present

## 2023-09-24 DIAGNOSIS — R471 Dysarthria and anarthria: Secondary | ICD-10-CM | POA: Diagnosis not present

## 2023-09-24 DIAGNOSIS — G1220 Motor neuron disease, unspecified: Secondary | ICD-10-CM | POA: Diagnosis not present

## 2023-09-24 DIAGNOSIS — R1310 Dysphagia, unspecified: Secondary | ICD-10-CM | POA: Diagnosis not present

## 2023-09-24 DIAGNOSIS — M62511 Muscle wasting and atrophy, not elsewhere classified, right shoulder: Secondary | ICD-10-CM | POA: Diagnosis not present

## 2023-09-24 DIAGNOSIS — R1312 Dysphagia, oropharyngeal phase: Secondary | ICD-10-CM | POA: Diagnosis not present

## 2023-09-25 DIAGNOSIS — R2681 Unsteadiness on feet: Secondary | ICD-10-CM | POA: Diagnosis not present

## 2023-09-25 DIAGNOSIS — M62511 Muscle wasting and atrophy, not elsewhere classified, right shoulder: Secondary | ICD-10-CM | POA: Diagnosis not present

## 2023-09-25 DIAGNOSIS — R471 Dysarthria and anarthria: Secondary | ICD-10-CM | POA: Diagnosis not present

## 2023-09-25 DIAGNOSIS — R278 Other lack of coordination: Secondary | ICD-10-CM | POA: Diagnosis not present

## 2023-09-25 DIAGNOSIS — R1312 Dysphagia, oropharyngeal phase: Secondary | ICD-10-CM | POA: Diagnosis not present

## 2023-09-25 DIAGNOSIS — G1220 Motor neuron disease, unspecified: Secondary | ICD-10-CM | POA: Diagnosis not present

## 2023-09-27 DIAGNOSIS — M62511 Muscle wasting and atrophy, not elsewhere classified, right shoulder: Secondary | ICD-10-CM | POA: Diagnosis not present

## 2023-09-27 DIAGNOSIS — R1312 Dysphagia, oropharyngeal phase: Secondary | ICD-10-CM | POA: Diagnosis not present

## 2023-09-27 DIAGNOSIS — R278 Other lack of coordination: Secondary | ICD-10-CM | POA: Diagnosis not present

## 2023-09-27 DIAGNOSIS — G1220 Motor neuron disease, unspecified: Secondary | ICD-10-CM | POA: Diagnosis not present

## 2023-09-27 DIAGNOSIS — R2681 Unsteadiness on feet: Secondary | ICD-10-CM | POA: Diagnosis not present

## 2023-09-27 DIAGNOSIS — R471 Dysarthria and anarthria: Secondary | ICD-10-CM | POA: Diagnosis not present

## 2023-09-30 DIAGNOSIS — R471 Dysarthria and anarthria: Secondary | ICD-10-CM | POA: Diagnosis not present

## 2023-09-30 DIAGNOSIS — R2681 Unsteadiness on feet: Secondary | ICD-10-CM | POA: Diagnosis not present

## 2023-09-30 DIAGNOSIS — R278 Other lack of coordination: Secondary | ICD-10-CM | POA: Diagnosis not present

## 2023-09-30 DIAGNOSIS — M62511 Muscle wasting and atrophy, not elsewhere classified, right shoulder: Secondary | ICD-10-CM | POA: Diagnosis not present

## 2023-09-30 DIAGNOSIS — R1312 Dysphagia, oropharyngeal phase: Secondary | ICD-10-CM | POA: Diagnosis not present

## 2023-09-30 DIAGNOSIS — G1220 Motor neuron disease, unspecified: Secondary | ICD-10-CM | POA: Diagnosis not present

## 2023-10-01 DIAGNOSIS — R471 Dysarthria and anarthria: Secondary | ICD-10-CM | POA: Diagnosis not present

## 2023-10-01 DIAGNOSIS — M6281 Muscle weakness (generalized): Secondary | ICD-10-CM | POA: Diagnosis not present

## 2023-10-01 DIAGNOSIS — R262 Difficulty in walking, not elsewhere classified: Secondary | ICD-10-CM | POA: Diagnosis not present

## 2023-10-01 DIAGNOSIS — Z5181 Encounter for therapeutic drug level monitoring: Secondary | ICD-10-CM | POA: Diagnosis not present

## 2023-10-01 DIAGNOSIS — R634 Abnormal weight loss: Secondary | ICD-10-CM | POA: Diagnosis not present

## 2023-10-01 DIAGNOSIS — Z681 Body mass index (BMI) 19 or less, adult: Secondary | ICD-10-CM | POA: Diagnosis not present

## 2023-10-01 DIAGNOSIS — G1221 Amyotrophic lateral sclerosis: Secondary | ICD-10-CM | POA: Diagnosis not present

## 2023-10-01 DIAGNOSIS — K117 Disturbances of salivary secretion: Secondary | ICD-10-CM | POA: Diagnosis not present

## 2023-10-02 DIAGNOSIS — R2681 Unsteadiness on feet: Secondary | ICD-10-CM | POA: Diagnosis not present

## 2023-10-02 DIAGNOSIS — G1220 Motor neuron disease, unspecified: Secondary | ICD-10-CM | POA: Diagnosis not present

## 2023-10-02 DIAGNOSIS — R1312 Dysphagia, oropharyngeal phase: Secondary | ICD-10-CM | POA: Diagnosis not present

## 2023-10-02 DIAGNOSIS — R471 Dysarthria and anarthria: Secondary | ICD-10-CM | POA: Diagnosis not present

## 2023-10-02 DIAGNOSIS — M62511 Muscle wasting and atrophy, not elsewhere classified, right shoulder: Secondary | ICD-10-CM | POA: Diagnosis not present

## 2023-10-02 DIAGNOSIS — R278 Other lack of coordination: Secondary | ICD-10-CM | POA: Diagnosis not present

## 2023-10-03 DIAGNOSIS — R2681 Unsteadiness on feet: Secondary | ICD-10-CM | POA: Diagnosis not present

## 2023-10-03 DIAGNOSIS — G1220 Motor neuron disease, unspecified: Secondary | ICD-10-CM | POA: Diagnosis not present

## 2023-10-03 DIAGNOSIS — R471 Dysarthria and anarthria: Secondary | ICD-10-CM | POA: Diagnosis not present

## 2023-10-03 DIAGNOSIS — M62511 Muscle wasting and atrophy, not elsewhere classified, right shoulder: Secondary | ICD-10-CM | POA: Diagnosis not present

## 2023-10-03 DIAGNOSIS — R278 Other lack of coordination: Secondary | ICD-10-CM | POA: Diagnosis not present

## 2023-10-03 DIAGNOSIS — R1312 Dysphagia, oropharyngeal phase: Secondary | ICD-10-CM | POA: Diagnosis not present

## 2023-10-04 DIAGNOSIS — R278 Other lack of coordination: Secondary | ICD-10-CM | POA: Diagnosis not present

## 2023-10-04 DIAGNOSIS — M62511 Muscle wasting and atrophy, not elsewhere classified, right shoulder: Secondary | ICD-10-CM | POA: Diagnosis not present

## 2023-10-04 DIAGNOSIS — G1220 Motor neuron disease, unspecified: Secondary | ICD-10-CM | POA: Diagnosis not present

## 2023-10-04 DIAGNOSIS — R2681 Unsteadiness on feet: Secondary | ICD-10-CM | POA: Diagnosis not present

## 2023-10-04 DIAGNOSIS — R1312 Dysphagia, oropharyngeal phase: Secondary | ICD-10-CM | POA: Diagnosis not present

## 2023-10-04 DIAGNOSIS — R471 Dysarthria and anarthria: Secondary | ICD-10-CM | POA: Diagnosis not present

## 2023-10-07 DIAGNOSIS — R471 Dysarthria and anarthria: Secondary | ICD-10-CM | POA: Diagnosis not present

## 2023-10-07 DIAGNOSIS — R1312 Dysphagia, oropharyngeal phase: Secondary | ICD-10-CM | POA: Diagnosis not present

## 2023-10-07 DIAGNOSIS — G1220 Motor neuron disease, unspecified: Secondary | ICD-10-CM | POA: Diagnosis not present

## 2023-10-07 DIAGNOSIS — R2681 Unsteadiness on feet: Secondary | ICD-10-CM | POA: Diagnosis not present

## 2023-10-07 DIAGNOSIS — R278 Other lack of coordination: Secondary | ICD-10-CM | POA: Diagnosis not present

## 2023-10-07 DIAGNOSIS — M62511 Muscle wasting and atrophy, not elsewhere classified, right shoulder: Secondary | ICD-10-CM | POA: Diagnosis not present

## 2023-10-08 DIAGNOSIS — R2681 Unsteadiness on feet: Secondary | ICD-10-CM | POA: Diagnosis not present

## 2023-10-08 DIAGNOSIS — R1312 Dysphagia, oropharyngeal phase: Secondary | ICD-10-CM | POA: Diagnosis not present

## 2023-10-08 DIAGNOSIS — M62511 Muscle wasting and atrophy, not elsewhere classified, right shoulder: Secondary | ICD-10-CM | POA: Diagnosis not present

## 2023-10-08 DIAGNOSIS — G1220 Motor neuron disease, unspecified: Secondary | ICD-10-CM | POA: Diagnosis not present

## 2023-10-08 DIAGNOSIS — R471 Dysarthria and anarthria: Secondary | ICD-10-CM | POA: Diagnosis not present

## 2023-10-08 DIAGNOSIS — R278 Other lack of coordination: Secondary | ICD-10-CM | POA: Diagnosis not present

## 2023-10-09 DIAGNOSIS — R471 Dysarthria and anarthria: Secondary | ICD-10-CM | POA: Diagnosis not present

## 2023-10-09 DIAGNOSIS — R278 Other lack of coordination: Secondary | ICD-10-CM | POA: Diagnosis not present

## 2023-10-09 DIAGNOSIS — R2681 Unsteadiness on feet: Secondary | ICD-10-CM | POA: Diagnosis not present

## 2023-10-09 DIAGNOSIS — G1220 Motor neuron disease, unspecified: Secondary | ICD-10-CM | POA: Diagnosis not present

## 2023-10-09 DIAGNOSIS — R1312 Dysphagia, oropharyngeal phase: Secondary | ICD-10-CM | POA: Diagnosis not present

## 2023-10-09 DIAGNOSIS — M62511 Muscle wasting and atrophy, not elsewhere classified, right shoulder: Secondary | ICD-10-CM | POA: Diagnosis not present

## 2023-10-10 DIAGNOSIS — R278 Other lack of coordination: Secondary | ICD-10-CM | POA: Diagnosis not present

## 2023-10-10 DIAGNOSIS — R471 Dysarthria and anarthria: Secondary | ICD-10-CM | POA: Diagnosis not present

## 2023-10-10 DIAGNOSIS — G1220 Motor neuron disease, unspecified: Secondary | ICD-10-CM | POA: Diagnosis not present

## 2023-10-10 DIAGNOSIS — M62511 Muscle wasting and atrophy, not elsewhere classified, right shoulder: Secondary | ICD-10-CM | POA: Diagnosis not present

## 2023-10-10 DIAGNOSIS — R1312 Dysphagia, oropharyngeal phase: Secondary | ICD-10-CM | POA: Diagnosis not present

## 2023-10-10 DIAGNOSIS — R2681 Unsteadiness on feet: Secondary | ICD-10-CM | POA: Diagnosis not present

## 2023-10-14 DIAGNOSIS — G1220 Motor neuron disease, unspecified: Secondary | ICD-10-CM | POA: Diagnosis not present

## 2023-10-14 DIAGNOSIS — R471 Dysarthria and anarthria: Secondary | ICD-10-CM | POA: Diagnosis not present

## 2023-10-14 DIAGNOSIS — R278 Other lack of coordination: Secondary | ICD-10-CM | POA: Diagnosis not present

## 2023-10-14 DIAGNOSIS — M62511 Muscle wasting and atrophy, not elsewhere classified, right shoulder: Secondary | ICD-10-CM | POA: Diagnosis not present

## 2023-10-14 DIAGNOSIS — R1312 Dysphagia, oropharyngeal phase: Secondary | ICD-10-CM | POA: Diagnosis not present

## 2023-10-14 DIAGNOSIS — R2681 Unsteadiness on feet: Secondary | ICD-10-CM | POA: Diagnosis not present

## 2023-10-15 DIAGNOSIS — G1220 Motor neuron disease, unspecified: Secondary | ICD-10-CM | POA: Diagnosis not present

## 2023-10-15 DIAGNOSIS — M62511 Muscle wasting and atrophy, not elsewhere classified, right shoulder: Secondary | ICD-10-CM | POA: Diagnosis not present

## 2023-10-15 DIAGNOSIS — R278 Other lack of coordination: Secondary | ICD-10-CM | POA: Diagnosis not present

## 2023-10-15 DIAGNOSIS — R2681 Unsteadiness on feet: Secondary | ICD-10-CM | POA: Diagnosis not present

## 2023-10-15 DIAGNOSIS — R471 Dysarthria and anarthria: Secondary | ICD-10-CM | POA: Diagnosis not present

## 2023-10-15 DIAGNOSIS — R1312 Dysphagia, oropharyngeal phase: Secondary | ICD-10-CM | POA: Diagnosis not present

## 2023-10-17 DIAGNOSIS — Z23 Encounter for immunization: Secondary | ICD-10-CM | POA: Diagnosis not present

## 2023-10-17 DIAGNOSIS — R2681 Unsteadiness on feet: Secondary | ICD-10-CM | POA: Diagnosis not present

## 2023-10-17 DIAGNOSIS — R471 Dysarthria and anarthria: Secondary | ICD-10-CM | POA: Diagnosis not present

## 2023-10-17 DIAGNOSIS — R278 Other lack of coordination: Secondary | ICD-10-CM | POA: Diagnosis not present

## 2023-10-17 DIAGNOSIS — M62511 Muscle wasting and atrophy, not elsewhere classified, right shoulder: Secondary | ICD-10-CM | POA: Diagnosis not present

## 2023-10-17 DIAGNOSIS — G1220 Motor neuron disease, unspecified: Secondary | ICD-10-CM | POA: Diagnosis not present

## 2023-10-17 DIAGNOSIS — R1312 Dysphagia, oropharyngeal phase: Secondary | ICD-10-CM | POA: Diagnosis not present

## 2023-10-18 DIAGNOSIS — R278 Other lack of coordination: Secondary | ICD-10-CM | POA: Diagnosis not present

## 2023-10-18 DIAGNOSIS — G1220 Motor neuron disease, unspecified: Secondary | ICD-10-CM | POA: Diagnosis not present

## 2023-10-18 DIAGNOSIS — R2681 Unsteadiness on feet: Secondary | ICD-10-CM | POA: Diagnosis not present

## 2023-10-18 DIAGNOSIS — R1312 Dysphagia, oropharyngeal phase: Secondary | ICD-10-CM | POA: Diagnosis not present

## 2023-10-18 DIAGNOSIS — M62511 Muscle wasting and atrophy, not elsewhere classified, right shoulder: Secondary | ICD-10-CM | POA: Diagnosis not present

## 2023-10-18 DIAGNOSIS — R471 Dysarthria and anarthria: Secondary | ICD-10-CM | POA: Diagnosis not present

## 2023-10-21 DIAGNOSIS — M62511 Muscle wasting and atrophy, not elsewhere classified, right shoulder: Secondary | ICD-10-CM | POA: Diagnosis not present

## 2023-10-21 DIAGNOSIS — R1312 Dysphagia, oropharyngeal phase: Secondary | ICD-10-CM | POA: Diagnosis not present

## 2023-10-21 DIAGNOSIS — R471 Dysarthria and anarthria: Secondary | ICD-10-CM | POA: Diagnosis not present

## 2023-10-21 DIAGNOSIS — G1220 Motor neuron disease, unspecified: Secondary | ICD-10-CM | POA: Diagnosis not present

## 2023-10-21 DIAGNOSIS — R278 Other lack of coordination: Secondary | ICD-10-CM | POA: Diagnosis not present

## 2023-10-21 DIAGNOSIS — R2681 Unsteadiness on feet: Secondary | ICD-10-CM | POA: Diagnosis not present

## 2023-10-23 DIAGNOSIS — R2681 Unsteadiness on feet: Secondary | ICD-10-CM | POA: Diagnosis not present

## 2023-10-23 DIAGNOSIS — R471 Dysarthria and anarthria: Secondary | ICD-10-CM | POA: Diagnosis not present

## 2023-10-23 DIAGNOSIS — M62511 Muscle wasting and atrophy, not elsewhere classified, right shoulder: Secondary | ICD-10-CM | POA: Diagnosis not present

## 2023-10-23 DIAGNOSIS — R278 Other lack of coordination: Secondary | ICD-10-CM | POA: Diagnosis not present

## 2023-10-23 DIAGNOSIS — G1220 Motor neuron disease, unspecified: Secondary | ICD-10-CM | POA: Diagnosis not present

## 2023-10-23 DIAGNOSIS — R1312 Dysphagia, oropharyngeal phase: Secondary | ICD-10-CM | POA: Diagnosis not present

## 2023-10-24 DIAGNOSIS — R471 Dysarthria and anarthria: Secondary | ICD-10-CM | POA: Diagnosis not present

## 2023-10-24 DIAGNOSIS — M62511 Muscle wasting and atrophy, not elsewhere classified, right shoulder: Secondary | ICD-10-CM | POA: Diagnosis not present

## 2023-10-24 DIAGNOSIS — R1312 Dysphagia, oropharyngeal phase: Secondary | ICD-10-CM | POA: Diagnosis not present

## 2023-10-24 DIAGNOSIS — G1220 Motor neuron disease, unspecified: Secondary | ICD-10-CM | POA: Diagnosis not present

## 2023-10-24 DIAGNOSIS — R2681 Unsteadiness on feet: Secondary | ICD-10-CM | POA: Diagnosis not present

## 2023-10-24 DIAGNOSIS — R278 Other lack of coordination: Secondary | ICD-10-CM | POA: Diagnosis not present

## 2023-10-25 DIAGNOSIS — M6259 Muscle wasting and atrophy, not elsewhere classified, multiple sites: Secondary | ICD-10-CM | POA: Diagnosis not present

## 2023-10-25 DIAGNOSIS — R1312 Dysphagia, oropharyngeal phase: Secondary | ICD-10-CM | POA: Diagnosis not present

## 2023-10-25 DIAGNOSIS — R2681 Unsteadiness on feet: Secondary | ICD-10-CM | POA: Diagnosis not present

## 2023-10-25 DIAGNOSIS — R1310 Dysphagia, unspecified: Secondary | ICD-10-CM | POA: Diagnosis not present

## 2023-10-25 DIAGNOSIS — G1220 Motor neuron disease, unspecified: Secondary | ICD-10-CM | POA: Diagnosis not present

## 2023-10-25 DIAGNOSIS — M62511 Muscle wasting and atrophy, not elsewhere classified, right shoulder: Secondary | ICD-10-CM | POA: Diagnosis not present

## 2023-10-25 DIAGNOSIS — R2689 Other abnormalities of gait and mobility: Secondary | ICD-10-CM | POA: Diagnosis not present

## 2023-10-25 DIAGNOSIS — R471 Dysarthria and anarthria: Secondary | ICD-10-CM | POA: Diagnosis not present

## 2023-10-25 DIAGNOSIS — R278 Other lack of coordination: Secondary | ICD-10-CM | POA: Diagnosis not present

## 2023-10-28 DIAGNOSIS — R278 Other lack of coordination: Secondary | ICD-10-CM | POA: Diagnosis not present

## 2023-10-28 DIAGNOSIS — Z23 Encounter for immunization: Secondary | ICD-10-CM | POA: Diagnosis not present

## 2023-10-28 DIAGNOSIS — M62511 Muscle wasting and atrophy, not elsewhere classified, right shoulder: Secondary | ICD-10-CM | POA: Diagnosis not present

## 2023-10-28 DIAGNOSIS — K219 Gastro-esophageal reflux disease without esophagitis: Secondary | ICD-10-CM | POA: Diagnosis not present

## 2023-10-28 DIAGNOSIS — R1312 Dysphagia, oropharyngeal phase: Secondary | ICD-10-CM | POA: Diagnosis not present

## 2023-10-28 DIAGNOSIS — R2681 Unsteadiness on feet: Secondary | ICD-10-CM | POA: Diagnosis not present

## 2023-10-28 DIAGNOSIS — M85851 Other specified disorders of bone density and structure, right thigh: Secondary | ICD-10-CM | POA: Diagnosis not present

## 2023-10-28 DIAGNOSIS — N1831 Chronic kidney disease, stage 3a: Secondary | ICD-10-CM | POA: Diagnosis not present

## 2023-10-28 DIAGNOSIS — J439 Emphysema, unspecified: Secondary | ICD-10-CM | POA: Diagnosis not present

## 2023-10-28 DIAGNOSIS — R471 Dysarthria and anarthria: Secondary | ICD-10-CM | POA: Diagnosis not present

## 2023-10-28 DIAGNOSIS — E7800 Pure hypercholesterolemia, unspecified: Secondary | ICD-10-CM | POA: Diagnosis not present

## 2023-10-28 DIAGNOSIS — K8681 Exocrine pancreatic insufficiency: Secondary | ICD-10-CM | POA: Diagnosis not present

## 2023-10-28 DIAGNOSIS — G1220 Motor neuron disease, unspecified: Secondary | ICD-10-CM | POA: Diagnosis not present

## 2023-10-28 DIAGNOSIS — E559 Vitamin D deficiency, unspecified: Secondary | ICD-10-CM | POA: Diagnosis not present

## 2023-10-28 DIAGNOSIS — M21372 Foot drop, left foot: Secondary | ICD-10-CM | POA: Diagnosis not present

## 2023-10-28 DIAGNOSIS — J849 Interstitial pulmonary disease, unspecified: Secondary | ICD-10-CM | POA: Diagnosis not present

## 2023-10-28 DIAGNOSIS — I700 Atherosclerosis of aorta: Secondary | ICD-10-CM | POA: Diagnosis not present

## 2023-10-30 DIAGNOSIS — R278 Other lack of coordination: Secondary | ICD-10-CM | POA: Diagnosis not present

## 2023-10-30 DIAGNOSIS — R2681 Unsteadiness on feet: Secondary | ICD-10-CM | POA: Diagnosis not present

## 2023-10-30 DIAGNOSIS — R1312 Dysphagia, oropharyngeal phase: Secondary | ICD-10-CM | POA: Diagnosis not present

## 2023-10-30 DIAGNOSIS — R471 Dysarthria and anarthria: Secondary | ICD-10-CM | POA: Diagnosis not present

## 2023-10-30 DIAGNOSIS — M62511 Muscle wasting and atrophy, not elsewhere classified, right shoulder: Secondary | ICD-10-CM | POA: Diagnosis not present

## 2023-10-30 DIAGNOSIS — G1220 Motor neuron disease, unspecified: Secondary | ICD-10-CM | POA: Diagnosis not present

## 2023-10-31 DIAGNOSIS — G1220 Motor neuron disease, unspecified: Secondary | ICD-10-CM | POA: Diagnosis not present

## 2023-10-31 DIAGNOSIS — R2681 Unsteadiness on feet: Secondary | ICD-10-CM | POA: Diagnosis not present

## 2023-10-31 DIAGNOSIS — R471 Dysarthria and anarthria: Secondary | ICD-10-CM | POA: Diagnosis not present

## 2023-10-31 DIAGNOSIS — M62511 Muscle wasting and atrophy, not elsewhere classified, right shoulder: Secondary | ICD-10-CM | POA: Diagnosis not present

## 2023-10-31 DIAGNOSIS — R278 Other lack of coordination: Secondary | ICD-10-CM | POA: Diagnosis not present

## 2023-10-31 DIAGNOSIS — R1312 Dysphagia, oropharyngeal phase: Secondary | ICD-10-CM | POA: Diagnosis not present

## 2023-11-01 DIAGNOSIS — R2681 Unsteadiness on feet: Secondary | ICD-10-CM | POA: Diagnosis not present

## 2023-11-01 DIAGNOSIS — R471 Dysarthria and anarthria: Secondary | ICD-10-CM | POA: Diagnosis not present

## 2023-11-01 DIAGNOSIS — G1220 Motor neuron disease, unspecified: Secondary | ICD-10-CM | POA: Diagnosis not present

## 2023-11-01 DIAGNOSIS — M62511 Muscle wasting and atrophy, not elsewhere classified, right shoulder: Secondary | ICD-10-CM | POA: Diagnosis not present

## 2023-11-01 DIAGNOSIS — R1312 Dysphagia, oropharyngeal phase: Secondary | ICD-10-CM | POA: Diagnosis not present

## 2023-11-01 DIAGNOSIS — R278 Other lack of coordination: Secondary | ICD-10-CM | POA: Diagnosis not present

## 2023-11-04 DIAGNOSIS — G1220 Motor neuron disease, unspecified: Secondary | ICD-10-CM | POA: Diagnosis not present

## 2023-11-04 DIAGNOSIS — E785 Hyperlipidemia, unspecified: Secondary | ICD-10-CM | POA: Diagnosis not present

## 2023-11-04 DIAGNOSIS — K219 Gastro-esophageal reflux disease without esophagitis: Secondary | ICD-10-CM | POA: Diagnosis not present

## 2023-11-04 DIAGNOSIS — J84115 Respiratory bronchiolitis interstitial lung disease: Secondary | ICD-10-CM | POA: Diagnosis not present

## 2023-11-04 DIAGNOSIS — G1221 Amyotrophic lateral sclerosis: Secondary | ICD-10-CM | POA: Diagnosis not present

## 2023-11-04 DIAGNOSIS — N189 Chronic kidney disease, unspecified: Secondary | ICD-10-CM | POA: Diagnosis not present

## 2023-11-05 DIAGNOSIS — E785 Hyperlipidemia, unspecified: Secondary | ICD-10-CM | POA: Diagnosis not present

## 2023-11-05 DIAGNOSIS — K219 Gastro-esophageal reflux disease without esophagitis: Secondary | ICD-10-CM | POA: Diagnosis not present

## 2023-11-05 DIAGNOSIS — G1221 Amyotrophic lateral sclerosis: Secondary | ICD-10-CM | POA: Diagnosis not present

## 2023-11-05 DIAGNOSIS — J84115 Respiratory bronchiolitis interstitial lung disease: Secondary | ICD-10-CM | POA: Diagnosis not present

## 2023-11-05 DIAGNOSIS — N189 Chronic kidney disease, unspecified: Secondary | ICD-10-CM | POA: Diagnosis not present

## 2023-11-05 DIAGNOSIS — G1220 Motor neuron disease, unspecified: Secondary | ICD-10-CM | POA: Diagnosis not present

## 2023-11-06 DIAGNOSIS — N189 Chronic kidney disease, unspecified: Secondary | ICD-10-CM | POA: Diagnosis not present

## 2023-11-06 DIAGNOSIS — E785 Hyperlipidemia, unspecified: Secondary | ICD-10-CM | POA: Diagnosis not present

## 2023-11-06 DIAGNOSIS — J84115 Respiratory bronchiolitis interstitial lung disease: Secondary | ICD-10-CM | POA: Diagnosis not present

## 2023-11-06 DIAGNOSIS — K219 Gastro-esophageal reflux disease without esophagitis: Secondary | ICD-10-CM | POA: Diagnosis not present

## 2023-11-06 DIAGNOSIS — G1221 Amyotrophic lateral sclerosis: Secondary | ICD-10-CM | POA: Diagnosis not present

## 2023-11-06 DIAGNOSIS — G1220 Motor neuron disease, unspecified: Secondary | ICD-10-CM | POA: Diagnosis not present

## 2023-11-07 DIAGNOSIS — E785 Hyperlipidemia, unspecified: Secondary | ICD-10-CM | POA: Diagnosis not present

## 2023-11-07 DIAGNOSIS — J84115 Respiratory bronchiolitis interstitial lung disease: Secondary | ICD-10-CM | POA: Diagnosis not present

## 2023-11-07 DIAGNOSIS — G1220 Motor neuron disease, unspecified: Secondary | ICD-10-CM | POA: Diagnosis not present

## 2023-11-07 DIAGNOSIS — K219 Gastro-esophageal reflux disease without esophagitis: Secondary | ICD-10-CM | POA: Diagnosis not present

## 2023-11-07 DIAGNOSIS — G1221 Amyotrophic lateral sclerosis: Secondary | ICD-10-CM | POA: Diagnosis not present

## 2023-11-07 DIAGNOSIS — N189 Chronic kidney disease, unspecified: Secondary | ICD-10-CM | POA: Diagnosis not present

## 2023-11-08 DIAGNOSIS — N189 Chronic kidney disease, unspecified: Secondary | ICD-10-CM | POA: Diagnosis not present

## 2023-11-08 DIAGNOSIS — G1221 Amyotrophic lateral sclerosis: Secondary | ICD-10-CM | POA: Diagnosis not present

## 2023-11-08 DIAGNOSIS — J84115 Respiratory bronchiolitis interstitial lung disease: Secondary | ICD-10-CM | POA: Diagnosis not present

## 2023-11-08 DIAGNOSIS — E785 Hyperlipidemia, unspecified: Secondary | ICD-10-CM | POA: Diagnosis not present

## 2023-11-08 DIAGNOSIS — K219 Gastro-esophageal reflux disease without esophagitis: Secondary | ICD-10-CM | POA: Diagnosis not present

## 2023-11-08 DIAGNOSIS — G1220 Motor neuron disease, unspecified: Secondary | ICD-10-CM | POA: Diagnosis not present

## 2023-11-14 DIAGNOSIS — G1220 Motor neuron disease, unspecified: Secondary | ICD-10-CM | POA: Diagnosis not present

## 2023-11-14 DIAGNOSIS — N189 Chronic kidney disease, unspecified: Secondary | ICD-10-CM | POA: Diagnosis not present

## 2023-11-14 DIAGNOSIS — J84115 Respiratory bronchiolitis interstitial lung disease: Secondary | ICD-10-CM | POA: Diagnosis not present

## 2023-11-14 DIAGNOSIS — E785 Hyperlipidemia, unspecified: Secondary | ICD-10-CM | POA: Diagnosis not present

## 2023-11-14 DIAGNOSIS — G1221 Amyotrophic lateral sclerosis: Secondary | ICD-10-CM | POA: Diagnosis not present

## 2023-11-14 DIAGNOSIS — K219 Gastro-esophageal reflux disease without esophagitis: Secondary | ICD-10-CM | POA: Diagnosis not present

## 2023-11-15 DIAGNOSIS — J84115 Respiratory bronchiolitis interstitial lung disease: Secondary | ICD-10-CM | POA: Diagnosis not present

## 2023-11-15 DIAGNOSIS — G1220 Motor neuron disease, unspecified: Secondary | ICD-10-CM | POA: Diagnosis not present

## 2023-11-15 DIAGNOSIS — G1221 Amyotrophic lateral sclerosis: Secondary | ICD-10-CM | POA: Diagnosis not present

## 2023-11-15 DIAGNOSIS — N189 Chronic kidney disease, unspecified: Secondary | ICD-10-CM | POA: Diagnosis not present

## 2023-11-15 DIAGNOSIS — E785 Hyperlipidemia, unspecified: Secondary | ICD-10-CM | POA: Diagnosis not present

## 2023-11-15 DIAGNOSIS — K219 Gastro-esophageal reflux disease without esophagitis: Secondary | ICD-10-CM | POA: Diagnosis not present

## 2023-11-19 DIAGNOSIS — J84115 Respiratory bronchiolitis interstitial lung disease: Secondary | ICD-10-CM | POA: Diagnosis not present

## 2023-11-19 DIAGNOSIS — E785 Hyperlipidemia, unspecified: Secondary | ICD-10-CM | POA: Diagnosis not present

## 2023-11-19 DIAGNOSIS — G1220 Motor neuron disease, unspecified: Secondary | ICD-10-CM | POA: Diagnosis not present

## 2023-11-19 DIAGNOSIS — N189 Chronic kidney disease, unspecified: Secondary | ICD-10-CM | POA: Diagnosis not present

## 2023-11-19 DIAGNOSIS — K219 Gastro-esophageal reflux disease without esophagitis: Secondary | ICD-10-CM | POA: Diagnosis not present

## 2023-11-19 DIAGNOSIS — G1221 Amyotrophic lateral sclerosis: Secondary | ICD-10-CM | POA: Diagnosis not present

## 2023-11-22 DIAGNOSIS — E785 Hyperlipidemia, unspecified: Secondary | ICD-10-CM | POA: Diagnosis not present

## 2023-11-22 DIAGNOSIS — G1221 Amyotrophic lateral sclerosis: Secondary | ICD-10-CM | POA: Diagnosis not present

## 2023-11-22 DIAGNOSIS — J84115 Respiratory bronchiolitis interstitial lung disease: Secondary | ICD-10-CM | POA: Diagnosis not present

## 2023-11-22 DIAGNOSIS — K219 Gastro-esophageal reflux disease without esophagitis: Secondary | ICD-10-CM | POA: Diagnosis not present

## 2023-11-22 DIAGNOSIS — N189 Chronic kidney disease, unspecified: Secondary | ICD-10-CM | POA: Diagnosis not present

## 2023-11-22 DIAGNOSIS — G1220 Motor neuron disease, unspecified: Secondary | ICD-10-CM | POA: Diagnosis not present

## 2023-11-24 DIAGNOSIS — G1220 Motor neuron disease, unspecified: Secondary | ICD-10-CM | POA: Diagnosis not present

## 2023-11-24 DIAGNOSIS — E785 Hyperlipidemia, unspecified: Secondary | ICD-10-CM | POA: Diagnosis not present

## 2023-11-24 DIAGNOSIS — G1221 Amyotrophic lateral sclerosis: Secondary | ICD-10-CM | POA: Diagnosis not present

## 2023-11-24 DIAGNOSIS — N189 Chronic kidney disease, unspecified: Secondary | ICD-10-CM | POA: Diagnosis not present

## 2023-11-24 DIAGNOSIS — J84115 Respiratory bronchiolitis interstitial lung disease: Secondary | ICD-10-CM | POA: Diagnosis not present

## 2023-11-24 DIAGNOSIS — K219 Gastro-esophageal reflux disease without esophagitis: Secondary | ICD-10-CM | POA: Diagnosis not present

## 2023-11-26 DIAGNOSIS — G1221 Amyotrophic lateral sclerosis: Secondary | ICD-10-CM | POA: Diagnosis not present

## 2023-11-26 DIAGNOSIS — E785 Hyperlipidemia, unspecified: Secondary | ICD-10-CM | POA: Diagnosis not present

## 2023-11-26 DIAGNOSIS — K219 Gastro-esophageal reflux disease without esophagitis: Secondary | ICD-10-CM | POA: Diagnosis not present

## 2023-11-26 DIAGNOSIS — J84115 Respiratory bronchiolitis interstitial lung disease: Secondary | ICD-10-CM | POA: Diagnosis not present

## 2023-11-26 DIAGNOSIS — G1220 Motor neuron disease, unspecified: Secondary | ICD-10-CM | POA: Diagnosis not present

## 2023-11-26 DIAGNOSIS — N189 Chronic kidney disease, unspecified: Secondary | ICD-10-CM | POA: Diagnosis not present

## 2023-11-28 DIAGNOSIS — G1220 Motor neuron disease, unspecified: Secondary | ICD-10-CM | POA: Diagnosis not present

## 2023-11-28 DIAGNOSIS — N189 Chronic kidney disease, unspecified: Secondary | ICD-10-CM | POA: Diagnosis not present

## 2023-11-28 DIAGNOSIS — J84115 Respiratory bronchiolitis interstitial lung disease: Secondary | ICD-10-CM | POA: Diagnosis not present

## 2023-11-28 DIAGNOSIS — K219 Gastro-esophageal reflux disease without esophagitis: Secondary | ICD-10-CM | POA: Diagnosis not present

## 2023-11-28 DIAGNOSIS — G1221 Amyotrophic lateral sclerosis: Secondary | ICD-10-CM | POA: Diagnosis not present

## 2023-11-28 DIAGNOSIS — E785 Hyperlipidemia, unspecified: Secondary | ICD-10-CM | POA: Diagnosis not present

## 2023-11-29 DIAGNOSIS — K219 Gastro-esophageal reflux disease without esophagitis: Secondary | ICD-10-CM | POA: Diagnosis not present

## 2023-11-29 DIAGNOSIS — N189 Chronic kidney disease, unspecified: Secondary | ICD-10-CM | POA: Diagnosis not present

## 2023-11-29 DIAGNOSIS — J84115 Respiratory bronchiolitis interstitial lung disease: Secondary | ICD-10-CM | POA: Diagnosis not present

## 2023-11-29 DIAGNOSIS — G1221 Amyotrophic lateral sclerosis: Secondary | ICD-10-CM | POA: Diagnosis not present

## 2023-11-29 DIAGNOSIS — E785 Hyperlipidemia, unspecified: Secondary | ICD-10-CM | POA: Diagnosis not present

## 2023-11-29 DIAGNOSIS — G1220 Motor neuron disease, unspecified: Secondary | ICD-10-CM | POA: Diagnosis not present

## 2023-12-03 DIAGNOSIS — N189 Chronic kidney disease, unspecified: Secondary | ICD-10-CM | POA: Diagnosis not present

## 2023-12-03 DIAGNOSIS — K219 Gastro-esophageal reflux disease without esophagitis: Secondary | ICD-10-CM | POA: Diagnosis not present

## 2023-12-03 DIAGNOSIS — G1220 Motor neuron disease, unspecified: Secondary | ICD-10-CM | POA: Diagnosis not present

## 2023-12-03 DIAGNOSIS — J84115 Respiratory bronchiolitis interstitial lung disease: Secondary | ICD-10-CM | POA: Diagnosis not present

## 2023-12-03 DIAGNOSIS — E785 Hyperlipidemia, unspecified: Secondary | ICD-10-CM | POA: Diagnosis not present

## 2023-12-03 DIAGNOSIS — G1221 Amyotrophic lateral sclerosis: Secondary | ICD-10-CM | POA: Diagnosis not present

## 2023-12-05 DIAGNOSIS — G1220 Motor neuron disease, unspecified: Secondary | ICD-10-CM | POA: Diagnosis not present

## 2023-12-05 DIAGNOSIS — K219 Gastro-esophageal reflux disease without esophagitis: Secondary | ICD-10-CM | POA: Diagnosis not present

## 2023-12-05 DIAGNOSIS — J84115 Respiratory bronchiolitis interstitial lung disease: Secondary | ICD-10-CM | POA: Diagnosis not present

## 2023-12-05 DIAGNOSIS — G1221 Amyotrophic lateral sclerosis: Secondary | ICD-10-CM | POA: Diagnosis not present

## 2023-12-05 DIAGNOSIS — N189 Chronic kidney disease, unspecified: Secondary | ICD-10-CM | POA: Diagnosis not present

## 2023-12-05 DIAGNOSIS — E785 Hyperlipidemia, unspecified: Secondary | ICD-10-CM | POA: Diagnosis not present

## 2023-12-09 DIAGNOSIS — N189 Chronic kidney disease, unspecified: Secondary | ICD-10-CM | POA: Diagnosis not present

## 2023-12-09 DIAGNOSIS — E785 Hyperlipidemia, unspecified: Secondary | ICD-10-CM | POA: Diagnosis not present

## 2023-12-09 DIAGNOSIS — K219 Gastro-esophageal reflux disease without esophagitis: Secondary | ICD-10-CM | POA: Diagnosis not present

## 2023-12-09 DIAGNOSIS — J84115 Respiratory bronchiolitis interstitial lung disease: Secondary | ICD-10-CM | POA: Diagnosis not present

## 2023-12-09 DIAGNOSIS — G1220 Motor neuron disease, unspecified: Secondary | ICD-10-CM | POA: Diagnosis not present

## 2023-12-09 DIAGNOSIS — G1221 Amyotrophic lateral sclerosis: Secondary | ICD-10-CM | POA: Diagnosis not present

## 2023-12-10 DIAGNOSIS — J84115 Respiratory bronchiolitis interstitial lung disease: Secondary | ICD-10-CM | POA: Diagnosis not present

## 2023-12-10 DIAGNOSIS — G1221 Amyotrophic lateral sclerosis: Secondary | ICD-10-CM | POA: Diagnosis not present

## 2023-12-10 DIAGNOSIS — E785 Hyperlipidemia, unspecified: Secondary | ICD-10-CM | POA: Diagnosis not present

## 2023-12-10 DIAGNOSIS — G1220 Motor neuron disease, unspecified: Secondary | ICD-10-CM | POA: Diagnosis not present

## 2023-12-10 DIAGNOSIS — K219 Gastro-esophageal reflux disease without esophagitis: Secondary | ICD-10-CM | POA: Diagnosis not present

## 2023-12-10 DIAGNOSIS — N189 Chronic kidney disease, unspecified: Secondary | ICD-10-CM | POA: Diagnosis not present

## 2023-12-12 DIAGNOSIS — G1220 Motor neuron disease, unspecified: Secondary | ICD-10-CM | POA: Diagnosis not present

## 2023-12-12 DIAGNOSIS — J84115 Respiratory bronchiolitis interstitial lung disease: Secondary | ICD-10-CM | POA: Diagnosis not present

## 2023-12-12 DIAGNOSIS — N189 Chronic kidney disease, unspecified: Secondary | ICD-10-CM | POA: Diagnosis not present

## 2023-12-12 DIAGNOSIS — K219 Gastro-esophageal reflux disease without esophagitis: Secondary | ICD-10-CM | POA: Diagnosis not present

## 2023-12-12 DIAGNOSIS — G1221 Amyotrophic lateral sclerosis: Secondary | ICD-10-CM | POA: Diagnosis not present

## 2023-12-12 DIAGNOSIS — E785 Hyperlipidemia, unspecified: Secondary | ICD-10-CM | POA: Diagnosis not present

## 2023-12-13 DIAGNOSIS — G1221 Amyotrophic lateral sclerosis: Secondary | ICD-10-CM | POA: Diagnosis not present

## 2023-12-13 DIAGNOSIS — J84115 Respiratory bronchiolitis interstitial lung disease: Secondary | ICD-10-CM | POA: Diagnosis not present

## 2023-12-13 DIAGNOSIS — G1220 Motor neuron disease, unspecified: Secondary | ICD-10-CM | POA: Diagnosis not present

## 2023-12-13 DIAGNOSIS — E785 Hyperlipidemia, unspecified: Secondary | ICD-10-CM | POA: Diagnosis not present

## 2023-12-13 DIAGNOSIS — K219 Gastro-esophageal reflux disease without esophagitis: Secondary | ICD-10-CM | POA: Diagnosis not present

## 2023-12-13 DIAGNOSIS — N189 Chronic kidney disease, unspecified: Secondary | ICD-10-CM | POA: Diagnosis not present

## 2023-12-17 DIAGNOSIS — K219 Gastro-esophageal reflux disease without esophagitis: Secondary | ICD-10-CM | POA: Diagnosis not present

## 2023-12-17 DIAGNOSIS — G1220 Motor neuron disease, unspecified: Secondary | ICD-10-CM | POA: Diagnosis not present

## 2023-12-17 DIAGNOSIS — G1221 Amyotrophic lateral sclerosis: Secondary | ICD-10-CM | POA: Diagnosis not present

## 2023-12-17 DIAGNOSIS — E785 Hyperlipidemia, unspecified: Secondary | ICD-10-CM | POA: Diagnosis not present

## 2023-12-17 DIAGNOSIS — N189 Chronic kidney disease, unspecified: Secondary | ICD-10-CM | POA: Diagnosis not present

## 2023-12-17 DIAGNOSIS — J84115 Respiratory bronchiolitis interstitial lung disease: Secondary | ICD-10-CM | POA: Diagnosis not present

## 2023-12-19 DIAGNOSIS — G1220 Motor neuron disease, unspecified: Secondary | ICD-10-CM | POA: Diagnosis not present

## 2023-12-19 DIAGNOSIS — G1221 Amyotrophic lateral sclerosis: Secondary | ICD-10-CM | POA: Diagnosis not present

## 2023-12-19 DIAGNOSIS — N189 Chronic kidney disease, unspecified: Secondary | ICD-10-CM | POA: Diagnosis not present

## 2023-12-19 DIAGNOSIS — J84115 Respiratory bronchiolitis interstitial lung disease: Secondary | ICD-10-CM | POA: Diagnosis not present

## 2023-12-19 DIAGNOSIS — E785 Hyperlipidemia, unspecified: Secondary | ICD-10-CM | POA: Diagnosis not present

## 2023-12-19 DIAGNOSIS — K219 Gastro-esophageal reflux disease without esophagitis: Secondary | ICD-10-CM | POA: Diagnosis not present

## 2023-12-20 DIAGNOSIS — N189 Chronic kidney disease, unspecified: Secondary | ICD-10-CM | POA: Diagnosis not present

## 2023-12-20 DIAGNOSIS — G1221 Amyotrophic lateral sclerosis: Secondary | ICD-10-CM | POA: Diagnosis not present

## 2023-12-20 DIAGNOSIS — E785 Hyperlipidemia, unspecified: Secondary | ICD-10-CM | POA: Diagnosis not present

## 2023-12-20 DIAGNOSIS — K219 Gastro-esophageal reflux disease without esophagitis: Secondary | ICD-10-CM | POA: Diagnosis not present

## 2023-12-20 DIAGNOSIS — G1220 Motor neuron disease, unspecified: Secondary | ICD-10-CM | POA: Diagnosis not present

## 2023-12-20 DIAGNOSIS — J84115 Respiratory bronchiolitis interstitial lung disease: Secondary | ICD-10-CM | POA: Diagnosis not present

## 2023-12-24 DIAGNOSIS — K219 Gastro-esophageal reflux disease without esophagitis: Secondary | ICD-10-CM | POA: Diagnosis not present

## 2023-12-24 DIAGNOSIS — J84115 Respiratory bronchiolitis interstitial lung disease: Secondary | ICD-10-CM | POA: Diagnosis not present

## 2023-12-24 DIAGNOSIS — G1221 Amyotrophic lateral sclerosis: Secondary | ICD-10-CM | POA: Diagnosis not present

## 2023-12-24 DIAGNOSIS — E785 Hyperlipidemia, unspecified: Secondary | ICD-10-CM | POA: Diagnosis not present

## 2023-12-24 DIAGNOSIS — N189 Chronic kidney disease, unspecified: Secondary | ICD-10-CM | POA: Diagnosis not present

## 2023-12-24 DIAGNOSIS — G1220 Motor neuron disease, unspecified: Secondary | ICD-10-CM | POA: Diagnosis not present

## 2023-12-25 DIAGNOSIS — N189 Chronic kidney disease, unspecified: Secondary | ICD-10-CM | POA: Diagnosis not present

## 2023-12-25 DIAGNOSIS — E785 Hyperlipidemia, unspecified: Secondary | ICD-10-CM | POA: Diagnosis not present

## 2023-12-25 DIAGNOSIS — K219 Gastro-esophageal reflux disease without esophagitis: Secondary | ICD-10-CM | POA: Diagnosis not present

## 2023-12-25 DIAGNOSIS — G1221 Amyotrophic lateral sclerosis: Secondary | ICD-10-CM | POA: Diagnosis not present

## 2023-12-25 DIAGNOSIS — J84115 Respiratory bronchiolitis interstitial lung disease: Secondary | ICD-10-CM | POA: Diagnosis not present

## 2023-12-25 DIAGNOSIS — G1220 Motor neuron disease, unspecified: Secondary | ICD-10-CM | POA: Diagnosis not present

## 2023-12-26 DIAGNOSIS — E785 Hyperlipidemia, unspecified: Secondary | ICD-10-CM | POA: Diagnosis not present

## 2023-12-26 DIAGNOSIS — G1220 Motor neuron disease, unspecified: Secondary | ICD-10-CM | POA: Diagnosis not present

## 2023-12-26 DIAGNOSIS — G1221 Amyotrophic lateral sclerosis: Secondary | ICD-10-CM | POA: Diagnosis not present

## 2023-12-26 DIAGNOSIS — J84115 Respiratory bronchiolitis interstitial lung disease: Secondary | ICD-10-CM | POA: Diagnosis not present

## 2023-12-26 DIAGNOSIS — N189 Chronic kidney disease, unspecified: Secondary | ICD-10-CM | POA: Diagnosis not present

## 2023-12-26 DIAGNOSIS — K219 Gastro-esophageal reflux disease without esophagitis: Secondary | ICD-10-CM | POA: Diagnosis not present

## 2023-12-27 DIAGNOSIS — N189 Chronic kidney disease, unspecified: Secondary | ICD-10-CM | POA: Diagnosis not present

## 2023-12-27 DIAGNOSIS — G1221 Amyotrophic lateral sclerosis: Secondary | ICD-10-CM | POA: Diagnosis not present

## 2023-12-27 DIAGNOSIS — E785 Hyperlipidemia, unspecified: Secondary | ICD-10-CM | POA: Diagnosis not present

## 2023-12-27 DIAGNOSIS — K219 Gastro-esophageal reflux disease without esophagitis: Secondary | ICD-10-CM | POA: Diagnosis not present

## 2023-12-27 DIAGNOSIS — J84115 Respiratory bronchiolitis interstitial lung disease: Secondary | ICD-10-CM | POA: Diagnosis not present

## 2023-12-27 DIAGNOSIS — G1220 Motor neuron disease, unspecified: Secondary | ICD-10-CM | POA: Diagnosis not present

## 2023-12-31 DIAGNOSIS — N189 Chronic kidney disease, unspecified: Secondary | ICD-10-CM | POA: Diagnosis not present

## 2023-12-31 DIAGNOSIS — G1220 Motor neuron disease, unspecified: Secondary | ICD-10-CM | POA: Diagnosis not present

## 2023-12-31 DIAGNOSIS — K219 Gastro-esophageal reflux disease without esophagitis: Secondary | ICD-10-CM | POA: Diagnosis not present

## 2023-12-31 DIAGNOSIS — E785 Hyperlipidemia, unspecified: Secondary | ICD-10-CM | POA: Diagnosis not present

## 2023-12-31 DIAGNOSIS — G1221 Amyotrophic lateral sclerosis: Secondary | ICD-10-CM | POA: Diagnosis not present

## 2023-12-31 DIAGNOSIS — J84115 Respiratory bronchiolitis interstitial lung disease: Secondary | ICD-10-CM | POA: Diagnosis not present

## 2024-01-01 DIAGNOSIS — G1220 Motor neuron disease, unspecified: Secondary | ICD-10-CM | POA: Diagnosis not present

## 2024-01-01 DIAGNOSIS — G1221 Amyotrophic lateral sclerosis: Secondary | ICD-10-CM | POA: Diagnosis not present

## 2024-01-01 DIAGNOSIS — K219 Gastro-esophageal reflux disease without esophagitis: Secondary | ICD-10-CM | POA: Diagnosis not present

## 2024-01-01 DIAGNOSIS — J84115 Respiratory bronchiolitis interstitial lung disease: Secondary | ICD-10-CM | POA: Diagnosis not present

## 2024-01-01 DIAGNOSIS — N189 Chronic kidney disease, unspecified: Secondary | ICD-10-CM | POA: Diagnosis not present

## 2024-01-01 DIAGNOSIS — E785 Hyperlipidemia, unspecified: Secondary | ICD-10-CM | POA: Diagnosis not present

## 2024-01-02 DIAGNOSIS — G1220 Motor neuron disease, unspecified: Secondary | ICD-10-CM | POA: Diagnosis not present

## 2024-01-02 DIAGNOSIS — N189 Chronic kidney disease, unspecified: Secondary | ICD-10-CM | POA: Diagnosis not present

## 2024-01-02 DIAGNOSIS — J84115 Respiratory bronchiolitis interstitial lung disease: Secondary | ICD-10-CM | POA: Diagnosis not present

## 2024-01-02 DIAGNOSIS — E785 Hyperlipidemia, unspecified: Secondary | ICD-10-CM | POA: Diagnosis not present

## 2024-01-02 DIAGNOSIS — G1221 Amyotrophic lateral sclerosis: Secondary | ICD-10-CM | POA: Diagnosis not present

## 2024-01-02 DIAGNOSIS — K219 Gastro-esophageal reflux disease without esophagitis: Secondary | ICD-10-CM | POA: Diagnosis not present

## 2024-01-03 DIAGNOSIS — E785 Hyperlipidemia, unspecified: Secondary | ICD-10-CM | POA: Diagnosis not present

## 2024-01-03 DIAGNOSIS — K219 Gastro-esophageal reflux disease without esophagitis: Secondary | ICD-10-CM | POA: Diagnosis not present

## 2024-01-03 DIAGNOSIS — G1221 Amyotrophic lateral sclerosis: Secondary | ICD-10-CM | POA: Diagnosis not present

## 2024-01-03 DIAGNOSIS — G1220 Motor neuron disease, unspecified: Secondary | ICD-10-CM | POA: Diagnosis not present

## 2024-01-03 DIAGNOSIS — J84115 Respiratory bronchiolitis interstitial lung disease: Secondary | ICD-10-CM | POA: Diagnosis not present

## 2024-01-03 DIAGNOSIS — N189 Chronic kidney disease, unspecified: Secondary | ICD-10-CM | POA: Diagnosis not present

## 2024-01-06 DIAGNOSIS — N189 Chronic kidney disease, unspecified: Secondary | ICD-10-CM | POA: Diagnosis not present

## 2024-01-06 DIAGNOSIS — K219 Gastro-esophageal reflux disease without esophagitis: Secondary | ICD-10-CM | POA: Diagnosis not present

## 2024-01-06 DIAGNOSIS — G1220 Motor neuron disease, unspecified: Secondary | ICD-10-CM | POA: Diagnosis not present

## 2024-01-06 DIAGNOSIS — J84115 Respiratory bronchiolitis interstitial lung disease: Secondary | ICD-10-CM | POA: Diagnosis not present

## 2024-01-06 DIAGNOSIS — G1221 Amyotrophic lateral sclerosis: Secondary | ICD-10-CM | POA: Diagnosis not present

## 2024-01-06 DIAGNOSIS — E785 Hyperlipidemia, unspecified: Secondary | ICD-10-CM | POA: Diagnosis not present

## 2024-01-07 DIAGNOSIS — G1221 Amyotrophic lateral sclerosis: Secondary | ICD-10-CM | POA: Diagnosis not present

## 2024-01-07 DIAGNOSIS — N189 Chronic kidney disease, unspecified: Secondary | ICD-10-CM | POA: Diagnosis not present

## 2024-01-07 DIAGNOSIS — K219 Gastro-esophageal reflux disease without esophagitis: Secondary | ICD-10-CM | POA: Diagnosis not present

## 2024-01-07 DIAGNOSIS — J84115 Respiratory bronchiolitis interstitial lung disease: Secondary | ICD-10-CM | POA: Diagnosis not present

## 2024-01-07 DIAGNOSIS — E785 Hyperlipidemia, unspecified: Secondary | ICD-10-CM | POA: Diagnosis not present

## 2024-01-07 DIAGNOSIS — G1220 Motor neuron disease, unspecified: Secondary | ICD-10-CM | POA: Diagnosis not present

## 2024-01-09 DIAGNOSIS — K219 Gastro-esophageal reflux disease without esophagitis: Secondary | ICD-10-CM | POA: Diagnosis not present

## 2024-01-09 DIAGNOSIS — N189 Chronic kidney disease, unspecified: Secondary | ICD-10-CM | POA: Diagnosis not present

## 2024-01-09 DIAGNOSIS — E785 Hyperlipidemia, unspecified: Secondary | ICD-10-CM | POA: Diagnosis not present

## 2024-01-09 DIAGNOSIS — G1221 Amyotrophic lateral sclerosis: Secondary | ICD-10-CM | POA: Diagnosis not present

## 2024-01-09 DIAGNOSIS — J84115 Respiratory bronchiolitis interstitial lung disease: Secondary | ICD-10-CM | POA: Diagnosis not present

## 2024-01-09 DIAGNOSIS — G1220 Motor neuron disease, unspecified: Secondary | ICD-10-CM | POA: Diagnosis not present

## 2024-01-10 DIAGNOSIS — N189 Chronic kidney disease, unspecified: Secondary | ICD-10-CM | POA: Diagnosis not present

## 2024-01-10 DIAGNOSIS — G1220 Motor neuron disease, unspecified: Secondary | ICD-10-CM | POA: Diagnosis not present

## 2024-01-10 DIAGNOSIS — E785 Hyperlipidemia, unspecified: Secondary | ICD-10-CM | POA: Diagnosis not present

## 2024-01-10 DIAGNOSIS — G1221 Amyotrophic lateral sclerosis: Secondary | ICD-10-CM | POA: Diagnosis not present

## 2024-01-10 DIAGNOSIS — J84115 Respiratory bronchiolitis interstitial lung disease: Secondary | ICD-10-CM | POA: Diagnosis not present

## 2024-01-10 DIAGNOSIS — K219 Gastro-esophageal reflux disease without esophagitis: Secondary | ICD-10-CM | POA: Diagnosis not present

## 2024-01-12 DIAGNOSIS — K219 Gastro-esophageal reflux disease without esophagitis: Secondary | ICD-10-CM | POA: Diagnosis not present

## 2024-01-12 DIAGNOSIS — G1220 Motor neuron disease, unspecified: Secondary | ICD-10-CM | POA: Diagnosis not present

## 2024-01-12 DIAGNOSIS — G1221 Amyotrophic lateral sclerosis: Secondary | ICD-10-CM | POA: Diagnosis not present

## 2024-01-12 DIAGNOSIS — E785 Hyperlipidemia, unspecified: Secondary | ICD-10-CM | POA: Diagnosis not present

## 2024-01-12 DIAGNOSIS — R531 Weakness: Secondary | ICD-10-CM | POA: Diagnosis not present

## 2024-01-12 DIAGNOSIS — N189 Chronic kidney disease, unspecified: Secondary | ICD-10-CM | POA: Diagnosis not present

## 2024-01-12 DIAGNOSIS — Z66 Do not resuscitate: Secondary | ICD-10-CM | POA: Diagnosis not present

## 2024-01-12 DIAGNOSIS — J84115 Respiratory bronchiolitis interstitial lung disease: Secondary | ICD-10-CM | POA: Diagnosis not present

## 2024-01-13 DIAGNOSIS — R531 Weakness: Secondary | ICD-10-CM | POA: Diagnosis not present

## 2024-01-13 DIAGNOSIS — K219 Gastro-esophageal reflux disease without esophagitis: Secondary | ICD-10-CM | POA: Diagnosis not present

## 2024-01-13 DIAGNOSIS — E785 Hyperlipidemia, unspecified: Secondary | ICD-10-CM | POA: Diagnosis not present

## 2024-01-13 DIAGNOSIS — G1220 Motor neuron disease, unspecified: Secondary | ICD-10-CM | POA: Diagnosis not present

## 2024-01-13 DIAGNOSIS — N189 Chronic kidney disease, unspecified: Secondary | ICD-10-CM | POA: Diagnosis not present

## 2024-01-13 DIAGNOSIS — G1221 Amyotrophic lateral sclerosis: Secondary | ICD-10-CM | POA: Diagnosis not present

## 2024-01-15 DIAGNOSIS — N189 Chronic kidney disease, unspecified: Secondary | ICD-10-CM | POA: Diagnosis not present

## 2024-01-15 DIAGNOSIS — G1221 Amyotrophic lateral sclerosis: Secondary | ICD-10-CM | POA: Diagnosis not present

## 2024-01-15 DIAGNOSIS — K219 Gastro-esophageal reflux disease without esophagitis: Secondary | ICD-10-CM | POA: Diagnosis not present

## 2024-01-15 DIAGNOSIS — E785 Hyperlipidemia, unspecified: Secondary | ICD-10-CM | POA: Diagnosis not present

## 2024-01-15 DIAGNOSIS — R531 Weakness: Secondary | ICD-10-CM | POA: Diagnosis not present

## 2024-01-15 DIAGNOSIS — G1220 Motor neuron disease, unspecified: Secondary | ICD-10-CM | POA: Diagnosis not present

## 2024-01-16 DIAGNOSIS — R531 Weakness: Secondary | ICD-10-CM | POA: Diagnosis not present

## 2024-01-16 DIAGNOSIS — E785 Hyperlipidemia, unspecified: Secondary | ICD-10-CM | POA: Diagnosis not present

## 2024-01-16 DIAGNOSIS — N189 Chronic kidney disease, unspecified: Secondary | ICD-10-CM | POA: Diagnosis not present

## 2024-01-16 DIAGNOSIS — G1221 Amyotrophic lateral sclerosis: Secondary | ICD-10-CM | POA: Diagnosis not present

## 2024-01-16 DIAGNOSIS — K219 Gastro-esophageal reflux disease without esophagitis: Secondary | ICD-10-CM | POA: Diagnosis not present

## 2024-01-16 DIAGNOSIS — G1220 Motor neuron disease, unspecified: Secondary | ICD-10-CM | POA: Diagnosis not present

## 2024-01-20 DIAGNOSIS — G1221 Amyotrophic lateral sclerosis: Secondary | ICD-10-CM | POA: Diagnosis not present

## 2024-01-20 DIAGNOSIS — R531 Weakness: Secondary | ICD-10-CM | POA: Diagnosis not present

## 2024-01-20 DIAGNOSIS — E785 Hyperlipidemia, unspecified: Secondary | ICD-10-CM | POA: Diagnosis not present

## 2024-01-20 DIAGNOSIS — G1220 Motor neuron disease, unspecified: Secondary | ICD-10-CM | POA: Diagnosis not present

## 2024-01-20 DIAGNOSIS — N189 Chronic kidney disease, unspecified: Secondary | ICD-10-CM | POA: Diagnosis not present

## 2024-01-20 DIAGNOSIS — K219 Gastro-esophageal reflux disease without esophagitis: Secondary | ICD-10-CM | POA: Diagnosis not present

## 2024-01-21 DIAGNOSIS — G1220 Motor neuron disease, unspecified: Secondary | ICD-10-CM | POA: Diagnosis not present

## 2024-01-21 DIAGNOSIS — N189 Chronic kidney disease, unspecified: Secondary | ICD-10-CM | POA: Diagnosis not present

## 2024-01-21 DIAGNOSIS — R531 Weakness: Secondary | ICD-10-CM | POA: Diagnosis not present

## 2024-01-21 DIAGNOSIS — K219 Gastro-esophageal reflux disease without esophagitis: Secondary | ICD-10-CM | POA: Diagnosis not present

## 2024-01-21 DIAGNOSIS — G1221 Amyotrophic lateral sclerosis: Secondary | ICD-10-CM | POA: Diagnosis not present

## 2024-01-21 DIAGNOSIS — E785 Hyperlipidemia, unspecified: Secondary | ICD-10-CM | POA: Diagnosis not present

## 2024-01-23 DIAGNOSIS — E785 Hyperlipidemia, unspecified: Secondary | ICD-10-CM | POA: Diagnosis not present

## 2024-01-23 DIAGNOSIS — G1220 Motor neuron disease, unspecified: Secondary | ICD-10-CM | POA: Diagnosis not present

## 2024-01-23 DIAGNOSIS — R531 Weakness: Secondary | ICD-10-CM | POA: Diagnosis not present

## 2024-01-23 DIAGNOSIS — G1221 Amyotrophic lateral sclerosis: Secondary | ICD-10-CM | POA: Diagnosis not present

## 2024-01-23 DIAGNOSIS — K219 Gastro-esophageal reflux disease without esophagitis: Secondary | ICD-10-CM | POA: Diagnosis not present

## 2024-01-23 DIAGNOSIS — N189 Chronic kidney disease, unspecified: Secondary | ICD-10-CM | POA: Diagnosis not present

## 2024-01-24 DIAGNOSIS — K219 Gastro-esophageal reflux disease without esophagitis: Secondary | ICD-10-CM | POA: Diagnosis not present

## 2024-01-24 DIAGNOSIS — G1220 Motor neuron disease, unspecified: Secondary | ICD-10-CM | POA: Diagnosis not present

## 2024-01-24 DIAGNOSIS — N189 Chronic kidney disease, unspecified: Secondary | ICD-10-CM | POA: Diagnosis not present

## 2024-01-24 DIAGNOSIS — E785 Hyperlipidemia, unspecified: Secondary | ICD-10-CM | POA: Diagnosis not present

## 2024-01-24 DIAGNOSIS — R531 Weakness: Secondary | ICD-10-CM | POA: Diagnosis not present

## 2024-01-24 DIAGNOSIS — G1221 Amyotrophic lateral sclerosis: Secondary | ICD-10-CM | POA: Diagnosis not present

## 2024-01-25 DIAGNOSIS — G1221 Amyotrophic lateral sclerosis: Secondary | ICD-10-CM | POA: Diagnosis not present

## 2024-01-25 DIAGNOSIS — Z66 Do not resuscitate: Secondary | ICD-10-CM | POA: Diagnosis not present

## 2024-01-25 DIAGNOSIS — G1220 Motor neuron disease, unspecified: Secondary | ICD-10-CM | POA: Diagnosis not present

## 2024-01-25 DIAGNOSIS — R531 Weakness: Secondary | ICD-10-CM | POA: Diagnosis not present

## 2024-01-25 DIAGNOSIS — K219 Gastro-esophageal reflux disease without esophagitis: Secondary | ICD-10-CM | POA: Diagnosis not present

## 2024-01-25 DIAGNOSIS — J84115 Respiratory bronchiolitis interstitial lung disease: Secondary | ICD-10-CM | POA: Diagnosis not present

## 2024-01-25 DIAGNOSIS — N189 Chronic kidney disease, unspecified: Secondary | ICD-10-CM | POA: Diagnosis not present

## 2024-01-25 DIAGNOSIS — E785 Hyperlipidemia, unspecified: Secondary | ICD-10-CM | POA: Diagnosis not present

## 2024-01-27 DIAGNOSIS — E785 Hyperlipidemia, unspecified: Secondary | ICD-10-CM | POA: Diagnosis not present

## 2024-01-27 DIAGNOSIS — G1220 Motor neuron disease, unspecified: Secondary | ICD-10-CM | POA: Diagnosis not present

## 2024-01-27 DIAGNOSIS — R531 Weakness: Secondary | ICD-10-CM | POA: Diagnosis not present

## 2024-01-27 DIAGNOSIS — G1221 Amyotrophic lateral sclerosis: Secondary | ICD-10-CM | POA: Diagnosis not present

## 2024-01-27 DIAGNOSIS — K219 Gastro-esophageal reflux disease without esophagitis: Secondary | ICD-10-CM | POA: Diagnosis not present

## 2024-01-27 DIAGNOSIS — N189 Chronic kidney disease, unspecified: Secondary | ICD-10-CM | POA: Diagnosis not present

## 2024-01-29 DIAGNOSIS — G1220 Motor neuron disease, unspecified: Secondary | ICD-10-CM | POA: Diagnosis not present

## 2024-01-29 DIAGNOSIS — G1221 Amyotrophic lateral sclerosis: Secondary | ICD-10-CM | POA: Diagnosis not present

## 2024-01-29 DIAGNOSIS — E785 Hyperlipidemia, unspecified: Secondary | ICD-10-CM | POA: Diagnosis not present

## 2024-01-29 DIAGNOSIS — K219 Gastro-esophageal reflux disease without esophagitis: Secondary | ICD-10-CM | POA: Diagnosis not present

## 2024-01-29 DIAGNOSIS — R531 Weakness: Secondary | ICD-10-CM | POA: Diagnosis not present

## 2024-01-29 DIAGNOSIS — N189 Chronic kidney disease, unspecified: Secondary | ICD-10-CM | POA: Diagnosis not present

## 2024-01-30 DIAGNOSIS — K219 Gastro-esophageal reflux disease without esophagitis: Secondary | ICD-10-CM | POA: Diagnosis not present

## 2024-01-30 DIAGNOSIS — G1221 Amyotrophic lateral sclerosis: Secondary | ICD-10-CM | POA: Diagnosis not present

## 2024-01-30 DIAGNOSIS — N189 Chronic kidney disease, unspecified: Secondary | ICD-10-CM | POA: Diagnosis not present

## 2024-01-30 DIAGNOSIS — R531 Weakness: Secondary | ICD-10-CM | POA: Diagnosis not present

## 2024-01-30 DIAGNOSIS — G1220 Motor neuron disease, unspecified: Secondary | ICD-10-CM | POA: Diagnosis not present

## 2024-01-30 DIAGNOSIS — E785 Hyperlipidemia, unspecified: Secondary | ICD-10-CM | POA: Diagnosis not present

## 2024-01-31 DIAGNOSIS — R531 Weakness: Secondary | ICD-10-CM | POA: Diagnosis not present

## 2024-01-31 DIAGNOSIS — K219 Gastro-esophageal reflux disease without esophagitis: Secondary | ICD-10-CM | POA: Diagnosis not present

## 2024-01-31 DIAGNOSIS — N189 Chronic kidney disease, unspecified: Secondary | ICD-10-CM | POA: Diagnosis not present

## 2024-01-31 DIAGNOSIS — E785 Hyperlipidemia, unspecified: Secondary | ICD-10-CM | POA: Diagnosis not present

## 2024-01-31 DIAGNOSIS — G1221 Amyotrophic lateral sclerosis: Secondary | ICD-10-CM | POA: Diagnosis not present

## 2024-01-31 DIAGNOSIS — G1220 Motor neuron disease, unspecified: Secondary | ICD-10-CM | POA: Diagnosis not present

## 2024-02-02 DIAGNOSIS — G1221 Amyotrophic lateral sclerosis: Secondary | ICD-10-CM | POA: Diagnosis not present

## 2024-02-02 DIAGNOSIS — E785 Hyperlipidemia, unspecified: Secondary | ICD-10-CM | POA: Diagnosis not present

## 2024-02-02 DIAGNOSIS — K219 Gastro-esophageal reflux disease without esophagitis: Secondary | ICD-10-CM | POA: Diagnosis not present

## 2024-02-02 DIAGNOSIS — R531 Weakness: Secondary | ICD-10-CM | POA: Diagnosis not present

## 2024-02-02 DIAGNOSIS — N189 Chronic kidney disease, unspecified: Secondary | ICD-10-CM | POA: Diagnosis not present

## 2024-02-02 DIAGNOSIS — G1220 Motor neuron disease, unspecified: Secondary | ICD-10-CM | POA: Diagnosis not present

## 2024-02-03 DIAGNOSIS — K219 Gastro-esophageal reflux disease without esophagitis: Secondary | ICD-10-CM | POA: Diagnosis not present

## 2024-02-03 DIAGNOSIS — E785 Hyperlipidemia, unspecified: Secondary | ICD-10-CM | POA: Diagnosis not present

## 2024-02-03 DIAGNOSIS — G1220 Motor neuron disease, unspecified: Secondary | ICD-10-CM | POA: Diagnosis not present

## 2024-02-03 DIAGNOSIS — G1221 Amyotrophic lateral sclerosis: Secondary | ICD-10-CM | POA: Diagnosis not present

## 2024-02-03 DIAGNOSIS — R531 Weakness: Secondary | ICD-10-CM | POA: Diagnosis not present

## 2024-02-03 DIAGNOSIS — N189 Chronic kidney disease, unspecified: Secondary | ICD-10-CM | POA: Diagnosis not present

## 2024-02-04 DIAGNOSIS — G1220 Motor neuron disease, unspecified: Secondary | ICD-10-CM | POA: Diagnosis not present

## 2024-02-04 DIAGNOSIS — N189 Chronic kidney disease, unspecified: Secondary | ICD-10-CM | POA: Diagnosis not present

## 2024-02-04 DIAGNOSIS — G1221 Amyotrophic lateral sclerosis: Secondary | ICD-10-CM | POA: Diagnosis not present

## 2024-02-04 DIAGNOSIS — K219 Gastro-esophageal reflux disease without esophagitis: Secondary | ICD-10-CM | POA: Diagnosis not present

## 2024-02-04 DIAGNOSIS — E785 Hyperlipidemia, unspecified: Secondary | ICD-10-CM | POA: Diagnosis not present

## 2024-02-04 DIAGNOSIS — R531 Weakness: Secondary | ICD-10-CM | POA: Diagnosis not present

## 2024-02-05 DIAGNOSIS — N189 Chronic kidney disease, unspecified: Secondary | ICD-10-CM | POA: Diagnosis not present

## 2024-02-05 DIAGNOSIS — R531 Weakness: Secondary | ICD-10-CM | POA: Diagnosis not present

## 2024-02-05 DIAGNOSIS — K219 Gastro-esophageal reflux disease without esophagitis: Secondary | ICD-10-CM | POA: Diagnosis not present

## 2024-02-05 DIAGNOSIS — G1220 Motor neuron disease, unspecified: Secondary | ICD-10-CM | POA: Diagnosis not present

## 2024-02-05 DIAGNOSIS — G1221 Amyotrophic lateral sclerosis: Secondary | ICD-10-CM | POA: Diagnosis not present

## 2024-02-05 DIAGNOSIS — E785 Hyperlipidemia, unspecified: Secondary | ICD-10-CM | POA: Diagnosis not present

## 2024-02-06 DIAGNOSIS — R531 Weakness: Secondary | ICD-10-CM | POA: Diagnosis not present

## 2024-02-06 DIAGNOSIS — G1220 Motor neuron disease, unspecified: Secondary | ICD-10-CM | POA: Diagnosis not present

## 2024-02-06 DIAGNOSIS — G1221 Amyotrophic lateral sclerosis: Secondary | ICD-10-CM | POA: Diagnosis not present

## 2024-02-06 DIAGNOSIS — N189 Chronic kidney disease, unspecified: Secondary | ICD-10-CM | POA: Diagnosis not present

## 2024-02-06 DIAGNOSIS — K219 Gastro-esophageal reflux disease without esophagitis: Secondary | ICD-10-CM | POA: Diagnosis not present

## 2024-02-06 DIAGNOSIS — E785 Hyperlipidemia, unspecified: Secondary | ICD-10-CM | POA: Diagnosis not present

## 2024-02-07 DIAGNOSIS — N189 Chronic kidney disease, unspecified: Secondary | ICD-10-CM | POA: Diagnosis not present

## 2024-02-07 DIAGNOSIS — G1221 Amyotrophic lateral sclerosis: Secondary | ICD-10-CM | POA: Diagnosis not present

## 2024-02-07 DIAGNOSIS — E785 Hyperlipidemia, unspecified: Secondary | ICD-10-CM | POA: Diagnosis not present

## 2024-02-07 DIAGNOSIS — G1220 Motor neuron disease, unspecified: Secondary | ICD-10-CM | POA: Diagnosis not present

## 2024-02-07 DIAGNOSIS — R531 Weakness: Secondary | ICD-10-CM | POA: Diagnosis not present

## 2024-02-07 DIAGNOSIS — K219 Gastro-esophageal reflux disease without esophagitis: Secondary | ICD-10-CM | POA: Diagnosis not present

## 2024-02-10 DIAGNOSIS — E785 Hyperlipidemia, unspecified: Secondary | ICD-10-CM | POA: Diagnosis not present

## 2024-02-10 DIAGNOSIS — G1221 Amyotrophic lateral sclerosis: Secondary | ICD-10-CM | POA: Diagnosis not present

## 2024-02-10 DIAGNOSIS — R531 Weakness: Secondary | ICD-10-CM | POA: Diagnosis not present

## 2024-02-10 DIAGNOSIS — K219 Gastro-esophageal reflux disease without esophagitis: Secondary | ICD-10-CM | POA: Diagnosis not present

## 2024-02-10 DIAGNOSIS — G1220 Motor neuron disease, unspecified: Secondary | ICD-10-CM | POA: Diagnosis not present

## 2024-02-10 DIAGNOSIS — N189 Chronic kidney disease, unspecified: Secondary | ICD-10-CM | POA: Diagnosis not present

## 2024-02-12 DIAGNOSIS — E785 Hyperlipidemia, unspecified: Secondary | ICD-10-CM | POA: Diagnosis not present

## 2024-02-12 DIAGNOSIS — K219 Gastro-esophageal reflux disease without esophagitis: Secondary | ICD-10-CM | POA: Diagnosis not present

## 2024-02-12 DIAGNOSIS — N189 Chronic kidney disease, unspecified: Secondary | ICD-10-CM | POA: Diagnosis not present

## 2024-02-12 DIAGNOSIS — G1221 Amyotrophic lateral sclerosis: Secondary | ICD-10-CM | POA: Diagnosis not present

## 2024-02-12 DIAGNOSIS — R531 Weakness: Secondary | ICD-10-CM | POA: Diagnosis not present

## 2024-02-12 DIAGNOSIS — G1220 Motor neuron disease, unspecified: Secondary | ICD-10-CM | POA: Diagnosis not present

## 2024-02-14 DIAGNOSIS — N189 Chronic kidney disease, unspecified: Secondary | ICD-10-CM | POA: Diagnosis not present

## 2024-02-14 DIAGNOSIS — G1220 Motor neuron disease, unspecified: Secondary | ICD-10-CM | POA: Diagnosis not present

## 2024-02-14 DIAGNOSIS — G1221 Amyotrophic lateral sclerosis: Secondary | ICD-10-CM | POA: Diagnosis not present

## 2024-02-14 DIAGNOSIS — E785 Hyperlipidemia, unspecified: Secondary | ICD-10-CM | POA: Diagnosis not present

## 2024-02-14 DIAGNOSIS — K219 Gastro-esophageal reflux disease without esophagitis: Secondary | ICD-10-CM | POA: Diagnosis not present

## 2024-02-14 DIAGNOSIS — R531 Weakness: Secondary | ICD-10-CM | POA: Diagnosis not present

## 2024-02-17 DIAGNOSIS — G1221 Amyotrophic lateral sclerosis: Secondary | ICD-10-CM | POA: Diagnosis not present

## 2024-02-17 DIAGNOSIS — E785 Hyperlipidemia, unspecified: Secondary | ICD-10-CM | POA: Diagnosis not present

## 2024-02-17 DIAGNOSIS — R531 Weakness: Secondary | ICD-10-CM | POA: Diagnosis not present

## 2024-02-17 DIAGNOSIS — N189 Chronic kidney disease, unspecified: Secondary | ICD-10-CM | POA: Diagnosis not present

## 2024-02-17 DIAGNOSIS — G1220 Motor neuron disease, unspecified: Secondary | ICD-10-CM | POA: Diagnosis not present

## 2024-02-17 DIAGNOSIS — K219 Gastro-esophageal reflux disease without esophagitis: Secondary | ICD-10-CM | POA: Diagnosis not present

## 2024-02-19 DIAGNOSIS — E785 Hyperlipidemia, unspecified: Secondary | ICD-10-CM | POA: Diagnosis not present

## 2024-02-19 DIAGNOSIS — N189 Chronic kidney disease, unspecified: Secondary | ICD-10-CM | POA: Diagnosis not present

## 2024-02-19 DIAGNOSIS — R531 Weakness: Secondary | ICD-10-CM | POA: Diagnosis not present

## 2024-02-19 DIAGNOSIS — G1220 Motor neuron disease, unspecified: Secondary | ICD-10-CM | POA: Diagnosis not present

## 2024-02-19 DIAGNOSIS — G1221 Amyotrophic lateral sclerosis: Secondary | ICD-10-CM | POA: Diagnosis not present

## 2024-02-19 DIAGNOSIS — K219 Gastro-esophageal reflux disease without esophagitis: Secondary | ICD-10-CM | POA: Diagnosis not present

## 2024-02-21 DIAGNOSIS — G1221 Amyotrophic lateral sclerosis: Secondary | ICD-10-CM | POA: Diagnosis not present

## 2024-02-21 DIAGNOSIS — R531 Weakness: Secondary | ICD-10-CM | POA: Diagnosis not present

## 2024-02-21 DIAGNOSIS — N189 Chronic kidney disease, unspecified: Secondary | ICD-10-CM | POA: Diagnosis not present

## 2024-02-21 DIAGNOSIS — K219 Gastro-esophageal reflux disease without esophagitis: Secondary | ICD-10-CM | POA: Diagnosis not present

## 2024-02-21 DIAGNOSIS — G1220 Motor neuron disease, unspecified: Secondary | ICD-10-CM | POA: Diagnosis not present

## 2024-02-21 DIAGNOSIS — E785 Hyperlipidemia, unspecified: Secondary | ICD-10-CM | POA: Diagnosis not present

## 2024-02-22 DIAGNOSIS — R531 Weakness: Secondary | ICD-10-CM | POA: Diagnosis not present

## 2024-02-22 DIAGNOSIS — Z66 Do not resuscitate: Secondary | ICD-10-CM | POA: Diagnosis not present

## 2024-02-22 DIAGNOSIS — G1221 Amyotrophic lateral sclerosis: Secondary | ICD-10-CM | POA: Diagnosis not present

## 2024-02-22 DIAGNOSIS — K219 Gastro-esophageal reflux disease without esophagitis: Secondary | ICD-10-CM | POA: Diagnosis not present

## 2024-02-22 DIAGNOSIS — N189 Chronic kidney disease, unspecified: Secondary | ICD-10-CM | POA: Diagnosis not present

## 2024-02-22 DIAGNOSIS — G1220 Motor neuron disease, unspecified: Secondary | ICD-10-CM | POA: Diagnosis not present

## 2024-02-22 DIAGNOSIS — E785 Hyperlipidemia, unspecified: Secondary | ICD-10-CM | POA: Diagnosis not present

## 2024-02-22 DIAGNOSIS — J84115 Respiratory bronchiolitis interstitial lung disease: Secondary | ICD-10-CM | POA: Diagnosis not present

## 2024-02-24 DIAGNOSIS — G1221 Amyotrophic lateral sclerosis: Secondary | ICD-10-CM | POA: Diagnosis not present

## 2024-02-24 DIAGNOSIS — G1220 Motor neuron disease, unspecified: Secondary | ICD-10-CM | POA: Diagnosis not present

## 2024-02-24 DIAGNOSIS — N189 Chronic kidney disease, unspecified: Secondary | ICD-10-CM | POA: Diagnosis not present

## 2024-02-24 DIAGNOSIS — K219 Gastro-esophageal reflux disease without esophagitis: Secondary | ICD-10-CM | POA: Diagnosis not present

## 2024-02-24 DIAGNOSIS — R531 Weakness: Secondary | ICD-10-CM | POA: Diagnosis not present

## 2024-02-24 DIAGNOSIS — E785 Hyperlipidemia, unspecified: Secondary | ICD-10-CM | POA: Diagnosis not present

## 2024-02-25 DIAGNOSIS — G1221 Amyotrophic lateral sclerosis: Secondary | ICD-10-CM | POA: Diagnosis not present

## 2024-02-25 DIAGNOSIS — K219 Gastro-esophageal reflux disease without esophagitis: Secondary | ICD-10-CM | POA: Diagnosis not present

## 2024-02-25 DIAGNOSIS — G1220 Motor neuron disease, unspecified: Secondary | ICD-10-CM | POA: Diagnosis not present

## 2024-02-25 DIAGNOSIS — R531 Weakness: Secondary | ICD-10-CM | POA: Diagnosis not present

## 2024-02-25 DIAGNOSIS — N189 Chronic kidney disease, unspecified: Secondary | ICD-10-CM | POA: Diagnosis not present

## 2024-02-25 DIAGNOSIS — E785 Hyperlipidemia, unspecified: Secondary | ICD-10-CM | POA: Diagnosis not present

## 2024-02-26 DIAGNOSIS — N189 Chronic kidney disease, unspecified: Secondary | ICD-10-CM | POA: Diagnosis not present

## 2024-02-26 DIAGNOSIS — G1221 Amyotrophic lateral sclerosis: Secondary | ICD-10-CM | POA: Diagnosis not present

## 2024-02-26 DIAGNOSIS — G1220 Motor neuron disease, unspecified: Secondary | ICD-10-CM | POA: Diagnosis not present

## 2024-02-26 DIAGNOSIS — K219 Gastro-esophageal reflux disease without esophagitis: Secondary | ICD-10-CM | POA: Diagnosis not present

## 2024-02-26 DIAGNOSIS — R531 Weakness: Secondary | ICD-10-CM | POA: Diagnosis not present

## 2024-02-26 DIAGNOSIS — E785 Hyperlipidemia, unspecified: Secondary | ICD-10-CM | POA: Diagnosis not present

## 2024-02-27 DIAGNOSIS — G1221 Amyotrophic lateral sclerosis: Secondary | ICD-10-CM | POA: Diagnosis not present

## 2024-02-27 DIAGNOSIS — E785 Hyperlipidemia, unspecified: Secondary | ICD-10-CM | POA: Diagnosis not present

## 2024-02-27 DIAGNOSIS — N189 Chronic kidney disease, unspecified: Secondary | ICD-10-CM | POA: Diagnosis not present

## 2024-02-27 DIAGNOSIS — K219 Gastro-esophageal reflux disease without esophagitis: Secondary | ICD-10-CM | POA: Diagnosis not present

## 2024-02-27 DIAGNOSIS — R531 Weakness: Secondary | ICD-10-CM | POA: Diagnosis not present

## 2024-02-27 DIAGNOSIS — G1220 Motor neuron disease, unspecified: Secondary | ICD-10-CM | POA: Diagnosis not present

## 2024-02-28 DIAGNOSIS — R531 Weakness: Secondary | ICD-10-CM | POA: Diagnosis not present

## 2024-02-28 DIAGNOSIS — K219 Gastro-esophageal reflux disease without esophagitis: Secondary | ICD-10-CM | POA: Diagnosis not present

## 2024-02-28 DIAGNOSIS — E785 Hyperlipidemia, unspecified: Secondary | ICD-10-CM | POA: Diagnosis not present

## 2024-02-28 DIAGNOSIS — G1221 Amyotrophic lateral sclerosis: Secondary | ICD-10-CM | POA: Diagnosis not present

## 2024-02-28 DIAGNOSIS — G1220 Motor neuron disease, unspecified: Secondary | ICD-10-CM | POA: Diagnosis not present

## 2024-02-28 DIAGNOSIS — N189 Chronic kidney disease, unspecified: Secondary | ICD-10-CM | POA: Diagnosis not present

## 2024-02-29 DIAGNOSIS — G1221 Amyotrophic lateral sclerosis: Secondary | ICD-10-CM | POA: Diagnosis not present

## 2024-02-29 DIAGNOSIS — G1220 Motor neuron disease, unspecified: Secondary | ICD-10-CM | POA: Diagnosis not present

## 2024-02-29 DIAGNOSIS — R531 Weakness: Secondary | ICD-10-CM | POA: Diagnosis not present

## 2024-02-29 DIAGNOSIS — N189 Chronic kidney disease, unspecified: Secondary | ICD-10-CM | POA: Diagnosis not present

## 2024-02-29 DIAGNOSIS — K219 Gastro-esophageal reflux disease without esophagitis: Secondary | ICD-10-CM | POA: Diagnosis not present

## 2024-02-29 DIAGNOSIS — E785 Hyperlipidemia, unspecified: Secondary | ICD-10-CM | POA: Diagnosis not present

## 2024-03-01 DIAGNOSIS — R531 Weakness: Secondary | ICD-10-CM | POA: Diagnosis not present

## 2024-03-01 DIAGNOSIS — E785 Hyperlipidemia, unspecified: Secondary | ICD-10-CM | POA: Diagnosis not present

## 2024-03-01 DIAGNOSIS — G1220 Motor neuron disease, unspecified: Secondary | ICD-10-CM | POA: Diagnosis not present

## 2024-03-01 DIAGNOSIS — K219 Gastro-esophageal reflux disease without esophagitis: Secondary | ICD-10-CM | POA: Diagnosis not present

## 2024-03-01 DIAGNOSIS — G1221 Amyotrophic lateral sclerosis: Secondary | ICD-10-CM | POA: Diagnosis not present

## 2024-03-01 DIAGNOSIS — N189 Chronic kidney disease, unspecified: Secondary | ICD-10-CM | POA: Diagnosis not present

## 2024-03-02 DIAGNOSIS — G1221 Amyotrophic lateral sclerosis: Secondary | ICD-10-CM | POA: Diagnosis not present

## 2024-03-02 DIAGNOSIS — G1220 Motor neuron disease, unspecified: Secondary | ICD-10-CM | POA: Diagnosis not present

## 2024-03-02 DIAGNOSIS — N189 Chronic kidney disease, unspecified: Secondary | ICD-10-CM | POA: Diagnosis not present

## 2024-03-02 DIAGNOSIS — R531 Weakness: Secondary | ICD-10-CM | POA: Diagnosis not present

## 2024-03-02 DIAGNOSIS — E785 Hyperlipidemia, unspecified: Secondary | ICD-10-CM | POA: Diagnosis not present

## 2024-03-02 DIAGNOSIS — K219 Gastro-esophageal reflux disease without esophagitis: Secondary | ICD-10-CM | POA: Diagnosis not present

## 2024-03-04 DIAGNOSIS — G1221 Amyotrophic lateral sclerosis: Secondary | ICD-10-CM | POA: Diagnosis not present

## 2024-03-04 DIAGNOSIS — R531 Weakness: Secondary | ICD-10-CM | POA: Diagnosis not present

## 2024-03-04 DIAGNOSIS — N189 Chronic kidney disease, unspecified: Secondary | ICD-10-CM | POA: Diagnosis not present

## 2024-03-04 DIAGNOSIS — K219 Gastro-esophageal reflux disease without esophagitis: Secondary | ICD-10-CM | POA: Diagnosis not present

## 2024-03-04 DIAGNOSIS — G1220 Motor neuron disease, unspecified: Secondary | ICD-10-CM | POA: Diagnosis not present

## 2024-03-04 DIAGNOSIS — E785 Hyperlipidemia, unspecified: Secondary | ICD-10-CM | POA: Diagnosis not present

## 2024-03-05 DIAGNOSIS — R531 Weakness: Secondary | ICD-10-CM | POA: Diagnosis not present

## 2024-03-05 DIAGNOSIS — K219 Gastro-esophageal reflux disease without esophagitis: Secondary | ICD-10-CM | POA: Diagnosis not present

## 2024-03-05 DIAGNOSIS — N189 Chronic kidney disease, unspecified: Secondary | ICD-10-CM | POA: Diagnosis not present

## 2024-03-05 DIAGNOSIS — E785 Hyperlipidemia, unspecified: Secondary | ICD-10-CM | POA: Diagnosis not present

## 2024-03-05 DIAGNOSIS — G1220 Motor neuron disease, unspecified: Secondary | ICD-10-CM | POA: Diagnosis not present

## 2024-03-05 DIAGNOSIS — G1221 Amyotrophic lateral sclerosis: Secondary | ICD-10-CM | POA: Diagnosis not present

## 2024-03-06 DIAGNOSIS — K219 Gastro-esophageal reflux disease without esophagitis: Secondary | ICD-10-CM | POA: Diagnosis not present

## 2024-03-06 DIAGNOSIS — N189 Chronic kidney disease, unspecified: Secondary | ICD-10-CM | POA: Diagnosis not present

## 2024-03-06 DIAGNOSIS — R531 Weakness: Secondary | ICD-10-CM | POA: Diagnosis not present

## 2024-03-06 DIAGNOSIS — E785 Hyperlipidemia, unspecified: Secondary | ICD-10-CM | POA: Diagnosis not present

## 2024-03-06 DIAGNOSIS — G1221 Amyotrophic lateral sclerosis: Secondary | ICD-10-CM | POA: Diagnosis not present

## 2024-03-06 DIAGNOSIS — G1220 Motor neuron disease, unspecified: Secondary | ICD-10-CM | POA: Diagnosis not present

## 2024-03-24 DEATH — deceased
